# Patient Record
Sex: Male | Born: 1960 | Race: White | Hispanic: No | Marital: Married | Smoking: Never smoker
Health system: Southern US, Community
[De-identification: ages and names within clinical notes are randomized; demographics above are authoritative.]

## PROBLEM LIST (undated history)

## (undated) DIAGNOSIS — N209 Urinary calculus, unspecified: Secondary | ICD-10-CM

## (undated) DIAGNOSIS — J302 Other seasonal allergic rhinitis: Secondary | ICD-10-CM

## (undated) DIAGNOSIS — E785 Hyperlipidemia, unspecified: Secondary | ICD-10-CM

## (undated) DIAGNOSIS — I10 Essential (primary) hypertension: Secondary | ICD-10-CM

## (undated) DIAGNOSIS — G4730 Sleep apnea, unspecified: Secondary | ICD-10-CM

## (undated) DIAGNOSIS — M545 Low back pain, unspecified: Secondary | ICD-10-CM

## (undated) DIAGNOSIS — T7840XA Allergy, unspecified, initial encounter: Secondary | ICD-10-CM

## (undated) DIAGNOSIS — L723 Sebaceous cyst: Secondary | ICD-10-CM

## (undated) DIAGNOSIS — N200 Calculus of kidney: Secondary | ICD-10-CM

## (undated) DIAGNOSIS — T884XXA Failed or difficult intubation, initial encounter: Secondary | ICD-10-CM

## (undated) DIAGNOSIS — R7303 Prediabetes: Secondary | ICD-10-CM

## (undated) DIAGNOSIS — Z98811 Dental restoration status: Secondary | ICD-10-CM

## (undated) HISTORY — DX: Essential (primary) hypertension: I10

## (undated) HISTORY — DX: Prediabetes: R73.03

## (undated) HISTORY — DX: Allergy, unspecified, initial encounter: T78.40XA

## (undated) HISTORY — PX: COLONOSCOPY: SHX174

## (undated) HISTORY — PX: POLYPECTOMY: SHX149

## (undated) HISTORY — DX: Low back pain: M54.5

## (undated) HISTORY — PX: OTHER SURGICAL HISTORY: SHX169

## (undated) HISTORY — DX: Urinary calculus, unspecified: N20.9

## (undated) HISTORY — PX: CYST EXCISION: SHX5701

## (undated) HISTORY — PX: ADENOIDECTOMY: SHX5191

## (undated) HISTORY — DX: Low back pain, unspecified: M54.50

## (undated) HISTORY — DX: Calculus of kidney: N20.0

---

## 2000-11-10 ENCOUNTER — Ambulatory Visit (HOSPITAL_COMMUNITY): Admission: RE | Admit: 2000-11-10 | Discharge: 2000-11-10 | Payer: Self-pay | Admitting: *Deleted

## 2001-03-30 ENCOUNTER — Ambulatory Visit (HOSPITAL_BASED_OUTPATIENT_CLINIC_OR_DEPARTMENT_OTHER): Admission: RE | Admit: 2001-03-30 | Discharge: 2001-03-30 | Payer: Self-pay | Admitting: Internal Medicine

## 2005-01-09 ENCOUNTER — Encounter: Admission: RE | Admit: 2005-01-09 | Discharge: 2005-04-09 | Payer: Self-pay | Admitting: Family Medicine

## 2006-12-27 ENCOUNTER — Ambulatory Visit: Payer: Self-pay | Admitting: Family Medicine

## 2006-12-27 LAB — CONVERTED CEMR LAB
Cholesterol: 186 mg/dL (ref 0–200)
Glucose, Bld: 95 mg/dL (ref 70–99)
HDL: 39 mg/dL (ref >39.0)
LDL Cholesterol: 119 mg/dL — ABNORMAL HIGH (ref 0–99)

## 2008-02-21 ENCOUNTER — Telehealth (INDEPENDENT_AMBULATORY_CARE_PROVIDER_SITE_OTHER): Payer: Self-pay | Admitting: *Deleted

## 2008-04-26 ENCOUNTER — Encounter: Payer: Self-pay | Admitting: Internal Medicine

## 2008-05-03 ENCOUNTER — Ambulatory Visit: Payer: Self-pay | Admitting: Internal Medicine

## 2008-05-03 DIAGNOSIS — K649 Unspecified hemorrhoids: Secondary | ICD-10-CM | POA: Insufficient documentation

## 2008-05-22 ENCOUNTER — Encounter: Payer: Self-pay | Admitting: Internal Medicine

## 2008-05-23 ENCOUNTER — Telehealth: Payer: Self-pay | Admitting: Internal Medicine

## 2008-06-05 ENCOUNTER — Ambulatory Visit: Payer: Self-pay | Admitting: Internal Medicine

## 2008-06-05 DIAGNOSIS — E785 Hyperlipidemia, unspecified: Secondary | ICD-10-CM | POA: Insufficient documentation

## 2008-06-05 DIAGNOSIS — G4733 Obstructive sleep apnea (adult) (pediatric): Secondary | ICD-10-CM | POA: Insufficient documentation

## 2008-06-05 DIAGNOSIS — F329 Major depressive disorder, single episode, unspecified: Secondary | ICD-10-CM

## 2008-06-07 ENCOUNTER — Ambulatory Visit: Payer: Self-pay | Admitting: Internal Medicine

## 2008-06-11 ENCOUNTER — Encounter (INDEPENDENT_AMBULATORY_CARE_PROVIDER_SITE_OTHER): Payer: Self-pay | Admitting: *Deleted

## 2008-06-11 LAB — CONVERTED CEMR LAB
ALT: 24 units/L (ref 0–53)
AST: 19 units/L (ref 0–37)
Basophils Absolute: 0 10*3/uL (ref 0.0–0.1)
CO2: 30 meq/L (ref 19–32)
Chloride: 105 meq/L (ref 96–112)
Cholesterol: 225 mg/dL (ref 0–200)
HDL: 37.1 mg/dL — ABNORMAL LOW (ref >39.0)
Lymphocytes Relative: 34.1 % (ref 12.0–46.0)
MCHC: 33.9 g/dL (ref 30.0–36.0)
Neutrophils Relative %: 53.9 % (ref 43.0–77.0)
Potassium: 4.5 meq/L (ref 3.5–5.1)
RBC: 5.01 M/uL (ref 4.22–5.81)
RDW: 12.8 % (ref 11.5–14.6)
Sodium: 141 meq/L (ref 135–145)
TSH: 1.43 microintl units/mL (ref 0.35–5.50)
VLDL: 51 mg/dL — ABNORMAL HIGH (ref 0–40)

## 2008-06-15 ENCOUNTER — Ambulatory Visit: Payer: Self-pay | Admitting: *Deleted

## 2008-06-29 ENCOUNTER — Ambulatory Visit: Payer: Self-pay | Admitting: *Deleted

## 2008-07-04 ENCOUNTER — Encounter: Payer: Self-pay | Admitting: Internal Medicine

## 2008-07-06 ENCOUNTER — Ambulatory Visit: Payer: Self-pay | Admitting: *Deleted

## 2008-07-13 ENCOUNTER — Ambulatory Visit: Payer: Self-pay | Admitting: *Deleted

## 2008-07-27 ENCOUNTER — Ambulatory Visit: Payer: Self-pay | Admitting: *Deleted

## 2008-07-30 ENCOUNTER — Ambulatory Visit: Payer: Self-pay | Admitting: Internal Medicine

## 2008-08-03 ENCOUNTER — Ambulatory Visit: Payer: Self-pay | Admitting: *Deleted

## 2008-08-10 ENCOUNTER — Ambulatory Visit: Payer: Self-pay | Admitting: *Deleted

## 2008-08-24 ENCOUNTER — Ambulatory Visit: Payer: Self-pay | Admitting: *Deleted

## 2008-08-31 ENCOUNTER — Ambulatory Visit: Payer: Self-pay | Admitting: *Deleted

## 2008-09-07 ENCOUNTER — Ambulatory Visit: Payer: Self-pay | Admitting: *Deleted

## 2008-09-24 ENCOUNTER — Ambulatory Visit: Payer: Self-pay | Admitting: Internal Medicine

## 2009-02-09 ENCOUNTER — Ambulatory Visit: Payer: Self-pay | Admitting: Internal Medicine

## 2009-02-09 LAB — CONVERTED CEMR LAB: Rapid Strep: NEGATIVE

## 2010-03-25 ENCOUNTER — Ambulatory Visit: Payer: Self-pay | Admitting: Internal Medicine

## 2010-08-22 ENCOUNTER — Ambulatory Visit: Payer: Self-pay | Admitting: Internal Medicine

## 2010-08-26 ENCOUNTER — Ambulatory Visit: Payer: Self-pay | Admitting: Internal Medicine

## 2010-08-29 ENCOUNTER — Ambulatory Visit: Payer: Self-pay | Admitting: Internal Medicine

## 2010-08-29 LAB — CONVERTED CEMR LAB
Eosinophils Relative: 2 % (ref 0.0–5.0)
HCT: 42.9 % (ref 39.0–52.0)
Lymphocytes Relative: 30.7 % (ref 12.0–46.0)
Lymphs Abs: 1.6 10*3/uL (ref 0.7–4.0)
Monocytes Relative: 8.5 % (ref 3.0–12.0)
Platelets: 279 10*3/uL (ref 150.0–400.0)
WBC: 5.3 10*3/uL (ref 4.5–10.5)

## 2010-09-02 LAB — CONVERTED CEMR LAB
AST: 17 units/L (ref 0–37)
CO2: 25 meq/L (ref 19–32)
GFR calc non Af Amer: 85.05 mL/min (ref >60)
Glucose, Bld: 101 mg/dL — ABNORMAL HIGH (ref 70–99)
Potassium: 4.7 meq/L (ref 3.5–5.1)
Sodium: 141 meq/L (ref 135–145)
TSH: 1.72 microintl units/mL (ref 0.35–5.50)
VLDL: 20.8 mg/dL (ref 0.0–40.0)

## 2010-12-17 ENCOUNTER — Ambulatory Visit
Admission: RE | Admit: 2010-12-17 | Discharge: 2010-12-17 | Payer: Self-pay | Source: Home / Self Care | Attending: Internal Medicine | Admitting: Internal Medicine

## 2010-12-17 DIAGNOSIS — H6590 Unspecified nonsuppurative otitis media, unspecified ear: Secondary | ICD-10-CM | POA: Insufficient documentation

## 2011-01-06 NOTE — Assessment & Plan Note (Signed)
Summary: cpx/cbs   Vital Signs:  Patient profile:   50 year old male Height:      72 inches Weight:      256.13 pounds Pulse rate:   73 / minute Pulse rhythm:   regular BP sitting:   130 / 76  (left arm) Cuff size:   large  Vitals Entered By: Army Fossa CMA (August 22, 2010 1:43 PM) CC: CPX. not fasting Comments will get TD and flu shot  [facility] [address]    History of Present Illness: CPX  Start a diet program, supervised by a physician at another facility Taking HCG and phentermine doing well Has lost approximately 40 pounds He is also exercising more, daily, bikes during the weekends  Current Medications (verified): 1)  Cpap Supplies .... Dx 780.53 2)  Mvi, Fish Oil, Asa 81 3)  Fluticasone Propionate 50 Mcg/act Susp (Fluticasone Propionate) .[name].. 1 Spray Two Times A Day As Needed  Allergies (verified): No Known Drug Allergies  Past History:  Past Medical History: UNSPECIFIED SLEEP APNEA per pt: sleep study (Dr Shelle Iron)--- on CPAP  HEMORRHOIDS Hyperlipidemia Depression  Past Surgical History: Reviewed history from 06/05/2008 and no changes required. Vasectomy  Family History: Mother 51 - htn, cervical cancer Father 9 - renal dz Brother - leukemia kidney cancer-- F (surgery 2010)  DM - no CAD - no stroke - no colon Ca - m. aunt prostate Ca - no  Social History: married 3 children tobacco--no ETOH-- socially diet exercise-- see HPI Occupation: Chartered loss adjuster, [employer]  Occupation:  employed  Review of Systems CV:  Denies chest pain or discomfort and swelling of feet. Resp:  Denies cough and shortness of breath. GI:  Denies bloody stools, diarrhea, nausea, and vomiting. GU:  Denies dysuria, hematuria, urinary frequency, and urinary hesitancy. Psych:  Denies anxiety and depression.  Physical Exam  General:  alert, well-developed, and well-nourished.   Lungs:  normal respiratory effort, no intercostal retractions, no  accessory muscle use, and normal breath sounds.   Heart:  normal rate, regular rhythm, no murmur, and no gallop.   Abdomen:  soft, non-tender, no distention, no masses, no guarding, and no rigidity.   Extremities:  no edema Psych:  Cognition and judgment appear intact. Alert and cooperative with normal attention span and concentration.  not anxious appearing and not depressed appearing.     Impression & Recommendations:  Problem # 1:  HEALTH SCREENING (ICD-V70.0) Td today  Flu shot today  Mat. Aunt had colon Ca.Marland KitchenMarland KitchenMarland KitchenColonoscopy: aprox  12/07/2002,  normal . Next at age 25 per pt  praised for his change in diet and increase exercise Labs  Complete Medication List: 1)  Cpap Supplies  .... Dx 780.53 2)  Mvi, Fish Oil, Asa 81  3)  Fluticasone Propionate 50 Mcg/act Susp (Fluticasone propionate) .[name].. 1 spray two times a day as needed  Other Orders: Tdap => 65yrs IM (19622) Admin 1st Vaccine (29798) Admin 1st Vaccine (92119) Flu Vaccine 35yrs + [phone])  Patient Instructions: 1)  come back fasting: 2)  FLP CBC BMP AST ALT TSH--- v70 3)  Please schedule a follow-up appointment in 1 year.    Immunizations Administered:  Tetanus Vaccine:    Vaccine Type: Tdap    Site: left deltoid    Mfr: GlaxoSmithKline    Dose: 0.5 ml    Route: IM    Given by: Army Fossa CMA    Exp. Date: 08/27/2012    Lot #: [id number] Flu Vaccine Consent Questions  Do you have a history of severe allergic reactions to this vaccine? no    Any prior history of allergic reactions to egg and/or gelatin? no    Do you have a sensitivity to the preservative Thimersol? no    Do you have a past history of Guillan-Barre Syndrome? no    Do you currently have an acute febrile illness? no    Have you ever had a severe reaction to latex? no    Vaccine information given and explained to patient? yes    Are you currently pregnant? no    Lot Number:[id number]   Exp Date:06/06/2011   Site Given  Right Deltoid IM:  [id number]   .lbflu

## 2011-01-06 NOTE — Assessment & Plan Note (Signed)
Summary: bronchitis//lch   Vital Signs:  Patient profile:   50 year old male Weight:      300 pounds BMI:     40.83 Temp:     98.3 degrees F oral Pulse rate:   64 / minute Resp:     15 per minute BP sitting:   126 / 78  (left arm) Cuff size:   large  Vitals Entered By: Shonna Chock (March 25, 2010 3:04 PM) CC: ? Bronchitis Comments REVIEWED MED LIST, PATIENT AGREED DOSE AND INSTRUCTION CORRECT    CC:  ? Bronchitis.  History of Present Illness: Onset 10-12 days ago as chest "cold" . After air travel to [place] it moved to head. Now major symptom is intermittently productive cough with yellow- brown - green  phlegm. Rx: Mucinex, Neti pot   Allergies (verified): No Known Drug Allergies  Review of Systems General:  Denies chills, fever, and sweats. ENT:  Complains of sinus pressure; No frontal headache & facial pain w/o purulence. Resp:  Complains of shortness of breath and wheezing; denies chest pain with inspiration and coughing up blood; No PMH of asthma; non smoker. Allergy:  Denies itching eyes and sneezing.  Physical Exam  General:  well-nourished,in no acute distress; alert,appropriate and cooperative throughout examination Ears:  External ear exam shows no significant lesions or deformities.  Otoscopic examination reveals clear canals, tympanic membranes are intact bilaterally without bulging, retraction, inflammation or discharge. Hearing is grossly normal bilaterally. Nose:  External nasal examination shows no deformity or inflammation. Nasal mucosa are  dry without lesions or exudates. Hyponasal speech Mouth:  Oral mucosa and oropharynx without lesions or exudates.  Teeth in good repair. No  pharyngeal erythema.  Slightly hoarse Lungs:  Normal respiratory effort, chest expands symmetrically. Lungs are clear to auscultation, no crackles or wheezes. Heart:  Normal rate and regular rhythm. S1 and S2 normal without gallop, murmur, click, rub.S4 Cervical Nodes:  No  lymphadenopathy noted Axillary Nodes:  No palpable lymphadenopathy   Impression & Recommendations:  Problem # 1:  BRONCHITIS-ACUTE (ICD-466.0)  The following medications were removed from the medication list:    Penicillin V Potassium 500 Mg Tabs (Penicillin v potassium) .[name]... 1 two times a day for strep infection His updated medication list for this problem includes:    Cefuroxime Axetil 500 Mg Tabs (Cefuroxime axetil) .[name]... 1 two times a day    Promethazine Vc/codeine 6.25-5-10 Mg/28ml Syrp (Phenyleph-promethazine-cod) .[name]... 1 tsp every 6 hrs as needed  Problem # 2:  URI (ICD-465.9)  His updated medication list for this problem includes:    Promethazine Vc/codeine 6.25-5-10 Mg/28ml Syrp (Phenyleph-promethazine-cod) .[name]... 1 tsp every 6 hrs as needed  Complete Medication List: 1)  Cpap Supplies  .... Dx 780.53 2)  Mvi, Fish Oil, Asa 81  3)  Cefuroxime Axetil 500 Mg Tabs (Cefuroxime axetil) .[name].. 1 two times a day 4)  Promethazine Vc/codeine 6.25-5-10 Mg/64ml Syrp (Phenyleph-promethazine-cod) .[name].. 1 tsp every 6 hrs as needed  Patient Instructions: 1)  Continue Neti pot once daily until sinuses are clear. 2)  Drink as much fluid as you can tolerate for the next few days. Prescriptions: PROMETHAZINE VC/CODEINE 6.25-5-10 MG/5ML SYRP (PHENYLEPH-PROMETHAZINE-COD) 1 tsp every 6 hrs as needed  #120cc x 0   Entered and Authorized by:   Marga Melnick MD   Signed by:   Marga Melnick MD on 03/25/2010   Method used:   Printed then faxed to ...       [facility] [id number] (retail)  [address]       [place], [place]  [address]       Ph: [phone]       Fax: [phone]   RxID:   [id number] CEFUROXIME AXETIL 500 MG TABS (CEFUROXIME AXETIL) 1 two times a day  #20 x 0   Entered and Authorized by:   Marga Melnick MD   Signed by:   Marga Melnick MD on 03/25/2010   Method used:   Faxed to ...       [id number]*  (retail)       [address]       [place], [place]  [address]       Ph: [phone]       Fax: [phone]   RxID:   [id number]

## 2011-01-08 NOTE — Assessment & Plan Note (Signed)
Summary: clogged ear//lch   Vital Signs:  Patient profile:   50 year old male Weight:      264.13 pounds Pulse rate:   82 / minute Pulse rhythm:   regular BP sitting:   124 / 92  (left arm) Cuff size:   large  Vitals Entered By: Army Fossa CMA (December 17, 2010 1:52 PM) CC: Pt here c/o head cold last week, has been flying. Ear feels "plugged" up.  Comments no pain [facility] [address]    History of Present Illness: had a URI last week, symptoms slightly better today but he has a lot of right ear congestion.  ROS No fever No cough or chest congestion He still has a low of nasal discharge, brown in color Denies ear pain or ear discharge     Current Medications (verified): 1)  Cpap Supplies .... Dx 780.53 2)  Mvi, Fish Oil, Asa 81 3)  Fluticasone Propionate 50 Mcg/act Susp (Fluticasone Propionate) .[name].. 1 Spray Two Times A Day As Needed  Allergies (verified): No Known Drug Allergies  Past History:  Past Medical History: Reviewed history from 08/22/2010 and no changes required. UNSPECIFIED SLEEP APNEA per pt: sleep study (Dr Shelle Iron)--- on CPAP  HEMORRHOIDS Hyperlipidemia Depression  Past Surgical History: Reviewed history from 06/05/2008 and no changes required. Vasectomy  Physical Exam  General:  alert and well-developed.   Head:  face symmetric, not tender to palpation Ears:  R TMs slightly bulged, pink, + air fluid level and L TMs slightly bulged Nose:  moderately congested Mouth:  no redness or discharge Lungs:  normal respiratory effort, no intercostal retractions, no accessory muscle use, and normal breath sounds.     Impression & Recommendations:  Problem # 1:  OTITIS MEDIA, SEROUS (ICD-381.4) see instructions   Complete Medication List: 1)  Cpap Supplies  .... Dx 780.53 2)  Mvi, Fish Oil, Asa 81  3)  Fluticasone Propionate 50 Mcg/act Susp (Fluticasone propionate) .[name].. 1 spray two times a day as needed 4)  Amoxicillin 500 Mg Tabs (Amoxicillin)  .... 2 tablets twice a day 5)  Prednisone 10 Mg Tabs (Prednisone) .... 4 by mouth once daily x 2, 3x2,2x2,1x2  Patient Instructions: 1)  take the medications as prescribed 2)  Mucinex twice a day for one week 3)  Sudafed 10 mg -- behind the counter-- one tablets 3 times a day for 3 or 4 days 4)  Call i  if no better next week Prescriptions: PREDNISONE 10 MG TABS (PREDNISONE) 4 by mouth once daily x 2, 3x2,2x2,1x2  #20 x 0   Entered and Authorized by:   Elita Quick E. Jaryan Chicoine MD   Signed by:   Nolon Rod. Denzel Etienne MD on 12/17/2010   Method used:   Print then Give to Patient   RxID:   [id number] AMOXICILLIN 500 MG TABS (AMOXICILLIN) 2 tablets twice a day  #28 x 0   Entered and Authorized by:   Nolon Rod. Meeya Goldin MD   Signed by:   Nolon Rod. Bandon Sherwin MD on 12/17/2010   Method used:   Print then Give to Patient   RxID:   [id number]    Orders Added: 1)  Est. Patient Level III [15176]

## 2011-04-24 NOTE — Procedures (Signed)
[place]  Patient:    DENIM, KALMBACH                    MRN: [id number] Proc. Date: 11/10/00 Adm. Date:  [id number] Attending:  Sabino Gasser                           Procedure Report  PROCEDURE PERFORMED:  Colonoscopy.  ENDOSCOPIST:  Sabino Gasser, M.D.  INDICATIONS FOR PROCEDURE:  Rectal bleeding, strong family history of colon cancer.  ANESTHESIA:  Demerol 75 mg, Versed 10 mg.  DESCRIPTION OF PROCEDURE:  With the patient mildly sedated in the left lateral decubitus position, the Olympus videoscopic colonoscope was inserted in the rectum after normal rectal examination and passed under direct vision into the cecum.  The cecum was identified by the ileocecal valve and appendiceal orifice, both of which were photographed.  The prep was slightly suboptimal in that there were areas of liquid stool with semisolid material that appeared to be corn kernels that could not be fully suctioned and therefore, we did the best we could at clearing the colon of the fecal debris.  From this point, the colonoscope was slowly withdrawn, taking circumferential views of the entire colonic mucosa stopping in the rectum, which appeared normal on direct and retroflex view.  The endoscope was straightened and withdrawn.  Patients vital signs and pulse oximeter remained stable.  The patient tolerated the procedure well and without apparent complications.  FINDINGS:  Essentially normal colonoscopic examination to cecum.  PLAN:  Repeat examination in approximately five years. DD:  11/10/00 TD:  11/10/00 Job: [id number]

## 2011-04-24 NOTE — Assessment & Plan Note (Signed)
[facility]                        [facility] [facility] OFFICE NOTE   NAME:Bryan [name], Bryan [name]                      MRN:          [id number]  DATE:12/27/2006                            DOB:          [birth year]    REASON FOR VISIT:  Establish care.   HISTORY OF PRESENT ILLNESS:  Mr. Morison is a 50 year old male who is  establishing care from [facility].  He has no  significant complaints today.  He appears to have a history of  hyperlipidemia and allergies.  He has been on Lescol XL 80 mg daily  until the beginning of the year.  He reports that he has changed his  lifestyle significantly.  He is working out 5-6 times a week, about an  hour a day.  He has lost about 18 pounds since the beginning of the  year.  He wants to continue being off Lescol if at all possible.  He  would like his lipid profile checked today to see if that is a  possibility.  He feels that if it is borderline or low, he would prefer  to stay off the medications.   The patient also has a small lump at the base of his neck that he wanted  me to take a look at.  He has a history of a sebaceous cyst, he just  wanted to make sure that it was not concerning.  It is not symptomatic  in nature.  He states it has not grown in size.   PAST MEDICAL HISTORY:  1. Hyperlipidemia.  2. Allergic rhinitis.   PAST SURGICAL HISTORY:  1. Vasectomy on February 17, 2005.   MEDICATIONS:  None, but use of Lescol XL 80 mg daily.   ALLERGIES:  No drug allergies.   FAMILY HISTORY:  Father alive with no significant medical history.  Mother alive with history of hypertension and uterine cancer.  Brother  alive with history of ischemia.  He has an aunt on mother's side that  has colon cancer.   SOCIAL HISTORY:  The patient is a nonsmoker and drinks alcohol rarely.  He is a Environmental consultant, married.   REVIEW OF SYSTEMS:  As per HPI.  Otherwise, unremarkable.   HEALTH MAINTENANCE:  He  had a colonoscopy in 2000 given his family  history that was negative per patient.   OBJECTIVE:  VITAL SIGNS:  Weight 283, pulse 76, blood pressure 126/80.  GENERAL:  Pleasant, no acute distress.  Pleasant and appropriate.  Alert  and oriented x3.  NECK:  Supple.  No lymphadenopathy, carotid bruits or JVD.  He does have  a 1-1/2 2 cm subcutaneous cystic nodule at the base of the neck, easily  movable.  LUNGS:  Clear.  HEART:  Regular rate and rhythm, normal S1, S2, no murmurs, gallops,  rubs.   IMPRESSION:  1. Hyperlipidemia, currently diet controlled with exercise.  2. Subcutaneous nodules consistent with an epidermal or sebaceous      cyst.   PLAN:  1. Congratulate the patient with lifestyle changes.  I advise patient      to  continue.  2. Will check a lipid profile, AST and ALT as well as a fasting      glucose.  3. Further recommendations after we review the above.  4. In regards to the subcutaneous nodule, it appears benign, but to      have 100% certainty, excision would have to be done.  The patient      prefers to monitor for change in size or pain.  5. Patient to follow up in June for complete physical examination.     Leanne Chang, M.D.  Electronically Signed    LA/MedQ  DD: 12/27/2006  DT: 12/27/2006  Job #: [id number]

## 2012-01-11 ENCOUNTER — Inpatient Hospital Stay (HOSPITAL_BASED_OUTPATIENT_CLINIC_OR_DEPARTMENT_OTHER)
Admission: EM | Admit: 2012-01-11 | Discharge: 2012-01-14 | DRG: 494 | Disposition: A | Discharge status: Home / Self Care | Payer: BC Managed Care – PPO | Attending: Surgery | Admitting: Surgery

## 2012-01-11 ENCOUNTER — Emergency Department (INDEPENDENT_AMBULATORY_CARE_PROVIDER_SITE_OTHER): Payer: BC Managed Care – PPO

## 2012-01-11 ENCOUNTER — Encounter (INDEPENDENT_AMBULATORY_CARE_PROVIDER_SITE_OTHER): Payer: Self-pay | Admitting: General Surgery

## 2012-01-11 ENCOUNTER — Encounter (HOSPITAL_BASED_OUTPATIENT_CLINIC_OR_DEPARTMENT_OTHER): Payer: Self-pay

## 2012-01-11 DIAGNOSIS — K8000 Calculus of gallbladder with acute cholecystitis without obstruction: Principal | ICD-10-CM | POA: Diagnosis present

## 2012-01-11 DIAGNOSIS — E785 Hyperlipidemia, unspecified: Secondary | ICD-10-CM | POA: Diagnosis present

## 2012-01-11 DIAGNOSIS — K8020 Calculus of gallbladder without cholecystitis without obstruction: Secondary | ICD-10-CM

## 2012-01-11 DIAGNOSIS — R1013 Epigastric pain: Secondary | ICD-10-CM

## 2012-01-11 DIAGNOSIS — F329 Major depressive disorder, single episode, unspecified: Secondary | ICD-10-CM | POA: Diagnosis present

## 2012-01-11 DIAGNOSIS — G4730 Sleep apnea, unspecified: Secondary | ICD-10-CM | POA: Diagnosis present

## 2012-01-11 DIAGNOSIS — F3289 Other specified depressive episodes: Secondary | ICD-10-CM | POA: Diagnosis present

## 2012-01-11 DIAGNOSIS — R945 Abnormal results of liver function studies: Secondary | ICD-10-CM

## 2012-01-11 DIAGNOSIS — R1011 Right upper quadrant pain: Secondary | ICD-10-CM

## 2012-01-11 DIAGNOSIS — K7689 Other specified diseases of liver: Secondary | ICD-10-CM

## 2012-01-11 DIAGNOSIS — R112 Nausea with vomiting, unspecified: Secondary | ICD-10-CM

## 2012-01-11 HISTORY — DX: Hyperlipidemia, unspecified: E78.5

## 2012-01-11 HISTORY — DX: Sleep apnea, unspecified: G47.30

## 2012-01-11 LAB — COMPREHENSIVE METABOLIC PANEL
AST: 232 U/L — ABNORMAL HIGH (ref 0–37)
Albumin: 4.1 g/dL (ref 3.5–5.2)
Alkaline Phosphatase: 61 U/L (ref 39–117)
BUN: 13 mg/dL (ref 6–23)
CO2: 26 mEq/L (ref 19–32)
Chloride: 102 mEq/L (ref 96–112)
Creatinine, Ser: 0.9 mg/dL (ref 0.50–1.35)
GFR calc non Af Amer: >90 mL/min (ref >90)
Potassium: 3.8 mEq/L (ref 3.5–5.1)
Total Bilirubin: 1.1 mg/dL (ref 0.3–1.2)

## 2012-01-11 LAB — URINE MICROSCOPIC-ADD ON: RBC / HPF: NONE SEEN RBC/hpf (ref <3)

## 2012-01-11 LAB — URINALYSIS, ROUTINE W REFLEX MICROSCOPIC
Glucose, UA: NEGATIVE mg/dL
Hgb urine dipstick: NEGATIVE
Ketones, ur: NEGATIVE mg/dL
Protein, ur: 30 mg/dL — AB
Urobilinogen, UA: 1 mg/dL (ref 0.0–1.0)

## 2012-01-11 LAB — CBC
HCT: 41 % (ref 39.0–52.0)
MCV: 82 fL (ref 78.0–100.0)
RBC: 5 MIL/uL (ref 4.22–5.81)
RDW: 13.5 % (ref 11.5–15.5)
WBC: 8 10*3/uL (ref 4.0–10.5)

## 2012-01-11 MED ORDER — IOHEXOL 300 MG/ML  SOLN
100.0000 mL | Freq: Once | INTRAMUSCULAR | Status: AC | PRN
  Administered 2012-01-11: 100 mL via INTRAVENOUS

## 2012-01-11 MED ORDER — ONDANSETRON HCL 4 MG/2ML IJ SOLN
4.0000 mg | Freq: Once | INTRAMUSCULAR | Status: DC
  Filled 2012-01-11: qty 2

## 2012-01-11 MED ORDER — HYDROMORPHONE HCL PF 2 MG/ML IJ SOLN
2.0000 mg | Freq: Once | INTRAMUSCULAR | Status: AC
  Administered 2012-01-11: 2 mg via INTRAVENOUS
  Filled 2012-01-11: qty 1

## 2012-01-11 MED ORDER — ONDANSETRON HCL 4 MG/2ML IJ SOLN
4.0000 mg | Freq: Once | INTRAMUSCULAR | Status: AC
  Administered 2012-01-11: 4 mg via INTRAVENOUS

## 2012-01-11 MED ORDER — SODIUM CHLORIDE 0.9 % IV BOLUS (SEPSIS)
1000.0000 mL | Freq: Once | INTRAVENOUS | Status: AC
  Administered 2012-01-11: 1000 mL via INTRAVENOUS

## 2012-01-11 MED ORDER — MORPHINE SULFATE 4 MG/ML IJ SOLN
INTRAMUSCULAR | Status: AC
  Administered 2012-01-11: 4 mg via INTRAVENOUS
  Filled 2012-01-11: qty 1

## 2012-01-11 MED ORDER — HYDROMORPHONE HCL PF 1 MG/ML IJ SOLN
INTRAMUSCULAR | Status: AC
  Administered 2012-01-11: 1 mg via INTRAVENOUS
  Filled 2012-01-11: qty 1

## 2012-01-11 MED ORDER — ONDANSETRON HCL 4 MG/2ML IJ SOLN
INTRAMUSCULAR | Status: AC
  Filled 2012-01-11: qty 2

## 2012-01-11 MED ORDER — MORPHINE SULFATE 2 MG/ML IJ SOLN
2.0000 mg | INTRAMUSCULAR | Status: DC | PRN
  Administered 2012-01-11: 2 mg via INTRAVENOUS
  Administered 2012-01-12: 4 mg via INTRAVENOUS
  Administered 2012-01-12: 2 mg via INTRAVENOUS
  Administered 2012-01-12: 4 mg via INTRAVENOUS
  Filled 2012-01-11: qty 2
  Filled 2012-01-11 (×2): qty 1
  Filled 2012-01-11: qty 2

## 2012-01-11 MED ORDER — HYDROMORPHONE HCL PF 1 MG/ML IJ SOLN
1.0000 mg | Freq: Once | INTRAMUSCULAR | Status: AC
  Administered 2012-01-11: 1 mg via INTRAVENOUS
  Filled 2012-01-11: qty 1

## 2012-01-11 MED ORDER — ONDANSETRON HCL 4 MG/2ML IJ SOLN
4.0000 mg | Freq: Four times a day (QID) | INTRAMUSCULAR | Status: DC | PRN
  Administered 2012-01-11 – 2012-01-13 (×5): 4 mg via INTRAVENOUS
  Filled 2012-01-11 (×5): qty 2

## 2012-01-11 MED ORDER — SODIUM CHLORIDE 0.9 % IV BOLUS (SEPSIS)
500.0000 mL | Freq: Once | INTRAVENOUS | Status: AC
  Administered 2012-01-11: 1000 mL via INTRAVENOUS

## 2012-01-11 MED ORDER — MORPHINE SULFATE 4 MG/ML IJ SOLN
4.0000 mg | Freq: Once | INTRAMUSCULAR | Status: AC
  Administered 2012-01-11: 4 mg via INTRAVENOUS

## 2012-01-11 MED ORDER — SODIUM CHLORIDE 0.9 % IV SOLN
1.0000 g | INTRAVENOUS | Status: DC
  Administered 2012-01-12 – 2012-01-14 (×3): 1 g via INTRAVENOUS
  Filled 2012-01-11 (×4): qty 1

## 2012-01-11 MED ORDER — HYDROMORPHONE HCL PF 1 MG/ML IJ SOLN
1.0000 mg | Freq: Once | INTRAMUSCULAR | Status: AC
  Administered 2012-01-11: 1 mg via INTRAVENOUS

## 2012-01-11 MED ORDER — POTASSIUM CHLORIDE IN NACL 20-0.9 MEQ/L-% IV SOLN
INTRAVENOUS | Status: DC
  Administered 2012-01-11: via INTRAVENOUS
  Administered 2012-01-12: 125 mL/h via INTRAVENOUS
  Filled 2012-01-11 (×5): qty 1000

## 2012-01-11 MED ORDER — HYDROMORPHONE HCL PF 1 MG/ML IJ SOLN
1.0000 mg | Freq: Once | INTRAMUSCULAR | Status: AC
  Administered 2012-01-11 (×2): 1 mg via INTRAVENOUS
  Filled 2012-01-11: qty 1

## 2012-01-11 MED ORDER — PANTOPRAZOLE SODIUM 40 MG IV SOLR
40.0000 mg | Freq: Every day | INTRAVENOUS | Status: DC
  Administered 2012-01-12 – 2012-01-13 (×2): 40 mg via INTRAVENOUS
  Filled 2012-01-11 (×4): qty 40

## 2012-01-11 MED ORDER — IOHEXOL 300 MG/ML  SOLN
20.0000 mL | INTRAMUSCULAR | Status: DC | PRN
  Administered 2012-01-11: 20 mL via ORAL

## 2012-01-11 NOTE — ED Notes (Signed)
Pt reports nausea after CT scan and vomited emesis.  States he feels better now.

## 2012-01-11 NOTE — ED Notes (Signed)
Pt presenting to ed with c/o abdominal pain, nausea and vomiting pt presenting to ed via [facility] from [facility]. Per [facility] pt coming to [place] for hiata scan. Pt is alert and oriented at this time.

## 2012-01-11 NOTE — ED Notes (Signed)
PO fluids provided. 

## 2012-01-11 NOTE — H&P (Signed)
Bryan [name] is an 51 y.o. male.   Chief Complaint: abdominal pain HPI: patient is a 51 year old male in his usual state of health until late last night when he was awakened with sudden onset of pain across his upper abdomen. He describes pressure-like or cramping pain across his epigastrium and in both upper quadrants.  he took some over-the-counter pain medication and it improved but then he awoke again at 4 this morning with recurrent severe pain and vomited several times. He presented to the [place] emergency room. He has received pain medication which has improved her pain but not completely relieved. He is no longer nauseated but does not feel hungry. He denies any history of any similar episodes or chronic abdominal pain or GI complaints. no fever chills.  No jaundice noted and no urinary symptoms.  Past Medical History  Diagnosis Date  . Hyperlipidemia   . Depression   . Sleep apnea     CPAP    History reviewed. No pertinent past surgical history.  No family history on file. Social History:  reports that he has never smoked. He does not have any smokeless tobacco history on file. He reports that he drinks alcohol. He reports that he does not use illicit drugs.  Allergies: No Known Allergies  Medications Prior to Admission  Medication Dose Route Frequency Provider Last Rate Last Dose  . HYDROmorphone (DILAUDID) injection 1 mg  1 mg Intravenous Once [employer], PA-C   1 mg at 01/11/12 1025  . HYDROmorphone (DILAUDID) injection 1 mg  1 mg Intravenous Once [employer], PA-C      . HYDROmorphone (DILAUDID) injection 1 mg  1 mg Intravenous Once Dayton Bailiff, MD   1 mg at 01/11/12 1821  . HYDROmorphone (DILAUDID) injection 2 mg  2 mg Intravenous Once Shaaron Adler, PA-C   2 mg at 01/11/12 1309  . iohexol (OMNIPAQUE) 300 MG/ML solution 100 mL  100 mL Intravenous Once PRN Medication Radiologist, MD   100 mL at 01/11/12 1402  . iohexol (OMNIPAQUE) 300  MG/ML solution 20 mL  20 mL Oral PRN Medication Radiologist, MD   20 mL at 01/11/12 1320  . morphine 4 MG/ML injection 4 mg  4 mg Intravenous Once Shaaron Adler, PA-C   4 mg at 01/11/12 0941  . ondansetron (ZOFRAN) injection 4 mg  4 mg Intravenous Once Shaaron Adler, PA-C   4 mg at 01/11/12 0941  . ondansetron (ZOFRAN) injection 4 mg  4 mg Intravenous Once Shaaron Adler, PA-C   4 mg at 01/11/12 1325  . sodium chloride 0.9 % bolus 1,000 mL  1,000 mL Intravenous Once Forbes Cellar, MD   1,000 mL at 01/11/12 0900  . sodium chloride 0.9 % bolus 500 mL  500 mL Intravenous Once Forbes Cellar, MD   1,000 mL at 01/11/12 1026   No current outpatient prescriptions on file as of 01/11/2012.    Results for orders placed during the hospital encounter of 01/11/12 (from the past 48 hour(s))  CBC     Status: Normal   Collection Time   01/11/12  8:48 AM      Component Value Range Comment   WBC 8.0  4.0 - 10.5 (K/uL)    RBC 5.00  4.22 - 5.81 (MIL/uL)    Hemoglobin 14.4  13.0 - 17.0 (g/dL)    HCT 16.1  09.6 - 04.5 (%)    MCV 82.0  78.0 - 100.0 (fL)  MCH 28.8  26.0 - 34.0 (pg)    MCHC 35.1  30.0 - 36.0 (g/dL)    RDW 19.1  47.8 - 29.5 (%)    Platelets 254  150 - 400 (K/uL)   COMPREHENSIVE METABOLIC PANEL     Status: Abnormal   Collection Time   01/11/12  8:48 AM      Component Value Range Comment   Sodium 140  135 - 145 (mEq/L)    Potassium 3.8  3.5 - 5.1 (mEq/L)    Chloride 102  96 - 112 (mEq/L)    CO2 26  19 - 32 (mEq/L)    Glucose, Bld 154 (*) 70 - 99 (mg/dL)    BUN 13  6 - 23 (mg/dL)    Creatinine, Ser 6.21  0.50 - 1.35 (mg/dL)    Calcium 9.6  8.4 - 10.5 (mg/dL)    Total Protein 7.5  6.0 - 8.3 (g/dL)    Albumin 4.1  3.5 - 5.2 (g/dL)    AST 308 (*) 0 - 37 (U/L)    ALT 193 (*) 0 - 53 (U/L)    Alkaline Phosphatase 61  39 - 117 (U/L)    Total Bilirubin 1.1  0.3 - 1.2 (mg/dL)    GFR calc non Af Amer >90  >90 (mL/min)    GFR calc Af Amer >90  >90 (mL/min)   LIPASE,  BLOOD     Status: Normal   Collection Time   01/11/12  8:51 AM      Component Value Range Comment   Lipase 20  11 - 59 (U/L)   URINALYSIS, ROUTINE W REFLEX MICROSCOPIC     Status: Abnormal   Collection Time   01/11/12 11:30 AM      Component Value Range Comment   Color, Urine AMBER (*) YELLOW  BIOCHEMICALS MAY BE AFFECTED BY COLOR   APPearance TURBID (*) CLEAR     Specific Gravity, Urine 1.020  1.005 - 1.030     pH 8.0  5.0 - 8.0     Glucose, UA NEGATIVE  NEGATIVE (mg/dL)    Hgb urine dipstick NEGATIVE  NEGATIVE     Bilirubin Urine NEGATIVE  NEGATIVE     Ketones, ur NEGATIVE  NEGATIVE (mg/dL)    Protein, ur 30 (*) NEGATIVE (mg/dL)    Urobilinogen, UA 1.0  0.0 - 1.0 (mg/dL)    Nitrite NEGATIVE  NEGATIVE     Leukocytes, UA NEGATIVE  NEGATIVE    URINE MICROSCOPIC-ADD ON     Status: Normal   Collection Time   01/11/12 11:30 AM      Component Value Range Comment   Squamous Epithelial / LPF RARE  RARE     WBC, UA 0-2  <3 (WBC/hpf)    RBC / HPF    <3 (RBC/hpf)    Value: NO FORMED ELEMENTS SEEN ON URINE MICROSCOPIC EXAMINATION   Bacteria, UA RARE  RARE     Urine-Other AMORPHOUS URATES/PHOSPHATES      US Abdomen Complete  01/11/2012  *RADIOLOGY REPORT*  Clinical Data:  Right upper quadrant pain.  COMPLETE ABDOMINAL ULTRASOUND  Comparison:  None.  Findings:  Gallbladder:  There are multiple small echogenic foci with shadowing present consistent with multiple small gallstones.  The gallbladder wall thickness is normal measuring 2.7 mm in diameter. There is no pericholecystic fluid.  Common bile duct:  Normal caliber measuring 2 mm in diameter.  Liver:  No focal lesion identified.  Within normal limits in parenchymal echogenicity.  IVC:  Appears normal.  Pancreas:  The pancreas is not visualized in this patient due to overlying bowel gas.  Spleen:  The spleen measures 7.2 cm in length.  There are no focal abnormalities.  Right Kidney:  The right kidney measures 12.0 cm in length.  There is no  hydronephrosis and there are no focal abnormalities.  Left Kidney:  The left kidney measures 12.1 cm in length.  There is no hydronephrosis and there are no focal abnormalities.  Abdominal aorta:  There is no aneurysm identified.  There is limited visualization of the abdominal aorta due to overlying gas.  IMPRESSION:  1.  Cholelithiasis with normal gallbladder wall thickness. 2.  Limited visualization of the pancreas and abdominal aorta due to overlying bowel gas.  Original Report Authenticated By: Rolla Plate, M.D.   Ct Abdomen Pelvis W Contrast  01/11/2012  *RADIOLOGY REPORT*  Clinical Data: Severe right upper quadrant abdominal pain  CT ABDOMEN AND PELVIS WITH CONTRAST  Technique:  Multidetector CT imaging of the abdomen and pelvis was performed following the standard protocol during bolus administration of intravenous contrast.  Contrast: 100 ml Omnipaque-300 IV  Comparison: Ultrasound dated 01/11/2012  Findings: Mild dependent atelectasis at the lung bases.  Mild hepatic steatosis.  Spleen, pancreas, and adrenal glands are within normal limits.  Gallbladder is notable for mild pericholecystic inflammatory changes (series 2/image 37).  Known cholelithiasis is not evident on CT.  Kidneys are within normal limits.  No hydronephrosis.  No evidence of bowel obstruction.  Normal appendix.  No evidence of abdominal aortic aneurysm.  No abdominopelvic ascites.  No suspicious abdominopelvic lymphadenopathy.  Prostate is unremarkable.  Bladder is within normal limits.  Fat-containing right inguinal hernia.  Degenerative changes of the visualized thoracolumbar spine.  IMPRESSION: Mild pericholecystic inflammatory changes, worrisome for early acute cholecystitis.  Known cholelithiasis is not evident on CT. Consider hepatobiliary nuclear medicine scan for further evaluation as clinically warranted.  Mild hepatic steatosis.  Original Report Authenticated By: Charline Bills, M.D.    Review of Systems    Constitutional: Negative for fever, chills and weight loss.  Respiratory: Negative for cough, sputum production and shortness of breath.   Cardiovascular: Negative for chest pain and palpitations.  Gastrointestinal: Positive for nausea, vomiting and abdominal pain. Negative for diarrhea, constipation and blood in stool.  Genitourinary: Negative for dysuria, urgency and frequency.  Musculoskeletal: Negative for myalgias and joint pain.  Endo/Heme/Allergies: Does not bruise/bleed easily.    Blood pressure 142/82, pulse 62, resp. rate 18, height 5\' 11"  (1.803 m), weight 295 lb (133.811 kg), SpO2 98.00%. Physical Exam  General: Alert, obese white male, in no distress Skin: Warm and dry without rash or infection. HEENT: No palpable masses or thyromegaly. Sclera nonicteric. Pupils equal round and reactive. Oropharynx clear. Lymph nodes: No cervical, supraclavicular, or inguinal nodes palpable. Lungs: Breath sounds clear and equal without increased work of breathing Cardiovascular: Regular rate and rhythm without murmur. No JVD or edema. Peripheral pulses intact. Abdomen: Nondistended. Moderate tenderness in the right upper quadrant without guarding or peritoneal signs.. No masses palpable. No organomegaly. No palpable hernias. Extremities: No edema or joint swelling or deformity. No chronic venous stasis changes. Neurologic: Alert and fully oriented.   Assessment/Plan Acute right upper quadrant abdominal pain, nausea and vomiting, classic for biliary disease. He has an abdominal ultrasound confirming gallstones and CT scan showing probable early acute cholecystitis. LFTs show moderately elevated transaminases but normal alkaline phosphatase and bilirubin. This likely represents inflammation but common bile duct stone is  a possibility.  The patient will be admitted, made n.p.o., and started on IV antibiotics. We will repeat LFTs tomorrow morning and tentatively plan on proceeding with laparoscopic  cholecystectomy with cholangiogram. This was discussed in detail with the patient and his wife including the indications for the procedure and its nature and recovery and risks of bleeding, infection, anesthetic risks, and slight risks of bile leak or bile duct injury or open procedure.  Sleep apnea. CPAP will be ordered.  Omair Dettmer T 01/11/2012, 7:22 PM

## 2012-01-11 NOTE — ED Notes (Signed)
Report given to [facility]. Pt to be NPO after mn, for surgery to be scheduled after rounds in am. Pt with well controlled pain and nausea at this time. Pt has been up and amb to BR without difficulty this evening.

## 2012-01-11 NOTE — ED Notes (Signed)
MD at bedside. 

## 2012-01-11 NOTE — ED Notes (Signed)
Pt returned from ultrasound

## 2012-01-11 NOTE — ED Notes (Signed)
Pt reports onset of severe abdominal pain and vomiting that started last night.

## 2012-01-11 NOTE — ED Provider Notes (Signed)
History     CSN: [id number]  Arrival date & time 01/11/12  0813   First MD Initiated Contact with Patient 01/11/12 0930      Chief Complaint  Patient presents with  . Abdominal Pain  . Emesis    (Consider location/radiation/quality/duration/timing/severity/associated sxs/prior treatment) HPI Comments: Sent from [facility] for further evaluation of possible chole  Patient is a 51 y.o. male presenting with abdominal pain. The history is provided by the patient. No language interpreter was used.  Abdominal Pain The primary symptoms of the illness include abdominal pain, nausea and vomiting. The primary symptoms of the illness do not include fever, fatigue, shortness of breath, diarrhea or dysuria. The current episode started yesterday. The onset of the illness was sudden. The problem has been gradually worsening.  The abdominal pain began yesterday. The pain came on suddenly. The abdominal pain has been gradually worsening since its onset. The abdominal pain is located in the RUQ. The abdominal pain radiates to the epigastric region. The abdominal pain is relieved by nothing. The abdominal pain is exacerbated by eating.  The vomiting began today. Vomiting occurs 2 to 5 times per day. The emesis contains stomach contents.   Symptoms associated with the illness do not include constipation, urgency, hematuria, frequency or back pain.    History reviewed. No pertinent past medical history.  History reviewed. No pertinent past surgical history.  No family history on file.  History  Substance Use Topics  . Smoking status: Never Smoker   . Smokeless tobacco: Not on file  . Alcohol Use: Yes     occasional      Review of Systems  Constitutional: Negative for fever, activity change, appetite change and fatigue.  HENT: Negative for congestion, sore throat, rhinorrhea, neck pain and neck stiffness.   Respiratory: Negative for shortness of breath.   Cardiovascular: Negative for chest pain and  palpitations.  Gastrointestinal: Positive for nausea, vomiting and abdominal pain. Negative for diarrhea and constipation.  Genitourinary: Negative for dysuria, urgency, frequency, hematuria and flank pain.  Musculoskeletal: Negative for back pain.  Neurological: Negative for dizziness, weakness, light-headedness and numbness.  All other systems reviewed and are negative.    Allergies  Review of patient's allergies indicates no known allergies.  Home Medications   Current Outpatient Rx  Name Route Sig Dispense Refill  . ASPIRIN 81 MG PO TABS Oral Take 81 mg by mouth daily.    . OMEGA-3 FATTY ACIDS 1000 MG PO CAPS Oral Take 1 g by mouth daily.    [name] GREEN COFFEE BEAN 400 MG PO CAPS Oral Take 1 capsule by mouth daily.    . CENTRUM PO Oral Take 1 tablet by mouth daily.    [name] SIMETHICONE 80 MG PO CHEW Oral Chew 160 mg by mouth every 6 (six) hours as needed. For stomach pain      BP 142/82  Pulse 62  Resp 18  Ht 5\' 11"  (1.803 m)  Wt 295 lb (133.811 kg)  BMI 41.14 kg/m2  SpO2 98%  Physical Exam  Nursing note and vitals reviewed. Constitutional: He is oriented to person, place, and time. He appears well-developed and well-nourished. No distress.  HENT:  Head: Normocephalic and atraumatic.  Mouth/Throat: Oropharynx is clear and moist.  Eyes: Conjunctivae and EOM are normal. Pupils are equal, round, and reactive to light.  Neck: Normal range of motion. Neck supple.  Cardiovascular: Normal rate, regular rhythm, normal heart sounds and intact distal pulses.  Exam reveals no gallop and no  friction rub.   No murmur heard. Pulmonary/Chest: Effort normal and breath sounds normal. No respiratory distress.  Abdominal: Soft. Bowel sounds are normal. He exhibits no distension. There is tenderness (minimal tenderness on palpation of the RUQ). There is no rebound and no guarding.  Musculoskeletal: Normal range of motion. He exhibits no tenderness.  Neurological: He is alert and oriented to  person, place, and time. No cranial nerve deficit.  Skin: Skin is warm and dry. No rash noted.    ED Course  Procedures (including critical care time)  Labs Reviewed  COMPREHENSIVE METABOLIC PANEL - Abnormal; Notable for the following:    Glucose, Bld 154 (*)    AST 232 (*)    ALT 193 (*)    All other components within normal limits  URINALYSIS, ROUTINE W REFLEX MICROSCOPIC - Abnormal; Notable for the following:    Color, Urine AMBER (*) BIOCHEMICALS MAY BE AFFECTED BY COLOR   APPearance TURBID (*)    Protein, ur 30 (*)    All other components within normal limits  CBC  LIPASE, BLOOD  URINE MICROSCOPIC-ADD ON   US Abdomen Complete  01/11/2012  *RADIOLOGY REPORT*  Clinical Data:  Right upper quadrant pain.  COMPLETE ABDOMINAL ULTRASOUND  Comparison:  None.  Findings:  Gallbladder:  There are multiple small echogenic foci with shadowing present consistent with multiple small gallstones.  The gallbladder wall thickness is normal measuring 2.7 mm in diameter. There is no pericholecystic fluid.  Common bile duct:  Normal caliber measuring 2 mm in diameter.  Liver:  No focal lesion identified.  Within normal limits in parenchymal echogenicity.  IVC:  Appears normal.  Pancreas:  The pancreas is not visualized in this patient due to overlying bowel gas.  Spleen:  The spleen measures 7.2 cm in length.  There are no focal abnormalities.  Right Kidney:  The right kidney measures 12.0 cm in length.  There is no hydronephrosis and there are no focal abnormalities.  Left Kidney:  The left kidney measures 12.1 cm in length.  There is no hydronephrosis and there are no focal abnormalities.  Abdominal aorta:  There is no aneurysm identified.  There is limited visualization of the abdominal aorta due to overlying gas.  IMPRESSION:  1.  Cholelithiasis with normal gallbladder wall thickness. 2.  Limited visualization of the pancreas and abdominal aorta due to overlying bowel gas.  Original Report Authenticated By:  Rolla Plate, M.D.   Ct Abdomen Pelvis W Contrast  01/11/2012  *RADIOLOGY REPORT*  Clinical Data: Severe right upper quadrant abdominal pain  CT ABDOMEN AND PELVIS WITH CONTRAST  Technique:  Multidetector CT imaging of the abdomen and pelvis was performed following the standard protocol during bolus administration of intravenous contrast.  Contrast: 100 ml Omnipaque-300 IV  Comparison: Ultrasound dated 01/11/2012  Findings: Mild dependent atelectasis at the lung bases.  Mild hepatic steatosis.  Spleen, pancreas, and adrenal glands are within normal limits.  Gallbladder is notable for mild pericholecystic inflammatory changes (series 2/image 37).  Known cholelithiasis is not evident on CT.  Kidneys are within normal limits.  No hydronephrosis.  No evidence of bowel obstruction.  Normal appendix.  No evidence of abdominal aortic aneurysm.  No abdominopelvic ascites.  No suspicious abdominopelvic lymphadenopathy.  Prostate is unremarkable.  Bladder is within normal limits.  Fat-containing right inguinal hernia.  Degenerative changes of the visualized thoracolumbar spine.  IMPRESSION: Mild pericholecystic inflammatory changes, worrisome for early acute cholecystitis.  Known cholelithiasis is not evident on CT. Consider hepatobiliary  nuclear medicine scan for further evaluation as clinically warranted.  Mild hepatic steatosis.  Original Report Authenticated By: Charline Bills, M.D.     1. RUQ pain   2. Transaminitis       MDM  Evaluated by general surgery. They will admit the patient for further evaluation and treatment. There is concern for cholecystitis on imaging.        Dayton Bailiff, MD 01/11/12 [phone]

## 2012-01-11 NOTE — ED Notes (Signed)
[facility] at bedside and preparing pt for transport 

## 2012-01-11 NOTE — ED Notes (Signed)
HQI:[id number]<XB> Expected date:<BR> Expected time:<BR> Means of arrival:<BR> Comments:<BR> Med center pt

## 2012-01-11 NOTE — ED Notes (Signed)
Patient transported to Ultrasound 

## 2012-01-11 NOTE — ED Notes (Signed)
Judeth Cornfield, PA-C at bedside speaking with pt and family related to admission.

## 2012-01-11 NOTE — ED Notes (Signed)
Pt transported by NT to [id number].

## 2012-01-11 NOTE — ED Notes (Signed)
Judeth Cornfield, PA-C at bedside speaking with pt and family.  Plan of care discussed.

## 2012-01-11 NOTE — ED Notes (Signed)
Res has returned from X Ray at 2005

## 2012-01-11 NOTE — ED Provider Notes (Signed)
History     CSN: [id number]  Arrival date & time 01/11/12  0813   First MD Initiated Contact with Patient 01/11/12 0930      Chief Complaint  Patient presents with  . Abdominal Pain  . Emesis     The history is provided by the patient.   the patient is an otherwise healthy 51 year old male who presents with a chief complaint of acute onset constant, unchanged, 10 out of 10, cramping epigastric and right upper quadrant abdominal pain that began at 1:30 AM this morning and has been associated with vomiting. He ate fried foods last night for dinner. He denies any prior similar symptoms. He denies any fever, chills, chest pain, shortness of breath, change in bowel habits, dysuria. He took an antacid to try to relieve the pain without any change.  History reviewed. No pertinent past medical history.  History reviewed. No pertinent past surgical history.  No family history on file.  History  Substance Use Topics  . Smoking status: Never Smoker   . Smokeless tobacco: Not on file  . Alcohol Use: Yes     occasional      Review of Systems` 10 systems reviewed and are negative for acute change except as noted in the HPI.  Allergies  Review of patient's allergies indicates no known allergies.  Home Medications  No current outpatient prescriptions on file.  BP 144/80  Pulse 60  Resp 18  Ht 5\' 11"  (1.803 m)  Wt 295 lb (133.811 kg)  BMI 41.14 kg/m2  SpO2 99%  Physical Exam  Nursing note and vitals reviewed. Constitutional: He is oriented to person, place, and time. He appears well-developed and well-nourished. He appears distressed.       obese  HENT:  Head: Normocephalic and atraumatic.  Right Ear: External ear normal.  Left Ear: External ear normal.  Mouth/Throat: Oropharynx is clear and moist.  Eyes: Pupils are equal, round, and reactive to light.  Neck: Normal range of motion. Neck supple.  Cardiovascular: Normal rate, regular rhythm, normal heart sounds and intact  distal pulses.   No murmur heard. Pulmonary/Chest: Effort normal and breath sounds normal. No respiratory distress. He has no wheezes. He exhibits no tenderness.  Abdominal: Soft. Bowel sounds are normal. He exhibits no distension. There is tenderness in the right upper quadrant and epigastric area. There is positive Murphy's sign. There is no rigidity, no rebound and no guarding.  Musculoskeletal: He exhibits no edema and no tenderness.  Neurological: He is alert and oriented to person, place, and time. No cranial nerve deficit.  Skin: Skin is warm and dry. No rash noted.  Psychiatric: He has a normal mood and affect.    ED Course  Procedures (including critical care time)  Labs Reviewed  COMPREHENSIVE METABOLIC PANEL - Abnormal; Notable for the following:    Glucose, Bld 154 (*)    AST 232 (*)    ALT 193 (*)    All other components within normal limits  CBC  LIPASE, BLOOD  URINALYSIS, ROUTINE W REFLEX MICROSCOPIC   US Abdomen Complete  01/11/2012  *RADIOLOGY REPORT*  Clinical Data:  Right upper quadrant pain.  COMPLETE ABDOMINAL ULTRASOUND  Comparison:  None.  Findings:  Gallbladder:  There are multiple small echogenic foci with shadowing present consistent with multiple small gallstones.  The gallbladder wall thickness is normal measuring 2.7 mm in diameter. There is no pericholecystic fluid.  Common bile duct:  Normal caliber measuring 2 mm in diameter.  Liver:  No focal lesion identified.  Within normal limits in parenchymal echogenicity.  IVC:  Appears normal.  Pancreas:  The pancreas is not visualized in this patient due to overlying bowel gas.  Spleen:  The spleen measures 7.2 cm in length.  There are no focal abnormalities.  Right Kidney:  The right kidney measures 12.0 cm in length.  There is no hydronephrosis and there are no focal abnormalities.  Left Kidney:  The left kidney measures 12.1 cm in length.  There is no hydronephrosis and there are no focal abnormalities.  Abdominal  aorta:  There is no aneurysm identified.  There is limited visualization of the abdominal aorta due to overlying gas.  IMPRESSION:  1.  Cholelithiasis with normal gallbladder wall thickness. 2.  Limited visualization of the pancreas and abdominal aorta due to overlying bowel gas.  Original Report Authenticated By: Rolla Plate, M.D.   Ct Abdomen Pelvis W Contrast  01/11/2012  *RADIOLOGY REPORT*  Clinical Data: Severe right upper quadrant abdominal pain  CT ABDOMEN AND PELVIS WITH CONTRAST  Technique:  Multidetector CT imaging of the abdomen and pelvis was performed following the standard protocol during bolus administration of intravenous contrast.  Contrast: 100 ml Omnipaque-300 IV  Comparison: Ultrasound dated 01/11/2012  Findings: Mild dependent atelectasis at the lung bases.  Mild hepatic steatosis.  Spleen, pancreas, and adrenal glands are within normal limits.  Gallbladder is notable for mild pericholecystic inflammatory changes (series 2/image 37).  Known cholelithiasis is not evident on CT.  Kidneys are within normal limits.  No hydronephrosis.  No evidence of bowel obstruction.  Normal appendix.  No evidence of abdominal aortic aneurysm.  No abdominopelvic ascites.  No suspicious abdominopelvic lymphadenopathy.  Prostate is unremarkable.  Bladder is within normal limits.  Fat-containing right inguinal hernia.  Degenerative changes of the visualized thoracolumbar spine.  IMPRESSION: Mild pericholecystic inflammatory changes, worrisome for early acute cholecystitis.  Known cholelithiasis is not evident on CT. Consider hepatobiliary nuclear medicine scan for further evaluation as clinically warranted.  Mild hepatic steatosis.  Original Report Authenticated By: Charline Bills, M.D.     Dx 1: Cholelithiasis Dx 2: Transaminitis   MDM  9:40 AM Pt has been seen and evaluated. Suspect biliary pathology given history, PE findings, and transaminitis. [place] ordered. Will continue to  monitor.   10:30 Pain not controlled with morphine. Dilaudid ordered. No continued vomiting.      12:30 Pt [place] as shown above- cholelithiasis. Has required multiple additional pain medication doses. Will call hospitalist for admission and further eval of cholelithiasis without cholecystitis with transaminitis.    1:10 PM Hospitalist requests CT abd for further evaluation prior to their acceptance of this patient. Ordered. Additional pain medication has been ordered.    2:30 PM CT scan with evidence of possible early cholecystitis and recommendation for NM scan for further eval. Surgery consult has been placed.   3:00 PM Attending MD has spoken with [facility] PA, will jennings, who agrees with recommendation for HIDA scan and a transfer to [place] for further evaluation and definitive treatment.          Shaaron Adler, PA-C 01/11/12 1626

## 2012-01-11 NOTE — ED Provider Notes (Signed)
Medical screening examination/treatment/procedure(s) were conducted as a shared visit with non-physician practitioner(s) and myself.  I personally evaluated the patient during the encounter  Min RUQ ttp. Transaminitis noted. U/S with cholelithiasis, CT with concerns for acute cholecystitis. Discussed with General surgery PA, Marlyne Beards. They will eval in the ED at [facility]. Recommend HIDA SCAN-- ordered. PA D/W Dr. Manus Gunning, EDP  Forbes Cellar, MD 01/11/12 [phone]

## 2012-01-11 NOTE — ED Notes (Signed)
Went into see patient and he was gone to [employer] per his family

## 2012-01-12 ENCOUNTER — Inpatient Hospital Stay (HOSPITAL_COMMUNITY): Payer: BC Managed Care – PPO

## 2012-01-12 ENCOUNTER — Encounter (HOSPITAL_COMMUNITY): Payer: Self-pay | Admitting: Anesthesiology

## 2012-01-12 ENCOUNTER — Inpatient Hospital Stay (HOSPITAL_COMMUNITY): Payer: BC Managed Care – PPO | Admitting: Anesthesiology

## 2012-01-12 ENCOUNTER — Encounter (HOSPITAL_COMMUNITY): Admission: EM | Disposition: A | Discharge status: Home / Self Care | Payer: Self-pay | Source: Home / Self Care

## 2012-01-12 ENCOUNTER — Other Ambulatory Visit: Payer: Self-pay

## 2012-01-12 DIAGNOSIS — K812 Acute cholecystitis with chronic cholecystitis: Secondary | ICD-10-CM

## 2012-01-12 HISTORY — PX: CHOLECYSTECTOMY: SHX55

## 2012-01-12 LAB — COMPREHENSIVE METABOLIC PANEL
AST: 131 U/L — ABNORMAL HIGH (ref 0–37)
Albumin: 3.3 g/dL — ABNORMAL LOW (ref 3.5–5.2)
Calcium: 8.7 mg/dL (ref 8.4–10.5)
Creatinine, Ser: 0.96 mg/dL (ref 0.50–1.35)
Total Protein: 6.6 g/dL (ref 6.0–8.3)

## 2012-01-12 SURGERY — LAPAROSCOPIC CHOLECYSTECTOMY WITH INTRAOPERATIVE CHOLANGIOGRAM
Anesthesia: General | Site: Abdomen | Wound class: Contaminated

## 2012-01-12 MED ORDER — LIDOCAINE HCL (CARDIAC) 20 MG/ML IV SOLN
INTRAVENOUS | Status: DC | PRN
  Administered 2012-01-12: 100 mg via INTRAVENOUS

## 2012-01-12 MED ORDER — LACTATED RINGERS IR SOLN
Status: DC | PRN
  Administered 2012-01-12: 1000 mL

## 2012-01-12 MED ORDER — EPHEDRINE SULFATE 50 MG/ML IJ SOLN
INTRAMUSCULAR | Status: DC | PRN
  Administered 2012-01-12: 5 mg via INTRAVENOUS

## 2012-01-12 MED ORDER — ONDANSETRON HCL 4 MG/2ML IJ SOLN
INTRAMUSCULAR | Status: DC | PRN
  Administered 2012-01-12: 4 mg via INTRAVENOUS

## 2012-01-12 MED ORDER — MORPHINE SULFATE 2 MG/ML IJ SOLN
2.0000 mg | INTRAMUSCULAR | Status: DC | PRN
  Administered 2012-01-12 – 2012-01-13 (×7): 4 mg via INTRAVENOUS
  Filled 2012-01-12 (×7): qty 2

## 2012-01-12 MED ORDER — OXYCODONE-ACETAMINOPHEN 5-325 MG PO TABS
1.0000 | ORAL_TABLET | ORAL | Status: DC | PRN
  Administered 2012-01-13 – 2012-01-14 (×5): 2 via ORAL
  Filled 2012-01-12 (×5): qty 2

## 2012-01-12 MED ORDER — BUPIVACAINE-EPINEPHRINE 0.25% -1:200000 IJ SOLN
INTRAMUSCULAR | Status: DC | PRN
  Administered 2012-01-12: 15 mL

## 2012-01-12 MED ORDER — MIDAZOLAM HCL 5 MG/5ML IJ SOLN
INTRAMUSCULAR | Status: DC | PRN
  Administered 2012-01-12: 2 mg via INTRAVENOUS

## 2012-01-12 MED ORDER — LACTATED RINGERS IV SOLN
INTRAVENOUS | Status: DC
  Administered 2012-01-12 (×2): 1000 mL via INTRAVENOUS

## 2012-01-12 MED ORDER — ROCURONIUM BROMIDE 100 MG/10ML IV SOLN
INTRAVENOUS | Status: DC | PRN
  Administered 2012-01-12: 10 mg via INTRAVENOUS
  Administered 2012-01-12: 20 mg via INTRAVENOUS
  Administered 2012-01-12: 5 mg via INTRAVENOUS
  Administered 2012-01-12 (×2): 10 mg via INTRAVENOUS
  Administered 2012-01-12: 40 mg via INTRAVENOUS
  Administered 2012-01-12: 5 mg via INTRAVENOUS

## 2012-01-12 MED ORDER — FENTANYL CITRATE 0.05 MG/ML IJ SOLN
INTRAMUSCULAR | Status: DC | PRN
  Administered 2012-01-12 (×7): 50 ug via INTRAVENOUS

## 2012-01-12 MED ORDER — HYDROMORPHONE HCL PF 1 MG/ML IJ SOLN
0.2500 mg | INTRAMUSCULAR | Status: DC | PRN
  Administered 2012-01-12 (×5): 0.25 mg via INTRAVENOUS
  Administered 2012-01-12: 0.5 mg via INTRAVENOUS

## 2012-01-12 MED ORDER — ENOXAPARIN SODIUM 40 MG/0.4ML ~~LOC~~ SOLN
40.0000 mg | SUBCUTANEOUS | Status: DC
  Administered 2012-01-13: 40 mg via SUBCUTANEOUS
  Filled 2012-01-12 (×3): qty 0.4

## 2012-01-12 MED ORDER — PROPOFOL 10 MG/ML IV EMUL
INTRAVENOUS | Status: DC | PRN
  Administered 2012-01-12: 200 mg via INTRAVENOUS

## 2012-01-12 MED ORDER — SODIUM CHLORIDE 0.9 % IJ SOLN
INTRAMUSCULAR | Status: DC | PRN
  Administered 2012-01-12: 15:00:00

## 2012-01-12 MED ORDER — GLYCOPYRROLATE 0.2 MG/ML IJ SOLN
INTRAMUSCULAR | Status: DC | PRN
  Administered 2012-01-12: .7 mg via INTRAVENOUS

## 2012-01-12 MED ORDER — KCL IN DEXTROSE-NACL 20-5-0.45 MEQ/L-%-% IV SOLN
INTRAVENOUS | Status: DC
  Administered 2012-01-13: 02:00:00 via INTRAVENOUS
  Filled 2012-01-12 (×5): qty 1000

## 2012-01-12 MED ORDER — PROMETHAZINE HCL 25 MG/ML IJ SOLN: 6.2500 mg | INTRAMUSCULAR | Status: DC | PRN

## 2012-01-12 MED ORDER — DEXAMETHASONE SODIUM PHOSPHATE 10 MG/ML IJ SOLN
INTRAMUSCULAR | Status: DC | PRN
  Administered 2012-01-12: 10 mg via INTRAVENOUS

## 2012-01-12 MED ORDER — NEOSTIGMINE METHYLSULFATE 1 MG/ML IJ SOLN
INTRAMUSCULAR | Status: DC | PRN
  Administered 2012-01-12: 5 mg via INTRAVENOUS

## 2012-01-12 MED ORDER — ACETAMINOPHEN 10 MG/ML IV SOLN
INTRAVENOUS | Status: DC | PRN
  Administered 2012-01-12: 1000 mg via INTRAVENOUS

## 2012-01-12 SURGICAL SUPPLY — 44 items
APL SKNCLS STERI-STRIP NONHPOA (GAUZE/BANDAGES/DRESSINGS) ×1
APPLIER CLIP ROT 10 11.4 M/L (STAPLE) ×2
APR CLP MED LRG 11.4X10 (STAPLE) ×1
BAG SPEC RTRVL LRG 6X4 10 (ENDOMECHANICALS) ×1
BENZOIN TINCTURE PRP APPL 2/3 (GAUZE/BANDAGES/DRESSINGS) ×2 IMPLANT
CANISTER SUCTION 2500CC (MISCELLANEOUS) ×2 IMPLANT
CHLORAPREP W/TINT 26ML (MISCELLANEOUS) ×2 IMPLANT
CLIP APPLIE ROT 10 11.4 M/L (STAPLE) ×1 IMPLANT
CLOTH BEACON ORANGE TIMEOUT ST (SAFETY) ×2 IMPLANT
COVER MAYO STAND STRL (DRAPES) ×2 IMPLANT
DECANTER SPIKE VIAL GLASS SM (MISCELLANEOUS) ×2 IMPLANT
DRAIN CHANNEL 19F RND (DRAIN) ×1 IMPLANT
DRAPE C-ARM 42X72 X-RAY (DRAPES) ×2 IMPLANT
DRAPE LAPAROSCOPIC ABDOMINAL (DRAPES) ×2 IMPLANT
DRAPE UTILITY XL STRL (DRAPES) ×2 IMPLANT
DRSG TEGADERM 2-3/8X2-3/4 SM (GAUZE/BANDAGES/DRESSINGS) ×7 IMPLANT
DRSG TEGADERM 4X4.75 (GAUZE/BANDAGES/DRESSINGS) ×2 IMPLANT
ELECT REM PT RETURN 9FT ADLT (ELECTROSURGICAL) ×2
ELECTRODE REM PT RTRN 9FT ADLT (ELECTROSURGICAL) ×1 IMPLANT
EVACUATOR 1/8 PVC DRAIN (DRAIN) ×1 IMPLANT
FILTER SMOKE EVAC LAPAROSHD (FILTER) ×2 IMPLANT
GLOVE BIO SURGEON STRL SZ7 (GLOVE) ×2 IMPLANT
GLOVE BIOGEL PI IND STRL 7.0 (GLOVE) ×1 IMPLANT
GLOVE BIOGEL PI IND STRL 7.5 (GLOVE) ×1 IMPLANT
GLOVE BIOGEL PI INDICATOR 7.0 (GLOVE) ×1
GLOVE BIOGEL PI INDICATOR 7.5 (GLOVE) ×1
GOWN STRL NON-REIN LRG LVL3 (GOWN DISPOSABLE) ×4 IMPLANT
GOWN STRL REIN XL XLG (GOWN DISPOSABLE) ×2 IMPLANT
HEMOSTAT SNOW SURGICEL 2X4 (HEMOSTASIS) ×1 IMPLANT
KIT BASIN OR (CUSTOM PROCEDURE TRAY) ×2 IMPLANT
NS IRRIG 1000ML POUR BTL (IV SOLUTION) ×2 IMPLANT
POUCH SPECIMEN RETRIEVAL 10MM (ENDOMECHANICALS) ×2 IMPLANT
SET CHOLANGIOGRAPH MIX (MISCELLANEOUS) ×2 IMPLANT
SET IRRIG TUBING LAPAROSCOPIC (IRRIGATION / IRRIGATOR) ×2 IMPLANT
SOLUTION ANTI FOG 6CC (MISCELLANEOUS) ×2 IMPLANT
STRIP CLOSURE SKIN 1/2X4 (GAUZE/BANDAGES/DRESSINGS) ×2 IMPLANT
SUT ETHILON 3 0 PS 1 (SUTURE) ×1 IMPLANT
SUT MNCRL AB 4-0 PS2 18 (SUTURE) ×2 IMPLANT
TOWEL OR 17X26 10 PK STRL BLUE (TOWEL DISPOSABLE) ×2 IMPLANT
TRAY LAP CHOLE (CUSTOM PROCEDURE TRAY) ×2 IMPLANT
TROCAR BLADELESS OPT 5 75 (ENDOMECHANICALS) ×5 IMPLANT
TROCAR XCEL BLUNT TIP 100MML (ENDOMECHANICALS) ×2 IMPLANT
TROCAR XCEL NON-BLD 11X100MML (ENDOMECHANICALS) ×2 IMPLANT
TUBING INSUFFLATION 10FT LAP (TUBING) ×2 IMPLANT

## 2012-01-12 NOTE — Transfer of Care (Signed)
Immediate Anesthesia Transfer of Care Note  Patient: Bryan [name]  Procedure(s) Performed:  LAPAROSCOPIC CHOLECYSTECTOMY WITH INTRAOPERATIVE CHOLANGIOGRAM  Patient Location: PACU  Anesthesia Type: General  Level of Consciousness: sedated, patient cooperative and responds to stimulaton  Airway & Oxygen Therapy: Patient Spontanous Breathing and Patient connected to face mask oxgen  Post-op Assessment: Report given to PACU RN and Post -op Vital signs reviewed and stable  Post vital signs: Reviewed and stable  Complications: No apparent anesthesia complications

## 2012-01-12 NOTE — Anesthesia Postprocedure Evaluation (Signed)
  Anesthesia Post-op Note  Patient: Bryan [name]  Procedure(s) Performed:  LAPAROSCOPIC CHOLECYSTECTOMY WITH INTRAOPERATIVE CHOLANGIOGRAM  Patient Location: PACU  Anesthesia Type: General  Level of Consciousness: oriented and sedated  Airway and Oxygen Therapy: Patient Spontanous Breathing and Patient connected to nasal cannula oxygen  Post-op Pain: mild  Post-op Assessment: Post-op Vital signs reviewed, Patient's Cardiovascular Status Stable, Respiratory Function Stable, Patent Airway and No signs of Nausea or vomiting  Post-op Vital Signs: stable  Complications: No apparent anesthesia complications

## 2012-01-12 NOTE — Anesthesia Preprocedure Evaluation (Signed)
Anesthesia Evaluation  Patient identified by MRN, date of birth, ID band Patient awake    Reviewed: Allergy & Precautions, H&P , NPO status , Patient's Chart, lab work & pertinent test results, reviewed documented beta blocker date and time   Airway Mallampati: II TM Distance: >3 FB Neck ROM: Full    Dental  (+) Teeth Intact   Pulmonary neg pulmonary ROS, sleep apnea and Continuous Positive Airway Pressure Ventilation ,  clear to auscultation        Cardiovascular neg cardio ROS Regular Normal Denies cardiac symptoms   Neuro/Psych Negative Neurological ROS  Negative Psych ROS   GI/Hepatic Neg liver ROS, cholecystitis   Endo/Other  Morbid obesity  Renal/GU negative Renal ROS  Genitourinary negative   Musculoskeletal negative musculoskeletal ROS (+)   Abdominal   Peds negative pediatric ROS (+)  Hematology negative hematology ROS (+)   Anesthesia Other Findings   Reproductive/Obstetrics negative OB ROS                           Anesthesia Physical Anesthesia Plan  ASA: III  Anesthesia Plan: General   Post-op Pain Management:    Induction: Intravenous  Airway Management Planned: Oral ETT  Additional Equipment:   Intra-op Plan:   Post-operative Plan: Extubation in OR  Informed Consent: I have reviewed the patients History and Physical, chart, labs and discussed the procedure including the risks, benefits and alternatives for the proposed anesthesia with the patient or authorized representative who has indicated his/her understanding and acceptance.   Dental advisory given  Plan Discussed with: CRNA and Surgeon  Anesthesia Plan Comments:         Anesthesia Quick Evaluation

## 2012-01-12 NOTE — Progress Notes (Signed)
Patient ID: Bryan [name], male   DOB: February 23, 1961, 51 y.o.   MRN: [id number]   Filed Vitals:   01/12/12 0630  BP: 114/77  Pulse: 78  Temp: 99 F (37.2 C)  Resp: 18    Still with some pain - requiring IV pain medication  Upper abdominal tenderness  Tbili 0.6   Plan laparoscopic cholecystectomy later today.   The surgical procedure has been discussed with the patient.  Potential risks, benefits, alternative treatments, and expected outcomes have been explained.  All of the patient's questions at this time have been answered.  The likelihood of reaching the patient's treatment goal is good.  The patient understand the proposed surgical procedure and wishes to proceed.   Bryan Arms. Corliss Skains, MD, [facility]  01/12/2012 9:15 AM

## 2012-01-12 NOTE — Op Note (Signed)
Laparoscopic Cholecystectomy with IOC Procedure Note  Indications: This patient presents with symptomatic gallbladder disease and will undergo laparoscopic cholecystectomy.  Pre-operative Diagnosis: Calculus of gallbladder with acute cholecystitis, without mention of obstruction  Post-operative Diagnosis: Same  Surgeon: Prisma Decarlo K.   Assistants: Will Marlyne Beards, PA-C  Anesthesia: General endotracheal anesthesia  ASA Class: 3  Procedure Details  The patient was seen again in the Holding Room. The risks, benefits, complications, treatment options, and expected outcomes were discussed with the patient. The possibilities of reaction to medication, pulmonary aspiration, perforation of viscus, bleeding, recurrent infection, finding a normal gallbladder, the need for additional procedures, failure to diagnose a condition, the possible need to convert to an open procedure, and creating a complication requiring transfusion or operation were discussed with the patient. The likelihood of improving the patient's symptoms with return to their baseline status is good.  The patient and/or family concurred with the proposed plan, giving informed consent. The site of surgery properly noted. The patient was taken to Operating Room, identified as Bryan [name] and the procedure verified as Laparoscopic Cholecystectomy with Intraoperative Cholangiogram. A Time Out was held and the above information confirmed.  Prior to the induction of general anesthesia, antibiotic prophylaxis was administered. General endotracheal anesthesia was then administered and tolerated well. After the induction, the abdomen was prepped with Chloraprep and draped in the sterile fashion. The patient was positioned in the supine position.  Local anesthetic agent was injected into the skin near the umbilicus and an incision made. We dissected down to the abdominal fascia with blunt dissection.  The fascia was incised vertically and we  entered the peritoneal cavity bluntly.  A pursestring suture of 0-Vicryl was placed around the fascial opening.  The Hasson cannula was inserted and secured with the stay suture.  Pneumoperitoneum was then created with CO2 and tolerated well without any adverse changes in the patient's vital signs. An 11-mm port was placed in the subxiphoid position.  Two 5-mm ports were placed in the right upper quadrant. All skin incisions were infiltrated with a local anesthetic agent before making the incision and placing the trocars.   We positioned the patient in reverse Trendelenburg, tilted slightly to the patient's left.  Initially, we could not identify the gallbladder due to the enormous amount of omental adhesions in this area.  We took these adhesions down with blunt dissection and cautery.  The gallbladder was identified and was very erythematous, edematous, and thickened. Adhesions were lysed bluntly and with the electrocautery where indicated, taking care not to injure any adjacent organs or viscus. Due to the patient's body habitus, we placed an additional 5 mm port and used a flexible retractor to retractor the transverse colon inferiorly.  We were finally able to bluntly dissect down to the infundibulum and identify the cystic duct. The cystic duct was clearly identified and bluntly dissected circumferentially. The cystic duct was ligated with a clip distally.   An incision was made in the cystic duct and the [facility] cholangiogram catheter introduced. The catheter was secured using a clip. A cholangiogram was then obtained which showed good visualization of the distal and proximal biliary tree with no sign of filling defects or obstruction.  Contrast flowed easily into the duodenum. The catheter was then removed.   The cystic duct was then ligated with clips and divided. The cystic artery was identified, dissected free, ligated with clips and divided as well.   The gallbladder was dissected from the liver bed  in retrograde fashion  with the electrocautery. This was quite difficult due to the inflamed gallbladder.  The gallbladder was removed and placed in an Endocatch sac. The liver bed was irrigated and inspected. Hemostasis was achieved with the electrocautery. Copious irrigation was utilized and was repeatedly aspirated until clear.  Surgicel SNOW was placed in the gallbladder fossa.  A 19 Fr blake drain was placed into the gallbladder fossa, exiting through the right lateral port. The gallbladder and Endocatch sac were then removed through the umbilical port site.  The pursestring suture was used to close the umbilical fascia.    We again inspected the right upper quadrant for hemostasis.  Pneumoperitoneum was released as we removed the trocars.  4-0 Monocryl was used to close the skin.   Benzoin, steri-strips, and clean dressings were applied. The patient was then extubated and brought to the recovery room in stable condition. Instrument, sponge, and needle counts were correct at closure and at the conclusion of the case.   Findings: Cholecystitis with Cholelithiasis  Estimated Blood Loss: less than 100 mL         Drains: Blake drain in GB fossa         Specimens: Gallbladder           Complications: None; patient tolerated the procedure well.         Disposition: PACU - hemodynamically stable.         Condition: stable

## 2012-01-12 NOTE — Progress Notes (Signed)
Pt has been educated regarding correct use of Incentive Spirometer (IS). Pt verbalizes understanding of correct use. Pt states that there is no further questions regarding correct use of the IS. Gared Gillie Lorraine Ashleigh Luckow, RN    

## 2012-01-13 ENCOUNTER — Other Ambulatory Visit (INDEPENDENT_AMBULATORY_CARE_PROVIDER_SITE_OTHER): Payer: Self-pay | Admitting: General Surgery

## 2012-01-13 LAB — COMPREHENSIVE METABOLIC PANEL
ALT: 204 U/L — ABNORMAL HIGH (ref 0–53)
Alkaline Phosphatase: 51 U/L (ref 39–117)
CO2: 29 mEq/L (ref 19–32)
Chloride: 99 mEq/L (ref 96–112)
GFR calc Af Amer: >90 mL/min (ref >90)
GFR calc non Af Amer: 82 mL/min — ABNORMAL LOW (ref >90)
Glucose, Bld: 117 mg/dL — ABNORMAL HIGH (ref 70–99)
Potassium: 4 mEq/L (ref 3.5–5.1)
Sodium: 134 mEq/L — ABNORMAL LOW (ref 135–145)

## 2012-01-13 LAB — CBC
MCHC: 33.1 g/dL (ref 30.0–36.0)
RDW: 14 % (ref 11.5–15.5)

## 2012-01-13 NOTE — Progress Notes (Signed)
1 Day Post-Op  Subjective: Pt ok. Sore as expected but no nausea. Voiding ok, feels like incomplete emptying but better each time. Pain control adequate  Objective: Vital signs in last 24 hours: Temp:  [97.3 F (36.3 C)-99.4 F (37.4 C)] 98.6 F (37 C) (02/06 0545) Pulse Rate:  [71-88] 73  (02/06 0545) Resp:  [18-23] 18  (02/06 0545) BP: (100-165)/(65-80) 102/65 mmHg (02/06 0545) SpO2:  [92 %-98 %] 97 % (02/06 0545) Last BM Date: 01/11/12  Intake/Output this shift: Total I/O In: 240 [P.O.:240] Out: 20 [Drains:20]  Physical Exam: BP 102/65  Pulse 73  Temp(Src) 98.6 F (37 C) (Oral)  Resp 18  Ht 5\' 11"  (1.803 m)  Wt 133.811 kg (295 lb)  BMI 41.14 kg/m2  SpO2 97% Lungs: CTA without w/r/r Heart: Regular Abdomen: soft, ND, appropriately tender   Incisions all c/d/i without erythema or hematoma.   Drain intact, serous output, no bile Ext: No edema or tenderness   Labs: CBC  Basename 01/13/12 0328 01/11/12 0848  WBC 9.9 8.0  HGB 12.0* 14.4  HCT 36.2* 41.0  PLT 193 254   BMET  Basename 01/13/12 0328 01/12/12 0403  NA 134* 137  K 4.0 3.9  CL 99 100  CO2 29 30  GLUCOSE 117* 109*  BUN 10 11  CREATININE 1.05 0.96  CALCIUM 8.5 8.7   LFT  Basename 01/13/12 0328 01/11/12 0851  PROT 6.2 --  ALBUMIN 2.9* --  AST 81* --  ALT 204* --  ALKPHOS 51 --  BILITOT 0.5 --  BILIDIR -- --  IBILI -- --  LIPASE -- 20   PT/INR No results found for this basename: LABPROT:2,INR:2 in the last 72 hours ABG No results found for this basename: PHART:2,PCO2:2,PO2:2,HCO3:2 in the last 72 hours  Studies/Results: Dg Cholangiogram Operative  01/12/2012  *RADIOLOGY REPORT*  Clinical Data:   Cholecystitis.  INTRAOPERATIVE CHOLANGIOGRAM  Technique:  Cholangiographic images from the C-arm fluoroscopic device were submitted for interpretation post-operatively.  Please see the procedural report for the amount of contrast and the fluoroscopy time utilized.  Comparison:  CT abdomen  pelvis 01/11/2012.  Ultrasound performed at the same day.  Findings:  The gallbladder has been removed and the cystic duct cannulated.  There is good filling of the cystic duct, common hepatic duct, and common bile duct.  There is good excretion into the duodenum.  No biliary calculi are seen.  The common bile duct is of normal caliber.  IMPRESSION: No retained biliary stones are seen. Negative exam.  Original Report Authenticated By: Elsie Stain, M.D.   US Abdomen Complete  01/11/2012  *RADIOLOGY REPORT*  Clinical Data:  Right upper quadrant pain.  COMPLETE ABDOMINAL ULTRASOUND  Comparison:  None.  Findings:  Gallbladder:  There are multiple small echogenic foci with shadowing present consistent with multiple small gallstones.  The gallbladder wall thickness is normal measuring 2.7 mm in diameter. There is no pericholecystic fluid.  Common bile duct:  Normal caliber measuring 2 mm in diameter.  Liver:  No focal lesion identified.  Within normal limits in parenchymal echogenicity.  IVC:  Appears normal.  Pancreas:  The pancreas is not visualized in this patient due to overlying bowel gas.  Spleen:  The spleen measures 7.2 cm in length.  There are no focal abnormalities.  Right Kidney:  The right kidney measures 12.0 cm in length.  There is no hydronephrosis and there are no focal abnormalities.  Left Kidney:  The left kidney measures 12.1 cm in  length.  There is no hydronephrosis and there are no focal abnormalities.  Abdominal aorta:  There is no aneurysm identified.  There is limited visualization of the abdominal aorta due to overlying gas.  IMPRESSION:  1.  Cholelithiasis with normal gallbladder wall thickness. 2.  Limited visualization of the pancreas and abdominal aorta due to overlying bowel gas.  Original Report Authenticated By: Rolla Plate, M.D.   Ct Abdomen Pelvis W Contrast  01/11/2012  *RADIOLOGY REPORT*  Clinical Data: Severe right upper quadrant abdominal pain  CT ABDOMEN AND PELVIS WITH  CONTRAST  Technique:  Multidetector CT imaging of the abdomen and pelvis was performed following the standard protocol during bolus administration of intravenous contrast.  Contrast: 100 ml Omnipaque-300 IV  Comparison: Ultrasound dated 01/11/2012  Findings: Mild dependent atelectasis at the lung bases.  Mild hepatic steatosis.  Spleen, pancreas, and adrenal glands are within normal limits.  Gallbladder is notable for mild pericholecystic inflammatory changes (series 2/image 37).  Known cholelithiasis is not evident on CT.  Kidneys are within normal limits.  No hydronephrosis.  No evidence of bowel obstruction.  Normal appendix.  No evidence of abdominal aortic aneurysm.  No abdominopelvic ascites.  No suspicious abdominopelvic lymphadenopathy.  Prostate is unremarkable.  Bladder is within normal limits.  Fat-containing right inguinal hernia.  Degenerative changes of the visualized thoracolumbar spine.  IMPRESSION: Mild pericholecystic inflammatory changes, worrisome for early acute cholecystitis.  Known cholelithiasis is not evident on CT. Consider hepatobiliary nuclear medicine scan for further evaluation as clinically warranted.  Mild hepatic steatosis.  Original Report Authenticated By: Charline Bills, M.D.    Assessment: Principal Problem:  *Cholelithiasis with acute cholecystitis   Procedure(s): LAPAROSCOPIC CHOLECYSTECTOMY WITH INTRAOPERATIVE CHOLANGIOGRAM  Plan: Continue Invanz another 24hr due to infected gallbladder and risk for post-op infection Saline lock IV  LOS: 2 days    Alyse Low 01/13/2012 9:45 AM

## 2012-01-13 NOTE — Progress Notes (Signed)
Pt seen, offered assistance with cpap.  Pt stated he has his machine from home and will place it on himself when ready.

## 2012-01-13 NOTE — Progress Notes (Signed)
Sore, but overall feeling OK Serosanguinous drainage from JP - no sign of bile. LFT's ok  Will advance diet.  Possible discharge tomorrow.  Wilmon Arms. Corliss Skains, MD, [facility]  01/13/2012 3:14 PM

## 2012-01-14 MED ORDER — OXYCODONE-ACETAMINOPHEN 5-325 MG PO TABS: 1.0000 | ORAL_TABLET | ORAL | Status: AC | PRN

## 2012-01-14 MED ORDER — CIPROFLOXACIN HCL 500 MG PO TABS: 500.0000 mg | ORAL_TABLET | Freq: Two times a day (BID) | ORAL | Status: AC

## 2012-01-14 NOTE — Discharge Summary (Signed)
Feels well.  Tolerated lunch. Ready for discharge.  Wilmon Arms. Corliss Skains, MD, [facility]  01/14/2012 1:49 PM

## 2012-01-14 NOTE — Progress Notes (Signed)
Pt given d/c instructions.  D/c instructions reviewed with pt and pt verbalized understanding.  JP drain care instructions verbalized to pt and pt did return demonstration successfully.  No comments or concerns at time of d/c.

## 2012-01-14 NOTE — Discharge Summary (Signed)
Physician Discharge Summary  Patient ID: Bryan [name] MRN: [id number] DOB/AGE: Mar 07, 1961 51 y.o.  Admit date: 01/11/2012 Discharge date: 01/14/2012  Admission Diagnoses: Acute cholecystitis Discharge Diagnoses:  Principal Problem:  *Cholelithiasis with acute cholecystitis    Procedures: Procedure(s): LAPAROSCOPIC CHOLECYSTECTOMY WITH INTRAOPERATIVE CHOLANGIOGRAM  Discharged Condition: good  Hospital Course: HPI: patient is a 51 year old male in his usual state of health until late last night when he was awakened with sudden onset of pain across his upper abdomen. He describes pressure-like or cramping pain across his epigastrium and in both upper quadrants. he took some over-the-counter pain medication and it improved but then he awoke again at 4 this morning with recurrent severe pain and vomited several times. He presented to the [place] emergency room. He has received pain medication which has improved her pain but not completely relieved. He is no longer nauseated but does not feel hungry. He denies any history of any similar episodes or chronic abdominal pain or GI complaints. no fever chills. No jaundice noted and no urinary symptoms.  He was admitted by Dr. Johna Sheriff after evaluation. He was then taken to the OR on 2/5 by Dr. Corliss Skains and underwent lap chole. See Op note for details. He tolerated procedure well. He stayed on abx for another 48hrs due to the severity of his infection. A drain was placed but no evidence of bile leak is noted.  His diet was advanced and he was feeling well. He has been OOB, ambulating well and voiding without difficulty. We feel he is stable for DC at this time. He will go home with his drain and has been given instructions.  Consults: None   Discharge Exam: Blood pressure 137/73, pulse 73, temperature 98.1 F (36.7 C), temperature source Oral, resp. rate 18, height 5\' 11"  (1.803 m), weight 133.811 kg (295 lb), SpO2 96.00%. Lungs: CTA without  w/r/r Heart: Regular Abdomen: soft, ND, appropriately tender   Incisions all c/d/i without erythema or hematoma.   Drain intact, SS output, no bile. Ext: No edema or tenderness   Disposition: DC to Home  Discharge Orders    Future Orders Please Complete By Expires   Diet - low sodium heart healthy      Increase activity slowly      May shower / Bathe      Comments:   Try to keep drain area as dry as possible.   Call MD for:  redness, tenderness, or signs of infection (pain, swelling, redness, odor or green/yellow discharge around incision site)      Call MD for:  severe uncontrolled pain      Call MD for:  persistant nausea and vomiting      Call MD for:  temperature >100.4        Medication List  As of 01/14/2012 10:19 AM   TAKE these medications         aspirin 81 MG tablet   Take 81 mg by mouth daily.      CENTRUM PO   Take 1 tablet by mouth daily.      ciprofloxacin 500 MG tablet   Commonly known as: CIPRO   Take 1 tablet (500 mg total) by mouth 2 (two) times daily.      fish oil-omega-3 fatty acids 1000 MG capsule   Take 1 g by mouth daily.      GAS-X 80 MG chewable tablet   Generic drug: simethicone   Chew 160 mg by mouth every 6 (six) hours as  needed. For stomach pain      Green Coffee Bean 400 MG Caps   Take 1 capsule by mouth daily.      oxyCODONE-acetaminophen 5-325 MG per tablet   Commonly known as: PERCOCET   Take 1-2 tablets by mouth every 4 (four) hours as needed.           Follow-up Information    Follow up with CCS,MD, MD on 01/19/2012. (DOW clinic 2:50pm)    Contact information:   [facility] [address] [place] [address] [phone]          Signed: Alyse Low 01/14/2012, 10:19 AM

## 2012-01-14 NOTE — Progress Notes (Signed)
2 Days Post-Op  Subjective: Feeling better. No flatus yet, but tole diet well with no N/V Pain control better with Percocet. Voiding well  Objective: Vital signs in last 24 hours: Temp:  [98.1 F (36.7 C)-99.2 F (37.3 C)] 98.1 F (36.7 C) (02/07 0600) Pulse Rate:  [67-90] 73  (02/07 0600) Resp:  [18-20] 18  (02/07 0600) BP: (122-155)/(70-83) 137/73 mmHg (02/07 0600) SpO2:  [92 %-96 %] 96 % (02/07 0600) Last BM Date: 01/11/12  Intake/Output this shift:    Physical Exam: BP 137/73  Pulse 73  Temp(Src) 98.1 F (36.7 C) (Oral)  Resp 18  Ht 5\' 11"  (1.803 m)  Wt 133.811 kg (295 lb)  BMI 41.14 kg/m2  SpO2 96% Lungs: CTA without w/r/r Heart: Regular Abdomen: soft, ND, appropriately tender   Incisions all c/d/i without erythema or hematoma. Drain intact, SS only, no bile Ext: No edema or tenderness   Labs: CBC  Basename 01/13/12 0328  WBC 9.9  HGB 12.0*  HCT 36.2*  PLT 193   BMET  Basename 01/13/12 0328 01/12/12 0403  NA 134* 137  K 4.0 3.9  CL 99 100  CO2 29 30  GLUCOSE 117* 109*  BUN 10 11  CREATININE 1.05 0.96  CALCIUM 8.5 8.7   LFT  Basename 01/13/12 0328  PROT 6.2  ALBUMIN 2.9*  AST 81*  ALT 204*  ALKPHOS 51  BILITOT 0.5  BILIDIR --  IBILI --  LIPASE --   PT/INR No results found for this basename: LABPROT:2,INR:2 in the last 72 hours ABG No results found for this basename: PHART:2,PCO2:2,PO2:2,HCO3:2 in the last 72 hours  Studies/Results: Dg Cholangiogram Operative  01/12/2012  *RADIOLOGY REPORT*  Clinical Data:   Cholecystitis.  INTRAOPERATIVE CHOLANGIOGRAM  Technique:  Cholangiographic images from the C-arm fluoroscopic device were submitted for interpretation post-operatively.  Please see the procedural report for the amount of contrast and the fluoroscopy time utilized.  Comparison:  CT abdomen pelvis 01/11/2012.  Ultrasound performed at the same day.  Findings:  The gallbladder has been removed and the cystic duct cannulated.  There is  good filling of the cystic duct, common hepatic duct, and common bile duct.  There is good excretion into the duodenum.  No biliary calculi are seen.  The common bile duct is of normal caliber.  IMPRESSION: No retained biliary stones are seen. Negative exam.  Original Report Authenticated By: Elsie Stain, M.D.    Assessment: Principal Problem:  *Cholelithiasis with acute cholecystitis   Procedure(s): LAPAROSCOPIC CHOLECYSTECTOMY WITH INTRAOPERATIVE CHOLANGIOGRAM  Plan: Advance diet. Walking Hopefully DC home later today  LOS: 3 days    Bryan [name] 01/14/2012 9:39 AM

## 2012-01-14 NOTE — Progress Notes (Signed)
Possible discharge later today.  Bryan [name]. Corliss Skains, MD, [facility]  01/14/2012 1:42 PM

## 2012-01-14 NOTE — Plan of Care (Signed)
Problem: Discharge Progression Outcomes Goal: Tubes and drains discontinued if indicated Outcome: Not Met (add Reason) Pt is to go home with JP drain

## 2012-01-19 ENCOUNTER — Ambulatory Visit (INDEPENDENT_AMBULATORY_CARE_PROVIDER_SITE_OTHER): Payer: BC Managed Care – PPO | Admitting: General Surgery

## 2012-01-19 ENCOUNTER — Encounter (INDEPENDENT_AMBULATORY_CARE_PROVIDER_SITE_OTHER): Payer: Self-pay | Admitting: General Surgery

## 2012-01-19 VITALS — BP 146/72 | HR 70 | Temp 98.0°F | Resp 18 | Ht 71.0 in | Wt 295.6 lb

## 2012-01-19 DIAGNOSIS — Z9889 Other specified postprocedural states: Secondary | ICD-10-CM

## 2012-01-19 DIAGNOSIS — K8000 Calculus of gallbladder with acute cholecystitis without obstruction: Secondary | ICD-10-CM

## 2012-01-19 NOTE — Progress Notes (Signed)
Bryan [name] 12/30/1960  HPI: This is a 51 yo male who had a lap chole last week.  He had a JP drain placed.  He returns today for drain evaluation and removal.  He is doing well with less than 10cc of output a day.  PE: Abd: soft, Nt, ND, +BS, JP with minimal serosang output.  JP drain removed without difficulty.  A/P: 1.s/p lap chole 2. Drain removal  Patient got dizzy with drain removal.  We laid him down and put a wet washcloth over his forehead.  His BP was rechecked and it was normal.  After several minutes of laying down and sitting up the patient was feeling better and was able to go home.  He will return in 2 weeks for a recheck.

## 2012-01-19 NOTE — Patient Instructions (Signed)
Follow up in 2 weeks for a postoperative appointment. Please call if you experience worsening pain or fevers >101.5

## 2012-02-02 ENCOUNTER — Ambulatory Visit (INDEPENDENT_AMBULATORY_CARE_PROVIDER_SITE_OTHER): Payer: BC Managed Care – PPO | Admitting: General Surgery

## 2012-02-02 ENCOUNTER — Encounter (INDEPENDENT_AMBULATORY_CARE_PROVIDER_SITE_OTHER): Payer: Self-pay

## 2012-02-02 VITALS — BP 132/86 | HR 76 | Temp 98.5°F | Resp 16 | Ht 71.0 in | Wt 299.4 lb

## 2012-02-02 DIAGNOSIS — K812 Acute cholecystitis with chronic cholecystitis: Secondary | ICD-10-CM

## 2012-02-02 NOTE — Progress Notes (Signed)
Bryan [name] 05-Jun-1961 [id number] 02/02/2012   Bryan [name] is a 51 y.o. male who had a laparoscopic cholecystectomy with intraoperative cholangiogram.  The pathology report confirmed Acute and chronic cholecystitis and cholecystitis.  The patient reports that they are feeling well with normal bowel movements and good appetite.  The pre-operative symptoms of abdominal pain, nausea, and vomiting have resolved.    Physical examination - Incisions appear well-healed with no sign of infection or bleeding.   Abdomen - soft, non-tender  Impression:  s/p laparoscopic cholecystectomy  Plan:  He may resume a regular diet and full activity.  He may follow-up on a PRN basis.

## 2012-02-02 NOTE — Patient Instructions (Signed)
Call back if you have any problems. 

## 2012-02-04 ENCOUNTER — Encounter (HOSPITAL_COMMUNITY): Payer: Self-pay | Admitting: Surgery

## 2012-02-26 ENCOUNTER — Ambulatory Visit (INDEPENDENT_AMBULATORY_CARE_PROVIDER_SITE_OTHER): Payer: BC Managed Care – PPO | Admitting: Internal Medicine

## 2012-02-26 DIAGNOSIS — Z0000 Encounter for general adult medical examination without abnormal findings: Secondary | ICD-10-CM

## 2012-02-26 LAB — CBC WITH DIFFERENTIAL/PLATELET
Basophils Absolute: 0.1 10*3/uL (ref 0.0–0.1)
Eosinophils Relative: 3.6 % (ref 0.0–5.0)
HCT: 42.5 % (ref 39.0–52.0)
Lymphocytes Relative: 28.8 % (ref 12.0–46.0)
Monocytes Relative: 6.9 % (ref 3.0–12.0)
Platelets: 316 10*3/uL (ref 150.0–400.0)
RDW: 13.4 % (ref 11.5–14.6)
WBC: 7.7 10*3/uL (ref 4.5–10.5)

## 2012-02-26 LAB — HEPATIC FUNCTION PANEL
ALT: 32 U/L (ref 0–53)
AST: 17 U/L (ref 0–37)
Alkaline Phosphatase: 37 U/L — ABNORMAL LOW (ref 39–117)
Bilirubin, Direct: 0 mg/dL (ref 0.0–0.3)
Total Bilirubin: 0.4 mg/dL (ref 0.3–1.2)

## 2012-02-26 LAB — LIPID PANEL
HDL: 47 mg/dL (ref >39.00)
Total CHOL/HDL Ratio: 5
Triglycerides: 191 mg/dL — ABNORMAL HIGH (ref 0.0–149.0)

## 2012-02-26 LAB — TSH: TSH: 0.39 u[IU]/mL (ref 0.35–5.50)

## 2012-02-26 NOTE — Progress Notes (Signed)
  Subjective:    Patient ID: Bryan [name], male    DOB: [birth year], 51 y.o.   MRN: [id number]  HPI CPX S/p cholecystectomy last month  Past Medical History: Hyperlipidemia Sleep apnea--- per pt: sleep study (Dr Shelle Iron)--- on CPAP  H/o depression  History of hemorrhoids  Past Surgical History: Vasectomy Cholecystectomy 01-2012   Family History: Mother 37 - htn, cervical cancer Father 41 - renal dz Brother - leukemia kidney cancer-- F (surgery 2010)  DM - no CAD - no stroke - no colon Ca - m  aunt prostate Ca - no  Social History: Married, 3 children tobacco--no ETOH-- socially diet -- struggling, recent use of "HCG" helped temporarily; has talk to a nutritionist before and done  psychotherapy  exercise-- active, gym x4/week Occupation: manufactoring rep, A/C units     Review of Systems  Respiratory: Negative for cough and shortness of breath.   Cardiovascular: Negative for chest pain and leg swelling.  Gastrointestinal: Negative for abdominal pain and blood in stool.  Genitourinary: Negative for dysuria, hematuria and difficulty urinating.  Psychiatric/Behavioral:       No depression or anxiety        Objective:   Physical Exam  General:  alert, well-developed, and overweight appearing  Neck-- no thyromegaly.   Lungs:  normal respiratory effort, no intercostal retractions, no accessory muscle use, and normal breath sounds.   Heart:  normal rate, regular rhythm, no murmur, and no gallop.   Abdomen:  soft, non-tender, no distention, no masses, no guarding, and no rigidity.   Extremities:  no edema, calves symmetric  Psych:  Cognition and judgment appear intact. Alert and cooperative with normal attention span and concentration.  not anxious appearing and not depressed appearing.      Assessment & Plan:

## 2012-02-26 NOTE — Patient Instructions (Signed)
Weight Watchers, [employer], Optifast ? Bariatric surgery ? [facility] [address]  [place], [place] [address] [phone]

## 2012-02-26 NOTE — Assessment & Plan Note (Addendum)
Td 2011 Mat. Aunt had colon Ca.Marland KitchenMarland KitchenMarland KitchenColonoscopy: aprox  12/07/2002,  normal . Next at age 51 per pt His main concern today is his weight, see social history, has tried several options and nothing seems to be working. We discussed Weight Watchers, [employer], Optifast and bariatric surgery. Doing great w/  Exercise. Also complained of a small area of decreased feeling and the left anterior tight. Denies any claudication, exam showed normal femoral pulses. 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

## 2012-02-28 ENCOUNTER — Encounter: Payer: Self-pay | Admitting: Internal Medicine

## 2013-02-27 ENCOUNTER — Ambulatory Visit (INDEPENDENT_AMBULATORY_CARE_PROVIDER_SITE_OTHER): Payer: 59 | Admitting: Internal Medicine

## 2013-02-27 ENCOUNTER — Encounter: Payer: Self-pay | Admitting: Internal Medicine

## 2013-02-27 VITALS — BP 132/74 | HR 70 | Temp 97.9°F | Ht 71.5 in | Wt 304.0 lb

## 2013-02-27 DIAGNOSIS — J309 Allergic rhinitis, unspecified: Secondary | ICD-10-CM | POA: Insufficient documentation

## 2013-02-27 DIAGNOSIS — G4700 Insomnia, unspecified: Secondary | ICD-10-CM | POA: Insufficient documentation

## 2013-02-27 DIAGNOSIS — L723 Sebaceous cyst: Secondary | ICD-10-CM

## 2013-02-27 DIAGNOSIS — Z0000 Encounter for general adult medical examination without abnormal findings: Secondary | ICD-10-CM

## 2013-02-27 LAB — COMPREHENSIVE METABOLIC PANEL
ALT: 30 U/L (ref 0–53)
BUN: 14 mg/dL (ref 6–23)
CO2: 28 mEq/L (ref 19–32)
Calcium: 9.4 mg/dL (ref 8.4–10.5)
Chloride: 101 mEq/L (ref 96–112)
Creatinine, Ser: 0.9 mg/dL (ref 0.4–1.5)
GFR: 93.84 mL/min (ref >60.00)
Glucose, Bld: 102 mg/dL — ABNORMAL HIGH (ref 70–99)
Total Bilirubin: 0.5 mg/dL (ref 0.3–1.2)

## 2013-02-27 LAB — LIPID PANEL
Cholesterol: 256 mg/dL — ABNORMAL HIGH (ref 0–200)
HDL: 41.6 mg/dL (ref >39.00)
Total CHOL/HDL Ratio: 6
Triglycerides: 301 mg/dL — ABNORMAL HIGH (ref 0.0–149.0)

## 2013-02-27 LAB — CBC WITH DIFFERENTIAL/PLATELET
Basophils Absolute: 0.1 10*3/uL (ref 0.0–0.1)
Eosinophils Relative: 2.2 % (ref 0.0–5.0)
Hemoglobin: 14.8 g/dL (ref 13.0–17.0)
Lymphocytes Relative: 25.8 % (ref 12.0–46.0)
Monocytes Relative: 7.3 % (ref 3.0–12.0)
Neutro Abs: 4.5 10*3/uL (ref 1.4–7.7)
RDW: 13.4 % (ref 11.5–14.6)
WBC: 7.1 10*3/uL (ref 4.5–10.5)

## 2013-02-27 LAB — TSH: TSH: 1.37 u[IU]/mL (ref 0.35–5.50)

## 2013-02-27 MED ORDER — ZOLPIDEM TARTRATE ER 12.5 MG PO TBCR: 12.5000 mg | EXTENDED_RELEASE_TABLET | Freq: Every evening | ORAL | Status: DC | PRN

## 2013-02-27 MED ORDER — FLUTICASONE PROPIONATE 50 MCG/ACT NA SUSP: 2.0000 | Freq: Every day | NASAL | Status: DC

## 2013-02-27 NOTE — Assessment & Plan Note (Signed)
Td 2011 Mat. Aunt had colon Ca.Marland KitchenMarland KitchenMarland KitchenColonoscopy: aprox  12/07/2002,  normal . Next at age 52 per pt Obesity, having failed a number of diets, BMI 41, I think is a candidate for bariatric surgery, recommend to consult with a surgeon. Doing great w/  Exercise. Labs

## 2013-02-27 NOTE — Assessment & Plan Note (Signed)
  Mass in the upper back, likely a sebaceous cyst, is slightly hard to palpation but not attached to deeper  structures. Refer to general surgery for consideration of excision.

## 2013-02-27 NOTE — Assessment & Plan Note (Signed)
Difficulty with sleeping, see HPI, i see no  contraindication to use Ambien CR.

## 2013-02-27 NOTE — Patient Instructions (Addendum)
See the orthopedic doctor for foot pain. Start Ambien CR, read the sleep habits recommendations. Flonase, saline irrigation. ---- Next visit in 6 months. -----  [facility] [address] [place], [place] [address]

## 2013-02-27 NOTE — Progress Notes (Signed)
Subjective:    Patient ID: Bryan [name], male    DOB: [birth year], 52 y.o.   MRN: [id number]  HPI CPX  Past Medical History  Diagnosis Date  . Hyperlipidemia   . H/O: depression   . Sleep apnea     CPAP  . H/O hemorrhoids    Past Surgical History  Procedure Laterality Date  . Tonsillectomy and adenoidectomy    . Cholecystectomy  01/12/2012    Procedure: LAPAROSCOPIC CHOLECYSTECTOMY WITH INTRAOPERATIVE CHOLANGIOGRAM;  Surgeon: Wilmon Arms. Corliss Skains, MD;  Location: [facility];  Service: General;  Laterality: N/A;   History   Social History  . Marital Status: Married    Spouse Name: N/A    Number of Children: N/A  . Years of Education: N/A   Occupational History  .  manufactoring rep, A/C units     Social History Main Topics  . Smoking status: Never Smoker   . Smokeless tobacco: Never Used  . Alcohol Use: Yes     Comment: occasional, 3 - 4 per week  . Drug Use: No  . Sexually Active: Not on file   Other Topics Concern  . Not on file   Social History Narrative   diet --continue having a hard time, took HCG, helped temporarily   exercise-- active,walks 2 miles a day            Family History  Problem Relation Age of Onset  . Cancer Mother     ovarian  . Cancer Brother     leukemia  . Colon cancer Maternal Aunt 50  . Cancer Paternal Grandmother     breast  . Diabetes Neg Hx   . CAD Neg Hx   . Stroke Neg Hx   . Cancer - Other Father 28    renal  . Hypertension Mother      Review of Systems In general doing well Complains of nasal congestion for several weeks along with left ear fullness. Denies fever chills. No green nasal discharge. Taking OTC antihistamine it. No chest pain or shortness or breath No nausea, vomiting, diarrhea or blood in the stools. No depression. Complaint is insomnia, Ambien CR?[name] Has a cyst at the upper back, causes some discomfort, slightly larger?.     Objective:   Physical Exam BP 132/74  Pulse 70  Temp(Src) 97.9 F (36.6 C)  (Oral)  Ht 5' 11.5" (1.816 m)  Wt 304 lb (137.893 kg)  BMI 41.81 kg/m2  SpO2 97%  General -- alert, well-developed, BMI 41 .   Neck --no thyromegaly  ; has a hard, 2.5 cm cyst in the upper back. HEENT: Nose is slightly congested, right tympanic membrane normal, left tympanic membrane bulge, not read. No discharge.  Lungs -- normal respiratory effort, no intercostal retractions, no accessory muscle use, and normal breath sounds.   Heart-- normal rate, regular rhythm, no murmur, and no gallop.   Abdomen--soft, non-tender, no distention, no masses, no HSM, no guarding, and no rigidity.   Extremities-- no pretibial edema bilaterally Rectal-- No external abnormalities noted. Normal sphincter tone. No rectal masses or tenderness. Brown no stools found.  Prostate exam is quite limited due to patient's size but normal.  Neurologic-- alert & oriented X3 and strength normal in all extremities. Psych-- Cognition and judgment appear intact. Alert and cooperative with normal attention span and concentration.  not anxious appearing and not depressed appearing.       Assessment & Plan:    Also complains of right foot pain, recommend  to see ortho

## 2013-02-27 NOTE — Assessment & Plan Note (Signed)
  Allergic rhinitis: Start Flonase, nasal irrigations, if not better in for 5 weeks, recommend to see ENT. If better, continue the Flonase.

## 2013-03-02 ENCOUNTER — Telehealth: Payer: Self-pay | Admitting: *Deleted

## 2013-03-02 NOTE — Telephone Encounter (Signed)
Prior auth initiated.

## 2013-03-03 NOTE — Telephone Encounter (Signed)
Form completed & faxed back to [employer].

## 2013-03-07 NOTE — Telephone Encounter (Signed)
lmovm for pt to return call.  

## 2013-03-07 NOTE — Telephone Encounter (Signed)
Advise patient about his insurance decision. Okay with me to try generic Ambien 10 mg one by mouth each bedtime if so desired. #30 and 2 refills

## 2013-03-07 NOTE — Telephone Encounter (Signed)
Prior auth for zolpidem tartrate er was denied. Alliteratives listed are zolpidem Ambien or zaleplon Sonata. Please advise.

## 2013-03-08 ENCOUNTER — Ambulatory Visit (INDEPENDENT_AMBULATORY_CARE_PROVIDER_SITE_OTHER): Payer: BC Managed Care – PPO | Admitting: General Surgery

## 2013-03-08 NOTE — Telephone Encounter (Signed)
lmovm for pt to return call.  

## 2013-03-10 NOTE — Telephone Encounter (Signed)
lmovm for pt to return call.  

## 2013-03-13 MED ORDER — ZOLPIDEM TARTRATE 10 MG PO TABS: 10.0000 mg | ORAL_TABLET | Freq: Every evening | ORAL | Status: DC | PRN

## 2013-03-13 NOTE — Telephone Encounter (Signed)
rx sent to pharmacy

## 2013-03-16 ENCOUNTER — Encounter (INDEPENDENT_AMBULATORY_CARE_PROVIDER_SITE_OTHER): Payer: Self-pay | Admitting: Surgery

## 2013-03-16 ENCOUNTER — Ambulatory Visit (INDEPENDENT_AMBULATORY_CARE_PROVIDER_SITE_OTHER): Payer: 59 | Admitting: Surgery

## 2013-03-16 VITALS — BP 125/83 | HR 72 | Temp 97.8°F | Resp 16 | Ht 71.0 in | Wt 307.8 lb

## 2013-03-16 DIAGNOSIS — L723 Sebaceous cyst: Secondary | ICD-10-CM

## 2013-03-16 NOTE — Progress Notes (Signed)
Patient ID: Bryan [name], male   DOB: [birth year], 52 y.o.   MRN: [id number]  Chief Complaint  Patient presents with  . Cyst    HPI Bryan [name] is a 52 y.o. male.  Referred by Dr. Willow [name] for large mass on back  HPI This is a healthy 52 year old male who presents with an enlarging mass on his upper back. This has been present for least 2 years periods become more firm and uncomfortable. It has never become infected. He recently had a physical and Dr. [name] now refers him for excision of this mass.  Past Medical History  Diagnosis Date  . Hyperlipidemia   . H/O: depression   . Sleep apnea     CPAP  . H/O hemorrhoids     Past Surgical History  Procedure Laterality Date  . Tonsillectomy and adenoidectomy    . Cholecystectomy  01/12/2012    Procedure: LAPAROSCOPIC CHOLECYSTECTOMY WITH INTRAOPERATIVE CHOLANGIOGRAM;  Surgeon: Bryan Arms. Corliss Skains, MD;  Location: [facility];  Service: General;  Laterality: N/A;    Family History  Problem Relation Age of Onset  . Cancer Mother     ovarian  . Cancer Brother     leukemia  . Colon cancer Maternal Aunt 50  . Cancer Paternal Grandmother     breast  . Diabetes Neg Hx   . CAD Neg Hx   . Stroke Neg Hx   . Cancer - Other Father 1    renal  . Hypertension Mother     Social History History  Substance Use Topics  . Smoking status: Never Smoker   . Smokeless tobacco: Never Used  . Alcohol Use: Yes     Comment: occasional, 3 - 4 per week    No Known Allergies  Current Outpatient Prescriptions  Medication Sig Dispense Refill  . aspirin 81 MG tablet Take 81 mg by mouth daily.      . fish oil-omega-3 fatty acids 1000 MG capsule Take 1 g by mouth daily.      . fluticasone (FLONASE) 50 MCG/ACT nasal spray Place 2 sprays into the nose daily.  16 g  6  . Green Coffee Bean 400 MG CAPS Take 1 capsule by mouth daily.      . Multiple Vitamins-Minerals (CENTRUM PO) Take 1 tablet by mouth daily.      . simethicone (GAS-X) 80 MG chewable tablet  Chew 160 mg by mouth every 6 (six) hours as needed. For stomach pain      . zolpidem (AMBIEN) 10 MG tablet Take 1 tablet (10 mg total) by mouth at bedtime as needed for sleep.  30 tablet  2   No current facility-administered medications for this visit.    Review of Systems Review of Systems  Constitutional: Negative for fever, chills and unexpected weight change.  HENT: Negative for hearing loss, congestion, sore throat, trouble swallowing and voice change.   Eyes: Negative for visual disturbance.  Respiratory: Negative for cough and wheezing.   Cardiovascular: Negative for chest pain, palpitations and leg swelling.  Gastrointestinal: Negative for nausea, vomiting, abdominal pain, diarrhea, constipation, blood in stool, abdominal distention, anal bleeding and rectal pain.  Genitourinary: Negative for hematuria and difficulty urinating.  Musculoskeletal: Negative for arthralgias.  Skin: Negative for rash and wound.  Neurological: Negative for seizures, syncope, weakness and headaches.  Hematological: Negative for adenopathy. Does not bruise/bleed easily.  Psychiatric/Behavioral: Negative for confusion.    Blood pressure 125/83, pulse 72, temperature 97.8 F (36.6 C), temperature source  Temporal, resp. rate 16, height 5\' 11"  (1.803 m), weight 307 lb 12.8 oz (139.617 kg).  Physical Exam Physical Exam WDWN in NAD HEENT:  EOMI, sclera anicteric Neck:  No masses, no thyromegaly Lungs:  CTA bilaterally; normal respiratory effort CV:  Regular rate and rhythm; no murmurs Abd:  +bowel sounds, soft, non-tender, no masses Ext:  Well-perfused; no edema Skin:  Upper back near base of neck - 4 cm protruding subcutaneous mass - fairly firm; no sign of infection or drainage  Data Reviewed none  Assessment    Probable large sebaceous cyst of the upper back     Plan    Excision under anesthesia.  The surgical procedure has been discussed with the patient.  Potential risks, benefits,  alternative treatments, and expected outcomes have been explained.  All of the patient's questions at this time have been answered.  The likelihood of reaching the patient's treatment goal is good.  The patient understand the proposed surgical procedure and wishes to proceed.          Bryan [name] K. 03/16/2013, 11:00 AM

## 2013-04-06 DIAGNOSIS — L723 Sebaceous cyst: Secondary | ICD-10-CM

## 2013-04-06 HISTORY — DX: Sebaceous cyst: L72.3

## 2013-04-27 ENCOUNTER — Telehealth (INDEPENDENT_AMBULATORY_CARE_PROVIDER_SITE_OTHER): Payer: Self-pay | Admitting: General Surgery

## 2013-04-27 ENCOUNTER — Encounter (HOSPITAL_BASED_OUTPATIENT_CLINIC_OR_DEPARTMENT_OTHER): Payer: Self-pay | Admitting: *Deleted

## 2013-04-27 NOTE — Telephone Encounter (Signed)
LMOM for patient to call back and ask for [facility]. Need to move patient off of the 05-19-13 to the 05-17-13

## 2013-05-02 ENCOUNTER — Encounter (INDEPENDENT_AMBULATORY_CARE_PROVIDER_SITE_OTHER): Payer: 59 | Admitting: Surgery

## 2013-05-04 ENCOUNTER — Encounter (HOSPITAL_BASED_OUTPATIENT_CLINIC_OR_DEPARTMENT_OTHER): Payer: Self-pay | Admitting: Anesthesiology

## 2013-05-04 ENCOUNTER — Ambulatory Visit (HOSPITAL_BASED_OUTPATIENT_CLINIC_OR_DEPARTMENT_OTHER): Payer: 59 | Admitting: Anesthesiology

## 2013-05-04 ENCOUNTER — Encounter (HOSPITAL_BASED_OUTPATIENT_CLINIC_OR_DEPARTMENT_OTHER)
Admission: RE | Disposition: A | Discharge status: Home / Self Care | Payer: Self-pay | Source: Ambulatory Visit | Attending: Surgery

## 2013-05-04 ENCOUNTER — Ambulatory Visit (HOSPITAL_BASED_OUTPATIENT_CLINIC_OR_DEPARTMENT_OTHER)
Admission: RE | Admit: 2013-05-04 | Discharge: 2013-05-04 | Disposition: A | Discharge status: Home / Self Care | Payer: 59 | Source: Ambulatory Visit | Attending: Surgery | Admitting: Surgery

## 2013-05-04 DIAGNOSIS — G4730 Sleep apnea, unspecified: Secondary | ICD-10-CM | POA: Insufficient documentation

## 2013-05-04 DIAGNOSIS — Z7982 Long term (current) use of aspirin: Secondary | ICD-10-CM | POA: Insufficient documentation

## 2013-05-04 DIAGNOSIS — R229 Localized swelling, mass and lump, unspecified: Secondary | ICD-10-CM

## 2013-05-04 DIAGNOSIS — D2360 Other benign neoplasm of skin of unspecified upper limb, including shoulder: Secondary | ICD-10-CM

## 2013-05-04 DIAGNOSIS — L089 Local infection of the skin and subcutaneous tissue, unspecified: Secondary | ICD-10-CM | POA: Insufficient documentation

## 2013-05-04 DIAGNOSIS — E785 Hyperlipidemia, unspecified: Secondary | ICD-10-CM | POA: Insufficient documentation

## 2013-05-04 DIAGNOSIS — L905 Scar conditions and fibrosis of skin: Secondary | ICD-10-CM | POA: Insufficient documentation

## 2013-05-04 DIAGNOSIS — Z79899 Other long term (current) drug therapy: Secondary | ICD-10-CM | POA: Insufficient documentation

## 2013-05-04 HISTORY — PX: EAR CYST EXCISION: SHX22

## 2013-05-04 HISTORY — DX: Other seasonal allergic rhinitis: J30.2

## 2013-05-04 HISTORY — DX: Dental restoration status: Z98.811

## 2013-05-04 HISTORY — DX: Sebaceous cyst: L72.3

## 2013-05-04 LAB — POCT HEMOGLOBIN-HEMACUE: Hemoglobin: 15.5 g/dL (ref 13.0–17.0)

## 2013-05-04 SURGERY — CYST REMOVAL
Anesthesia: General | Site: Back | Wound class: Clean

## 2013-05-04 MED ORDER — PROPOFOL 10 MG/ML IV BOLUS
INTRAVENOUS | Status: DC | PRN
  Administered 2013-05-04: 250 mg via INTRAVENOUS

## 2013-05-04 MED ORDER — MIDAZOLAM HCL 2 MG/ML PO SYRP: 12.0000 mg | ORAL_SOLUTION | Freq: Once | ORAL | Status: DC | PRN

## 2013-05-04 MED ORDER — OXYCODONE HCL 5 MG/5ML PO SOLN: 5.0000 mg | Freq: Once | ORAL | Status: DC | PRN

## 2013-05-04 MED ORDER — BUPIVACAINE HCL (PF) 0.25 % IJ SOLN: INTRAMUSCULAR | Status: DC | PRN

## 2013-05-04 MED ORDER — HYDROCODONE-ACETAMINOPHEN 5-325 MG PO TABS: 1.0000 | ORAL_TABLET | ORAL | Status: DC | PRN

## 2013-05-04 MED ORDER — LACTATED RINGERS IV SOLN
INTRAVENOUS | Status: DC
  Administered 2013-05-04 (×2): via INTRAVENOUS

## 2013-05-04 MED ORDER — ONDANSETRON HCL 4 MG/2ML IJ SOLN: 4.0000 mg | INTRAMUSCULAR | Status: DC | PRN

## 2013-05-04 MED ORDER — MIDAZOLAM HCL 5 MG/5ML IJ SOLN
INTRAMUSCULAR | Status: DC | PRN
  Administered 2013-05-04: 2 mg via INTRAVENOUS

## 2013-05-04 MED ORDER — MORPHINE SULFATE 2 MG/ML IJ SOLN: 2.0000 mg | INTRAMUSCULAR | Status: DC | PRN

## 2013-05-04 MED ORDER — DEXAMETHASONE SODIUM PHOSPHATE 4 MG/ML IJ SOLN
INTRAMUSCULAR | Status: DC | PRN
  Administered 2013-05-04: 10 mg via INTRAVENOUS

## 2013-05-04 MED ORDER — HYDROMORPHONE HCL PF 1 MG/ML IJ SOLN: 0.2500 mg | INTRAMUSCULAR | Status: DC | PRN

## 2013-05-04 MED ORDER — FENTANYL CITRATE 0.05 MG/ML IJ SOLN: 50.0000 ug | INTRAMUSCULAR | Status: DC | PRN

## 2013-05-04 MED ORDER — SUCCINYLCHOLINE CHLORIDE 20 MG/ML IJ SOLN
INTRAMUSCULAR | Status: DC | PRN
  Administered 2013-05-04: 100 mg via INTRAVENOUS

## 2013-05-04 MED ORDER — LIDOCAINE HCL (CARDIAC) 10 MG/ML IV SOLN
INTRAVENOUS | Status: DC | PRN
Start: 2013-05-04 — End: 2013-05-04
  Administered 2013-05-04: 60 mg via INTRAVENOUS

## 2013-05-04 MED ORDER — BUPIVACAINE-EPINEPHRINE 0.25% -1:200000 IJ SOLN
INTRAMUSCULAR | Status: DC | PRN
  Administered 2013-05-04: 10 mL

## 2013-05-04 MED ORDER — FENTANYL CITRATE 0.05 MG/ML IJ SOLN
INTRAMUSCULAR | Status: DC | PRN
  Administered 2013-05-04: 100 ug via INTRAVENOUS
  Administered 2013-05-04: 50 ug via INTRAVENOUS

## 2013-05-04 MED ORDER — MIDAZOLAM HCL 2 MG/2ML IJ SOLN: 1.0000 mg | INTRAMUSCULAR | Status: DC | PRN

## 2013-05-04 MED ORDER — OXYCODONE HCL 5 MG PO TABS: 5.0000 mg | ORAL_TABLET | Freq: Once | ORAL | Status: DC | PRN

## 2013-05-04 MED ORDER — CHLORHEXIDINE GLUCONATE 4 % EX LIQD: 1.0000 "application " | Freq: Once | CUTANEOUS | Status: DC

## 2013-05-04 SURGICAL SUPPLY — 38 items
APL SKNCLS STERI-STRIP NONHPOA (GAUZE/BANDAGES/DRESSINGS) ×1
BENZOIN TINCTURE PRP APPL 2/3 (GAUZE/BANDAGES/DRESSINGS) ×2 IMPLANT
BLADE SURG 15 STRL LF DISP TIS (BLADE) ×1 IMPLANT
BLADE SURG 15 STRL SS (BLADE) ×2
CANISTER SUCTION 1200CC (MISCELLANEOUS) IMPLANT
CHLORAPREP W/TINT 26ML (MISCELLANEOUS) ×2 IMPLANT
CLOTH BEACON ORANGE TIMEOUT ST (SAFETY) ×2 IMPLANT
COVER MAYO STAND STRL (DRAPES) ×2 IMPLANT
COVER TABLE BACK 60X90 (DRAPES) ×2 IMPLANT
DECANTER SPIKE VIAL GLASS SM (MISCELLANEOUS) ×2 IMPLANT
DRAPE PED LAPAROTOMY (DRAPES) ×2 IMPLANT
DRAPE UTILITY XL STRL (DRAPES) ×2 IMPLANT
ELECT COATED BLADE 2.86 ST (ELECTRODE) ×2 IMPLANT
ELECT REM PT RETURN 9FT ADLT (ELECTROSURGICAL) ×2
ELECTRODE REM PT RTRN 9FT ADLT (ELECTROSURGICAL) ×1 IMPLANT
GAUZE SPONGE 4X4 12PLY STRL LF (GAUZE/BANDAGES/DRESSINGS) IMPLANT
GLOVE BIO SURGEON STRL SZ7 (GLOVE) ×2 IMPLANT
GLOVE BIOGEL PI IND STRL 7.5 (GLOVE) ×1 IMPLANT
GLOVE BIOGEL PI INDICATOR 7.5 (GLOVE) ×2
GLOVE EXAM NITRILE MD LF STRL (GLOVE) ×1 IMPLANT
GLOVE SURG SS PI 7.5 STRL IVOR (GLOVE) ×1 IMPLANT
GOWN PREVENTION PLUS XLARGE (GOWN DISPOSABLE) ×3 IMPLANT
NDL HYPO 25X1 1.5 SAFETY (NEEDLE) ×1 IMPLANT
NEEDLE HYPO 25X1 1.5 SAFETY (NEEDLE) ×2 IMPLANT
NS IRRIG 1000ML POUR BTL (IV SOLUTION) IMPLANT
PACK BASIN DAY SURGERY FS (CUSTOM PROCEDURE TRAY) ×2 IMPLANT
PENCIL BUTTON HOLSTER BLD 10FT (ELECTRODE) ×2 IMPLANT
STRIP CLOSURE SKIN 1/2X4 (GAUZE/BANDAGES/DRESSINGS) ×2 IMPLANT
SUT MON AB 4-0 PC3 18 (SUTURE) ×1 IMPLANT
SUT PROLENE 6 0 P 1 18 (SUTURE) IMPLANT
SUT VIC AB 3-0 SH 27 (SUTURE) ×2
SUT VIC AB 3-0 SH 27X BRD (SUTURE) IMPLANT
SUT VICRYL 3-0 CR8 SH (SUTURE) IMPLANT
SYR CONTROL 10ML LL (SYRINGE) ×2 IMPLANT
TOWEL OR 17X24 6PK STRL BLUE (TOWEL DISPOSABLE) ×2 IMPLANT
TOWEL OR NON WOVEN STRL DISP B (DISPOSABLE) ×2 IMPLANT
TUBE CONNECTING 20X1/4 (TUBING) IMPLANT
YANKAUER SUCT BULB TIP NO VENT (SUCTIONS) IMPLANT

## 2013-05-04 NOTE — Op Note (Signed)
Pre-op Diagnosis:  Subcutaneous mass - posterior neck 4cm Post-op Diagnosis:  Same Procedure: excision of subcutaneous mass - posterior neck Surgeon: Yuuki Skeens K. Anesthesia:  GETT  Indications: This is a 52 year old male who presents with a slowly enlarging mass in his upper back at the base of his neck. This has been present for some time and is beginning to cause some discomfort. On examination this was felt to possibly represent a large sebaceous cyst versus lipoma. His brought to the operating room for excision.  Description of procedure: The patient brought to the operating room placed in supine position on the stretcher. After an adequate level of general anesthesia was obtained, the patient was positioned prone position on the operating room table. The area over the mass at the base of his neck was prepped with ChloraPrep and draped sterile fashion. A timeout was taken to ensure the proper patient proper procedure. We infiltrated the area around the mass with 0.25% Marcaine with epinephrine. I made a transverse incision over the mass. Dissection was carried down to the surface of the mass. On further examination this was felt to represent a fairly firm lipoma. We bluntly dissected completely around the lipoma and removed entirely with an intact capsule. This was sent for pathologic examination. We inspected for hemostasis. The wound was closed with a deep layer of 3-0 Vicryl. A subcuticular layer of 4-0 Monocryl was used to close the skin. Steri-Strips and clean dressings were applied. The patient was extubated and brought to recovery in stable condition. All sponge, initially, and needle counts are correct.  Wilmon Arms. Corliss Skains, MD, [facility]  General/ Trauma Surgery  05/04/2013 11:00 AM

## 2013-05-04 NOTE — H&P (Signed)
HPI  Bryan [name] is a 52 y.o. male. Referred by Dr. Willow Ora for large mass on back  HPI  This is a healthy 52 year old male who presents with an enlarging mass on his upper back. This has been present for least 2 years periods become more firm and uncomfortable. It has never become infected. He recently had a physical and Dr. Drue Novel now refers him for excision of this mass.  Past Medical History   Diagnosis  Date   .  Hyperlipidemia    .  H/O: depression    .  Sleep apnea      CPAP   .  H/O hemorrhoids     Past Surgical History   Procedure  Laterality  Date   .  Tonsillectomy and adenoidectomy     .  Cholecystectomy   01/12/2012     Procedure: LAPAROSCOPIC CHOLECYSTECTOMY WITH INTRAOPERATIVE CHOLANGIOGRAM; Surgeon: Wilmon Arms. Corliss Skains, MD; Location: [facility]; Service: General; Laterality: N/A;    Family History   Problem  Relation  Age of Onset   .  Cancer  Mother      ovarian   .  Cancer  Brother      leukemia   .  Colon cancer  Maternal Aunt  50   .  Cancer  Paternal Grandmother      breast   .  Diabetes  Neg Hx    .  CAD  Neg Hx    .  Stroke  Neg Hx    .  Cancer - Other  Father  65     renal   .  Hypertension  Mother    Social History  History   Substance Use Topics   .  Smoking status:  Never Smoker   .  Smokeless tobacco:  Never Used   .  Alcohol Use:  Yes      Comment: occasional, 3 - 4 per week   No Known Allergies  Current Outpatient Prescriptions   Medication  Sig  Dispense  Refill   .  aspirin 81 MG tablet  Take 81 mg by mouth daily.     .  fish oil-omega-3 fatty acids 1000 MG capsule  Take 1 g by mouth daily.     .  fluticasone (FLONASE) 50 MCG/ACT nasal spray  Place 2 sprays into the nose daily.  16 g  6   .  Green Coffee Bean 400 MG CAPS  Take 1 capsule by mouth daily.     .  Multiple Vitamins-Minerals (CENTRUM PO)  Take 1 tablet by mouth daily.     .  simethicone (GAS-X) 80 MG chewable tablet  Chew 160 mg by mouth every 6 (six) hours as needed. For stomach  pain     .  zolpidem (AMBIEN) 10 MG tablet  Take 1 tablet (10 mg total) by mouth at bedtime as needed for sleep.  30 tablet  2    No current facility-administered medications for this visit.   Review of Systems  Review of Systems  Constitutional: Negative for fever, chills and unexpected weight change.  HENT: Negative for hearing loss, congestion, sore throat, trouble swallowing and voice change.  Eyes: Negative for visual disturbance.  Respiratory: Negative for cough and wheezing.  Cardiovascular: Negative for chest pain, palpitations and leg swelling.  Gastrointestinal: Negative for nausea, vomiting, abdominal pain, diarrhea, constipation, blood in stool, abdominal distention, anal bleeding and rectal pain.  Genitourinary: Negative for hematuria and difficulty urinating.  Musculoskeletal: Negative  for arthralgias.  Skin: Negative for rash and wound.  Neurological: Negative for seizures, syncope, weakness and headaches.  Hematological: Negative for adenopathy. Does not bruise/bleed easily.  Psychiatric/Behavioral: Negative for confusion.  Blood pressure 125/83, pulse 72, temperature 97.8 F (36.6 C), temperature source Temporal, resp. rate 16, height 5\' 11"  (1.803 m), weight 307 lb 12.8 oz (139.617 kg).  Physical Exam  Physical Exam  WDWN in NAD  HEENT: EOMI, sclera anicteric  Neck: No masses, no thyromegaly  Lungs: CTA bilaterally; normal respiratory effort  CV: Regular rate and rhythm; no murmurs  Abd: +bowel sounds, soft, non-tender, no masses  Ext: Well-perfused; no edema  Skin: Upper back near base of neck - 4 cm protruding subcutaneous mass - fairly firm; no sign of infection or drainage  Data Reviewed  none  Assessment  Probable large sebaceous cyst of the upper back  Plan  Excision under anesthesia. The surgical procedure has been discussed with the patient. Potential risks, benefits, alternative treatments, and expected outcomes have been explained. All of the patient's  questions at this time have been answered. The likelihood of reaching the patient's treatment goal is good. The patient understand the proposed surgical procedure and wishes to proceed.   Wilmon Arms. Corliss Skains, MD, [facility]  General/ Trauma Surgery  05/04/2013 8:26 AM

## 2013-05-04 NOTE — Anesthesia Procedure Notes (Signed)
Procedure Name: Intubation Date/Time: 05/04/2013 10:21 AM Performed by: Gar Gibbon Pre-anesthesia Checklist: Patient identified, Emergency Drugs available, Suction available and Patient being monitored Patient Re-evaluated:Patient Re-evaluated prior to inductionOxygen Delivery Method: Circle System Utilized Preoxygenation: Pre-oxygenation with 100% oxygen Intubation Type: IV induction Ventilation: Mask ventilation without difficulty Laryngoscope Size: Miller and 3 Grade View: Grade III Tube type: Oral Tube size: 8.0 mm Number of attempts: 1 Airway Equipment and Method: stylet and oral airway Placement Confirmation: ETT inserted through vocal cords under direct vision,  positive ETCO2 and breath sounds checked- equal and bilateral Tube secured with: Tape Dental Injury: Teeth and Oropharynx as per pre-operative assessment

## 2013-05-04 NOTE — Transfer of Care (Signed)
Immediate Anesthesia Transfer of Care Note  Patient: Bryan [name]  Procedure(s) Performed: Procedure(s): Excision subcutaneous mass posterior neck (N/A)  Patient Location: PACU  Anesthesia Type:General  Level of Consciousness: sedated and patient cooperative  Airway & Oxygen Therapy: Patient Spontanous Breathing and Patient connected to face mask oxygen  Post-op Assessment: Report given to PACU RN and Post -op Vital signs reviewed and stable  Post vital signs: Reviewed and stable  Complications: No apparent anesthesia complications

## 2013-05-04 NOTE — Anesthesia Postprocedure Evaluation (Signed)
  Anesthesia Post-op Note  Patient: Bryan [name]  Procedure(s) Performed: Procedure(s): Excision subcutaneous mass posterior neck (N/A)  Patient Location: PACU  Anesthesia Type:General  Level of Consciousness: awake, alert  and oriented  Airway and Oxygen Therapy: Patient Spontanous Breathing  Post-op Pain: none  Post-op Assessment: Post-op Vital signs reviewed, Patient's Cardiovascular Status Stable, Respiratory Function Stable, Patent Airway and No signs of Nausea or vomiting  Post-op Vital Signs: Reviewed and stable  Complications: No apparent anesthesia complications

## 2013-05-04 NOTE — Discharge Instructions (Signed)
Ice pack as needed to the surgical site Use pain medicine as needed.  Use stool softeners to avoid constipation.  Remove the dressing on Saturday, then you may shower over the steri-strips.     Post Anesthesia Home Care Instructions  Activity: Get plenty of rest for the remainder of the day. A responsible adult should stay with you for 24 hours following the procedure.  For the next 24 hours, DO NOT: -Drive a car -Advertising copywriter -Drink alcoholic beverages -Take any medication unless instructed by your physician -Make any legal decisions or sign important papers.  Meals: Start with liquid foods such as gelatin or soup. Progress to regular foods as tolerated. Avoid greasy, spicy, heavy foods. If nausea and/or vomiting occur, drink only clear liquids until the nausea and/or vomiting subsides. Call your physician if vomiting continues.  Special Instructions/Symptoms: Your throat may feel dry or sore from the anesthesia or the breathing tube placed in your throat during surgery. If this causes discomfort, gargle with warm salt water. The discomfort should disappear within 24 hours.

## 2013-05-04 NOTE — Anesthesia Preprocedure Evaluation (Signed)
Anesthesia Evaluation  Patient identified by MRN, date of birth, ID band Patient awake    Reviewed: Allergy & Precautions, H&P , NPO status , Patient's Chart, lab work & pertinent test results  Airway Mallampati: II TM Distance: >3 FB Neck ROM: Full    Dental no notable dental hx. (+) Teeth Intact and Dental Advisory Given   Pulmonary sleep apnea and Continuous Positive Airway Pressure Ventilation ,  breath sounds clear to auscultation  Pulmonary exam normal       Cardiovascular negative cardio ROS  Rhythm:Regular Rate:Normal     Neuro/Psych negative neurological ROS  negative psych ROS   GI/Hepatic negative GI ROS, Neg liver ROS,   Endo/Other  negative endocrine ROSMorbid obesity  Renal/GU negative Renal ROS  negative genitourinary   Musculoskeletal   Abdominal   Peds  Hematology negative hematology ROS (+)   Anesthesia Other Findings   Reproductive/Obstetrics negative OB ROS                           Anesthesia Physical Anesthesia Plan  ASA: III  Anesthesia Plan: General   Post-op Pain Management:    Induction: Intravenous  Airway Management Planned: Oral ETT  Additional Equipment:   Intra-op Plan:   Post-operative Plan: Extubation in OR  Informed Consent: I have reviewed the patients History and Physical, chart, labs and discussed the procedure including the risks, benefits and alternatives for the proposed anesthesia with the patient or authorized representative who has indicated his/her understanding and acceptance.   Dental advisory given  Plan Discussed with: CRNA  Anesthesia Plan Comments:         Anesthesia Quick Evaluation

## 2013-05-05 ENCOUNTER — Encounter (HOSPITAL_BASED_OUTPATIENT_CLINIC_OR_DEPARTMENT_OTHER): Payer: Self-pay | Admitting: Surgery

## 2013-05-08 ENCOUNTER — Telehealth (INDEPENDENT_AMBULATORY_CARE_PROVIDER_SITE_OTHER): Payer: Self-pay | Admitting: General Surgery

## 2013-05-08 NOTE — Telephone Encounter (Signed)
Patient called back and was given below message.  Patient states understanding at this time and no questions.

## 2013-05-08 NOTE — Telephone Encounter (Signed)
LMOM for patient to call back and ask for [facility]. What to let patient know that his path showed the mass was benign. It might have been an old infection that  Formed a mass. Per Dr Corliss Skains.

## 2013-05-19 ENCOUNTER — Encounter (INDEPENDENT_AMBULATORY_CARE_PROVIDER_SITE_OTHER): Payer: 59 | Admitting: Surgery

## 2013-05-23 ENCOUNTER — Encounter (INDEPENDENT_AMBULATORY_CARE_PROVIDER_SITE_OTHER): Payer: 59 | Admitting: Surgery

## 2013-06-19 ENCOUNTER — Encounter (INDEPENDENT_AMBULATORY_CARE_PROVIDER_SITE_OTHER): Payer: Self-pay | Admitting: Surgery

## 2013-06-19 ENCOUNTER — Ambulatory Visit (INDEPENDENT_AMBULATORY_CARE_PROVIDER_SITE_OTHER): Payer: 59 | Admitting: Surgery

## 2013-06-19 VITALS — BP 138/96 | HR 80 | Temp 99.0°F | Resp 16 | Ht 71.0 in | Wt 314.8 lb

## 2013-06-19 DIAGNOSIS — L738 Other specified follicular disorders: Secondary | ICD-10-CM

## 2013-06-19 DIAGNOSIS — L739 Follicular disorder, unspecified: Secondary | ICD-10-CM

## 2013-06-19 NOTE — Progress Notes (Signed)
Status post excision of a mass from the base of his neck on 04/17/13. 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 No sign of malignancy. The wound is now healed quite well. The patient's states that the stitch was protruding but has fallen off. No sign of seroma or inflammation. He may resume full activity. Follow up as needed.  Bryan [name]. Bryan Skains, MD, [facility] Surgery  General/ Trauma Surgery  06/19/2013 2:10 PM

## 2013-07-06 ENCOUNTER — Encounter: Payer: Self-pay | Admitting: Internal Medicine

## 2013-07-06 NOTE — Telephone Encounter (Signed)
Please advise if this is ok to authorize.

## 2013-08-15 ENCOUNTER — Ambulatory Visit (INDEPENDENT_AMBULATORY_CARE_PROVIDER_SITE_OTHER): Payer: 59 | Admitting: Internal Medicine

## 2013-08-15 ENCOUNTER — Encounter: Payer: Self-pay | Admitting: Internal Medicine

## 2013-08-15 VITALS — BP 143/85 | HR 71 | Temp 98.4°F | Wt 315.2 lb

## 2013-08-15 DIAGNOSIS — E785 Hyperlipidemia, unspecified: Secondary | ICD-10-CM

## 2013-08-15 DIAGNOSIS — G4730 Sleep apnea, unspecified: Secondary | ICD-10-CM

## 2013-08-15 DIAGNOSIS — L723 Sebaceous cyst: Secondary | ICD-10-CM

## 2013-08-15 DIAGNOSIS — G4700 Insomnia, unspecified: Secondary | ICD-10-CM

## 2013-08-15 LAB — LIPID PANEL: Cholesterol: 244 mg/dL — ABNORMAL HIGH (ref 0–200)

## 2013-08-15 MED ORDER — ZOLPIDEM TARTRATE 10 MG PO TABS: 10.0000 mg | ORAL_TABLET | Freq: Every evening | ORAL | Status: DC | PRN

## 2013-08-15 NOTE — Assessment & Plan Note (Signed)
Status post excision, resolved

## 2013-08-15 NOTE — Progress Notes (Signed)
  Subjective:    Patient ID: Bryan [name], male    DOB: [birth year], 52 y.o.   MRN: [id number]  HPI Six-month followup Hyperlipidemia, has not make any significant changes in his lifestyle but plans to start an exercise program. Sleep apnea, has a old machine, has not been reassess lately , refer to Dr. Shelle Iron? Insomnia, on Ambien as needed, due for a refill.  Past Medical History  Diagnosis Date  . Hyperlipidemia     is being monitored every 6 month, no current med.  . Seasonal allergies   . Sleep apnea     uses CPAP nightly  . Dental crowns present   . Sebaceous cyst 04/2013    upper back   Past Surgical History  Procedure Laterality Date  . Cholecystectomy  01/12/2012    Procedure: LAPAROSCOPIC CHOLECYSTECTOMY WITH INTRAOPERATIVE CHOLANGIOGRAM;  Surgeon: Wilmon Arms. Corliss Skains, MD;  Location: [facility];  Service: General;  Laterality: N/A;  . Adenoidectomy      as a child  . Ear cyst excision N/A 05/04/2013    Procedure: Excision subcutaneous mass posterior neck;  Surgeon: Wilmon Arms. Corliss Skains, MD;  Location: [facility];  Service: General;  Laterality: N/A;  . Cyst excision      cyst removal from back     Review of Systems  Denies chest pain or shortness or breath. No nausea, vomiting, diarrhea fever Energy is labile, he did some significant hiking  during the summer w/o problems     Objective:   Physical Exam BP 143/85  Pulse 71  Temp(Src) 98.4 F (36.9 C)  Wt 315 lb 3.2 oz (142.974 kg)  BMI 43.98 kg/m2  SpO2 99% General -- alert, well-developed, NAD.  Lungs -- normal respiratory effort, no intercostal retractions, no accessory muscle use, and normal breath sounds.  Heart-- normal rate, regular rhythm, no murmur.  Extremities-- no pretibial edema bilaterally  Neurologic-- alert & oriented X3. Speech, gait normal.  Psych-- Cognition and judgment appear intact. Alert and cooperative with normal attention span and concentration. not anxious appearing and not  depressed appearing.         Assessment & Plan:

## 2013-08-15 NOTE — Assessment & Plan Note (Signed)
Refer to Dr Shelle Iron his settings have not been reviewed in a while, would like to have more modern CPAP

## 2013-08-15 NOTE — Assessment & Plan Note (Signed)
All previous results are reviewed, diet and exercise discussed. We agreed on checking FLP and A1c. He is willing to take medication if needed.

## 2013-08-15 NOTE — Assessment & Plan Note (Signed)
RF ambien, uses rarely as needed

## 2013-08-15 NOTE — Patient Instructions (Signed)
Get your blood work before you leave  Next visit in 6 months for a physical exam   Please make an appointment before you leave the office today (or call few weeks in advance)     

## 2013-08-18 ENCOUNTER — Other Ambulatory Visit: Payer: Self-pay | Admitting: *Deleted

## 2013-08-18 ENCOUNTER — Telehealth: Payer: Self-pay

## 2013-08-18 ENCOUNTER — Telehealth: Payer: Self-pay | Admitting: *Deleted

## 2013-08-18 DIAGNOSIS — E785 Hyperlipidemia, unspecified: Secondary | ICD-10-CM

## 2013-08-18 MED ORDER — ATORVASTATIN CALCIUM 40 MG PO TABS: 40.0000 mg | ORAL_TABLET | Freq: Every day | ORAL | Status: DC

## 2013-08-18 NOTE — Telephone Encounter (Signed)
Lab orders only 

## 2013-08-18 NOTE — Telephone Encounter (Signed)
Message copied by Eustace Quail on Fri Aug 18, 2013 12:22 PM ------      Message from: Willow Ora E      Created: Thu Aug 17, 2013  4:07 PM       Please call the patient:      His cholesterol continue to be elevated, also blood sugar tests show borderline elevation of blood sugar.      Recommend to start Lipitor 40 mg one tablet each bedtime #30 and 2 refills      Arrange for labs to be done in 6 weeks: FLP, AST, ALT hyperlipidemia ------

## 2013-08-18 NOTE — Telephone Encounter (Signed)
Pt notified of lab results via telephone and to start lipitor 40 mg.  Pt also scheduled for 10/02/13 for labs to be done. DJR

## 2013-08-30 ENCOUNTER — Encounter: Payer: Self-pay | Admitting: Internal Medicine

## 2013-08-30 ENCOUNTER — Ambulatory Visit (INDEPENDENT_AMBULATORY_CARE_PROVIDER_SITE_OTHER): Payer: 59 | Admitting: Internal Medicine

## 2013-08-30 VITALS — BP 132/88 | HR 72 | Temp 98.0°F | Wt 319.6 lb

## 2013-08-30 DIAGNOSIS — J069 Acute upper respiratory infection, unspecified: Secondary | ICD-10-CM

## 2013-08-30 MED ORDER — AMOXICILLIN 500 MG PO CAPS: 1000.0000 mg | ORAL_CAPSULE | Freq: Two times a day (BID) | ORAL | Status: DC

## 2013-08-30 MED ORDER — HYDROCOD POLST-CHLORPHEN POLST 10-8 MG/5ML PO LQCR: 5.0000 mL | Freq: Every evening | ORAL | Status: DC | PRN

## 2013-08-30 NOTE — Progress Notes (Signed)
  Subjective:    Patient ID: Bryan [name], male    DOB: 1961-04-16, 52 y.o.   MRN: [id number]  HPI Acute visit Symptoms started a week ago with sore throat, cough, greenish/yellow sputum, sinus and chest congestion and L>R ear discomfort.  Past Medical History  Diagnosis Date  . Hyperlipidemia     is being monitored every 6 month, no current med.  . Seasonal allergies   . Sleep apnea     uses CPAP nightly  . Dental crowns present   . Sebaceous cyst 04/2013    upper back   Past Surgical History  Procedure Laterality Date  . Cholecystectomy  01/12/2012    Procedure: LAPAROSCOPIC CHOLECYSTECTOMY WITH INTRAOPERATIVE CHOLANGIOGRAM;  Surgeon: Wilmon Arms. Corliss Skains, MD;  Location: [facility];  Service: General;  Laterality: N/A;  . Adenoidectomy      as a child  . Ear cyst excision N/A 05/04/2013    Procedure: Excision subcutaneous mass posterior neck;  Surgeon: Wilmon Arms. Corliss Skains, MD;  Location: [facility];  Service: General;  Laterality: N/A;  . Cyst excision      cyst removal from back     Review of Systems Denies fever or chills No  nausea, vomiting, diarrhea. He took a trip to the [facility], symptoms started before the trip.     Objective:   Physical Exam  BP 132/88  Pulse 72  Temp(Src) 98 F (36.7 C)  Wt 319 lb 9.6 oz (144.97 kg)  BMI 44.6 kg/m2  SpO2 98% General -- alert, well-developed, NAD.   HEENT-- Not pale. TMs bulge and slt red L>R; throat symmetric, no redness or discharge. Face symmetric, sinuses not tender to palpation. Nose moderate congested.  Lungs -- normal respiratory effort, no intercostal retractions, no accessory muscle use, and normal breath sounds.  Heart-- normal rate, regular rhythm, no murmur.   Neurologic-- alert & oriented X3. Speech normal, gait normal, strength normal in all extremities.   Psych-- Cognition and judgment appear intact. Cooperative with normal attention span and concentration. No anxious appearing , no depressed appearing.        Assessment & Plan:   URI, left otitis media: See instructions. Request something stronger than mucinex for nighttime cough, will prescribe Tussionex

## 2013-08-30 NOTE — Patient Instructions (Signed)
Rest, fluids , tylenol For cough, take Mucinex DM twice a day as needed  If the cough continue at night, use tussionex (will cause drowsiness) Take the antibiotic as prescribed  (Amoxicillin) Call if no better in few days Call anytime if the symptoms are severe

## 2013-08-31 ENCOUNTER — Encounter: Payer: Self-pay | Admitting: Internal Medicine

## 2013-09-18 ENCOUNTER — Ambulatory Visit (INDEPENDENT_AMBULATORY_CARE_PROVIDER_SITE_OTHER): Payer: 59 | Admitting: Pulmonary Disease

## 2013-09-18 ENCOUNTER — Encounter: Payer: Self-pay | Admitting: Pulmonary Disease

## 2013-09-18 VITALS — BP 118/82 | HR 91 | Temp 97.9°F | Ht 71.0 in | Wt 319.0 lb

## 2013-09-18 DIAGNOSIS — Z23 Encounter for immunization: Secondary | ICD-10-CM

## 2013-09-18 DIAGNOSIS — G4733 Obstructive sleep apnea (adult) (pediatric): Secondary | ICD-10-CM

## 2013-09-18 NOTE — Progress Notes (Signed)
  Subjective:    Patient ID: Bryan [name], male    DOB: 1961-09-14, 52 y.o.   MRN: [id number]  HPI The patient comes in today for followup of his obstructive sleep apnea.  He has not been seen in years, but has remained compliant on CPAP and has kept up with his new mask changes and supplies.  He is sleeping very well with his machine, and is satisfied with his daytime alertness.  His machine is approximately 15+ years old, and is starting to make some noises.  He states that his weight has increased significantly since the last visit, but in actuality he has been up and down over the years.   Review of Systems  Constitutional: Negative for fever and unexpected weight change.  HENT: Positive for ear pain. Negative for congestion, dental problem, nosebleeds, postnasal drip, rhinorrhea, sinus pressure, sneezing, sore throat and trouble swallowing.   Eyes: Negative for redness and itching.  Respiratory: Negative for cough, chest tightness, shortness of breath and wheezing.   Cardiovascular: Negative for palpitations and leg swelling.  Gastrointestinal: Negative for nausea and vomiting.  Genitourinary: Negative for dysuria.  Musculoskeletal: Negative for joint swelling.  Skin: Negative for rash.  Neurological: Negative for headaches.  Hematological: Does not bruise/bleed easily.  Psychiatric/Behavioral: Negative for dysphoric mood. The patient is not nervous/anxious.        Objective:   Physical Exam Obese male in no acute distress Nose without purulence or discharge noted Oropharynx clear, but elongated soft palate and uvula Large neck without lymphadenopathy or thyromegaly Lower extremities without edema, cyanosis Alert and oriented, moves all 4 extremities.  Does not appear to be sleepy.       Assessment & Plan:

## 2013-09-18 NOTE — Assessment & Plan Note (Signed)
The patient has not been seen in years, but has done very well on CPAP.  He has been compliant with the device, and is due for a new machine.  I have also encouraged him to keep up with his mask changes and supplies.  He also needs to work aggressively on weight loss.

## 2013-09-18 NOTE — Patient Instructions (Signed)
Will send an order to [facility] to show your various types of new machines.  Let me know if questions or concerns. Keep up with mask changes and supplies. Work on weight loss followup with me every 1-3 yrs, but call if having issues or questions.

## 2013-09-29 ENCOUNTER — Encounter: Payer: Self-pay | Admitting: Lab

## 2013-10-02 ENCOUNTER — Other Ambulatory Visit (INDEPENDENT_AMBULATORY_CARE_PROVIDER_SITE_OTHER): Payer: 59

## 2013-10-02 DIAGNOSIS — E785 Hyperlipidemia, unspecified: Secondary | ICD-10-CM

## 2013-10-02 LAB — LIPID PANEL
Cholesterol: 123 mg/dL (ref 0–200)
LDL Cholesterol: 52 mg/dL (ref 0–99)
Triglycerides: 150 mg/dL — ABNORMAL HIGH (ref 0.0–149.0)
VLDL: 30 mg/dL (ref 0.0–40.0)

## 2013-10-03 ENCOUNTER — Telehealth: Payer: Self-pay | Admitting: *Deleted

## 2013-10-03 DIAGNOSIS — E785 Hyperlipidemia, unspecified: Secondary | ICD-10-CM

## 2013-10-03 MED ORDER — ATORVASTATIN CALCIUM 40 MG PO TABS: 40.0000 mg | ORAL_TABLET | Freq: Every day | ORAL | Status: DC

## 2013-10-03 NOTE — Telephone Encounter (Signed)
Message copied by Eustace Quail on Tue Oct 03, 2013 10:01 AM ------      Message from: Wanda Plump      Created: Mon Oct 02, 2013  9:12 PM       Onalee Hua, call pt, let him know I released results to mychart with comments.      Also, call a prescription for Lipitor ( 6 months).            [place],      Your cholesterol has improved significantly with Lipitor, your liver tests remain normal, these are great results, continue with Lipitor on come back in 6 months for a physical.       Will call a refill on Lipitor ------

## 2014-03-01 ENCOUNTER — Telehealth: Payer: Self-pay

## 2014-03-01 NOTE — Telephone Encounter (Signed)
Medication List and allergies:  Reviewed and updated  90 day supply/mail order: na Local prescriptions: [facility] at [facility] and [place]  Immunizations due: UTD  A/P:   No changes to FH, PSH or Personal Hx Flu vaccine--09/2013 Tdap--08/2010 CCS--due  PSA--02/2013--0.69  To Discuss with Provider: Not at this time

## 2014-03-02 ENCOUNTER — Ambulatory Visit (INDEPENDENT_AMBULATORY_CARE_PROVIDER_SITE_OTHER): Payer: 59 | Admitting: Internal Medicine

## 2014-03-02 ENCOUNTER — Encounter: Payer: Self-pay | Admitting: Internal Medicine

## 2014-03-02 VITALS — BP 131/77 | HR 73 | Temp 98.0°F | Ht 71.5 in | Wt 322.0 lb

## 2014-03-02 DIAGNOSIS — Z0000 Encounter for general adult medical examination without abnormal findings: Secondary | ICD-10-CM

## 2014-03-02 DIAGNOSIS — E785 Hyperlipidemia, unspecified: Secondary | ICD-10-CM

## 2014-03-02 LAB — COMPREHENSIVE METABOLIC PANEL
ALBUMIN: 4.1 g/dL (ref 3.5–5.2)
ALK PHOS: 50 U/L (ref 39–117)
ALT: 39 U/L (ref 0–53)
AST: 22 U/L (ref 0–37)
BUN: 14 mg/dL (ref 6–23)
CALCIUM: 9.2 mg/dL (ref 8.4–10.5)
CHLORIDE: 101 meq/L (ref 96–112)
CO2: 28 mEq/L (ref 19–32)
Creatinine, Ser: 1 mg/dL (ref 0.4–1.5)
GFR: 86.82 mL/min (ref >60.00)
Glucose, Bld: 106 mg/dL — ABNORMAL HIGH (ref 70–99)
POTASSIUM: 4.2 meq/L (ref 3.5–5.1)
SODIUM: 136 meq/L (ref 135–145)
TOTAL PROTEIN: 7 g/dL (ref 6.0–8.3)
Total Bilirubin: 0.7 mg/dL (ref 0.3–1.2)

## 2014-03-02 LAB — CBC WITH DIFFERENTIAL/PLATELET
BASOS ABS: 0.1 10*3/uL (ref 0.0–0.1)
BASOS PCT: 1 % (ref 0.0–3.0)
EOS ABS: 0.1 10*3/uL (ref 0.0–0.7)
Eosinophils Relative: 2.2 % (ref 0.0–5.0)
HCT: 42.6 % (ref 39.0–52.0)
Hemoglobin: 13.9 g/dL (ref 13.0–17.0)
LYMPHS PCT: 29.1 % (ref 12.0–46.0)
Lymphs Abs: 1.9 10*3/uL (ref 0.7–4.0)
MCHC: 32.6 g/dL (ref 30.0–36.0)
MCV: 85.5 fl (ref 78.0–100.0)
MONO ABS: 0.5 10*3/uL (ref 0.1–1.0)
Monocytes Relative: 7.4 % (ref 3.0–12.0)
NEUTROS PCT: 60.3 % (ref 43.0–77.0)
Neutro Abs: 3.9 10*3/uL (ref 1.4–7.7)
Platelets: 284 10*3/uL (ref 150.0–400.0)
RBC: 4.99 Mil/uL (ref 4.22–5.81)
RDW: 13.7 % (ref 11.5–14.6)
WBC: 6.4 10*3/uL (ref 4.5–10.5)

## 2014-03-02 LAB — LIPID PANEL
CHOL/HDL RATIO: 3
CHOLESTEROL: 124 mg/dL (ref 0–200)
HDL: 42.1 mg/dL (ref >39.00)
LDL CALC: 60 mg/dL (ref 0–99)
Triglycerides: 110 mg/dL (ref 0.0–149.0)
VLDL: 22 mg/dL (ref 0.0–40.0)

## 2014-03-02 LAB — PSA: PSA: 0.54 ng/mL (ref 0.10–4.00)

## 2014-03-02 LAB — HEMOGLOBIN A1C: HEMOGLOBIN A1C: 6.1 % (ref 4.6–6.5)

## 2014-03-02 LAB — TSH: TSH: 1.61 u[IU]/mL (ref 0.35–5.50)

## 2014-03-02 MED ORDER — ATORVASTATIN CALCIUM 40 MG PO TABS: 40.0000 mg | ORAL_TABLET | Freq: Every day | ORAL | Status: DC

## 2014-03-02 NOTE — Assessment & Plan Note (Addendum)
Td 2011 Mat. Aunt had colon Ca.Marland KitchenMarland KitchenMarland KitchenColonoscopy: aprox  12/07/2002,  normal . Next at age 53 ---- refer to GI  Obesity, cont struggling w/ weight, reports comfort-stress eating Discussed diet-exercise, myfitnesspal ?  Will call if feels needs more help : [place]? Labs RTC 6 months

## 2014-03-02 NOTE — Progress Notes (Signed)
Pre visit review using our clinic review tool, if applicable. No additional management support is needed unless otherwise documented below in the visit note. 

## 2014-03-02 NOTE — Patient Instructions (Addendum)
Get your blood work before you leave    Next visit is for routine check up regards your cholesterol in 6 months  No need to come back fasting Please make an appointment    [email or web address]

## 2014-03-02 NOTE — Progress Notes (Signed)
Subjective:    Patient ID: Bryan [name], male    DOB: [birth year], 53 y.o.   MRN: [id number]  DOS:  03/02/2014 Type of  visit:  CPX   ROS Diet-- still has issues, stress-comfort  eating Exercise-- elliptical x 3/week No  CP, SOB No palpitations, no lower extremity edema Denies  nausea, vomiting diarrhea Denies  blood in the stools (-) cough, sputum production (-) wheezing, chest congestion No dysuria, gross hematuria, difficulty urinating   No anxiety, depression    Past Medical History  Diagnosis Date  . Hyperlipidemia     is being monitored every 6 month, no current med.  . Seasonal allergies   . Sleep apnea     uses CPAP nightly  . Dental crowns present   . Sebaceous cyst 04/2013    upper back    Past Surgical History  Procedure Laterality Date  . Cholecystectomy  01/12/2012    Procedure: LAPAROSCOPIC CHOLECYSTECTOMY WITH INTRAOPERATIVE CHOLANGIOGRAM;  Surgeon: Imogene Burn. [name], MD;  Location: [facility];  Service: General;  Laterality: N/A;  . Adenoidectomy      as a child  . Ear cyst excision N/A 05/04/2013    Procedure: Excision subcutaneous mass posterior neck;  Surgeon: Imogene Burn. [name], MD;  Location: [facility];  Service: General;  Laterality: N/A;  . Cyst excision      cyst removal from back    History   Social History  . Marital Status: Married    Spouse Name: N/A    Number of Children: 3  . Years of Education: N/A   Occupational History  .  manufactoring rep, [employer]     Social History Main Topics  . Smoking status: Never Smoker   . Smokeless tobacco: Never Used  . Alcohol Use: Yes     Comment: 1 glass wine every other day  . Drug Use: No  . Sexual Activity: Not on file   Other Topics Concern  . Not on file   Social History Narrative                  Family History  Problem Relation Age of Onset  . Cancer Mother     ovarian  . Cancer Brother     leukemia  . Colon cancer Maternal Aunt 2  . Cancer Paternal  Grandmother     breast  . Cancer Father 64    renal  . Hypertension Mother   . Prostate cancer    . Diabetes         Medication List       This list is accurate as of: 03/02/14  4:59 PM.  Always use your most recent med list.               aspirin 81 MG tablet  Take 81 mg by mouth daily.     atorvastatin 40 MG tablet  Commonly known as:  LIPITOR  Take 1 tablet (40 mg total) by mouth at bedtime.     CENTRUM PO  Take 1 tablet by mouth daily.     CO ENZYME Q-10 PO  Take by mouth daily.     fish oil-omega-3 fatty acids 1000 MG capsule  Take 1 g by mouth daily.     fluticasone 50 MCG/ACT nasal spray  Commonly known as:  FLONASE  Place 2 sprays into the nose daily.     zolpidem 10 MG tablet  Commonly known as:  AMBIEN  Take 1  tablet (10 mg total) by mouth at bedtime as needed for sleep.           Objective:   Physical Exam BP 131/77  Pulse 73  Temp(Src) 98 F (36.7 C)  Ht 5' 11.5" (1.816 m)  Wt 322 lb (146.058 kg)  BMI 44.29 kg/m2  SpO2 98% General -- alert, well-developed, NAD.  Neck --no thyromegaly   HEENT-- Not pale. TMs slt bulge on the L, no red Lungs -- normal respiratory effort, no intercostal retractions, no accessory muscle use, and normal breath sounds.  Heart-- normal rate, regular rhythm, no murmur.  Abdomen-- Not distended, good bowel sounds,soft, non-tender. Rectal-- No external abnormalities noted. Normal sphincter tone. No rectal masses or tenderness. No stool   Prostate--Prostate gland exam limited by pt's habitus but normal Extremities-- no pretibial edema bilaterally  Neurologic--  alert & oriented X3. Speech normal, gait normal, strength normal in all extremities.  Psych-- Cognition and judgment appear intact. Cooperative with normal attention span and concentration. No anxious or depressed appearing.      Assessment & Plan:   Mild L serous otitis-- cont flonase and occ OTC decongestants, if no better in 4-5 weeks call  ---?prednisone

## 2014-03-19 ENCOUNTER — Encounter: Payer: Self-pay | Admitting: Internal Medicine

## 2014-04-07 ENCOUNTER — Other Ambulatory Visit: Payer: Self-pay | Admitting: Internal Medicine

## 2014-04-09 ENCOUNTER — Telehealth: Payer: Self-pay | Admitting: *Deleted

## 2014-04-09 MED ORDER — ZOLPIDEM TARTRATE 10 MG PO TABS: 10.0000 mg | ORAL_TABLET | Freq: Every evening | ORAL | Status: DC | PRN

## 2014-04-09 NOTE — Telephone Encounter (Signed)
done

## 2014-04-09 NOTE — Telephone Encounter (Signed)
rx faxed- [facility] [address]  

## 2014-04-09 NOTE — Telephone Encounter (Signed)
ambien 10mg   Last OV- 03/02/14 Last refilled- 9/ 9/14 #30 / 2 rf  UDS- none

## 2014-04-18 ENCOUNTER — Ambulatory Visit (INDEPENDENT_AMBULATORY_CARE_PROVIDER_SITE_OTHER): Payer: 59 | Admitting: Internal Medicine

## 2014-04-18 ENCOUNTER — Encounter: Payer: Self-pay | Admitting: Internal Medicine

## 2014-04-18 VITALS — BP 136/83 | HR 72 | Temp 98.2°F | Wt 323.0 lb

## 2014-04-18 DIAGNOSIS — J209 Acute bronchitis, unspecified: Secondary | ICD-10-CM

## 2014-04-18 MED ORDER — HYDROCOD POLST-CHLORPHEN POLST 10-8 MG/5ML PO LQCR: 5.0000 mL | Freq: Every evening | ORAL | Status: DC | PRN

## 2014-04-18 MED ORDER — AZITHROMYCIN 250 MG PO TABS: ORAL_TABLET | ORAL | Status: DC

## 2014-04-18 NOTE — Progress Notes (Signed)
Subjective:    Patient ID: Bryan [name], male    DOB: 1961-09-07, 53 y.o.   MRN: [id number]  DOS:  04/18/2014 Type of  visit: Acute visit Symptoms started  4 days ago with chest congestion, small amount of thick sputum, clear in color. Taking Mucinex without much help Cough is severe at night, having difficulty using his CPAP, Tussionex?.   ROS With the onset of symptoms had fever, chills and myalgias -- all resolved after the first 24 hours. No  nausea, vomiting, diarrhea. Minimal to no nasal congestion or sinus pain  Past Medical History  Diagnosis Date  . Hyperlipidemia     is being monitored every 6 month, no current med.  . Seasonal allergies   . Sleep apnea     uses CPAP nightly  . Dental crowns present   . Sebaceous cyst 04/2013    upper back    Past Surgical History  Procedure Laterality Date  . Cholecystectomy  01/12/2012    Procedure: LAPAROSCOPIC CHOLECYSTECTOMY WITH INTRAOPERATIVE CHOLANGIOGRAM;  Surgeon: Imogene Burn. [name], MD;  Location: [facility];  Service: General;  Laterality: N/A;  . Adenoidectomy      as a child  . Ear cyst excision N/A 05/04/2013    Procedure: Excision subcutaneous mass posterior neck;  Surgeon: Imogene Burn. [name], MD;  Location: [place];  Service: General;  Laterality: N/A;  . Cyst excision      cyst removal from back    History   Social History  . Marital Status: Married    Spouse Name: N/A    Number of Children: 3  . Years of Education: N/A   Occupational History  .  manufactoring rep, [employer]     Social History Main Topics  . Smoking status: Never Smoker   . Smokeless tobacco: Never Used  . Alcohol Use: Yes     Comment: 1 glass wine every other day  . Drug Use: No  . Sexual Activity: Not on file   Other Topics Concern  . Not on file   Social History Narrative                     Medication List       This list is accurate as of: 04/18/14  8:24 PM.  Always use your most recent med list.               aspirin 81 MG tablet  Take 81 mg by mouth daily.     atorvastatin 40 MG tablet  Commonly known as:  LIPITOR  Take 1 tablet (40 mg total) by mouth at bedtime.     azithromycin 250 MG tablet  Commonly known as:  ZITHROMAX Z-PAK  As directed     CENTRUM PO  Take 1 tablet by mouth daily.     chlorpheniramine-HYDROcodone 10-8 MG/5ML Lqcr  Commonly known as:  TUSSIONEX  Take 5 mLs by mouth at bedtime as needed for cough.     CO ENZYME Q-10 PO  Take by mouth daily.     fish oil-omega-3 fatty acids 1000 MG capsule  Take 1 g by mouth daily.     fluticasone 50 MCG/ACT nasal spray  Commonly known as:  FLONASE  Place 2 sprays into the nose daily.     zolpidem 10 MG tablet  Commonly known as:  AMBIEN  Take 1 tablet (10 mg total) by mouth at bedtime as needed for sleep.  Objective:   Physical Exam BP 136/83  Pulse 72  Temp(Src) 98.2 F (36.8 C)  Wt 323 lb (146.512 kg)  SpO2 95% General -- alert, well-developed, NAD.  HEENT-- Not pale. TMs normal, throat symmetric, no redness or discharge. Face symmetric, sinuses not tender to palpation. Nose slt congested.  Lungs -- normal respiratory effort, no intercostal retractions, no accessory muscle use, and normal breath sounds.  Heart-- normal rate, regular rhythm, no murmur.  Neurologic--  alert & oriented X3.  Psych-- Cognition and judgment appear intact. Cooperative with normal attention span and concentration. No anxious or depressed appearing.        Assessment & Plan:    Bronchitis, Mild bronchitis, conservative treatment, if no better --- zpack Ok tussionex for nighttime only See instructions

## 2014-04-18 NOTE — Patient Instructions (Signed)
Rest, fluids , tylenol  For cough, take Mucinex DM twice a day as needed  If cough severe take tussionex at night  For congestion use OTC Nasocort: 2 nasal sprays on each side of the nose daily until you feel better   Take the antibiotic as prescribed  (Zithromax) if no better in 3-4 days   Call if no better in few days Call anytime if the symptoms are severe

## 2014-04-18 NOTE — Progress Notes (Signed)
Pre visit review using our clinic review tool, if applicable. No additional management support is needed unless otherwise documented below in the visit note. 

## 2014-04-24 ENCOUNTER — Ambulatory Visit (AMBULATORY_SURGERY_CENTER): Payer: Self-pay

## 2014-04-24 VITALS — Ht 71.0 in | Wt 322.8 lb

## 2014-04-24 DIAGNOSIS — Z800 Family history of malignant neoplasm of digestive organs: Secondary | ICD-10-CM

## 2014-04-24 MED ORDER — MOVIPREP 100 G PO SOLR: ORAL | Status: DC

## 2014-04-24 NOTE — Progress Notes (Signed)
Pt came into the office today for his pre-visit prior to his colonoscopy on 05/08/14 with Dr Henrene Pastor.He states he had a colonoscopy done over 10 years ago at [facility].He does not remember the doctors name.A medical release form was filled out and given to [facility].       Per pt, no allergies to soy or egg products.Pt not taking any weight loss meds or using  O2 at home.

## 2014-04-27 ENCOUNTER — Encounter: Payer: Self-pay | Admitting: Internal Medicine

## 2014-05-08 ENCOUNTER — Ambulatory Visit (AMBULATORY_SURGERY_CENTER): Payer: 59 | Admitting: Internal Medicine

## 2014-05-08 ENCOUNTER — Encounter: Payer: Self-pay | Admitting: Internal Medicine

## 2014-05-08 VITALS — BP 107/57 | HR 65 | Temp 97.7°F | Resp 20 | Ht 71.0 in | Wt 322.0 lb

## 2014-05-08 DIAGNOSIS — D126 Benign neoplasm of colon, unspecified: Secondary | ICD-10-CM

## 2014-05-08 DIAGNOSIS — Z1211 Encounter for screening for malignant neoplasm of colon: Secondary | ICD-10-CM

## 2014-05-08 DIAGNOSIS — Z800 Family history of malignant neoplasm of digestive organs: Secondary | ICD-10-CM

## 2014-05-08 MED ORDER — SODIUM CHLORIDE 0.9 % IV SOLN: 500.0000 mL | INTRAVENOUS | Status: DC

## 2014-05-08 NOTE — Op Note (Signed)
[facility]  [address]. [place], [address]   COLONOSCOPY PROCEDURE REPORT  PATIENT: Bryan [name], Bryan [name]  MR#: [id number] BIRTHDATE: 04/14/1961 , 52  yrs. old GENDER: Male ENDOSCOPIST: Eustace Quail, MD REFERRED HQ:[facility] Lar[facility] Kells, M.D. PROCEDURE DATE:  05/08/2014 PROCEDURE:   Colonoscopy with snare polypectomy x 3 First Screening Colonoscopy - Avg.  risk and is 50 yrs.  old or older - No.  Prior Negative Screening - Now for repeat screening. 10 or more years since last screening  History of Adenoma - Now for follow-up colonoscopy & has been > or = to 3 yrs.  N/A  Polyps Removed Today? Yes. ASA CLASS:   Class II INDICATIONS:average risk screening. Aunt had CRC. reports negative exam 10 yrs ago elsewhere. MEDICATIONS: MAC sedation, administered by CRNA and propofol (Diprivan) 390mg  IV  DESCRIPTION OF PROCEDURE:   After the risks benefits and alternatives of the procedure were thoroughly explained, informed consent was obtained.  A digital rectal exam revealed no abnormalities of the rectum.   The LB [id number]  endoscope was introduced through the anus and advanced to the cecum, which was identified by both the appendix and ileocecal valve. No adverse events experienced.   The quality of the prep was excellent, using MoviPrep  The instrument was then slowly withdrawn as the colon was fully examined.  COLON FINDINGS: Three diminutive polyps were found in the ascending colon and sigmoid colon.  A polypectomy was performed with a cold snare.  The resection was complete and the polyp tissue was completely retrieved.   Moderate diverticulosis was noted in the sigmoid colon.   The colon mucosa was otherwise normal. Retroflexed views revealed internal hemorrhoids. The time to cecum=1 minutes 53 seconds.  Withdrawal time=13 minutes 13 seconds. The scope was withdrawn and the procedure completed. COMPLICATIONS: There were no complications.  ENDOSCOPIC  IMPRESSION: 1.   Three diminutive polyps were found in  colon; polypectomy was performed with a cold snare 2.   Moderate diverticulosis was noted in the sigmoid colon 3.   The colon mucosa was otherwise normal  RECOMMENDATIONS: 1. Follow up colonoscopy in 5 years   eSigned:  Eustace Quail, MD 05/08/2014 8:33 AM   cc: Kathlene November, MD and The Patient

## 2014-05-08 NOTE — Progress Notes (Signed)
Pt transferred to PACU report given to RN drowsy but alert and oriented

## 2014-05-08 NOTE — Patient Instructions (Signed)
YOU HAD AN ENDOSCOPIC PROCEDURE TODAY AT THE [facility]: Refer to the procedure report that was given to you for any specific questions about what was found during the examination.  If the procedure report does not answer your questions, please call your gastroenterologist to clarify.  If you requested that your care partner not be given the details of your procedure findings, then the procedure report has been included in a sealed envelope for you to review at your convenience later.  YOU SHOULD EXPECT: Some feelings of bloating in the abdomen. Passage of more gas than usual.  Walking can help get rid of the air that was put into your GI tract during the procedure and reduce the bloating. If you had a lower endoscopy (such as a colonoscopy or flexible sigmoidoscopy) you may notice spotting of blood in your stool or on the toilet paper. If you underwent a bowel prep for your procedure, then you may not have a normal bowel movement for a few days.  DIET: Your first meal following the procedure should be a light meal and then it is ok to progress to your normal diet.  A half-sandwich or bowl of soup is an example of a good first meal.  Heavy or fried foods are harder to digest and may make you feel nauseous or bloated.  Likewise meals heavy in dairy and vegetables can cause extra gas to form and this can also increase the bloating.  Drink plenty of fluids but you should avoid alcoholic beverages for 24 hours.  ACTIVITY: Your care partner should take you home directly after the procedure.  You should plan to take it easy, moving slowly for the rest of the day.  You can resume normal activity the day after the procedure however you should NOT DRIVE or use heavy machinery for 24 hours (because of the sedation medicines used during the test).    SYMPTOMS TO REPORT IMMEDIATELY: A gastroenterologist can be reached at any hour.  During normal business hours, 8:30 AM to 5:00 PM Monday through Friday,  call [phone].  After hours and on weekends, please call the GI answering service at [phone] who will take a message and have the physician on call contact you.   Following lower endoscopy (colonoscopy or flexible sigmoidoscopy):  Excessive amounts of blood in the stool  Significant tenderness or worsening of abdominal pains  Swelling of the abdomen that is new, acute  Fever of 100F or higher    FOLLOW UP: If any biopsies were taken you will be contacted by phone or by letter within the next 1-3 weeks.  Call your gastroenterologist if you have not heard about the biopsies in 3 weeks.  Our staff will call the home number listed on your records the next business day following your procedure to check on you and address any questions or concerns that you may have at that time regarding the information given to you following your procedure. This is a courtesy call and so if there is no answer at the home number and we have not heard from you through the emergency physician on call, we will assume that you have returned to your regular daily activities without incident.  SIGNATURES/CONFIDENTIALITY: You and/or your care partner have signed paperwork which will be entered into your electronic medical record.  These signatures attest to the fact that that the information above on your After Visit Summary has been reviewed and is understood.  Full responsibility of the confidentiality   of this discharge information lies with you and/or your care-partner.  Polyp,diverticulosis, and high fiber diet information given.  Next colonoscopy 5 years-2020. 

## 2014-05-08 NOTE — Progress Notes (Signed)
Called to room to assist during endoscopic procedure.  Patient ID and intended procedure confirmed with present staff. Received instructions for my participation in the procedure from the performing physician.  

## 2014-05-09 ENCOUNTER — Telehealth: Payer: Self-pay | Admitting: *Deleted

## 2014-05-09 NOTE — Telephone Encounter (Signed)
Name identifier, left message, follow-up 

## 2014-05-15 ENCOUNTER — Encounter: Payer: Self-pay | Admitting: Internal Medicine

## 2014-09-04 ENCOUNTER — Encounter: Payer: Self-pay | Admitting: Internal Medicine

## 2014-09-04 ENCOUNTER — Ambulatory Visit (INDEPENDENT_AMBULATORY_CARE_PROVIDER_SITE_OTHER): Payer: 59 | Admitting: Internal Medicine

## 2014-09-04 VITALS — BP 132/68 | HR 66 | Temp 98.2°F | Wt 326.1 lb

## 2014-09-04 DIAGNOSIS — M7710 Lateral epicondylitis, unspecified elbow: Secondary | ICD-10-CM

## 2014-09-04 DIAGNOSIS — R739 Hyperglycemia, unspecified: Secondary | ICD-10-CM | POA: Insufficient documentation

## 2014-09-04 DIAGNOSIS — R7309 Other abnormal glucose: Secondary | ICD-10-CM

## 2014-09-04 DIAGNOSIS — R7303 Prediabetes: Secondary | ICD-10-CM

## 2014-09-04 HISTORY — DX: Prediabetes: R73.03

## 2014-09-04 LAB — HEMOGLOBIN A1C: HEMOGLOBIN A1C: 6 % (ref 4.6–6.5)

## 2014-09-04 NOTE — Progress Notes (Signed)
Subjective:    Patient ID: Bryan [name], male    DOB: [birth year], 53 y.o.   MRN: [id number]  DOS:  09/04/2014 Type of visit - description : F/U Interval history: In general feeling well but has been unable to lose weight despite exercising almost daily  And eating a low-calorie diet approximately 1800-2000 qd . High cholesterol, good medication compliance Pre-diabetes, concept of prediabetes  was discussed. Continue with right elbow pain @ medial epicondyle , patient thinks is tendinitis, symptoms increased when he grips with his hands     Past Medical History  Diagnosis Date  . Hyperlipidemia   . Seasonal allergies   . Sleep apnea     uses CPAP nightly  . Dental crowns present   . Sebaceous cyst 04/2013    upper back  . Prediabetes 09/04/2014    Past Surgical History  Procedure Laterality Date  . Cholecystectomy  01/12/2012    Procedure: LAPAROSCOPIC CHOLECYSTECTOMY WITH INTRAOPERATIVE CHOLANGIOGRAM;  Surgeon: Imogene Burn. [name], MD;  Location: [facility];  Service: General;  Laterality: N/A;  . Adenoidectomy      as a child  . Ear cyst excision N/A 05/04/2013    Procedure: Excision subcutaneous mass posterior neck;  Surgeon: Imogene Burn. [name], MD;  Location: [place];  Service: General;  Laterality: N/A;  . Cyst excision      cyst removal from back    History   Social History  . Marital Status: Married    Spouse Name: N/A    Number of Children: 3  . Years of Education: N/A   Occupational History  .  manufactoring rep, [employer]     Social History Main Topics  . Smoking status: Never Smoker   . Smokeless tobacco: Never Used  . Alcohol Use: Yes     Comment: 1 glass wine every other day  . Drug Use: No  . Sexual Activity: Not on file   Other Topics Concern  . Not on file   Social History Narrative                     Medication List       This list is accurate as of: 09/04/14  5:59 PM.  Always use your most recent med list.             aspirin 81 MG tablet  Take 81 mg by mouth daily.     atorvastatin 40 MG tablet  Commonly known as:  LIPITOR  Take 1 tablet (40 mg total) by mouth at bedtime.     CENTRUM PO  Take 1 tablet by mouth daily.     CO ENZYME Q-10 PO  Take by mouth daily.     fish oil-omega-3 fatty acids 1000 MG capsule  Take 1 g by mouth daily.     fluticasone 50 MCG/ACT nasal spray  Commonly known as:  FLONASE  Place 2 sprays into the nose daily.     psyllium 58.6 % packet  Commonly known as:  METAMUCIL  Take 1 packet by mouth daily.     zolpidem 10 MG tablet  Commonly known as:  AMBIEN  Take 1 tablet (10 mg total) by mouth at bedtime as needed for sleep.           Objective:   Physical Exam BP 132/68  Pulse 66  Temp(Src) 98.2 F (36.8 C) (Oral)  Wt 326 lb 2 oz (147.929 kg)  SpO2 98% General -- alert,  well-developed, NAD.  Lungs -- normal respiratory effort, no intercostal retractions, no accessory muscle use, and normal breath sounds.  Heart-- normal rate, regular rhythm, no murmur.   Extremities-- no pretibial edema bilaterally ; Elbows symmetric and normal to inspection on palpation Neurologic--  alert & oriented X3. Speech normal, gait appropriate for age, strength symmetric and appropriate for age.     Psych-- Cognition and judgment appear intact. Cooperative with normal attention span and concentration. No anxious or depressed appearing.        Assessment & Plan:  Tennis elbow, We talk about possibly a sports medicine referral for a local injection, pain is not severe, he elected conservative treatment for now, will call when ready  Had a flu shot yesterday

## 2014-09-04 NOTE — Patient Instructions (Signed)
Get your blood work before you leave    Please come back to the office by 02-2015 for a physical exam. Come back fasting      MYFITNESSPAL

## 2014-09-04 NOTE — Progress Notes (Signed)
Pre visit review using our clinic review tool, if applicable. No additional management support is needed unless otherwise documented below in the visit note. 

## 2014-09-04 NOTE — Assessment & Plan Note (Signed)
Extensive discussion today about diet and exercise. Goal is to lose 1 pound month, tips provided, ? myfitnesspal

## 2014-09-19 ENCOUNTER — Ambulatory Visit: Payer: 59 | Admitting: Pulmonary Disease

## 2014-09-19 ENCOUNTER — Encounter: Payer: Self-pay | Admitting: Pulmonary Disease

## 2014-09-19 ENCOUNTER — Ambulatory Visit (INDEPENDENT_AMBULATORY_CARE_PROVIDER_SITE_OTHER): Payer: 59 | Admitting: Pulmonary Disease

## 2014-09-19 VITALS — BP 144/78 | HR 82 | Temp 97.3°F | Ht 71.0 in | Wt 328.2 lb

## 2014-09-19 DIAGNOSIS — G4733 Obstructive sleep apnea (adult) (pediatric): Secondary | ICD-10-CM

## 2014-09-19 NOTE — Patient Instructions (Signed)
Continue on cpap, and keep up with mask changes and supplies. Work on weight loss followup with me again in one year.  

## 2014-09-19 NOTE — Progress Notes (Signed)
   Subjective:    Patient ID: Bryan [name], male    DOB: [birth year], 53 y.o.   MRN: [id number]  HPI The patient comes in today for followup of his obstructive sleep apnea. He is wearing CPAP compliantly, and his download shows excellent control of his AHI.  He is due for new pillows on his mask, and has contacted his home care company about this. He feels that he is sleeping well with his device, and has no daytime sleepiness.   Review of Systems  Constitutional: Negative for fever and unexpected weight change.  HENT: Negative for congestion, dental problem, ear pain, nosebleeds, postnasal drip, rhinorrhea, sinus pressure, sneezing, sore throat and trouble swallowing.   Eyes: Negative for redness and itching.  Respiratory: Negative for cough, chest tightness, shortness of breath and wheezing.   Cardiovascular: Negative for palpitations and leg swelling.  Gastrointestinal: Negative for nausea and vomiting.  Genitourinary: Negative for dysuria.  Musculoskeletal: Negative for joint swelling.  Skin: Negative for rash.  Neurological: Negative for headaches.  Hematological: Does not bruise/bleed easily.  Psychiatric/Behavioral: Negative for dysphoric mood. The patient is not nervous/anxious.        Objective:   Physical Exam Overweight male in no acute distress Nose without purulence or discharge noted Neck without lymphadenopathy or thyromegaly No skin breakdown or pressure necrosis from the CPAP Lower extremities without edema, no cyanosis Alert and oriented, moves all 4 extremities.       Assessment & Plan:

## 2014-09-19 NOTE — Assessment & Plan Note (Signed)
The patient appears to be doing well from a sleep apnea standpoint. His download shows excellent control of his OSA, and he is having no significant mask leak. He feels that he is sleeping well, and has no significant daytime alertness issues. I have asked him to keep up with his mask changes and supplies, and to work aggressively on weight loss.

## 2015-04-07 ENCOUNTER — Other Ambulatory Visit: Payer: Self-pay | Admitting: Internal Medicine

## 2015-07-22 ENCOUNTER — Other Ambulatory Visit: Payer: Self-pay | Admitting: Internal Medicine

## 2015-08-29 ENCOUNTER — Other Ambulatory Visit: Payer: Self-pay | Admitting: Internal Medicine

## 2015-09-07 ENCOUNTER — Other Ambulatory Visit: Payer: Self-pay | Admitting: Internal Medicine

## 2015-09-20 ENCOUNTER — Ambulatory Visit: Payer: 59 | Admitting: Pulmonary Disease

## 2015-09-30 ENCOUNTER — Encounter: Payer: Self-pay | Admitting: Pulmonary Disease

## 2015-09-30 ENCOUNTER — Ambulatory Visit (INDEPENDENT_AMBULATORY_CARE_PROVIDER_SITE_OTHER): Payer: 59 | Admitting: Pulmonary Disease

## 2015-09-30 VITALS — BP 132/78 | HR 72 | Temp 98.2°F | Ht 71.0 in | Wt 333.4 lb

## 2015-09-30 DIAGNOSIS — Z6841 Body Mass Index (BMI) 40.0 and over, adult: Secondary | ICD-10-CM

## 2015-09-30 DIAGNOSIS — G4733 Obstructive sleep apnea (adult) (pediatric): Secondary | ICD-10-CM

## 2015-09-30 DIAGNOSIS — Z23 Encounter for immunization: Secondary | ICD-10-CM | POA: Diagnosis not present

## 2015-09-30 DIAGNOSIS — Z9989 Dependence on other enabling machines and devices: Principal | ICD-10-CM

## 2015-09-30 NOTE — Patient Instructions (Signed)
Follow up in 1 year.

## 2015-09-30 NOTE — Progress Notes (Signed)
Chief Complaint  Patient presents with  . Follow-up    Former [place] pt following for OSA: pt states he is doing well. Pt using CPAP evernight for about 6 hours. nose pillows and pressure good for pt. no concerns. DME: [facility]     History of Present Illness: Bryan [name] is a 54 y.o. male with OSA.  He was previously followed by Dr. Gwenette Greet.  He uses CPAP every night.  He has nasal pillows mask >> no issue with mask fit.  He gets about 6.5 to 7 hrs sleep per night.  He was having trouble with his weight this last year.  He is a "stress" eater, and was dealing with stress from his wife's dx of breast cancer.  He is going to resume a diet to help get his weight down.  TESTS: CPAP 08/28/15 to 09/26/15 >> used on 30 of 30 nights with average 6 hrs and 36 min.  Average AHI is 1.1 with CPAP 10 cm H2O.  PMhx >> HLD  Past surgical hx, Medications, Allergies, Family hx, Social hx all reviewed.   Physical Exam: BP 132/78 mmHg  Pulse 72  Temp(Src) 98.2 F (36.8 C) (Oral)  Ht 5\' 11"  (1.803 m)  Wt 333 lb 6.4 oz (151.229 kg)  BMI 46.52 kg/m2  SpO2 97%  General - No distress ENT - No sinus tenderness, no oral exudate, no LAN Cardiac - s1s2 regular, no murmur Chest - No wheeze/rales/dullness Back - No focal tenderness Abd - Soft, non-tender Ext - No edema Neuro - Normal strength Skin - No rashes Psych - normal mood, and behavior   Assessment/Plan:  Obstructive sleep apnea. He is compliant with CPAP and reports benefit from therapy. Plan: - continue CPAP 10 cm H2O  Obesity. Plan: - discussed techniques to assist with weight loss   Chesley Mires, MD [facility] Pager:  [phone]

## 2015-12-08 DIAGNOSIS — N200 Calculus of kidney: Secondary | ICD-10-CM

## 2015-12-08 HISTORY — DX: Calculus of kidney: N20.0

## 2016-02-25 ENCOUNTER — Other Ambulatory Visit: Payer: Self-pay | Admitting: Internal Medicine

## 2016-05-22 ENCOUNTER — Ambulatory Visit (INDEPENDENT_AMBULATORY_CARE_PROVIDER_SITE_OTHER): Payer: 59 | Admitting: Internal Medicine

## 2016-05-22 ENCOUNTER — Encounter: Payer: Self-pay | Admitting: Internal Medicine

## 2016-05-22 VITALS — BP 120/80 | HR 70 | Temp 97.7°F | Ht 71.0 in | Wt 335.2 lb

## 2016-05-22 DIAGNOSIS — Z0000 Encounter for general adult medical examination without abnormal findings: Secondary | ICD-10-CM | POA: Diagnosis not present

## 2016-05-22 DIAGNOSIS — Z114 Encounter for screening for human immunodeficiency virus [HIV]: Secondary | ICD-10-CM

## 2016-05-22 LAB — LIPID PANEL
CHOL/HDL RATIO: 4
CHOLESTEROL: 168 mg/dL (ref 0–200)
HDL: 40.6 mg/dL (ref >39.00)
LDL CALC: 98 mg/dL (ref 0–99)
NONHDL: 127.63
Triglycerides: 148 mg/dL (ref 0.0–149.0)
VLDL: 29.6 mg/dL (ref 0.0–40.0)

## 2016-05-22 LAB — CBC WITH DIFFERENTIAL/PLATELET
BASOS PCT: 0.5 % (ref 0.0–3.0)
Basophils Absolute: 0 10*3/uL (ref 0.0–0.1)
Eosinophils Absolute: 0.2 10*3/uL (ref 0.0–0.7)
Eosinophils Relative: 2.2 % (ref 0.0–5.0)
HCT: 43.5 % (ref 39.0–52.0)
HEMOGLOBIN: 14.6 g/dL (ref 13.0–17.0)
LYMPHS PCT: 25.5 % (ref 12.0–46.0)
Lymphs Abs: 2.4 10*3/uL (ref 0.7–4.0)
MCHC: 33.5 g/dL (ref 30.0–36.0)
MCV: 84.2 fl (ref 78.0–100.0)
MONO ABS: 0.6 10*3/uL (ref 0.1–1.0)
Monocytes Relative: 6.1 % (ref 3.0–12.0)
NEUTROS PCT: 65.7 % (ref 43.0–77.0)
Neutro Abs: 6.3 10*3/uL (ref 1.4–7.7)
PLATELETS: 292 10*3/uL (ref 150.0–400.0)
RBC: 5.17 Mil/uL (ref 4.22–5.81)
RDW: 13.6 % (ref 11.5–15.5)
WBC: 9.6 10*3/uL (ref 4.0–10.5)

## 2016-05-22 LAB — COMPREHENSIVE METABOLIC PANEL
ALT: 29 U/L (ref 0–53)
AST: 20 U/L (ref 0–37)
Albumin: 4.1 g/dL (ref 3.5–5.2)
Alkaline Phosphatase: 43 U/L (ref 39–117)
BILIRUBIN TOTAL: 0.6 mg/dL (ref 0.2–1.2)
BUN: 14 mg/dL (ref 6–23)
CALCIUM: 9.2 mg/dL (ref 8.4–10.5)
CHLORIDE: 105 meq/L (ref 96–112)
CO2: 27 mEq/L (ref 19–32)
CREATININE: 0.8 mg/dL (ref 0.40–1.50)
GFR: 107.5 mL/min (ref >60.00)
GLUCOSE: 84 mg/dL (ref 70–99)
Potassium: 3.9 mEq/L (ref 3.5–5.1)
SODIUM: 139 meq/L (ref 135–145)
Total Protein: 7.4 g/dL (ref 6.0–8.3)

## 2016-05-22 NOTE — Progress Notes (Signed)
Pre visit review using our clinic review tool, if applicable. No additional management support is needed unless otherwise documented below in the visit note. 

## 2016-05-22 NOTE — Assessment & Plan Note (Addendum)
Td 2011  [facility] : +FH Mat. Aunt had colon Ca Colonoscopy: aprox  12/07/2002,  normal . cscope 2015- polyps, next 5 years   Prostate ca screening : 2015 DRE-PSA wnl, reassess next year  Extensive discussion about diet, exercise. Patient will think about medications, see instructions. Bariatric surgery is an option . Labs: HIV, CMP, FLP, CBC, A1c

## 2016-05-22 NOTE — Progress Notes (Signed)
Subjective:    Patient ID: Bryan [name], male    DOB: 1961-10-27, 55 y.o.   MRN: [id number]  DOS:  05/22/2016 Type of visit - description : CPX Interval history: No major concerns except that has been unable to lose weight. Also, wife had some medical challenges but she is a stable at this point.    Review of Systems  Constitutional: No fever. No chills. No unexplained wt changes. No unusual sweats  HEENT: No dental problems, no ear discharge, no facial swelling, no voice changes. No eye discharge, no eye  redness , no  intolerance to light   Respiratory: No wheezing , no  difficulty breathing. No cough , no mucus production  Cardiovascular: No CP, no leg swelling , no  Palpitations  GI: no nausea, no vomiting, no diarrhea , no  abdominal pain.  No blood in the stools. No dysphagia, no odynophagia    Endocrine: No polyphagia, no polyuria , no polydipsia  GU: No dysuria, gross hematuria, difficulty urinating. No urinary urgency, no frequency.  Musculoskeletal: No joint swellings or unusual aches or pains  Skin: No change in the color of the skin, palor , no  Rash  Allergic, immunologic: No environmental allergies , no  food allergies  Neurological: No dizziness no  syncope. No headaches. No diplopia, no slurred, no slurred speech, no motor deficits, no facial  Numbness  Hematological: No enlarged lymph nodes, no easy bruising , no unusual bleedings  Psychiatry: No suicidal ideas, no hallucinations, no beavior problems, no confusion.  No unusual/severe anxiety, no depression  Past Medical History  Diagnosis Date  . Hyperlipidemia   . Seasonal allergies   . Sleep apnea     uses CPAP nightly  . Dental crowns present   . Sebaceous cyst 04/2013    upper back  . Prediabetes 09/04/2014    Past Surgical History  Procedure Laterality Date  . Cholecystectomy  01/12/2012    Procedure: LAPAROSCOPIC CHOLECYSTECTOMY WITH INTRAOPERATIVE CHOLANGIOGRAM;  Surgeon: Imogene Burn.  [name], MD;  Location: [facility];  Service: General;  Laterality: N/A;  . Adenoidectomy      as a child  . Ear cyst excision N/A 05/04/2013    Procedure: Excision subcutaneous mass posterior neck;  Surgeon: Imogene Burn. [name], MD;  Location: [facility];  Service: General;  Laterality: N/A;  . Cyst excision      cyst removal from back    Social History   Social History  . Marital Status: Married    Spouse Name: N/A  . Number of Children: 3  . Years of Education: N/A   Occupational History  .  manufactoring rep, [employer] units     Social History Main Topics  . Smoking status: Never Smoker   . Smokeless tobacco: Never Used  . Alcohol Use: Yes     Comment: 1 glass wine every other day  . Drug Use: No  . Sexual Activity: Not on file   Other Topics Concern  . 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 Cancer Mother     ovarian  . Hypertension Mother   . Cancer Brother     leukemia  .  Colon cancer Maternal Aunt 60  . Cancer Paternal Grandmother     breast  . Cancer Father 7    renal  . Prostate cancer Neg Hx   . Diabetes Neg Hx   . Esophageal cancer Neg Hx   . Rectal cancer Neg Hx   . Stomach cancer Neg Hx   . CAD Neg Hx        Medication List       This list is accurate as of: 05/22/16 11:59 PM.  Always use your most recent med list.               aspirin 81 MG tablet  Take 81 mg by mouth daily.     CENTRUM PO  Take 1 tablet by mouth daily.     CO ENZYME Q-10 PO  Take by mouth daily.     fish oil-omega-3 fatty acids 1000 MG capsule  Take 1 g by mouth daily.     Krill Oil 500 MG Caps  Take 500 mg by mouth daily.     Melatonin 1 MG Caps  Take 1 mg by mouth daily.     psyllium 58.6 % packet  Commonly known as:  METAMUCIL  Take 1 packet by  mouth daily. Reported on 05/22/2016     Turmeric 500 MG Caps  Take 500 mg by mouth daily.     Valerian Root 450 MG Caps  Take 450 mg by mouth daily.           Objective:   Physical Exam BP 120/80 mmHg  Pulse 70  Temp(Src) 97.7 F (36.5 C) (Oral)  Ht 5\' 11"  (1.803 m)  Wt 335 lb 4 oz (152.068 kg)  BMI 46.78 kg/m2  SpO2 97%  General:   Well developed, well nourished . NAD.  Neck: No  thyromegaly  HEENT:  Normocephalic . Face symmetric, atraumatic Lungs:  CTA B Normal respiratory effort, no intercostal retractions, no accessory muscle use. Heart: RRR,  no murmur.  Trace pretibial edema bilaterally  Abdomen:  Not distended, soft, non-tender. No rebound or rigidity.   Skin: Exposed areas without rash. Not pale. Not jaundice Neurologic:  alert & oriented X3.  Speech normal, gait appropriate for age and unassisted Strength symmetric and appropriate for age.  Psych: Cognition and judgment appear intact.  Cooperative with normal attention span and concentration.  Behavior appropriate. No anxious or depressed appearing.    Assessment & Plan:   Assessment Prediabetes Hyperlipidemia Insomnia -- on OTCs Morbid obesity, BMI 46 OSA, CPAP Seasonal allergies  Plan: Prediabetes: Check a FLP Hyperlipidemia: Used to be on Lipitor 40 mg, ran out a few months ago, recheck an FLP, consider restart medications  RTC 6 months

## 2016-05-22 NOTE — Patient Instructions (Signed)
GO TO THE LAB : Get the blood work     GO TO THE FRONT DESK Schedule your next appointment for a  routine checkup in 6 months  FDA medications approved for weight loss: CONTRAVE QSYMIA [place]

## 2016-05-23 DIAGNOSIS — Z09 Encounter for follow-up examination after completed treatment for conditions other than malignant neoplasm: Secondary | ICD-10-CM | POA: Insufficient documentation

## 2016-05-23 NOTE — Assessment & Plan Note (Signed)
Prediabetes: Check a FLP Hyperlipidemia: Used to be on Lipitor 40 mg, ran out a few months ago, recheck an FLP, consider restart medications  RTC 6 months

## 2016-05-25 LAB — HIV ANTIBODY (ROUTINE TESTING W REFLEX): HIV: NONREACTIVE

## 2016-05-27 MED ORDER — ATORVASTATIN CALCIUM 40 MG PO TABS: 40.0000 mg | ORAL_TABLET | Freq: Every day | ORAL | Status: DC

## 2016-05-27 NOTE — Addendum Note (Signed)
Addended byDamita Dunnings D on: 05/27/2016 05:11 PM   Modules accepted: Orders, SmartSet

## 2016-08-31 ENCOUNTER — Encounter: Payer: Self-pay | Admitting: Medical

## 2016-08-31 ENCOUNTER — Ambulatory Visit (INDEPENDENT_AMBULATORY_CARE_PROVIDER_SITE_OTHER): Payer: 59 | Admitting: Medical

## 2016-08-31 VITALS — BP 141/77 | HR 77 | Temp 98.6°F | Ht 71.0 in | Wt 334.6 lb

## 2016-08-31 DIAGNOSIS — J209 Acute bronchitis, unspecified: Secondary | ICD-10-CM

## 2016-08-31 MED ORDER — ALBUTEROL SULFATE HFA 108 (90 BASE) MCG/ACT IN AERS: 2.0000 | INHALATION_SPRAY | Freq: Four times a day (QID) | RESPIRATORY_TRACT | 0 refills | Status: DC | PRN

## 2016-08-31 MED ORDER — HYDROCOD POLST-CPM POLST ER 10-8 MG/5ML PO SUER: 5.0000 mL | Freq: Two times a day (BID) | ORAL | 0 refills | Status: DC | PRN

## 2016-08-31 MED ORDER — DOXYCYCLINE HYCLATE 100 MG PO TABS: 100.0000 mg | ORAL_TABLET | Freq: Two times a day (BID) | ORAL | 0 refills | Status: DC

## 2016-08-31 NOTE — Progress Notes (Signed)
Pre visit review using our clinic tool,if applicable. No additional management support is needed unless otherwise documented below in the visit note.  

## 2016-08-31 NOTE — Progress Notes (Signed)
Subjective:    Patient ID: Bryan [name], male    DOB: [birth year], 55 y.o.   MRN: [id number]  HPI   Pt in feeling sick for about 7-10 days.   Feels chest congestion. Pt has tried mucinex and hydrattion. Not improving.  Some coughing at night. Some dry and some productive.  No fever, no chills or sweats.   Faint sinus pressure and some left ear pressure.   Pt uses some afrin and flonase. Used afrin only rarely.   Some wheezing recently. No hx of asthma.   Review of Systems  HENT: Positive for congestion. Negative for dental problem, postnasal drip, rhinorrhea, sinus pressure, sore throat and trouble swallowing.   Respiratory: Positive for cough and wheezing. Negative for shortness of breath.   Cardiovascular: Negative for chest pain and palpitations.  Gastrointestinal: Negative for abdominal pain.  Musculoskeletal: Negative for back pain.  Skin: Negative for rash.  Neurological: Negative for dizziness and headaches.  Hematological: Negative for adenopathy. Does not bruise/bleed easily.  Psychiatric/Behavioral: Negative for behavioral problems and confusion.    Past Medical History:  Diagnosis Date  . Dental crowns present   . Hyperlipidemia   . Prediabetes 09/04/2014  . Seasonal allergies   . Sebaceous cyst 04/2013   upper back  . Sleep apnea    uses CPAP nightly     Social History   Social History  . Marital status: Married    Spouse name: N/A  . Number of children: 3  . Years of education: N/A   Occupational History  .  manufactoring rep, A/C units  [employer]   Social History Main Topics  . Smoking status: Never Smoker  . Smokeless tobacco: Never Used  . Alcohol use Yes     Comment: 1 glass wine every other day  . Drug use: No  . Sexual activity: Not on file   Other Topics Concern  . 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 ADENOIDECTOMY     as a child  . CHOLECYSTECTOMY  01/12/2012   Procedure: LAPAROSCOPIC CHOLECYSTECTOMY WITH INTRAOPERATIVE CHOLANGIOGRAM;  Surgeon: [name], MD;  Location: [facility];  Service: General;  Laterality: N/A;  . CYST EXCISION     cyst removal from back  . EAR CYST EXCISION N/A 05/04/2013   Procedure: Excision subcutaneous mass posterior neck;  Surgeon: Imogene Burn. [name], MD;  Location: [facility];  Service: General;  Laterality: N/A;    Family History  Problem Relation Age of Onset  . Cancer Mother     ovarian  . Hypertension Mother   . Cancer Brother     leukemia  . Colon cancer Maternal Aunt 73  . Cancer Paternal Grandmother     breast  . Cancer Father 57    renal  . Prostate cancer Neg Hx   . Diabetes Neg Hx   . Esophageal cancer Neg Hx   . Rectal cancer Neg Hx   . Stomach cancer Neg Hx   . CAD Neg Hx     No Known Allergies  Current Outpatient Prescriptions on File Prior to Visit  Medication Sig Dispense Refill  . aspirin 81 MG tablet Take  81 mg by mouth daily.    [name] atorvastatin (LIPITOR) 40 MG tablet Take 1 tablet (40 mg total) by mouth daily. 30 tablet 6  . CO ENZYME Q-10 PO Take by mouth daily.    . fish oil-omega-3 fatty acids 1000 MG capsule Take 1 g by mouth daily.    Javier Docker Oil 500 MG CAPS Take 500 mg by mouth daily.    . Melatonin 1 MG CAPS Take 1 mg by mouth daily.    . Multiple Vitamins-Minerals (CENTRUM PO) Take 1 tablet by mouth daily.    . psyllium (METAMUCIL) 58.6 % packet Take 1 packet by mouth daily. Reported on 05/22/2016    . Turmeric 500 MG CAPS Take 500 mg by mouth daily.    Cristino Martes Root 450 MG CAPS Take 450 mg by mouth daily.     No current facility-administered medications on file prior to visit.     BP (!) 141/77   Pulse 77   Temp 98.6 F (37 C) (Oral)   Ht 5\' 11"  (1.803 m)   Wt (!) 334 lb 9.6 oz (151.8 kg)   SpO2  97%   BMI 46.67 kg/m      Objective:   Physical Exam  General  Mental Status - Alert. General Appearance - Well groomed. Not in acute distress.  Skin Rashes- No Rashes.  HEENT Head- Normal. Ear Auditory Canal - Left- Normal. Right - Normal.Tympanic Membrane- Left- Normal. Right- Normal. Eye Sclera/Conjunctiva- Left- Normal. Right- Normal. Nose & Sinuses Nasal Mucosa- Left-  Boggy and Congested. Right-  Boggy and  Congested.Bilateral no maxillary and  No frontal sinus pressure. Mouth & Throat Lips: Upper Lip- Normal: no dryness, cracking, pallor, cyanosis, or vesicular eruption. Lower Lip-Normal: no dryness, cracking, pallor, cyanosis or vesicular eruption. Buccal Mucosa- Bilateral- No Aphthous ulcers. Oropharynx- No Discharge or Erythema. Tonsils: Characteristics- Bilateral- No Erythema or Congestion. Size/Enlargement- Bilateral- No enlargement. Discharge- bilateral-None.  Neck Neck- Supple. No Masses.   Chest and Lung Exam Auscultation: Breath Sounds:- even and unlabored.(but faint scattered rhonchi and wheezing upper lobes on deep breathing)  Cardiovascular Auscultation:Rythm- Regular, rate and rhythm. Murmurs & Other Heart Sounds:Ausculatation of the heart reveal- No Murmurs.  Lymphatic Head & Neck General Head & Neck Lymphatics: Bilateral: Description- No Localized lymphadenopathy.       Assessment & Plan:  You appear to have bronchitis. Rest hydrate and tylenol for fever. I am prescribing cough medicine tussionex, and doxycycline antibiotic. For your nasal congestion you could use the flonase counter nasal steroid.   For wheezing making albuterol available.   You should gradually get better. If not then notify us and would recommend a chest xray.   Follow up in 7-10 days or as needed  Conroy Goracke, Percell Miller, [employer]

## 2016-08-31 NOTE — Patient Instructions (Addendum)
You appear to have bronchitis. Rest hydrate and tylenol for fever. I am prescribing cough medicine tussionex, and doxycycline antibiotic. For your nasal congestion you could use the flonase counter nasal steroid.   For wheezing making albuterol available.  You should gradually get better. If not then notify us and would recommend a chest xray.  Follow up in 7-10 days or as needed

## 2016-09-22 ENCOUNTER — Other Ambulatory Visit: Payer: Self-pay | Admitting: Medical

## 2016-10-07 DIAGNOSIS — N209 Urinary calculus, unspecified: Secondary | ICD-10-CM

## 2016-10-07 HISTORY — DX: Urinary calculus, unspecified: N20.9

## 2016-11-04 ENCOUNTER — Ambulatory Visit (INDEPENDENT_AMBULATORY_CARE_PROVIDER_SITE_OTHER): Payer: 59 | Admitting: Pulmonary Disease

## 2016-11-04 ENCOUNTER — Encounter: Payer: Self-pay | Admitting: Pulmonary Disease

## 2016-11-04 VITALS — BP 144/96 | HR 77 | Ht 71.0 in | Wt 336.8 lb

## 2016-11-04 DIAGNOSIS — Z9989 Dependence on other enabling machines and devices: Secondary | ICD-10-CM

## 2016-11-04 DIAGNOSIS — Z6841 Body Mass Index (BMI) 40.0 and over, adult: Secondary | ICD-10-CM

## 2016-11-04 DIAGNOSIS — G4733 Obstructive sleep apnea (adult) (pediatric): Secondary | ICD-10-CM | POA: Diagnosis not present

## 2016-11-04 NOTE — Patient Instructions (Signed)
Flu shot today Follow up in 1 year 

## 2016-11-04 NOTE — Progress Notes (Signed)
Current Outpatient Prescriptions on File Prior to Visit  Medication Sig  . aspirin 81 MG tablet Take 81 mg by mouth daily.  [name] atorvastatin (LIPITOR) 40 MG tablet Take 1 tablet (40 mg total) by mouth daily.  . chlorpheniramine-HYDROcodone (TUSSIONEX PENNKINETIC ER) 10-8 MG/5ML SUER Take 5 mLs by mouth every 12 (twelve) hours as needed for cough.  . CO ENZYME Q-10 PO Take by mouth daily.  [name] doxycycline (VIBRA-TABS) 100 MG tablet Take 1 tablet (100 mg total) by mouth 2 (two) times daily.  . fish oil-omega-3 fatty acids 1000 MG capsule Take 1 g by mouth daily.  Javier Docker Oil 500 MG CAPS Take 500 mg by mouth daily.  . Melatonin 1 MG CAPS Take 1 mg by mouth daily.  . Multiple Vitamins-Minerals (CENTRUM PO) Take 1 tablet by mouth daily.  . psyllium (METAMUCIL) 58.6 % packet Take 1 packet by mouth daily. Reported on 05/22/2016  . Turmeric 500 MG CAPS Take 500 mg by mouth daily.  Cristino Martes Root 450 MG CAPS Take 450 mg by mouth daily.  . VENTOLIN HFA 108 (90 Base) MCG/ACT inhaler INHALE 2 PUFFS INTO THE LUNGS EVERY 6 HOURS AS NEEDED FOR WHEEZING OR SHORTNESS OF BREATH   No current facility-administered medications on file prior to visit.     Chief Complaint  Patient presents with  . Follow-up    Wears CPAP nightly. Denies problems with mask/pressure. DME: [facility] ; Flu vaccine today if able    Sleep tests CPAP 08/28/15 to 09/26/15 >> used on 30 of 30 nights with average 6 hrs and 36 min.  Average AHI is 1.1 with CPAP 10 cm H2O CPAP 10/05/16 to 11/03/16 >> used on 30 of 30 nights with average 7 hrs 7 min.  Average AHI 1.6 with CPAP 10 cm H2O  Past medical history HLD, Allergies, Pre-DM, Nephrolithiasis  Past surgical hx, Medications, Allergies, Family hx, Social hx all reviewed.  Vital signs BP (!) 144/96 (BP Location: Left Arm, Cuff Size: Normal)   Pulse 77   Ht 5\' 11"  (1.803 m)   Wt (!) 336 lb 12.8 oz (152.8 kg)   SpO2 97%   BMI 46.97 kg/m    History of Present Illness: Bryan [name]  is a 55 y.o. male with OSA.  He has been doing well with CPAP.  Can't sleep w/o machine.  No issues with mask fit.  He ordered CPAP cleaner, but hasn't received it yet.  He was in [place] few weeks ago with kidney stone.  Physical Exam:  General - No distress ENT - No sinus tenderness, no oral exudate, no LAN, MP 3 Cardiac - s1s2 regular, no murmur Chest - No wheeze/rales/dullness Back - No focal tenderness Abd - Soft, non-tender Ext - No edema Neuro - Normal strength Skin - No rashes Psych - normal mood, and behavior   Assessment/Plan:  Obstructive sleep apnea. - He is compliant with CPAP and reports benefit from therapy. - continue CPAP 10 cm H2O - he is to check with his DME when he is eligible for new machine >> he would like mini CPAP since he travels a lot - advised him to check with [employer] to get on list for restarting electricity in the event of a power outage  Obesity. - discussed techniques to assist with weight loss  Nephrolithiasis. - advised him to d/w his PCP further about preventative measures he can do   Patient Instructions  Flu shot today  Follow up in 1 year  Chesley Mires, MD [facility] Pager:  [phone] 11/04/2016, 10:01 AM

## 2016-11-16 ENCOUNTER — Encounter: Payer: Self-pay | Admitting: Internal Medicine

## 2016-11-16 ENCOUNTER — Ambulatory Visit (INDEPENDENT_AMBULATORY_CARE_PROVIDER_SITE_OTHER): Payer: 59 | Admitting: Internal Medicine

## 2016-11-16 VITALS — BP 118/78 | HR 81 | Temp 98.1°F | Resp 14 | Ht 71.0 in | Wt 334.5 lb

## 2016-11-16 DIAGNOSIS — E785 Hyperlipidemia, unspecified: Secondary | ICD-10-CM | POA: Diagnosis not present

## 2016-11-16 DIAGNOSIS — R7303 Prediabetes: Secondary | ICD-10-CM | POA: Diagnosis not present

## 2016-11-16 LAB — HEMOGLOBIN A1C: Hgb A1c MFr Bld: 6 % (ref 4.6–6.5)

## 2016-11-16 LAB — LIPID PANEL
CHOL/HDL RATIO: 5
Cholesterol: 226 mg/dL — ABNORMAL HIGH (ref 0–200)
HDL: 41.4 mg/dL (ref >39.00)
NONHDL: 184.44
TRIGLYCERIDES: 265 mg/dL — AB (ref 0.0–149.0)
VLDL: 53 mg/dL — ABNORMAL HIGH (ref 0.0–40.0)

## 2016-11-16 LAB — LDL CHOLESTEROL, DIRECT: LDL DIRECT: 136 mg/dL

## 2016-11-16 NOTE — Patient Instructions (Signed)
GO TO THE LAB : Get the blood work     GO TO THE FRONT DESK Schedule your next appointment for a  Physical in about 6 months

## 2016-11-16 NOTE — Assessment & Plan Note (Signed)
Prediabetes: Will check A1c. Hyperlipidemia:  doing better with exercise, going to the gym daily,  + portion control. Check a FLP. If needed, he is not opposed to go back on medication. Had a flu shot. RTC 6 months, CPX

## 2016-11-16 NOTE — Progress Notes (Signed)
Pre visit review using our clinic review tool, if applicable. No additional management support is needed unless otherwise documented below in the visit note. 

## 2016-11-16 NOTE — Progress Notes (Signed)
Subjective:    Patient ID: Bryan [name], male    DOB: [birth year], 55 y.o.   MRN: [id number]  DOS:  11/16/2016 Type of visit - description : Routine checkup Interval history: High cholesterol, not taking statins, has improved his diet and physical activity. Likes to see how his numbers look. Also, had a kidney stone, it passed, he kept it just in case. CT done at the ER reviewed, had a single stone. He was seen with a respiratory infection, now asx    Review of Systems No fever chills. No gross hematuria  Past Medical History:  Diagnosis Date  . Dental crowns present   . Hyperlipidemia   . Nephrolithiasis 2017  . Prediabetes 09/04/2014  . Seasonal allergies   . Sebaceous cyst 04/2013   upper back  . Sleep apnea    uses CPAP nightly    Past Surgical History:  Procedure Laterality Date  . ADENOIDECTOMY     as a child  . CHOLECYSTECTOMY  01/12/2012   Procedure: LAPAROSCOPIC CHOLECYSTECTOMY WITH INTRAOPERATIVE CHOLANGIOGRAM;  Surgeon: [name], MD;  Location: [facility];  Service: General;  Laterality: N/A;  . CYST EXCISION     cyst removal from back  . EAR CYST EXCISION N/A 05/04/2013   Procedure: Excision subcutaneous mass posterior neck;  Surgeon: Imogene Burn. [name], MD;  Location: [facility];  Service: General;  Laterality: N/A;    Social History   Social History  . Marital status: Married    Spouse name: N/A  . Number of children: 3  . Years of education: N/A   Occupational History  .  manufactoring rep, A/C units  [employer]   Social History Main Topics  . Smoking status: Never Smoker  . Smokeless tobacco: Never Used  . Alcohol use Yes     Comment: 1 glass wine every other day  . Drug use: No  . Sexual activity: Not on file   Other Topics Concern  . 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 Always use your most recent med list.          aspirin 81 MG tablet Take 81 mg by mouth daily.   CENTRUM PO Take 1 tablet by mouth daily.   CO ENZYME Q-10 PO Take by mouth daily.   fish oil-omega-3 fatty acids 1000 MG capsule Take 1 g by mouth daily.   Krill Oil 500 MG Caps Take 500 mg by mouth daily.   Melatonin 1 MG Caps Take 1 mg by mouth daily.   psyllium 58.6 % packet Commonly known as:  METAMUCIL Take 1 packet by mouth daily. Reported on 05/22/2016   Turmeric 500 MG Caps Take 500 mg by mouth daily.   Valerian Root 450 MG Caps Take 450 mg by mouth daily.          Objective:   Physical Exam BP 118/78 (BP Location: Left Arm, Patient Position: Sitting, Cuff Size: Normal)   Pulse 81   Temp 98.1 F (36.7 C) (Oral)   Resp 14   Ht 5\' 11"  (1.803 m)   Wt (!) 334 lb 8  oz (151.7 kg)   SpO2 98%   BMI 46.65 kg/m  General:   Well developed, well nourished . NAD.  HEENT:  Normocephalic . Face symmetric, atraumatic Lungs:  CTA B Normal respiratory effort, no intercostal retractions, no accessory muscle use. Heart: RRR,  no murmur.  No pretibial edema bilaterally  Skin: Not pale. Not jaundice Neurologic:  alert & oriented X3.  Speech normal, gait appropriate for age and unassisted Psych--  Cognition and judgment appear intact.  Cooperative with normal attention span and concentration.  Behavior appropriate. No anxious or depressed appearing.      Assessment & Plan:   Assessment Prediabetes Hyperlipidemia Insomnia -- on OTCs Morbid obesity, BMI 46 OSA, CPAP Seasonal allergies Kidney stone (first) 10-2016  PLAN: Prediabetes: Will check A1c. Hyperlipidemia:  doing better with exercise, going to the gym daily,  + portion control. Check a FLP. If needed, he is not opposed to go back on medication. Had a flu shot. RTC 6 months, CPX

## 2016-11-18 MED ORDER — ATORVASTATIN CALCIUM 20 MG PO TABS: 20.0000 mg | ORAL_TABLET | Freq: Every day | ORAL | 2 refills | Status: DC

## 2016-11-18 NOTE — Addendum Note (Signed)
Addended byDamita Dunnings D on: 11/18/2016 08:58 AM   Modules accepted: Orders

## 2017-04-01 ENCOUNTER — Ambulatory Visit (INDEPENDENT_AMBULATORY_CARE_PROVIDER_SITE_OTHER): Payer: 59 | Admitting: Medical

## 2017-04-01 ENCOUNTER — Encounter: Payer: Self-pay | Admitting: Medical

## 2017-04-01 VITALS — BP 152/81 | HR 97 | Temp 98.1°F | Resp 16 | Ht 71.0 in | Wt 326.6 lb

## 2017-04-01 DIAGNOSIS — J301 Allergic rhinitis due to pollen: Secondary | ICD-10-CM | POA: Diagnosis not present

## 2017-04-01 DIAGNOSIS — H6690 Otitis media, unspecified, unspecified ear: Secondary | ICD-10-CM | POA: Diagnosis not present

## 2017-04-01 DIAGNOSIS — J0100 Acute maxillary sinusitis, unspecified: Secondary | ICD-10-CM

## 2017-04-01 MED ORDER — CEFTRIAXONE SODIUM 1 G IJ SOLR
1.0000 g | Freq: Once | INTRAMUSCULAR | Status: AC
  Administered 2017-04-01: 1 g via INTRAMUSCULAR

## 2017-04-01 MED ORDER — AMOXICILLIN-POT CLAVULANATE 875-125 MG PO TABS: 1.0000 | ORAL_TABLET | Freq: Two times a day (BID) | ORAL | 0 refills | Status: DC

## 2017-04-01 MED ORDER — AZELASTINE HCL 0.1 % NA SOLN: 2.0000 | Freq: Two times a day (BID) | NASAL | 3 refills | Status: DC

## 2017-04-01 NOTE — Progress Notes (Signed)
Subjective:    Patient ID: Bryan [name], male    DOB: Jul 19, 1961, 56 y.o.   MRN: [id number]  HPI  Pt in for some severe left ear pain. Hx of some fluid in ear on and off. Pt states taks zyrtec for allergies and some flonase as well. Congestion over past week before ear pain.  In past he states feels faint mild irriation in past.   Pt states some sinus pressure left side. But severe ear pain.  Pt states allergies this time of year in past. Some recent nasal congestion.   Review of Systems  Constitutional: Negative for chills, diaphoresis, fatigue and fever.  HENT: Positive for congestion, ear pain, sinus pain and sinus pressure.   Respiratory: Negative for cough, chest tightness, shortness of breath and wheezing.   Cardiovascular: Negative for chest pain and palpitations.  Gastrointestinal: Negative for abdominal pain.  Musculoskeletal: Negative for back pain.  Skin: Negative for rash.  Neurological: Negative for dizziness and headaches.  Hematological: Negative for adenopathy. Does not bruise/bleed easily.  Psychiatric/Behavioral: Negative for behavioral problems and confusion.    Past Medical History:  Diagnosis Date  . Dental crowns present   . Hyperlipidemia   . Nephrolithiasis 2017  . Prediabetes 09/04/2014  . Seasonal allergies   . Sebaceous cyst 04/2013   upper back  . Sleep apnea    uses CPAP nightly     Social History   Social History  . Marital status: Married    Spouse name: N/A  . Number of children: 3  . Years of education: N/A   Occupational History  .  manufactoring rep, A/C units  [employer]   Social History Main Topics  . Smoking status: Never Smoker  . Smokeless tobacco: Never Used  . Alcohol use Yes     Comment: 1 glass wine every other day  . Drug use: No  . Sexual activity: Not on file   Other Topics Concern  . 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 ADENOIDECTOMY     as a child  . CHOLECYSTECTOMY  01/12/2012   Procedure: LAPAROSCOPIC CHOLECYSTECTOMY WITH INTRAOPERATIVE CHOLANGIOGRAM;  Surgeon: [name], MD;  Location: [facility];  Service: General;  Laterality: N/A;  . CYST EXCISION     cyst removal from back  . EAR CYST EXCISION N/A 05/04/2013   Procedure: Excision subcutaneous mass posterior neck;  Surgeon: Imogene Burn. [name], MD;  Location: [place];  Service: General;  Laterality: N/A;    Family History  Problem Relation Age of Onset  . Cancer Mother     ovarian  . Hypertension Mother   . Cancer Brother     leukemia  . Colon cancer Maternal Aunt 48  . Cancer Paternal Grandmother     breast  . Cancer Father 25    renal  . Prostate cancer Neg Hx   . Diabetes Neg Hx   . Esophageal cancer Neg Hx   . Rectal cancer Neg Hx   . Stomach cancer Neg Hx   . CAD Neg Hx     No Known Allergies  Current Outpatient Prescriptions on File Prior to Visit  Medication Sig  Dispense Refill  . aspirin 81 MG tablet Take 81 mg by mouth daily.    [name] atorvastatin (LIPITOR) 20 MG tablet Take 1 tablet (20 mg total) by mouth at bedtime. 30 tablet 2  . CO ENZYME Q-10 PO Take by mouth daily.    . fish oil-omega-3 fatty acids 1000 MG capsule Take 1 g by mouth daily.    Javier Docker Oil 500 MG CAPS Take 500 mg by mouth daily.    . Melatonin 1 MG CAPS Take 1 mg by mouth daily.    . Multiple Vitamins-Minerals (CENTRUM PO) Take 1 tablet by mouth daily.    . psyllium (METAMUCIL) 58.6 % packet Take 1 packet by mouth daily. Reported on 05/22/2016    . Turmeric 500 MG CAPS Take 500 mg by mouth daily.    Cristino Martes Root 450 MG CAPS Take 450 mg by mouth daily.     No current facility-administered medications on file prior to visit.     BP (!) 152/81 (BP Location: Left Arm, Patient Position: Sitting, Cuff Size:  Large)   Pulse 97   Temp 98.1 F (36.7 C) (Oral)   Resp 16   Ht 5\' 11"  (1.803 m)   Wt (!) 326 lb 9.6 oz (148.1 kg)   SpO2 97%   BMI 45.55 kg/m       Objective:   Physical Exam   General  Mental Status - Alert. General Appearance - Well groomed. Not in acute distress.  Skin Rashes- No Rashes.  HEENT Head- Normal. Ear Auditory Canal - Left- Normal. Right - Normal.Tympanic Membrane- Left- severe bright red tm with bulge. Right- Normal. Eye Sclera/Conjunctiva- Left- Normal. Right- Normal. Nose & Sinuses Nasal Mucosa- Left-  Boggy and Congested. Right-  Boggy and  Congested.Left maxillary and  Left frontal sinus pressure. Mouth & Throat Lips: Upper Lip- Normal: no dryness, cracking, pallor, cyanosis, or vesicular eruption. Lower Lip-Normal: no dryness, cracking, pallor, cyanosis or vesicular eruption. Buccal Mucosa- Bilateral- No Aphthous ulcers. Oropharynx- No Discharge or Erythema. Tonsils: Characteristics- Bilateral- No Erythema or Congestion. Size/Enlargement- Bilateral- No enlargement. Discharge- bilateral-None.  Neck Neck- Supple. No Masses.   Chest and Lung Exam Auscultation: Breath Sounds:-Clear even and unlabored.  Cardiovascular Auscultation:Rythm- Regular, rate and rhythm. Murmurs & Other Heart Sounds:Ausculatation of the heart reveal- No Murmurs.  Lymphatic Head & Neck General Head & Neck Lymphatics: Bilateral: Description- No Localized lymphadenopathy.      Assessment & Plan:  For recent allergy symptoms continue flonase and rx astelin as well. Can use otc antihistamine as well.  For sinus infection and ear infection rx augmentin antibiotic. Also rocephin 1 gram im in office.(by appearance of your TM I do think rocephin good idea)  Follow up in 7 days or as needed  Kermit Arnette, Percell Miller, [employer]

## 2017-04-01 NOTE — Patient Instructions (Addendum)
For recent allergy symptoms continue flonase and rx astelin as well. Can use otc antihistamine as well.  For sinus infection and ear infection rx augmentin antibiotic. Also rocephin 1 gram im in office.(by appearance of your TM I do think rocephin good idea)  Follow up in 7 days or as needed

## 2017-05-25 ENCOUNTER — Encounter: Payer: 59 | Admitting: Internal Medicine

## 2017-06-07 ENCOUNTER — Other Ambulatory Visit: Payer: Self-pay | Admitting: Internal Medicine

## 2017-06-23 ENCOUNTER — Encounter: Payer: Self-pay | Admitting: Internal Medicine

## 2017-06-23 ENCOUNTER — Ambulatory Visit (INDEPENDENT_AMBULATORY_CARE_PROVIDER_SITE_OTHER): Payer: 59 | Admitting: Internal Medicine

## 2017-06-23 VITALS — BP 135/73 | HR 65 | Temp 98.4°F | Resp 14 | Ht 71.0 in | Wt 331.2 lb

## 2017-06-23 DIAGNOSIS — N200 Calculus of kidney: Secondary | ICD-10-CM

## 2017-06-23 DIAGNOSIS — N209 Urinary calculus, unspecified: Secondary | ICD-10-CM | POA: Insufficient documentation

## 2017-06-23 DIAGNOSIS — Z0000 Encounter for general adult medical examination without abnormal findings: Secondary | ICD-10-CM

## 2017-06-23 DIAGNOSIS — Z1159 Encounter for screening for other viral diseases: Secondary | ICD-10-CM

## 2017-06-23 LAB — LIPID PANEL
CHOLESTEROL: 145 mg/dL (ref 0–200)
HDL: 45.2 mg/dL (ref >39.00)
LDL Cholesterol: 74 mg/dL (ref 0–99)
NonHDL: 99.85
TRIGLYCERIDES: 131 mg/dL (ref 0.0–149.0)
Total CHOL/HDL Ratio: 3
VLDL: 26.2 mg/dL (ref 0.0–40.0)

## 2017-06-23 LAB — HEPATITIS C ANTIBODY: HCV Ab: NEGATIVE

## 2017-06-23 LAB — COMPREHENSIVE METABOLIC PANEL
ALBUMIN: 4.3 g/dL (ref 3.5–5.2)
ALT: 25 U/L (ref 0–53)
AST: 16 U/L (ref 0–37)
Alkaline Phosphatase: 46 U/L (ref 39–117)
BILIRUBIN TOTAL: 0.6 mg/dL (ref 0.2–1.2)
BUN: 12 mg/dL (ref 6–23)
CALCIUM: 9.2 mg/dL (ref 8.4–10.5)
CO2: 26 mEq/L (ref 19–32)
CREATININE: 0.83 mg/dL (ref 0.40–1.50)
Chloride: 103 mEq/L (ref 96–112)
GFR: 102.6 mL/min (ref >60.00)
Glucose, Bld: 104 mg/dL — ABNORMAL HIGH (ref 70–99)
Potassium: 4.3 mEq/L (ref 3.5–5.1)
Sodium: 139 mEq/L (ref 135–145)
Total Protein: 6.8 g/dL (ref 6.0–8.3)

## 2017-06-23 LAB — CBC WITH DIFFERENTIAL/PLATELET
BASOS PCT: 1 % (ref 0.0–3.0)
Basophils Absolute: 0.1 10*3/uL (ref 0.0–0.1)
EOS ABS: 0.1 10*3/uL (ref 0.0–0.7)
Eosinophils Relative: 2.4 % (ref 0.0–5.0)
HEMATOCRIT: 43.5 % (ref 39.0–52.0)
HEMOGLOBIN: 14.3 g/dL (ref 13.0–17.0)
LYMPHS PCT: 28.3 % (ref 12.0–46.0)
Lymphs Abs: 1.7 10*3/uL (ref 0.7–4.0)
MCHC: 32.9 g/dL (ref 30.0–36.0)
MCV: 86.5 fl (ref 78.0–100.0)
MONOS PCT: 7.4 % (ref 3.0–12.0)
Monocytes Absolute: 0.4 10*3/uL (ref 0.1–1.0)
NEUTROS ABS: 3.6 10*3/uL (ref 1.4–7.7)
Neutrophils Relative %: 60.9 % (ref 43.0–77.0)
Platelets: 253 10*3/uL (ref 150.0–400.0)
RBC: 5.03 Mil/uL (ref 4.22–5.81)
RDW: 13.8 % (ref 11.5–15.5)
WBC: 5.9 10*3/uL (ref 4.0–10.5)

## 2017-06-23 LAB — TSH: TSH: 1.31 u[IU]/mL (ref 0.35–4.50)

## 2017-06-23 LAB — PSA: PSA: 0.67 ng/mL (ref 0.10–4.00)

## 2017-06-23 LAB — HEMOGLOBIN A1C: Hgb A1c MFr Bld: 5.9 % (ref 4.6–6.5)

## 2017-06-23 NOTE — Patient Instructions (Signed)
GO TO THE LAB : Get the blood work     GO TO THE FRONT DESK Schedule your next appointment for a  physical exam in one year   Please see the bariatric doctor at the [facility]

## 2017-06-23 NOTE — Progress Notes (Signed)
Subjective:    Patient ID: Bryan [name], male    DOB: [birth year], 56 y.o.   MRN: [id number]  DOS:  06/23/2017 Type of visit - description : cpx Interval history: Feeling well since the last visit   Review of Systems Was seen with a L ear infection few months ago, still feels fullness, hearing is slightly muffled. No pain, discharge or actual nasal congestion.   Other than above, a 14 point review of systems is negative     Past Medical History:  Diagnosis Date  . Dental crowns present   . Hyperlipidemia   . Nephrolithiasis 2017  . Prediabetes 09/04/2014  . Seasonal allergies   . Sebaceous cyst 04/2013   upper back  . Sleep apnea    uses CPAP nightly  . Urolithiasis 10/2016    Past Surgical History:  Procedure Laterality Date  . ADENOIDECTOMY     as a child  . CHOLECYSTECTOMY  01/12/2012   Procedure: LAPAROSCOPIC CHOLECYSTECTOMY WITH INTRAOPERATIVE CHOLANGIOGRAM;  Surgeon: [name], MD;  Location: [facility];  Service: General;  Laterality: N/A;  . CYST EXCISION     cyst removal from back  . EAR CYST EXCISION N/A 05/04/2013   Procedure: Excision subcutaneous mass posterior neck;  Surgeon: Imogene Burn. [name], MD;  Location: [facility];  Service: General;  Laterality: N/A;    Social History   Social History  . Marital status: Married    Spouse name: N/A  . Number of children: 3  . Years of education: N/A   Occupational History  .  manufactoring rep, A/C units  [employer]   Social History Main Topics  . Smoking status: Never Smoker  . Smokeless tobacco: Never Used  . Alcohol use Yes     Comment: 1 glass wine every other day  . Drug use: No  . Sexual activity: Not on file   Other Topics Concern  . 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 Cancer Mother        ovarian  . Hypertension Mother   . Cancer Brother        leukemia  . Diabetes Brother   . Colon cancer Maternal Aunt 1  . Cancer Paternal Grandmother        breast  . Cancer Father 25       renal  . Prostate cancer Neg Hx   . Esophageal cancer Neg Hx   . Rectal cancer Neg Hx   . Stomach cancer Neg Hx   . CAD Neg Hx      Allergies as of 06/23/2017   No Known Allergies     Medication List       Accurate as of 06/23/17 11:59 PM. Always use your most recent med list.          aspirin 81 MG tablet Take 81 mg by mouth daily.   atorvastatin 20 MG tablet Commonly known as:  LIPITOR Take 1 tablet (20 mg total) by mouth at bedtime.   azelastine 0.1 % nasal spray Commonly known as:  ASTELIN Place 2 sprays into both nostrils at bedtime as needed for rhinitis. Use in each nostril  as directed   CENTRUM PO Take 1 tablet by mouth daily.   CO ENZYME Q-10 PO Take by mouth daily.   fish oil-omega-3 fatty acids 1000 MG capsule Take 1 g by mouth daily.   Krill Oil 500 MG Caps Take 500 mg by mouth daily.   Melatonin 1 MG Caps Take 1 mg by mouth daily.   psyllium 58.6 % packet Commonly known as:  METAMUCIL Take 1 packet by mouth daily. Reported on 05/22/2016   Turmeric 500 MG Caps Take 500 mg by mouth daily.   Valerian Root 450 MG Caps Take 450 mg by mouth daily.          Objective:   Physical Exam BP 135/73 (BP Location: Left Arm, Patient Position: Sitting, Cuff Size: Normal)   Pulse 65   Temp 98.4 F (36.9 C) (Oral)   Resp 14   Ht 5\' 11"  (1.803 m)   Wt (!) 331 lb 4 oz (150.3 kg)   SpO2 97%   BMI 46.20 kg/m   General:   Well developed, morbidly obese appearing, no distress Neck: No  thyromegaly  HEENT:  Normocephalic . Face symmetric, atraumatic. Right TM normal, left TM bulge, not red. Lungs:  CTA B Normal respiratory effort, no intercostal retractions, no accessory muscle use. Heart: RRR,  no murmur.  No pretibial  edema bilaterally  Abdomen:  Not distended, soft, non-tender. No rebound or rigidity.   Rectal:  External abnormalities: none. Normal sphincter tone. No rectal masses or tenderness.  Stool brown  Prostate: Exam limited by patient's size, what I was able to palpate was normal.  Skin: Exposed areas without rash. Not pale. Not jaundice Neurologic:  alert & oriented X3.  Speech normal, gait appropriate for age and unassisted Strength symmetric and appropriate for age.  Psych: Cognition and judgment appear intact.  Cooperative with normal attention span and concentration.  Behavior appropriate. No anxious or depressed appearing.    Assessment & Plan:    Assessment Prediabetes Hyperlipidemia Insomnia -- on OTCs Morbid obesity, BMI 46 OSA, CPAP Seasonal allergies Kidney stone (first) 10-2016  PLAN: Prediabetes, checking labs Hyperlipidemia: On Lipitor, labs Morbid obesity: Since last year is trying to eat healthier and exercise more, has not been successful consistently. Options discussed, recommend to visit the bariatric clinic. Serous otitis: Persisting left ear symptoms, likely serous otitis, recommend consistent use of Flonase and Astelin, if not better will call for a ENT referral RTC one year. Sooner if needed.

## 2017-06-23 NOTE — Progress Notes (Signed)
Pre visit review using our clinic review tool, if applicable. No additional management support is needed unless otherwise documented below in the visit note. 

## 2017-06-23 NOTE — Assessment & Plan Note (Addendum)
-  Td 2011; shingrex discussed, elected to wait, will call if likes to proceed -[facility]S : +FH Mat. Aunt had colon Ca Colonoscopy: aprox  12/07/2002,  normal . cscope 2015- polyps, next 5 years  -Prostate ca screening : DRE done today, limited but wnl, check a PSA   - diet, exercise: Discussed.   -Labs: CMP, FLP, CBC, A1c, TSH, PSA, hep c

## 2017-06-24 NOTE — Assessment & Plan Note (Signed)
Prediabetes, checking labs Hyperlipidemia: On Lipitor, labs Morbid obesity: Since last year is trying to eat healthier and exercise more, has not been successful consistently. Options discussed, recommend to visit the bariatric clinic. Serous otitis: Persisting left ear symptoms, likely serous otitis, recommend consistent use of Flonase and Astelin, if not better will call for a ENT referral RTC one year. Sooner if needed.

## 2017-08-09 ENCOUNTER — Other Ambulatory Visit: Payer: Self-pay | Admitting: Internal Medicine

## 2017-08-31 ENCOUNTER — Encounter (INDEPENDENT_AMBULATORY_CARE_PROVIDER_SITE_OTHER): Payer: Self-pay

## 2017-09-08 ENCOUNTER — Ambulatory Visit (INDEPENDENT_AMBULATORY_CARE_PROVIDER_SITE_OTHER): Payer: 59 | Admitting: Family Medicine

## 2017-09-08 ENCOUNTER — Encounter (INDEPENDENT_AMBULATORY_CARE_PROVIDER_SITE_OTHER): Payer: Self-pay | Admitting: Family Medicine

## 2017-09-08 VITALS — BP 144/84 | HR 70 | Temp 98.1°F | Ht 70.0 in | Wt 326.0 lb

## 2017-09-08 DIAGNOSIS — Z6841 Body Mass Index (BMI) 40.0 and over, adult: Secondary | ICD-10-CM | POA: Diagnosis not present

## 2017-09-08 DIAGNOSIS — R5383 Other fatigue: Secondary | ICD-10-CM | POA: Diagnosis not present

## 2017-09-08 DIAGNOSIS — Z0289 Encounter for other administrative examinations: Secondary | ICD-10-CM

## 2017-09-08 DIAGNOSIS — Z1331 Encounter for screening for depression: Secondary | ICD-10-CM

## 2017-09-08 DIAGNOSIS — R0602 Shortness of breath: Secondary | ICD-10-CM | POA: Diagnosis not present

## 2017-09-08 DIAGNOSIS — E7849 Other hyperlipidemia: Secondary | ICD-10-CM

## 2017-09-08 DIAGNOSIS — R030 Elevated blood-pressure reading, without diagnosis of hypertension: Secondary | ICD-10-CM | POA: Diagnosis not present

## 2017-09-08 DIAGNOSIS — I4510 Unspecified right bundle-branch block: Secondary | ICD-10-CM

## 2017-09-08 NOTE — Progress Notes (Signed)
Office: [phone]  /  Fax: [phone]   Dear Dr. Larose Kells,   Thank you for referring [name] to our clinic.The following note includes my evaluation and treatment recommendations.  HPI:   Chief Complaint: OBESITY    MATEJ SAPPENFIELD has been referred by Colon Branch, MD for consultation regarding his obesity and obesity related comorbidities.    SEABORN NAKAMA (MR# [id number]) is a 56 y.o. male who presents on 09/08/2017 for obesity evaluation and treatment. Current BMI is Body mass index is 46.78 kg/m.[name] Khalif has been struggling with his weight for many years and has been unsuccessful in either losing weight, maintaining weight loss, or reaching his healthy weight goal.     Delfino Lovett attended our information session and states he is currently in the action stage of change and ready to dedicate time achieving and maintaining a healthier weight. Jaedon is interested in becoming our patient and working on intensive lifestyle modifications including (but not limited to) diet, exercise and weight loss.    Wilberto states his family eats meals together he thinks his family will eat healthier with  him he struggles with family and or coworkers weight loss sabotage his desired weight loss is 115 lbs he started gaining weight in 2014 his heaviest weight ever was 336 lbs. he has significant food cravings issues  he snacks frequently in the evenings he is frequently drinking liquids with calories he frequently makes poor food choices he frequently eats larger portions than normal  he has binge eating behaviors he struggles with emotional eating    Fatigue Jenny feels his energy is lower than it should be. This has worsened with weight gain and has not worsened recently. Jaydrian admits to daytime somnolence and  admits to waking up still tired. Patient has a diagnosis of sleep apnea which may be contributing to his fatigue. Patent has a history of symptoms of daytime fatigue and  morning fatigue. Patient generally gets 5 hours of sleep per night, and states they generally have restless sleep. Snoring is present. Apneic episodes are present. Epworth Sleepiness Score is 3  Dyspnea on exertion Daksh notes increasing shortness of breath with exercising and seems to be worsening over time with weight gain. He notes getting out of breath sooner with activity than he used to. This has not gotten worse recently. Hiroshi denies orthopnea.  Elevated Blood Pressure without History of Hypertension BLEASE CAPALDI is a 56 y.o. male with a history of sleep apnea, chart shows multiple borderline elevated blood pressure readings in the past. He is working weight loss to help control his blood pressure with the goal of decreasing his risk of heart attack and stroke. [name] blood pressure is not currently controlled.  Hyperlipidemia Malichi has hyperlipidemia and is on Lipitor, fish oil and CoQ10. He is attempting to improve his cholesterol levels with intensive lifestyle modification including a low saturated fat diet, exercise and weight loss. He denies any chest pain, claudication or myalgias.  Right Bundle Branch Block Markee has a diagnosis of right bundle branch block with a history of incomplete RBBB in 2013. He has a history of sleep apnea, morbid obesity and hyperlipidemia. Jen denies chest pain.  Depression Screen Jaan's Food and Mood (modified PHQ-9) score was  Depression screen PHQ 2/9 09/08/2017  Decreased Interest 1  Down, Depressed, Hopeless 1  PHQ - 2 Score 2  Altered sleeping 3  Tired, decreased energy 2  Change in appetite 2  Feeling bad or failure about  yourself  3  Trouble concentrating 1  Moving slowly or fidgety/restless 0  Suicidal thoughts 0  PHQ-9 Score 13  Difficult doing work/chores Not difficult at all    ALLERGIES: No Known Allergies  MEDICATIONS: Current Outpatient Prescriptions on File Prior to Visit  Medication Sig Dispense Refill   . aspirin 81 MG tablet Take 81 mg by mouth daily.    [name] atorvastatin (LIPITOR) 20 MG tablet Take 1 tablet (20 mg total) by mouth at bedtime. 90 tablet 3  . azelastine (ASTELIN) 0.1 % nasal spray Place 2 sprays into both nostrils at bedtime as needed for rhinitis. Use in each nostril as directed    . CO ENZYME Q-10 PO Take by mouth daily.    . fish oil-omega-3 fatty acids 1000 MG capsule Take 1 g by mouth daily.    Javier Docker Oil 500 MG CAPS Take 500 mg by mouth daily.    . Melatonin 1 MG CAPS Take 1 mg by mouth at bedtime as needed.     . Multiple Vitamins-Minerals (CENTRUM PO) Take 1 tablet by mouth daily.    . Turmeric 500 MG CAPS Take 500 mg by mouth daily.    Cristino Martes Root 450 MG CAPS Take 450 mg by mouth at bedtime as needed.      No current facility-administered medications on file prior to visit.     PAST MEDICAL HISTORY: Past Medical History:  Diagnosis Date  . Dental crowns present   . Gallbladder problem   . Hyperlipidemia   . Lower back pain   . Nephrolithiasis 2017  . Prediabetes 09/04/2014  . Seasonal allergies   . Sebaceous cyst 04/2013   upper back  . Sleep apnea    uses CPAP nightly  . Urolithiasis 10/2016    PAST SURGICAL HISTORY: Past Surgical History:  Procedure Laterality Date  . ADENOIDECTOMY     as a child  . CHOLECYSTECTOMY  01/12/2012   Procedure: LAPAROSCOPIC CHOLECYSTECTOMY WITH INTRAOPERATIVE CHOLANGIOGRAM;  Surgeon: [name], MD;  Location: [facility];  Service: General;  Laterality: N/A;  . CYST EXCISION     cyst removal from back  . EAR CYST EXCISION N/A 05/04/2013   Procedure: Excision subcutaneous mass posterior neck;  Surgeon: Imogene Burn. [name], MD;  Location: [facility];  Service: General;  Laterality: N/A;    SOCIAL HISTORY: Social History  Substance Use Topics  . Smoking status: Never Smoker  . Smokeless tobacco: Never Used  . Alcohol use Yes     Comment: 1 glass wine every other day    FAMILY HISTORY: Family  History  Problem Relation Age of Onset  . Cancer Mother        ovarian  . Hypertension Mother   . Hyperlipidemia Mother   . Obesity Mother   . Cancer Brother        leukemia  . Diabetes Brother   . Colon cancer Maternal Aunt 15  . Cancer Paternal Grandmother        breast  . Cancer Father 44       renal  . Kidney disease Father   . Sleep apnea Father   . Prostate cancer Neg Hx   . Esophageal cancer Neg Hx   . Rectal cancer Neg Hx   . Stomach cancer Neg Hx   . CAD Neg Hx     ROS: Review of Systems  Constitutional: Positive for malaise/fatigue.  HENT: Positive for ear discharge and sinus pain.   Eyes:  Wear Glasses or Contacts Floaters  Respiratory: Positive for shortness of breath (on exertion).   Cardiovascular: Negative for chest pain, orthopnea and claudication.  Musculoskeletal: Negative for myalgias.  Psychiatric/Behavioral: The patient has insomnia.     PHYSICAL EXAM: Blood pressure (!) 144/84, pulse 70, temperature 98.1 F (36.7 C), temperature source Oral, height 5\' 10"  (1.778 m), weight (!) 326 lb (147.9 kg), SpO2 97 %. Body mass index is 46.78 kg/m. Physical Exam  Constitutional: He is oriented to person, place, and time. He appears well-developed and well-nourished.  Pulmonary/Chest: Effort normal.  Musculoskeletal: Normal range of motion.  Neurological: He is oriented to person, place, and time.  Skin: Skin is warm and dry.  Psychiatric: He has a normal mood and affect. His behavior is normal.  Vitals reviewed.   RECENT LABS AND TESTS: BMET    Component Value Date/Time   NA 139 06/23/2017 1028   K 4.3 06/23/2017 1028   CL 103 06/23/2017 1028   CO2 26 06/23/2017 1028   GLUCOSE 104 (H) 06/23/2017 1028   BUN 12 06/23/2017 1028   CREATININE 0.83 06/23/2017 1028   CALCIUM 9.2 06/23/2017 1028   GFRNONAA 82 (L) 01/13/2012 0328   GFRAA >90 01/13/2012 0328   Lab Results  Component Value Date   HGBA1C 5.9 06/23/2017   No results found for:  INSULIN CBC    Component Value Date/Time   WBC 5.9 06/23/2017 1028   RBC 5.03 06/23/2017 1028   HGB 14.3 06/23/2017 1028   HCT 43.5 06/23/2017 1028   PLT 253.0 06/23/2017 1028   MCV 86.5 06/23/2017 1028   MCH 28.8 01/13/2012 0328   MCHC 32.9 06/23/2017 1028   RDW 13.8 06/23/2017 1028   LYMPHSABS 1.7 06/23/2017 1028   MONOABS 0.4 06/23/2017 1028   EOSABS 0.1 06/23/2017 1028   BASOSABS 0.1 06/23/2017 1028   Iron/TIBC/Ferritin/ %Sat No results found for: IRON, TIBC, FERRITIN, IRONPCTSAT Lipid Panel     Component Value Date/Time   CHOL 145 06/23/2017 1028   TRIG 131.0 06/23/2017 1028   HDL 45.20 06/23/2017 1028   CHOLHDL 3 06/23/2017 1028   VLDL 26.2 06/23/2017 1028   LDLCALC 74 06/23/2017 1028   LDLDIRECT 136.0 11/16/2016 0902   Hepatic Function Panel     Component Value Date/Time   PROT 6.8 06/23/2017 1028   ALBUMIN 4.3 06/23/2017 1028   AST 16 06/23/2017 1028   ALT 25 06/23/2017 1028   ALKPHOS 46 06/23/2017 1028   BILITOT 0.6 06/23/2017 1028   BILIDIR 0.0 02/26/2012 1353      Component Value Date/Time   TSH 1.31 06/23/2017 1028   TSH 1.61 03/02/2014 0847   TSH 1.37 02/27/2013 1135    ECG  shows NSR with a rate of 77 BPM INDIRECT CALORIMETER done today shows a VO2 of 235 and a REE of 1637.  His calculated basal metabolic rate is 7628 thus his basal metabolic rate is worse than expected.    ASSESSMENT AND PLAN: Other fatigue - Plan: EKG 12-Lead, Vitamin B12, CBC With Differential, Comprehensive metabolic panel, Folate, Hemoglobin A1c, Insulin, random, T3, T4, free, TSH, VITAMIN D 25 Hydroxy (Vit-D Deficiency, Fractures)  Shortness of breath on exertion  Right bundle branch block - Plan: Lipid Panel With LDL/HDL Ratio, ECHOCARDIOGRAM COMPLETE  Blood pressure elevated without history of HTN  Other hyperlipidemia - Plan: Lipid Panel With LDL/HDL Ratio  Depression screening  Class 3 severe obesity with serious comorbidity and body mass index (BMI) of 45.0  to 49.9 in adult,  unspecified obesity type ([place])  PLAN: Fatigue Bueford was informed that his fatigue may be related to obesity, depression or many other causes. Labs will be ordered, and in the meanwhile Kwadwo has agreed to work on diet, exercise and weight loss to help with fatigue. Proper sleep hygiene was discussed including the need for 7-8 hours of quality sleep each night. A sleep study was not ordered based on symptoms and Epworth score.  Dyspnea on exertion Darrick's shortness of breath appears to be obesity related and exercise induced. He has agreed to work on weight loss and gradually increase exercise to treat his exercise induced shortness of breath. If Zalen follows our instructions and loses weight without improvement of his shortness of breath, we will plan to refer to pulmonology. We will monitor this condition regularly. Conal agrees to this plan.  Elevated Blood Pressure without History of Hypertension We discussed sodium restriction, working on healthy weight loss, and a regular exercise program as the means to achieve improved blood pressure control. Kiefer agreed with this plan and agreed to follow up as directed. We will continue to monitor his blood pressure as well as his progress with the above lifestyle modifications. We will check labs and will recheck blood pressure in 2 weeks.  Hyperlipidemia Burr was informed of the American Heart Association Guidelines emphasizing intensive lifestyle modifications as the first line treatment for hyperlipidemia. We discussed many lifestyle modifications today in depth, and Rune will continue to work on decreasing saturated fats such as fatty red meat, butter and many fried foods. He will also increase vegetables and lean protein in his diet and continue to work on exercise and weight loss efforts. We will check labs and Tavish will continue his medications as prescribed.  Right Bundle Branch Block We have referred  Canden to [facility] for echocardiogram Auth# [id number] 09/08/17 to 10/07/17. Jasper agrees to follow up with our clinic in 2 weeks.  Depression Screen Stepen had a moderately positive depression screening. Depression is commonly associated with obesity and often results in emotional eating behaviors. We will monitor this closely and work on CBT to help improve the non-hunger eating patterns. Referral to Psychology may be required if no improvement is seen as he continues in our clinic.  Obesity Aldin is currently in the action stage of change and his goal is to continue with weight loss efforts. I recommend Zian begin the structured treatment plan as follows:  He has agreed to follow the Category 3 plan Kahleel has been instructed to eventually work up to a goal of 150 minutes of combined cardio and strengthening exercise per week for weight loss and overall health benefits. We discussed the following Behavioral Modification Strategies today: decreasing sodium intake, increasing lean protein intake, decreasing simple carbohydrates and work on meal planning and easy cooking plans   He was informed of the importance of frequent follow up visits to maximize his success with intensive lifestyle modifications for his multiple health conditions. He was informed we would discuss his lab results at his next visit unless there is a critical issue that needs to be addressed sooner. Deangelo agreed to keep his next visit at the agreed upon time to discuss these results.  I, Doreene Nest, am acting as transcriptionist for  Dennard Nip, MD  I have reviewed the above documentation for accuracy and completeness, and I agree with the above. -Dennard Nip, MD    OBESITY BEHAVIORAL INTERVENTION VISIT  Today's visit was # 1  out of 22.  Starting weight: 326 lbs Starting date: 09/08/17 Today's weight : @FLOWAMB (14)@  Today's date: 09/08/2017 Total lbs lost to date:  326 lbs (Patients must lose 7 lbs in the first 6 months to continue with counseling)   ASK: We discussed the diagnosis of obesity with [name] today and Delvecchio agreed to give [place] permission to discuss obesity behavioral modification therapy today.  ASSESS: Ariyan has the diagnosis of obesity and his BMI today is 46.78 Alfred is in the action stage of change   ADVISE: Bao was educated on the multiple health risks of obesity as well as the benefit of weight loss to improve his health. He was advised of the need for long term treatment and the importance of lifestyle modifications.  AGREE: Multiple dietary modification options and treatment options were discussed and  King agreed to follow the Category 3 plan We discussed the following Behavioral Modification Strategies today: decreasing sodium intake, increasing lean protein intake, decreasing simple carbohydrates and work on meal planning and easy cooking plans

## 2017-09-09 LAB — COMPREHENSIVE METABOLIC PANEL
A/G RATIO: 1.4 (ref 1.2–2.2)
ALT: 23 IU/L (ref 0–44)
AST: 16 IU/L (ref 0–40)
Albumin: 4.3 g/dL (ref 3.5–5.5)
Alkaline Phosphatase: 49 IU/L (ref 39–117)
BUN/Creatinine Ratio: 19 (ref 9–20)
BUN: 14 mg/dL (ref 6–24)
Bilirubin Total: 0.4 mg/dL (ref 0.0–1.2)
CALCIUM: 9.3 mg/dL (ref 8.7–10.2)
CO2: 23 mmol/L (ref 20–29)
CREATININE: 0.75 mg/dL — AB (ref 0.76–1.27)
Chloride: 101 mmol/L (ref 96–106)
GFR calc Af Amer: 120 mL/min/{1.73_m2} (ref >59)
GFR, EST NON AFRICAN AMERICAN: 104 mL/min/{1.73_m2} (ref >59)
GLUCOSE: 86 mg/dL (ref 65–99)
Globulin, Total: 3 g/dL (ref 1.5–4.5)
POTASSIUM: 4.1 mmol/L (ref 3.5–5.2)
Sodium: 137 mmol/L (ref 134–144)
Total Protein: 7.3 g/dL (ref 6.0–8.5)

## 2017-09-09 LAB — HEMOGLOBIN A1C
ESTIMATED AVERAGE GLUCOSE: 123 mg/dL
HEMOGLOBIN A1C: 5.9 % — AB (ref 4.8–5.6)

## 2017-09-09 LAB — CBC WITH DIFFERENTIAL
BASOS ABS: 0.1 10*3/uL (ref 0.0–0.2)
Basos: 1 %
EOS (ABSOLUTE): 0.2 10*3/uL (ref 0.0–0.4)
Eos: 2 %
Hematocrit: 43.8 % (ref 37.5–51.0)
Hemoglobin: 14.7 g/dL (ref 13.0–17.7)
IMMATURE GRANS (ABS): 0 10*3/uL (ref 0.0–0.1)
IMMATURE GRANULOCYTES: 1 %
LYMPHS: 29 %
Lymphocytes Absolute: 2.2 10*3/uL (ref 0.7–3.1)
MCH: 28.5 pg (ref 26.6–33.0)
MCHC: 33.6 g/dL (ref 31.5–35.7)
MCV: 85 fL (ref 79–97)
MONOS ABS: 0.5 10*3/uL (ref 0.1–0.9)
Monocytes: 6 %
NEUTROS PCT: 61 %
Neutrophils Absolute: 4.8 10*3/uL (ref 1.4–7.0)
RBC: 5.15 x10E6/uL (ref 4.14–5.80)
RDW: 13.8 % (ref 12.3–15.4)
WBC: 7.8 10*3/uL (ref 3.4–10.8)

## 2017-09-09 LAB — LIPID PANEL WITH LDL/HDL RATIO
Cholesterol, Total: 151 mg/dL (ref 100–199)
HDL: 44 mg/dL (ref >39)
LDL CALC: 72 mg/dL (ref 0–99)
LDL/HDL RATIO: 1.6 ratio (ref 0.0–3.6)
Triglycerides: 175 mg/dL — ABNORMAL HIGH (ref 0–149)
VLDL Cholesterol Cal: 35 mg/dL (ref 5–40)

## 2017-09-09 LAB — T3: T3 TOTAL: 117 ng/dL (ref 71–180)

## 2017-09-09 LAB — TSH: TSH: 1.4 u[IU]/mL (ref 0.450–4.500)

## 2017-09-09 LAB — VITAMIN D 25 HYDROXY (VIT D DEFICIENCY, FRACTURES): Vit D, 25-Hydroxy: 29.7 ng/mL — ABNORMAL LOW (ref 30.0–100.0)

## 2017-09-09 LAB — FOLATE: Folate: 18 ng/mL (ref >3.0)

## 2017-09-09 LAB — T4, FREE: FREE T4: 1.43 ng/dL (ref 0.82–1.77)

## 2017-09-09 LAB — VITAMIN B12: VITAMIN B 12: 505 pg/mL (ref 232–1245)

## 2017-09-09 LAB — INSULIN, RANDOM: INSULIN: 21.9 u[IU]/mL (ref 2.6–24.9)

## 2017-09-21 ENCOUNTER — Ambulatory Visit (INDEPENDENT_AMBULATORY_CARE_PROVIDER_SITE_OTHER): Payer: 59 | Admitting: Family Medicine

## 2017-09-21 ENCOUNTER — Other Ambulatory Visit: Payer: Self-pay

## 2017-09-21 ENCOUNTER — Encounter (INDEPENDENT_AMBULATORY_CARE_PROVIDER_SITE_OTHER): Payer: Self-pay | Admitting: Family Medicine

## 2017-09-21 ENCOUNTER — Ambulatory Visit (HOSPITAL_COMMUNITY): Payer: 59 | Attending: Cardiovascular Disease

## 2017-09-21 VITALS — BP 135/82 | HR 66 | Temp 97.8°F | Ht 70.0 in | Wt 318.0 lb

## 2017-09-21 DIAGNOSIS — Z6841 Body Mass Index (BMI) 40.0 and over, adult: Secondary | ICD-10-CM

## 2017-09-21 DIAGNOSIS — E559 Vitamin D deficiency, unspecified: Secondary | ICD-10-CM

## 2017-09-21 DIAGNOSIS — I420 Dilated cardiomyopathy: Secondary | ICD-10-CM | POA: Diagnosis not present

## 2017-09-21 DIAGNOSIS — I5189 Other ill-defined heart diseases: Secondary | ICD-10-CM

## 2017-09-21 DIAGNOSIS — R7303 Prediabetes: Secondary | ICD-10-CM | POA: Diagnosis not present

## 2017-09-21 DIAGNOSIS — I4510 Unspecified right bundle-branch block: Secondary | ICD-10-CM

## 2017-09-21 DIAGNOSIS — I5030 Unspecified diastolic (congestive) heart failure: Secondary | ICD-10-CM | POA: Diagnosis not present

## 2017-09-21 DIAGNOSIS — I519 Heart disease, unspecified: Secondary | ICD-10-CM

## 2017-09-21 MED ORDER — VITAMIN D (ERGOCALCIFEROL) 1.25 MG (50000 UNIT) PO CAPS: 50000.0000 [IU] | ORAL_CAPSULE | ORAL | 0 refills | Status: DC

## 2017-09-21 NOTE — Progress Notes (Signed)
Office: [phone]  /  Fax: [phone]   HPI:   Chief Complaint: OBESITY Bryan [name] is here to discuss his progress with his obesity treatment plan. He is on the Category 3 plan and is following his eating plan approximately 98 % of the time. He states he is exercising 0 minutes 0 times per week. Bryan [name] did well with weight loss on the category 3 plan and he liked breakfast and lunch but struggled with dinner options. His weight is (!) 318 lb (144.2 kg) today and has had a weight loss of 8 pounds over a period of 2 weeks since his last visit. He has lost 8 lbs since starting treatment with [place].  Vitamin D deficiency Bryan [name] has a diagnosis of vitamin D deficiency. He is currently taking OTC vit D in multi vitamin and is not yet at goal. He admits fatigue but and denies nausea, vomiting or muscle weakness.  Pre-Diabetes Bryan [name] has a diagnosis of pre-diabetes based on his elevated Hgb A1c, positive polyphagia and elevated fasting insulin and was informed this puts him at greater risk of developing diabetes. He is not taking metformin currently and continues to work on diet and exercise to decrease risk of diabetes. Bryan [name] is doing well on his diet prescription. He denies nausea or hypoglycemia.  Grade II diastolic dysfunction Bryan [name] has a diagnosis of grade II diastolic dysfunction by echocardiogram with high ejection fraction of 65 to 70%. Bryan [name] has mild lower extremity edema, positive sleep apnea and intermittently elevated blood pressure (good today).  ALLERGIES: No Known Allergies  MEDICATIONS: Current Outpatient Prescriptions on File Prior to Visit  Medication Sig Dispense Refill  . aspirin 81 MG tablet Take 81 mg by mouth daily.    [name] atorvastatin (LIPITOR) 20 MG tablet Take 1 tablet (20 mg total) by mouth at bedtime. 90 tablet 3  . azelastine (ASTELIN) 0.1 % nasal spray Place 2 sprays into both nostrils at bedtime as needed for rhinitis. Use in each nostril as directed    .  CO ENZYME Q-10 PO Take by mouth daily.    . fish oil-omega-3 fatty acids 1000 MG capsule Take 1 g by mouth daily.    Javier Docker Oil 500 MG CAPS Take 500 mg by mouth daily.    . Melatonin 1 MG CAPS Take 1 mg by mouth at bedtime as needed.     . Multiple Vitamins-Minerals (CENTRUM PO) Take 1 tablet by mouth daily.    . Turmeric 500 MG CAPS Take 500 mg by mouth daily.    Cristino Martes Root 450 MG CAPS Take 450 mg by mouth at bedtime as needed.      No current facility-administered medications on file prior to visit.     PAST MEDICAL HISTORY: Past Medical History:  Diagnosis Date  . Dental crowns present   . Gallbladder problem   . Hyperlipidemia   . Lower back pain   . Nephrolithiasis 2017  . Prediabetes 09/04/2014  . Seasonal allergies   . Sebaceous cyst 04/2013   upper back  . Sleep apnea    uses CPAP nightly  . Urolithiasis 10/2016    PAST SURGICAL HISTORY: Past Surgical History:  Procedure Laterality Date  . ADENOIDECTOMY     as a child  . CHOLECYSTECTOMY  01/12/2012   Procedure: LAPAROSCOPIC CHOLECYSTECTOMY WITH INTRAOPERATIVE CHOLANGIOGRAM;  Surgeon: [name], MD;  Location: [facility];  Service: General;  Laterality: N/A;  . CYST EXCISION     cyst removal from back  .  EAR CYST EXCISION N/A 05/04/2013   Procedure: Excision subcutaneous mass posterior neck;  Surgeon: Imogene Burn. [name], MD;  Location: [facility];  Service: General;  Laterality: N/A;    SOCIAL HISTORY: Social History  Substance Use Topics  . Smoking status: Never Smoker  . Smokeless tobacco: Never Used  . Alcohol use Yes     Comment: 1 glass wine every other day    FAMILY HISTORY: Family History  Problem Relation Age of Onset  . Cancer Mother        ovarian  . Hypertension Mother   . Hyperlipidemia Mother   . Obesity Mother   . Cancer Brother        leukemia  . Diabetes Brother   . Colon cancer Maternal Aunt 71  . Cancer Paternal Grandmother        breast  . Cancer Father 35        renal  . Kidney disease Father   . Sleep apnea Father   . Prostate cancer Neg Hx   . Esophageal cancer Neg Hx   . Rectal cancer Neg Hx   . Stomach cancer Neg Hx   . CAD Neg Hx     ROS: Review of Systems  Constitutional: Positive for malaise/fatigue and weight loss.  Gastrointestinal: Negative for nausea and vomiting.  Musculoskeletal:       Negative muscle weakness  Endo/Heme/Allergies:       Positive polyphagia Negative hypoglycemia    PHYSICAL EXAM: Blood pressure 135/82, pulse 66, temperature 97.8 F (36.6 C), temperature source Oral, height 5\' 10"  (1.778 m), weight (!) 318 lb (144.2 kg), SpO2 96 %. Body mass index is 45.63 kg/m. Physical Exam  Constitutional: He is oriented to person, place, and time. He appears well-developed and well-nourished.  Pulmonary/Chest: Effort normal.  Musculoskeletal: Normal range of motion.  Neurological: He is oriented to person, place, and time.  Skin: Skin is warm and dry.  Psychiatric: He has a normal mood and affect. His behavior is normal.  Vitals reviewed.   RECENT LABS AND TESTS: BMET    Component Value Date/Time   NA 137 09/08/2017 1144   K 4.1 09/08/2017 1144   CL 101 09/08/2017 1144   CO2 23 09/08/2017 1144   GLUCOSE 86 09/08/2017 1144   GLUCOSE 104 (H) 06/23/2017 1028   BUN 14 09/08/2017 1144   CREATININE 0.75 (L) 09/08/2017 1144   CALCIUM 9.3 09/08/2017 1144   GFRNONAA 104 09/08/2017 1144   GFRAA 120 09/08/2017 1144   Lab Results  Component Value Date   HGBA1C 5.9 (H) 09/08/2017   HGBA1C 5.9 06/23/2017   HGBA1C 6.0 11/16/2016   HGBA1C 6.0 09/04/2014   HGBA1C 6.1 03/02/2014   Lab Results  Component Value Date   INSULIN 21.9 09/08/2017   CBC    Component Value Date/Time   WBC 7.8 09/08/2017 1144   WBC 5.9 06/23/2017 1028   RBC 5.15 09/08/2017 1144   RBC 5.03 06/23/2017 1028   HGB 14.7 09/08/2017 1144   HCT 43.8 09/08/2017 1144   PLT 253.0 06/23/2017 1028   MCV 85 09/08/2017 1144   MCH 28.5  09/08/2017 1144   MCH 28.8 01/13/2012 0328   MCHC 33.6 09/08/2017 1144   MCHC 32.9 06/23/2017 1028   RDW 13.8 09/08/2017 1144   LYMPHSABS 2.2 09/08/2017 1144   MONOABS 0.4 06/23/2017 1028   EOSABS 0.2 09/08/2017 1144   BASOSABS 0.1 09/08/2017 1144   Iron/TIBC/Ferritin/ %Sat No results found for: IRON, TIBC, FERRITIN,  IRONPCTSAT Lipid Panel     Component Value Date/Time   CHOL 151 09/08/2017 1144   TRIG 175 (H) 09/08/2017 1144   HDL 44 09/08/2017 1144   CHOLHDL 3 06/23/2017 1028   VLDL 26.2 06/23/2017 1028   LDLCALC 72 09/08/2017 1144   LDLDIRECT 136.0 11/16/2016 0902   Hepatic Function Panel     Component Value Date/Time   PROT 7.3 09/08/2017 1144   ALBUMIN 4.3 09/08/2017 1144   AST 16 09/08/2017 1144   ALT 23 09/08/2017 1144   ALKPHOS 49 09/08/2017 1144   BILITOT 0.4 09/08/2017 1144   BILIDIR 0.0 02/26/2012 1353      Component Value Date/Time   TSH 1.400 09/08/2017 1144   TSH 1.31 06/23/2017 1028   TSH 1.61 03/02/2014 0847    ASSESSMENT AND PLAN: Vitamin D deficiency - Plan: Vitamin D, Ergocalciferol, (DRISDOL) 50000 units CAPS capsule  Prediabetes  Grade II diastolic dysfunction  Class 3 severe obesity with serious comorbidity and body mass index (BMI) of 45.0 to 49.9 in adult, unspecified obesity type ([place])  PLAN:  Vitamin D Deficiency Bryan [name] was informed that low vitamin D levels contributes to fatigue and are associated with obesity, breast, and colon cancer. He agrees to start to take prescription Vit D @50 ,000 IU every week #4 with no refills and will follow up for routine testing of vitamin D, at least 2-3 times per year. He was informed of the risk of over-replacement of vitamin D and agrees to not increase his dose unless he discusses this with [place] first. Bright agrees to follow up with our clinic in 2 weeks.  Pre-Diabetes Bryan [name] will continue to work on weight loss, exercise, and decreasing simple carbohydrates in his diet to help decrease the risk  of diabetes. We dicussed metformin including benefits and risks. He was informed that eating too many simple carbohydrates or too many calories at one sitting increases the likelihood of GI side effects. Bryan [name] agreed to start metformin for now and a prescription was written today for metformin 500 mg qam #30 with no refills. Bryan [name] agreed to follow up with [place] as directed to monitor his progress.  Grade II diastolic dysfunction Bryan [name] was educated on the results and the relationship to hypertension, sleep apnea and obesity in depth. We will continue to monitor closely and my need to start HCTZ if blood pressure continues to be elevated. Bryan [name] will continue to work on diet, exercise and weight loss efforts.  Obesity Bryan [name] is currently in the action stage of change. As such, his goal is to continue with weight loss efforts He has agreed to follow the Category 3 plan Bryan [name] has been instructed to work up to a goal of 150 minutes of combined cardio and strengthening exercise per week for weight loss and overall health benefits. We discussed the following Behavioral Modification Strategies today: increasing lean protein intake, decreasing simple carbohydrates and work on meal planning and easy cooking plans  Bryan [name] has agreed to follow up with our clinic in 2 weeks. He was informed of the importance of frequent follow up visits to maximize his success with intensive lifestyle modifications for his multiple health conditions.  I, Trixie Dredge, am acting as transcriptionist for Dennard Nip, MD  I have reviewed the above documentation for accuracy and completeness, and I agree with the above. -Dennard Nip, MD   OBESITY BEHAVIORAL INTERVENTION VISIT  Today's visit was # 2 out of 20.  Starting weight: 326 lbs Starting date: 09/08/17 Today's weight : 318  lbs Today's date: 09/21/2017 Total lbs lost to date: 8 (Patients must lose 7 lbs in the first 6 months to continue with  counseling)   ASK: We discussed the diagnosis of obesity with [name] today and Jakarie agreed to give [place] permission to discuss obesity behavioral modification therapy today.  ASSESS: Olyver has the diagnosis of obesity and his BMI today is 45.63 Orlie is in the action stage of change   ADVISE: Cali was educated on the multiple health risks of obesity as well as the benefit of weight loss to improve his health. He was advised of the need for long term treatment and the importance of lifestyle modifications.  AGREE: Multiple dietary modification options and treatment options were discussed and  Zyeir agreed to follow the Category 3 plan We discussed the following Behavioral Modification Strategies today: increasing lean protein intake, decreasing simple carbohydrates and work on meal planning and easy cooking plans

## 2017-09-22 ENCOUNTER — Other Ambulatory Visit (INDEPENDENT_AMBULATORY_CARE_PROVIDER_SITE_OTHER): Payer: Self-pay

## 2017-09-22 MED ORDER — METFORMIN HCL 500 MG PO TABS: 500.0000 mg | ORAL_TABLET | Freq: Every day | ORAL | 0 refills | Status: DC

## 2017-09-22 NOTE — Telephone Encounter (Signed)
Can you add Metformin 500 qday x 1 month and get his chart for me to addend? THX

## 2017-10-06 ENCOUNTER — Ambulatory Visit (INDEPENDENT_AMBULATORY_CARE_PROVIDER_SITE_OTHER): Payer: 59 | Admitting: Family Medicine

## 2017-10-06 VITALS — BP 119/80 | HR 74 | Temp 98.4°F | Ht 70.0 in | Wt 311.0 lb

## 2017-10-06 DIAGNOSIS — Z6841 Body Mass Index (BMI) 40.0 and over, adult: Secondary | ICD-10-CM

## 2017-10-06 DIAGNOSIS — R7303 Prediabetes: Secondary | ICD-10-CM | POA: Diagnosis not present

## 2017-10-06 NOTE — Progress Notes (Signed)
Office: [phone]  /  Fax: [phone]   HPI:   Chief Complaint: OBESITY Bryan [name] is here to discuss his progress with his obesity treatment plan. He is on the Category 3 plan and is following his eating plan approximately 98 % of the time. He states he is walking for 30 minutes 2 times per week. Bryan [name] continues to do well with weight loss on the Category 3 plan. He is using his snacks in the evenings.  His weight is (!) 311 lb (141.1 kg) today and has had a weight loss of 7 pounds over a period of 2 weeks since his last visit. He has lost 15 lbs since starting treatment with [place].  Pre-Diabetes Bryan [name] has a diagnosis of pre-diabetes based on his elevated Hgb A1c and was informed this puts him at greater risk of developing diabetes. He has started metformin with no GI upset. His polyphagia has decreased and he is doing well on diet prescription and continues to work on diet and exercise to decrease risk of diabetes. He denies nausea or hypoglycemia.  ALLERGIES: No Known Allergies  MEDICATIONS: Current Outpatient Prescriptions on File Prior to Visit  Medication Sig Dispense Refill  . aspirin 81 MG tablet Take 81 mg by mouth daily.    [name] atorvastatin (LIPITOR) 20 MG tablet Take 1 tablet (20 mg total) by mouth at bedtime. 90 tablet 3  . azelastine (ASTELIN) 0.1 % nasal spray Place 2 sprays into both nostrils at bedtime as needed for rhinitis. Use in each nostril as directed    . CO ENZYME Q-10 PO Take by mouth daily.    . fish [name]-omega-3 fatty acids 1000 MG capsule Take 1 g by mouth daily.    Bryan [name] 500 MG CAPS Take 500 mg by mouth daily.    . Melatonin 1 MG CAPS Take 1 mg by mouth at bedtime as needed.     . metFORMIN (GLUCOPHAGE) 500 MG tablet Take 1 tablet (500 mg total) by mouth daily with breakfast. 30 tablet 0  . Multiple Vitamins-Minerals (CENTRUM PO) Take 1 tablet by mouth daily.    . Turmeric 500 MG CAPS Take 500 mg by mouth daily.    Bryan [name] 450 MG CAPS Take  450 mg by mouth at bedtime as needed.     . Vitamin D, Ergocalciferol, (DRISDOL) 50000 units CAPS capsule Take 1 capsule (50,000 Units total) by mouth every 7 (seven) days. 4 capsule 0   No current facility-administered medications on file prior to visit.     PAST MEDICAL HISTORY: Past Medical History:  Diagnosis Date  . Dental crowns present   . Gallbladder problem   . Hyperlipidemia   . Lower back pain   . Nephrolithiasis 2017  . Prediabetes 09/04/2014  . Seasonal allergies   . Sebaceous cyst 04/2013   upper back  . Sleep apnea    uses CPAP nightly  . Urolithiasis 10/2016    PAST SURGICAL HISTORY: Past Surgical History:  Procedure Laterality Date  . ADENOIDECTOMY     as a child  . CHOLECYSTECTOMY  01/12/2012   Procedure: LAPAROSCOPIC CHOLECYSTECTOMY WITH INTRAOPERATIVE CHOLANGIOGRAM;  Surgeon: [name], MD;  Location: [facility];  Service: General;  Laterality: N/A;  . CYST EXCISION     cyst removal from back  . EAR CYST EXCISION N/A 05/04/2013   Procedure: Excision subcutaneous mass posterior neck;  Surgeon: Imogene Burn. [name], MD;  Location: [facility];  Service: General;  Laterality: N/A;  SOCIAL HISTORY: Social History  Substance Use Topics  . Smoking status: Never Smoker  . Smokeless tobacco: Never Used  . Alcohol use Yes     Comment: 1 glass wine every other day    FAMILY HISTORY: Family History  Problem Relation Age of Onset  . Cancer Mother        ovarian  . Hypertension Mother   . Hyperlipidemia Mother   . Obesity Mother   . Cancer Brother        leukemia  . Diabetes Brother   . Colon cancer Maternal Aunt 72  . Cancer Paternal Grandmother        breast  . Cancer Father 4       renal  . Kidney disease Father   . Sleep apnea Father   . Prostate cancer Neg Hx   . Esophageal cancer Neg Hx   . Rectal cancer Neg Hx   . Stomach cancer Neg Hx   . CAD Neg Hx     ROS: Review of Systems  Constitutional: Positive for weight loss.    Gastrointestinal: Negative for nausea.  Endo/Heme/Allergies:       Positive polyphagia Negative hypoglycemia    PHYSICAL EXAM: Blood pressure 119/80, pulse 74, temperature 98.4 F (36.9 C), temperature source Oral, height 5\' 10"  (1.778 m), weight (!) 311 lb (141.1 kg), SpO2 98 %. Body mass index is 44.62 kg/m. Physical Exam  Constitutional: He is oriented to person, place, and time. He appears well-developed and well-nourished.  Cardiovascular: Normal rate.   Pulmonary/Chest: Effort normal.  Musculoskeletal: Normal range of motion.  Neurological: He is oriented to person, place, and time.  Skin: Skin is warm and dry.  Psychiatric: He has a normal mood and affect. His behavior is normal.  Vitals reviewed.   RECENT LABS AND TESTS: BMET    Component Value Date/Time   NA 137 09/08/2017 1144   K 4.1 09/08/2017 1144   CL 101 09/08/2017 1144   CO2 23 09/08/2017 1144   GLUCOSE 86 09/08/2017 1144   GLUCOSE 104 (H) 06/23/2017 1028   BUN 14 09/08/2017 1144   CREATININE 0.75 (L) 09/08/2017 1144   CALCIUM 9.3 09/08/2017 1144   GFRNONAA 104 09/08/2017 1144   GFRAA 120 09/08/2017 1144   Lab Results  Component Value Date   HGBA1C 5.9 (H) 09/08/2017   HGBA1C 5.9 06/23/2017   HGBA1C 6.0 11/16/2016   HGBA1C 6.0 09/04/2014   HGBA1C 6.1 03/02/2014   Lab Results  Component Value Date   INSULIN 21.9 09/08/2017   CBC    Component Value Date/Time   WBC 7.8 09/08/2017 1144   WBC 5.9 06/23/2017 1028   RBC 5.15 09/08/2017 1144   RBC 5.03 06/23/2017 1028   HGB 14.7 09/08/2017 1144   HCT 43.8 09/08/2017 1144   PLT 253.0 06/23/2017 1028   MCV 85 09/08/2017 1144   MCH 28.5 09/08/2017 1144   MCH 28.8 01/13/2012 0328   MCHC 33.6 09/08/2017 1144   MCHC 32.9 06/23/2017 1028   RDW 13.8 09/08/2017 1144   LYMPHSABS 2.2 09/08/2017 1144   MONOABS 0.4 06/23/2017 1028   EOSABS 0.2 09/08/2017 1144   BASOSABS 0.1 09/08/2017 1144   Iron/TIBC/Ferritin/ %Sat No results found for: IRON,  TIBC, FERRITIN, IRONPCTSAT Lipid Panel     Component Value Date/Time   CHOL 151 09/08/2017 1144   TRIG 175 (H) 09/08/2017 1144   HDL 44 09/08/2017 1144   CHOLHDL 3 06/23/2017 1028   VLDL 26.2 06/23/2017 1028  LDLCALC 72 09/08/2017 1144   LDLDIRECT 136.0 11/16/2016 0902   Hepatic Function Panel     Component Value Date/Time   PROT 7.3 09/08/2017 1144   ALBUMIN 4.3 09/08/2017 1144   AST 16 09/08/2017 1144   ALT 23 09/08/2017 1144   ALKPHOS 49 09/08/2017 1144   BILITOT 0.4 09/08/2017 1144   BILIDIR 0.0 02/26/2012 1353      Component Value Date/Time   TSH 1.400 09/08/2017 1144   TSH 1.31 06/23/2017 1028   TSH 1.61 03/02/2014 0847    ASSESSMENT AND PLAN: Prediabetes  Class 3 severe obesity with serious comorbidity and body mass index (BMI) of 40.0 to 44.9 in adult, unspecified obesity type ([place])  PLAN:  Pre-Diabetes Bryan [name] will continue to work on weight loss, diet, exercise, and decreasing simple carbohydrates in his diet to help decrease the risk of diabetes. We dicussed metformin including benefits and risks. He was informed that eating too many simple carbohydrates or too many calories at one sitting increases the likelihood of GI side effects. Bryan [name] will continue taking metformin as prescribed and we will recheck labs in 2 weeks. Bryan [name] agrees to follow up with our clinic in 2 weeks with Lacy Duverney, PA-C and follow up in 4 weeks with Dennard Nip, MD as directed to monitor his progress.  We spent > than 50% of the 15 minute visit on the counseling as documented in the note.  Obesity Bryan [name] is currently in the action stage of change. As such, his goal is to continue with weight loss efforts He has agreed to keep a food journal with 400-700 calories and 40 grams of protein  Bryan [name] has been instructed to work up to a goal of 150 minutes of combined cardio and strengthening exercise per week for weight loss and overall health benefits. We discussed the following  Behavioral Modification Strategies today: increasing lean protein intake, decreasing simple carbohydrates, work on meal planning and easy cooking plans, dealing with family or coworker sabotage, holiday eating strategies, keeping healthy foods in the home, and planning for success    Bryan [name] has agreed to follow up with our clinic in 2 weeks with Lacy Duverney, PA-C and follow up in 4 weeks with Dennard Nip, MD. He was informed of the importance of frequent follow up visits to maximize his success with intensive lifestyle modifications for his multiple health conditions.  I, Trixie Dredge, am acting as transcriptionist for Dennard Nip, MD  I have reviewed the above documentation for accuracy and completeness, and I agree with the above. -Dennard Nip, MD       Today's visit was # 3 out of 22.  Starting weight: 326 lbs Starting date: 09/08/17 Today's weight : 311 lbs  Today's date: 10/06/2017 Total lbs lost to date: 15 (Patients must lose 7 lbs in the first 6 months to continue with counseling)   ASK: We discussed the diagnosis of obesity with [name] today and Gurinder agreed to give [place] permission to discuss obesity behavioral modification therapy today.  ASSESS: Bryan [name] has the diagnosis of obesity and his BMI today is 44.62 Bryan [name] is in the action stage of change   ADVISE: Bryan [name] was educated on the multiple health risks of obesity as well as the benefit of weight loss to improve his health. He was advised of the need for long term treatment and the importance of lifestyle modifications.  AGREE: Multiple dietary modification options and treatment options were discussed and  Bryan [name] agreed to keep a food journal  with 400-700 calories and 40 grams of protein  We discussed the following Behavioral Modification Strategies today: increasing lean protein intake, decreasing simple carbohydrates, work on meal planning and easy cooking plans, dealing with family or coworker  sabotage, holiday eating strategies, keeping healthy foods in the home, and planning for success

## 2017-10-20 ENCOUNTER — Ambulatory Visit (INDEPENDENT_AMBULATORY_CARE_PROVIDER_SITE_OTHER): Payer: BC Managed Care – PPO | Admitting: Physician Assistant

## 2017-10-20 VITALS — BP 147/77 | HR 72 | Temp 98.3°F | Ht 70.0 in | Wt 314.0 lb

## 2017-10-20 DIAGNOSIS — R7303 Prediabetes: Secondary | ICD-10-CM | POA: Diagnosis not present

## 2017-10-20 DIAGNOSIS — E559 Vitamin D deficiency, unspecified: Secondary | ICD-10-CM

## 2017-10-20 DIAGNOSIS — Z9189 Other specified personal risk factors, not elsewhere classified: Secondary | ICD-10-CM

## 2017-10-20 DIAGNOSIS — Z6841 Body Mass Index (BMI) 40.0 and over, adult: Secondary | ICD-10-CM

## 2017-10-20 MED ORDER — METFORMIN HCL 500 MG PO TABS: 500.0000 mg | ORAL_TABLET | Freq: Two times a day (BID) | ORAL | 0 refills | Status: DC

## 2017-10-20 MED ORDER — VITAMIN D (ERGOCALCIFEROL) 1.25 MG (50000 UNIT) PO CAPS: 50000.0000 [IU] | ORAL_CAPSULE | ORAL | 0 refills | Status: DC

## 2017-10-20 NOTE — Progress Notes (Signed)
Office: [phone]  /  Fax: [phone]   HPI:   Chief Complaint: OBESITY Bryan [name] is here to discuss his progress with his obesity treatment plan. He is on the Category 3 plan and is following his eating plan approximately 90 % of the time. He states he is exercising elliptical and walking for 45 minutes 3 times per week. Bryan [name] had increased celebration eating. He has noticed increased hunger in the evening. Bryan [name] would like more meal planning ideas. His weight is (!) 314 lb (142.4 kg) today and has had a weight gain of 3 pounds over a period of 2 weeks since his last visit. He has lost 12 lbs since starting treatment with [place].  Vitamin D deficiency Bryan [name] has a diagnosis of vitamin D deficiency. He is currently taking vit D and denies nausea, vomiting or muscle weakness.  Pre-Diabetes Bryan [name] has a diagnosis of pre-diabetes based on his elevated Hgb A1c and was informed this puts him at greater risk of developing diabetes. He is taking metformin currently and continues to work on diet and exercise to decrease risk of diabetes. He admits polyphagia and denies nausea or hypoglycemia.  At risk for diabetes Bryan [name] is at higher than average risk for developing diabetes due to his obesity and pre-diabetes. He currently denies polyuria or polydipsia.  Hypertension Bryan [name] is a 56 y.o. male with hypertension, confirmed with elevated Bp on multiple visits. Bryan [name] is not on any HTN meds, and he declines starting any meds today, stating his BP has "been lower" in the past. His BP is currently not controlled. He denies chest pain or shortness of breath on exertion. He is working weight loss to help control his blood pressure with the goal of decreasing his risk of heart attack and stroke.   ALLERGIES: No Known Allergies  MEDICATIONS: Current Outpatient Medications on File Prior to Visit  Medication Sig Dispense Refill  . aspirin 81 MG tablet Take 81 mg by mouth daily.     [name] atorvastatin (LIPITOR) 20 MG tablet Take 1 tablet (20 mg total) by mouth at bedtime. 90 tablet 3  . azelastine (ASTELIN) 0.1 % nasal spray Place 2 sprays into both nostrils at bedtime as needed for rhinitis. Use in each nostril as directed    . CO ENZYME Q-10 PO Take by mouth daily.    . fish [name]-omega-3 fatty acids 1000 MG capsule Take 1 g by mouth daily.    Bryan [name] 500 MG [name] Take 500 mg by mouth daily.    . Melatonin 1 MG [name] Take 1 mg by mouth at bedtime as needed.     . metFORMIN (GLUCOPHAGE) 500 MG tablet Take 1 tablet (500 mg total) by mouth daily with breakfast. 30 tablet 0  . Multiple Vitamins-Minerals (CENTRUM PO) Take 1 tablet by mouth daily.    . Turmeric 500 MG [name] Take 500 mg by mouth daily.    Bryan [name] Take 450 mg by mouth at bedtime as needed.     . Vitamin D, Ergocalciferol, (DRISDOL) 50000 units [name] capsule Take 1 capsule (50,000 Units total) by mouth every 7 (seven) days. 4 capsule 0   No current facility-administered medications on file prior to visit.     PAST MEDICAL HISTORY: Past Medical History:  Diagnosis Date  . Dental crowns present   . Gallbladder problem   . Hyperlipidemia   . Lower back pain   . Nephrolithiasis 2017  . Prediabetes 09/04/2014  .  Seasonal allergies   . Sebaceous cyst 04/2013   upper back  . Sleep apnea    uses CPAP nightly  . Urolithiasis 10/2016    PAST SURGICAL HISTORY: Past Surgical History:  Procedure Laterality Date  . ADENOIDECTOMY     as a child  . CYST EXCISION     cyst removal from back    SOCIAL HISTORY: Social History   Tobacco Use  . Smoking status: Never Smoker  . Smokeless tobacco: Never Used  Substance Use Topics  . Alcohol use: Yes    Comment: 1 glass wine every other day  . Drug use: No    FAMILY HISTORY: Family History  Problem Relation Age of Onset  . Cancer Mother        ovarian  . Hypertension Mother   . Hyperlipidemia Mother   . Obesity Mother   . Cancer  Brother        leukemia  . Diabetes Brother   . Colon cancer Maternal Aunt 15  . Cancer Paternal Grandmother        breast  . Cancer Father 91       renal  . Kidney disease Father   . Sleep apnea Father   . Prostate cancer Neg Hx   . Esophageal cancer Neg Hx   . Rectal cancer Neg Hx   . Stomach cancer Neg Hx   . CAD Neg Hx     ROS: Review of Systems  Constitutional: Negative for weight loss.  Gastrointestinal: Negative for nausea and vomiting.  Genitourinary: Negative for frequency.  Musculoskeletal:       Negative muscle weakness  Endo/Heme/Allergies: Negative for polydipsia.       Positive polyphagia Negative hypoglycemia    PHYSICAL EXAM: Blood pressure (!) 147/77, pulse 72, temperature 98.3 F (36.8 C), temperature source Oral, height 5\' 10"  (1.778 m), weight (!) 314 lb (142.4 kg), SpO2 97 %. Body mass index is 45.05 kg/m. Physical Exam  Constitutional: He is oriented to person, place, and time. He appears well-developed and well-nourished.  Cardiovascular: Normal rate.  Pulmonary/Chest: Effort normal.  Musculoskeletal: Normal range of motion.  Neurological: He is oriented to person, place, and time.  Skin: Skin is warm and dry.  Psychiatric: He has a normal mood and affect. His behavior is normal.  Vitals reviewed.   RECENT LABS AND TESTS: BMET    Component Value Date/Time   NA 137 09/08/2017 1144   K 4.1 09/08/2017 1144   CL 101 09/08/2017 1144   CO2 23 09/08/2017 1144   GLUCOSE 86 09/08/2017 1144   GLUCOSE 104 (H) 06/23/2017 1028   BUN 14 09/08/2017 1144   CREATININE 0.75 (L) 09/08/2017 1144   CALCIUM 9.3 09/08/2017 1144   GFRNONAA 104 09/08/2017 1144   GFRAA 120 09/08/2017 1144   Lab Results  Component Value Date   HGBA1C 5.9 (H) 09/08/2017   HGBA1C 5.9 06/23/2017   HGBA1C 6.0 11/16/2016   HGBA1C 6.0 09/04/2014   HGBA1C 6.1 03/02/2014   Lab Results  Component Value Date   INSULIN 21.9 09/08/2017   CBC    Component Value Date/Time    WBC 7.8 09/08/2017 1144   WBC 5.9 06/23/2017 1028   RBC 5.15 09/08/2017 1144   RBC 5.03 06/23/2017 1028   HGB 14.7 09/08/2017 1144   HCT 43.8 09/08/2017 1144   PLT 253.0 06/23/2017 1028   MCV 85 09/08/2017 1144   MCH 28.5 09/08/2017 1144   MCH 28.8 01/13/2012 0328   MCHC 33.6  09/08/2017 1144   MCHC 32.9 06/23/2017 1028   RDW 13.8 09/08/2017 1144   LYMPHSABS 2.2 09/08/2017 1144   MONOABS 0.4 06/23/2017 1028   EOSABS 0.2 09/08/2017 1144   BASOSABS 0.1 09/08/2017 1144   Iron/TIBC/Ferritin/ %Sat No results found for: IRON, TIBC, FERRITIN, IRONPCTSAT Lipid Panel     Component Value Date/Time   CHOL 151 09/08/2017 1144   TRIG 175 (H) 09/08/2017 1144   HDL 44 09/08/2017 1144   CHOLHDL 3 06/23/2017 1028   VLDL 26.2 06/23/2017 1028   LDLCALC 72 09/08/2017 1144   LDLDIRECT 136.0 11/16/2016 0902   Hepatic Function Panel     Component Value Date/Time   PROT 7.3 09/08/2017 1144   ALBUMIN 4.3 09/08/2017 1144   AST 16 09/08/2017 1144   ALT 23 09/08/2017 1144   ALKPHOS 49 09/08/2017 1144   BILITOT 0.4 09/08/2017 1144   BILIDIR 0.0 02/26/2012 1353      Component Value Date/Time   TSH 1.400 09/08/2017 1144   TSH 1.31 06/23/2017 1028   TSH 1.61 03/02/2014 0847    ASSESSMENT AND PLAN: Prediabetes - Plan: metFORMIN (GLUCOPHAGE) 500 MG tablet  Vitamin D deficiency - Plan: Vitamin D, Ergocalciferol, (DRISDOL) 50000 units [name] capsule  At risk for diabetes mellitus  Class 3 severe obesity with serious comorbidity and body mass index (BMI) of 45.0 to 49.9 in adult, unspecified obesity type (Bryan [name])  PLAN:  Vitamin D Deficiency Bryan [name] was informed that low vitamin D levels contributes to fatigue and are associated with obesity, breast, and colon cancer. He agrees to continue to take prescription Vit D @50 ,000 IU every week #4 with no refills and will follow up for routine testing of vitamin D, at least 2-3 times per year. He was informed of the risk of over-replacement of vitamin D  and agrees to not increase his dose unless he discusses this with [place] first. Anyelo agrees to follow up with our clinic in 2 weeks.  Pre-Diabetes Bryan [name] will continue to work on weight loss, exercise, and decreasing simple carbohydrates in his diet to help decrease the risk of diabetes. We dicussed metformin including benefits and risks. He was informed that eating too many simple carbohydrates or too many calories at one sitting increases the likelihood of GI side effects. Bryan [name] agreed to increase metformin to 500 mg bid #60 with no refills. Bryan [name] agreed to follow up with [place] as directed to monitor his progress.  Hypertension We discussed sodium restriction, working on healthy weight loss, and a regular exercise program as the means to achieve improved blood pressure control. Bryan [name] agreed with this plan and agreed to follow up as directed. We will continue to monitor his blood pressure as well as his progress with the above lifestyle modifications. He understands the risk associated with his HTN given his HLD and obesity and continues to decline any meds today.   Diabetes risk counseling Bryan [name] was given extended (15 minutes) diabetes prevention counseling today. He is 56 y.o. male and has risk factors for diabetes including obesity and pre-diabetes. We discussed intensive lifestyle modifications today with an emphasis on weight loss as well as increasing exercise and decreasing simple carbohydrates in his diet.   Obesity Bryan [name] is currently in the action stage of change. As such, his goal is to continue with weight loss efforts He has agreed to follow the Category 3 plan Bryan [name] has been instructed to work up to a goal of 150 minutes of combined cardio and strengthening exercise per week for weight  loss and overall health benefits. We discussed the following Behavioral Modification Strategies today: increasing lean protein intake and work on meal planning and easy cooking plans  Bryan [name]  has agreed to follow up with our clinic in 2 weeks. He was informed of the importance of frequent follow up visits to maximize his success with intensive lifestyle modifications for his multiple health conditions.  I, Bryan [name], am acting as transcriptionist for Bryan Duverney, PA-C  I have reviewed the above documentation for accuracy and completeness, and I agree with the above. -Bryan Duverney, PA-C  I have reviewed the above note and agree with the plan. -Bryan Nip, MD   OBESITY BEHAVIORAL INTERVENTION VISIT  Today's visit was # 4 out of 22.  Starting weight: 326 lbs Starting date: 09/08/17 Today's weight : 314 lbs  Today's date: 10/20/2017 Total lbs lost to date: 12 (Patients must lose 7 lbs in the first 6 months to continue with counseling)   ASK: We discussed the diagnosis of obesity with [name] today and Bobby agreed to give [place] permission to discuss obesity behavioral modification therapy today.  ASSESS: Vladimir has the diagnosis of obesity and his BMI today is 45.05 Dolph is in the action stage of change   ADVISE: Caid was educated on the multiple health risks of obesity as well as the benefit of weight loss to improve his health. He was advised of the need for long term treatment and the importance of lifestyle modifications.  AGREE: Multiple dietary modification options and treatment options were discussed and  Abdikadir agreed to follow the Category 3 plan We discussed the following Behavioral Modification Strategies today: increasing lean protein intake and work on meal planning and easy cooking plans

## 2017-11-03 ENCOUNTER — Ambulatory Visit (INDEPENDENT_AMBULATORY_CARE_PROVIDER_SITE_OTHER): Payer: BC Managed Care – PPO | Admitting: Family Medicine

## 2017-11-03 VITALS — BP 139/76 | HR 69 | Temp 98.0°F | Ht 70.0 in | Wt 312.0 lb

## 2017-11-03 DIAGNOSIS — R030 Elevated blood-pressure reading, without diagnosis of hypertension: Secondary | ICD-10-CM | POA: Diagnosis not present

## 2017-11-03 DIAGNOSIS — Z6841 Body Mass Index (BMI) 40.0 and over, adult: Secondary | ICD-10-CM | POA: Diagnosis not present

## 2017-11-03 NOTE — Progress Notes (Signed)
Office: [phone]  /  Fax: [phone]   HPI:   Chief Complaint: OBESITY Bryan [name] is here to discuss his progress with his obesity treatment plan. He is on the Category 3 plan and is following his eating plan approximately 85 % of the time. He states he is walking for 45 minutes 2 times per week. Carrick did well with weight loss even over Thanksgiving using our portion control strategies and not eating until overfull.  His weight is (!) 312 lb (141.5 kg) today and has had a weight loss of 2 pounds over a period of 2 weeks since his last visit. He has lost 14 lbs since starting treatment with [place].  Pre-Hypertension [name] is a 56 y.o. male with pre-hypertension. Vonnie's blood pressure has improved from last visit but still in pre-hypertension range. He denies lightheadedness, dizziness, or headache. He is working weight loss to help control his blood pressure with the goal of decreasing his risk of heart attack and stroke. Ranson's blood pressure is not currently controlled.  ALLERGIES: No Known Allergies  MEDICATIONS: Current Outpatient Medications on File Prior to Visit  Medication Sig Dispense Refill  . aspirin 81 MG tablet Take 81 mg by mouth daily.    [name] atorvastatin (LIPITOR) 20 MG tablet Take 1 tablet (20 mg total) by mouth at bedtime. 90 tablet 3  . azelastine (ASTELIN) 0.1 % nasal spray Place 2 sprays into both nostrils at bedtime as needed for rhinitis. Use in each nostril as directed    . CO ENZYME Q-10 PO Take by mouth daily.    . fish oil-omega-3 fatty acids 1000 MG capsule Take 1 g by mouth daily.    Javier Docker Oil 500 MG CAPS Take 500 mg by mouth daily.    . Melatonin 1 MG CAPS Take 1 mg by mouth at bedtime as needed.     . metFORMIN (GLUCOPHAGE) 500 MG tablet Take 1 tablet (500 mg total) 2 (two) times daily with a meal by mouth. 60 tablet 0  . Multiple Vitamins-Minerals (CENTRUM PO) Take 1 tablet by mouth daily.    . Turmeric 500 MG CAPS Take 500 mg by mouth  daily.    Cristino Martes Root 450 MG CAPS Take 450 mg by mouth at bedtime as needed.     . Vitamin D, Ergocalciferol, (DRISDOL) 50000 units CAPS capsule Take 1 capsule (50,000 Units total) every 7 (seven) days by mouth. 4 capsule 0   No current facility-administered medications on file prior to visit.     PAST MEDICAL HISTORY: Past Medical History:  Diagnosis Date  . Dental crowns present   . Gallbladder problem   . Hyperlipidemia   . Lower back pain   . Nephrolithiasis 2017  . Prediabetes 09/04/2014  . Seasonal allergies   . Sebaceous cyst 04/2013   upper back  . Sleep apnea    uses CPAP nightly  . Urolithiasis 10/2016    PAST SURGICAL HISTORY: Past Surgical History:  Procedure Laterality Date  . ADENOIDECTOMY     as a child  . CHOLECYSTECTOMY  01/12/2012   Procedure: LAPAROSCOPIC CHOLECYSTECTOMY WITH INTRAOPERATIVE CHOLANGIOGRAM;  Surgeon: [name], MD;  Location: [facility];  Service: General;  Laterality: N/A;  . CYST EXCISION     cyst removal from back  . EAR CYST EXCISION N/A 05/04/2013   Procedure: Excision subcutaneous mass posterior neck;  Surgeon: Imogene Burn. [name], MD;  Location: [facility];  Service: General;  Laterality: N/A;  SOCIAL HISTORY: Social History   Tobacco Use  . Smoking status: Never Smoker  . Smokeless tobacco: Never Used  Substance Use Topics  . Alcohol use: Yes    Comment: 1 glass wine every other day  . Drug use: No    FAMILY HISTORY: Family History  Problem Relation Age of Onset  . Cancer Mother        ovarian  . Hypertension Mother   . Hyperlipidemia Mother   . Obesity Mother   . Cancer Brother        leukemia  . Diabetes Brother   . Colon cancer Maternal Aunt 55  . Cancer Paternal Grandmother        breast  . Cancer Father 43       renal  . Kidney disease Father   . Sleep apnea Father   . Prostate cancer Neg Hx   . Esophageal cancer Neg Hx   . Rectal cancer Neg Hx   . Stomach cancer Neg Hx   . CAD Neg Hx      ROS: Review of Systems  Constitutional: Positive for weight loss.  Neurological: Negative for dizziness and headaches.       Negative lightheadedness    PHYSICAL EXAM: Blood pressure 139/76, pulse 69, temperature 98 F (36.7 C), temperature source Oral, height 5\' 10"  (1.778 m), weight (!) 312 lb (141.5 kg), SpO2 96 %. Body mass index is 44.77 kg/m. Physical Exam  Constitutional: He is oriented to person, place, and time. He appears well-developed and well-nourished.  Cardiovascular: Normal rate.  Pulmonary/Chest: Effort normal.  Musculoskeletal: Normal range of motion.  Neurological: He is oriented to person, place, and time.  Skin: Skin is warm and dry.  Psychiatric: He has a normal mood and affect. His behavior is normal.  Vitals reviewed.   RECENT LABS AND TESTS: BMET    Component Value Date/Time   NA 137 09/08/2017 1144   K 4.1 09/08/2017 1144   CL 101 09/08/2017 1144   CO2 23 09/08/2017 1144   GLUCOSE 86 09/08/2017 1144   GLUCOSE 104 (H) 06/23/2017 1028   BUN 14 09/08/2017 1144   CREATININE 0.75 (L) 09/08/2017 1144   CALCIUM 9.3 09/08/2017 1144   GFRNONAA 104 09/08/2017 1144   GFRAA 120 09/08/2017 1144   Lab Results  Component Value Date   HGBA1C 5.9 (H) 09/08/2017   HGBA1C 5.9 06/23/2017   HGBA1C 6.0 11/16/2016   HGBA1C 6.0 09/04/2014   HGBA1C 6.1 03/02/2014   Lab Results  Component Value Date   INSULIN 21.9 09/08/2017   CBC    Component Value Date/Time   WBC 7.8 09/08/2017 1144   WBC 5.9 06/23/2017 1028   RBC 5.15 09/08/2017 1144   RBC 5.03 06/23/2017 1028   HGB 14.7 09/08/2017 1144   HCT 43.8 09/08/2017 1144   PLT 253.0 06/23/2017 1028   MCV 85 09/08/2017 1144   MCH 28.5 09/08/2017 1144   MCH 28.8 01/13/2012 0328   MCHC 33.6 09/08/2017 1144   MCHC 32.9 06/23/2017 1028   RDW 13.8 09/08/2017 1144   LYMPHSABS 2.2 09/08/2017 1144   MONOABS 0.4 06/23/2017 1028   EOSABS 0.2 09/08/2017 1144   BASOSABS 0.1 09/08/2017 1144    Iron/TIBC/Ferritin/ %Sat No results found for: IRON, TIBC, FERRITIN, IRONPCTSAT Lipid Panel     Component Value Date/Time   CHOL 151 09/08/2017 1144   TRIG 175 (H) 09/08/2017 1144   HDL 44 09/08/2017 1144   CHOLHDL 3 06/23/2017 1028   VLDL 26.2  06/23/2017 1028   LDLCALC 72 09/08/2017 1144   LDLDIRECT 136.0 11/16/2016 0902   Hepatic Function Panel     Component Value Date/Time   PROT 7.3 09/08/2017 1144   ALBUMIN 4.3 09/08/2017 1144   AST 16 09/08/2017 1144   ALT 23 09/08/2017 1144   ALKPHOS 49 09/08/2017 1144   BILITOT 0.4 09/08/2017 1144   BILIDIR 0.0 02/26/2012 1353      Component Value Date/Time   TSH 1.400 09/08/2017 1144   TSH 1.31 06/23/2017 1028   TSH 1.61 03/02/2014 0847    ASSESSMENT AND PLAN: Prehypertension  Class 3 severe obesity with serious comorbidity and body mass index (BMI) of 40.0 to 44.9 in adult, unspecified obesity type ([place])  PLAN:  Pre-Hypertension Yitzchok agrees to work on diet, decrease sodium, and continue with weight loss. We will continue to monitor his blood pressure as well as his progress with the above lifestyle modifications and Daymond agrees to follow up with our clinic in 2 weeks.  We spent > than 50% of the 15 minute visit on the counseling as documented in the note.  Obesity Kenneth is currently in the action stage of change. As such, his goal is to continue with weight loss efforts He has agreed to follow the Category 3 plan Japhet has been instructed to work up to a goal of 150 minutes of combined cardio and strengthening exercise per week for weight loss and overall health benefits. We discussed the following Behavioral Modification Strategies today: increasing lean protein intake, decreasing simple carbohydrates  and decrease eating out   Stacy has agreed to follow up with our clinic in 2 weeks. He was informed of the importance of frequent follow up visits to maximize his success with intensive lifestyle  modifications for his multiple health conditions.  I, Trixie Dredge, am acting as transcriptionist for Dennard Nip, MD  I have reviewed the above documentation for accuracy and completeness, and I agree with the above. -Dennard Nip, MD     Today's visit was # 5 out of 22.  Starting weight: 326 lbs Starting date: 09/08/17 Today's weight : 312 lbs  Today's date: 11/03/2017 Total lbs lost to date: 14 (Patients must lose 7 lbs in the first 6 months to continue with counseling)   ASK: We discussed the diagnosis of obesity with [name] today and Ascher agreed to give [place] permission to discuss obesity behavioral modification therapy today.  ASSESS: Leanthony has the diagnosis of obesity and his BMI today is 44.77 Ziare is in the action stage of change   ADVISE: Jahmel was educated on the multiple health risks of obesity as well as the benefit of weight loss to improve his health. He was advised of the need for long term treatment and the importance of lifestyle modifications.  AGREE: Multiple dietary modification options and treatment options were discussed and  Glendell agreed to follow the Category 3 plan We discussed the following Behavioral Modification Strategies today: increasing lean protein intake, decreasing simple carbohydrates  and decrease eating out

## 2017-11-10 ENCOUNTER — Encounter: Payer: Self-pay | Admitting: Pulmonary Disease

## 2017-11-10 ENCOUNTER — Ambulatory Visit: Payer: BC Managed Care – PPO | Admitting: Pulmonary Disease

## 2017-11-10 VITALS — BP 124/80 | HR 81 | Ht 71.0 in | Wt 319.2 lb

## 2017-11-10 DIAGNOSIS — R05 Cough: Secondary | ICD-10-CM | POA: Diagnosis not present

## 2017-11-10 DIAGNOSIS — Z9989 Dependence on other enabling machines and devices: Secondary | ICD-10-CM

## 2017-11-10 DIAGNOSIS — Z23 Encounter for immunization: Secondary | ICD-10-CM

## 2017-11-10 DIAGNOSIS — G4733 Obstructive sleep apnea (adult) (pediatric): Secondary | ICD-10-CM

## 2017-11-10 DIAGNOSIS — Z6841 Body Mass Index (BMI) 40.0 and over, adult: Secondary | ICD-10-CM | POA: Diagnosis not present

## 2017-11-10 DIAGNOSIS — R058 Other specified cough: Secondary | ICD-10-CM

## 2017-11-10 NOTE — Patient Instructions (Signed)
Flu shot today Follow up in 1 year 

## 2017-11-10 NOTE — Progress Notes (Signed)
Current Outpatient Medications on File Prior to Visit  Medication Sig  . aspirin 81 MG tablet Take 81 mg by mouth daily.  [name] atorvastatin (LIPITOR) 20 MG tablet Take 1 tablet (20 mg total) by mouth at bedtime.  [name] azelastine (ASTELIN) 0.1 % nasal spray Place 2 sprays into both nostrils at bedtime as needed for rhinitis. Use in each nostril as directed  . CO ENZYME Q-10 PO Take by mouth daily.  . fish oil-omega-3 fatty acids 1000 MG capsule Take 1 g by mouth daily.  Javier Docker Oil 500 MG CAPS Take 500 mg by mouth daily.  . Melatonin 1 MG CAPS Take 1 mg by mouth at bedtime as needed.   . metFORMIN (GLUCOPHAGE) 500 MG tablet Take 1 tablet (500 mg total) 2 (two) times daily with a meal by mouth.  . Multiple Vitamins-Minerals (CENTRUM PO) Take 1 tablet by mouth daily.  . Turmeric 500 MG CAPS Take 500 mg by mouth daily.  Cristino Martes Root 450 MG CAPS Take 450 mg by mouth at bedtime as needed.   . Vitamin D, Ergocalciferol, (DRISDOL) 50000 units CAPS capsule Take 1 capsule (50,000 Units total) every 7 (seven) days by mouth.   No current facility-administered medications on file prior to visit.     Chief Complaint  Patient presents with  . Follow-up    follow up for osa    Sleep tests CPAP 10/11/17 to 11/09/17 >> used on 30 of 30 nights with average 7 hrs 30 min.  Average AHI 1.5 with CPAP 10 cm H2O  Cardiac tests Echo 09/21/17 >> EF 65 to 70%, grade 2 DD, mod RA dilation  Past medical history HLD, Allergies, Pre-DM, Nephrolithiasis  Past surgical hx, Medications, Allergies, Family hx, Social hx all reviewed.  Vital signs BP 124/80 (BP Location: Left Arm, Cuff Size: Normal)   Pulse 81   Ht 5\' 11"  (1.803 m)   Wt (!) 319 lb 3.2 oz (144.8 kg)   SpO2 96%   BMI 44.52 kg/m    History of Present Illness: Bryan [name] is a 56 y.o. male with OSA.  He hasn't had recurrence of kidney stone since last year.  He is drinking more water.  He signed up for health program with [facility] and is  changing his life still.  He gets sinus congestion and feels congested in his ears.  He gets a tickle in his throat and then clears his throat or coughs.  He uses a neti pot and flonase intermittently.  He uses CPAP nightly.  No issues with mask fit.  Bought a So-Clean > helps a lot.   Physical Exam:  General - pleasant Eyes - pupils reactive ENT - no sinus tenderness, no oral exudate, no LAN, TM clear, MP 3 Cardiac - regular, no murmur Chest - no wheeze, rales Abd - soft, non tender Ext - no edema Skin - no rashes Neuro - normal strength Psych - normal mood  Assessment/Plan:  Obstructive sleep apnea. - he is compliant with CPAP and reports benefit from therapy - continue CPAP 10 cm H2O - he should be eligible for a new machine in 2019  Upper airway cough syndrome with post nasal drip. - advised him to use flonase and nasal irrigation on regular basis until symptoms improve - if this persists, then could consider dropping his CPAP pressure down some to decrease sinus irritation  Obesity. - discussed options to assist with weight loss  Influenza vaccination given today.   Patient  Instructions  Flu shot today  Follow up in 1 year   Chesley Mires, MD [facility] Pager:  [phone] 11/10/2017, 9:28 AM

## 2017-11-17 ENCOUNTER — Encounter (INDEPENDENT_AMBULATORY_CARE_PROVIDER_SITE_OTHER): Payer: Self-pay | Admitting: Family Medicine

## 2017-11-17 ENCOUNTER — Ambulatory Visit (INDEPENDENT_AMBULATORY_CARE_PROVIDER_SITE_OTHER): Payer: BC Managed Care – PPO | Admitting: Family Medicine

## 2017-11-17 VITALS — BP 142/87 | HR 78 | Temp 98.1°F | Ht 71.0 in | Wt 316.0 lb

## 2017-11-17 DIAGNOSIS — R7303 Prediabetes: Secondary | ICD-10-CM | POA: Diagnosis not present

## 2017-11-17 DIAGNOSIS — Z6841 Body Mass Index (BMI) 40.0 and over, adult: Secondary | ICD-10-CM

## 2017-11-17 DIAGNOSIS — E559 Vitamin D deficiency, unspecified: Secondary | ICD-10-CM

## 2017-11-17 MED ORDER — VITAMIN D (ERGOCALCIFEROL) 1.25 MG (50000 UNIT) PO CAPS: 50000.0000 [IU] | ORAL_CAPSULE | ORAL | 0 refills | Status: DC

## 2017-11-17 MED ORDER — METFORMIN HCL 500 MG PO TABS: 500.0000 mg | ORAL_TABLET | Freq: Two times a day (BID) | ORAL | 0 refills | Status: DC

## 2017-11-17 NOTE — Progress Notes (Signed)
Office: [phone]  /  Fax: [phone]   HPI:   Chief Complaint: OBESITY Bryan [name] is here to discuss his progress with his obesity treatment plan. He is on the Category 3 plan and is following his eating plan approximately 80 % of the time. He states he is walking for 45 minutes 2 times per week. Bryan [name] has gotten off track with increased temptations and while off his normal routine. He has increased eating out for work, but is working on cutting back on this. His weight is (!) 316 lb (143.3 kg) today and has had a weight gain of 4 pounds over a period of 2 weeks since his last visit. He has lost 10 lbs since starting treatment with [place].  Pre-Diabetes Bryan [name] has a diagnosis of pre-diabetes based on his elevated Hgb A1c and was informed this puts him at greater risk of developing diabetes. Bryan [name] is struggling to follow diet as closely as closely with increased eating out. He is doing well with medications. He is taking metformin currently and continues to work on diet and exercise to decrease risk of diabetes. He denies nausea or hypoglycemia.  Vitamin D deficiency Bryan [name] has a diagnosis of vitamin D deficiency. Bryan [name] is currently stable on vit D, not yet at goal and he denies nausea, vomiting or muscle weakness.  ALLERGIES: No Known Allergies  MEDICATIONS: Current Outpatient Medications on File Prior to Visit  Medication Sig Dispense Refill  . aspirin 81 MG tablet Take 81 mg by mouth daily.    [name] atorvastatin (LIPITOR) 20 MG tablet Take 1 tablet (20 mg total) by mouth at bedtime. 90 tablet 3  . azelastine (ASTELIN) 0.1 % nasal spray Place 2 sprays into both nostrils at bedtime as needed for rhinitis. Use in each nostril as directed    . CO ENZYME Q-10 PO Take by mouth daily.    . fish [name]-omega-3 fatty acids 1000 MG capsule Take 1 g by mouth daily.    Bryan [name] 500 MG CAPS Take 500 mg by mouth daily.    . Melatonin 1 MG CAPS Take 1 mg by mouth at bedtime as needed.     .  Multiple Vitamins-Minerals (CENTRUM PO) Take 1 tablet by mouth daily.    . Turmeric 500 MG CAPS Take 500 mg by mouth daily.    Bryan [name] 450 MG CAPS Take 450 mg by mouth at bedtime as needed.      No current facility-administered medications on file prior to visit.     PAST MEDICAL HISTORY: Past Medical History:  Diagnosis Date  . Dental crowns present   . Gallbladder problem   . Hyperlipidemia   . Lower back pain   . Nephrolithiasis 2017  . Prediabetes 09/04/2014  . Seasonal allergies   . Sebaceous cyst 04/2013   upper back  . Sleep apnea    uses CPAP nightly  . Urolithiasis 10/2016    PAST SURGICAL HISTORY: Past Surgical History:  Procedure Laterality Date  . ADENOIDECTOMY     as a child  . CHOLECYSTECTOMY  01/12/2012   Procedure: LAPAROSCOPIC CHOLECYSTECTOMY WITH INTRAOPERATIVE CHOLANGIOGRAM;  Surgeon: [name], MD;  Location: [facility];  Service: General;  Laterality: N/A;  . CYST EXCISION     cyst removal from back  . EAR CYST EXCISION N/A 05/04/2013   Procedure: Excision subcutaneous mass posterior neck;  Surgeon: Imogene Burn. [name], MD;  Location: [place];  Service: General;  Laterality: N/A;    SOCIAL  HISTORY: Social History   Tobacco Use  . Smoking status: Never Smoker  . Smokeless tobacco: Never Used  Substance Use Topics  . Alcohol use: Yes    Comment: 1 glass wine every other day  . Drug use: No    FAMILY HISTORY: Family History  Problem Relation Age of Onset  . Cancer Mother        ovarian  . Hypertension Mother   . Hyperlipidemia Mother   . Obesity Mother   . Cancer Brother        leukemia  . Diabetes Brother   . Colon cancer Maternal Aunt 28  . Cancer Paternal Grandmother        breast  . Cancer Father 5       renal  . Kidney disease Father   . Sleep apnea Father   . Prostate cancer Neg Hx   . Esophageal cancer Neg Hx   . Rectal cancer Neg Hx   . Stomach cancer Neg Hx   . CAD Neg Hx     ROS: Review of  Systems  Constitutional: Negative for weight loss.  Gastrointestinal: Negative for nausea and vomiting.  Musculoskeletal:       Negative muscle weakness  Endo/Heme/Allergies:       Negative hypoglycemia    PHYSICAL EXAM: Blood pressure (!) 142/87, pulse 78, temperature 98.1 F (36.7 C), temperature source Oral, height 5\' 11"  (1.803 m), weight (!) 316 lb (143.3 kg), SpO2 98 %. Body mass index is 44.07 kg/m. Physical Exam  Constitutional: He is oriented to person, place, and time. He appears well-developed and well-nourished.  Cardiovascular: Normal rate.  Pulmonary/Chest: Effort normal.  Musculoskeletal: Normal range of motion.  Neurological: He is oriented to person, place, and time.  Skin: Skin is warm and dry.  Psychiatric: He has a normal mood and affect. His behavior is normal.  Vitals reviewed.   RECENT LABS AND TESTS: BMET    Component Value Date/Time   NA 137 09/08/2017 1144   K 4.1 09/08/2017 1144   CL 101 09/08/2017 1144   CO2 23 09/08/2017 1144   GLUCOSE 86 09/08/2017 1144   GLUCOSE 104 (H) 06/23/2017 1028   BUN 14 09/08/2017 1144   CREATININE 0.75 (L) 09/08/2017 1144   CALCIUM 9.3 09/08/2017 1144   GFRNONAA 104 09/08/2017 1144   GFRAA 120 09/08/2017 1144   Lab Results  Component Value Date   HGBA1C 5.9 (H) 09/08/2017   HGBA1C 5.9 06/23/2017   HGBA1C 6.0 11/16/2016   HGBA1C 6.0 09/04/2014   HGBA1C 6.1 03/02/2014   Lab Results  Component Value Date   INSULIN 21.9 09/08/2017   CBC    Component Value Date/Time   WBC 7.8 09/08/2017 1144   WBC 5.9 06/23/2017 1028   RBC 5.15 09/08/2017 1144   RBC 5.03 06/23/2017 1028   HGB 14.7 09/08/2017 1144   HCT 43.8 09/08/2017 1144   PLT 253.0 06/23/2017 1028   MCV 85 09/08/2017 1144   MCH 28.5 09/08/2017 1144   MCH 28.8 01/13/2012 0328   MCHC 33.6 09/08/2017 1144   MCHC 32.9 06/23/2017 1028   RDW 13.8 09/08/2017 1144   LYMPHSABS 2.2 09/08/2017 1144   MONOABS 0.4 06/23/2017 1028   EOSABS 0.2 09/08/2017  1144   BASOSABS 0.1 09/08/2017 1144   Iron/TIBC/Ferritin/ %Sat No results found for: IRON, TIBC, FERRITIN, IRONPCTSAT Lipid Panel     Component Value Date/Time   CHOL 151 09/08/2017 1144   TRIG 175 (H) 09/08/2017 1144   HDL  44 09/08/2017 1144   CHOLHDL 3 06/23/2017 1028   VLDL 26.2 06/23/2017 1028   LDLCALC 72 09/08/2017 1144   LDLDIRECT 136.0 11/16/2016 0902   Hepatic Function Panel     Component Value Date/Time   PROT 7.3 09/08/2017 1144   ALBUMIN 4.3 09/08/2017 1144   AST 16 09/08/2017 1144   ALT 23 09/08/2017 1144   ALKPHOS 49 09/08/2017 1144   BILITOT 0.4 09/08/2017 1144   BILIDIR 0.0 02/26/2012 1353      Component Value Date/Time   TSH 1.400 09/08/2017 1144   TSH 1.31 06/23/2017 1028   TSH 1.61 03/02/2014 0847    ASSESSMENT AND PLAN: Prediabetes - Plan: metFORMIN (GLUCOPHAGE) 500 MG tablet  Vitamin D deficiency - Plan: Vitamin D, Ergocalciferol, (DRISDOL) 50000 units CAPS capsule  Class 3 severe obesity with serious comorbidity and body mass index (BMI) of 40.0 to 44.9 in adult, unspecified obesity type ([place])  PLAN:  Pre-Diabetes Demarius will continue to work on weight loss, exercise, and decreasing simple carbohydrates in his diet to help decrease the risk of diabetes. We dicussed metformin including benefits and risks. He was informed that eating too many simple carbohydrates or too many calories at one sitting increases the likelihood of GI side effects. Nayan requested metformin for now and a prescription was written today for 1 month refill. Heston agreed to follow up with [place] as directed to monitor his progress.  Vitamin D Deficiency Gohan was informed that low vitamin D levels contributes to fatigue and are associated with obesity, breast, and colon cancer. He agrees to continue to take prescription Vit D @50 ,000 IU every week #4 with no refills and will follow up for routine testing of vitamin D, at least 2-3 times per year. He was informed of the risk  of over-replacement of vitamin D and agrees to not increase his dose unless he discusses this with [place] first. Aarit agrees to follow up with our clinic in 2 weeks.  Obesity Berish is currently in the action stage of change. As such, his goal is to continue with weight loss efforts He has agreed to keep a food journal with 400 to 700 calories and 40+ grams of protein at supper daily and follow the Category 3 plan Abelardo has been instructed to work up to a goal of 150 minutes of combined cardio and strengthening exercise per week for weight loss and overall health benefits. We discussed the following Behavioral Modification Strategies today: no skipping meals, travel eating strategies and work on meal planning and easy cooking plans  [employer] on working on pre-planning better and not skipping meals.  Manus has agreed to follow up with our clinic in 2 weeks. He was informed of the importance of frequent follow up visits to maximize his success with intensive lifestyle modifications for his multiple health conditions.  I, Doreene Nest, am acting as transcriptionist for Dennard Nip, MD  I have reviewed the above documentation for accuracy and completeness, and I agree with the above. -Dennard Nip, MD    OBESITY BEHAVIORAL INTERVENTION VISIT  Today's visit was # 6 out of 22.  Starting weight: 326 lbs Starting date: 09/08/17 Today's weight : 316 lbs Today's date: 11/17/2017 Total lbs lost to date: 10 (Patients must lose 7 lbs in the first 6 months to continue with counseling)   ASK: We discussed the diagnosis of obesity with [name] today and Cliffton agreed to give [place] permission to discuss obesity behavioral modification therapy today.  ASSESS:  Kieon has the diagnosis of obesity and his BMI today is 44.09 Seena is in the action stage of change   ADVISE: Pilar was educated on the multiple health risks of obesity as well as the benefit of weight loss to  improve his health. He was advised of the need for long term treatment and the importance of lifestyle modifications.  AGREE: Multiple dietary modification options and treatment options were discussed and  Haidar agreed to keep a food journal with 400 to 700 calories and 40+ grams of protein at supper daily and follow the Category 3 plan We discussed the following Behavioral Modification Strategies today: no skipping meals, travel eating strategies and work on meal planning and easy cooking plans

## 2017-12-08 ENCOUNTER — Ambulatory Visit (INDEPENDENT_AMBULATORY_CARE_PROVIDER_SITE_OTHER): Payer: BC Managed Care – PPO | Admitting: Family Medicine

## 2017-12-08 ENCOUNTER — Encounter (INDEPENDENT_AMBULATORY_CARE_PROVIDER_SITE_OTHER): Payer: Self-pay | Admitting: Family Medicine

## 2017-12-08 VITALS — BP 145/84 | HR 78 | Temp 98.2°F | Ht 71.0 in | Wt 316.0 lb

## 2017-12-08 DIAGNOSIS — R7303 Prediabetes: Secondary | ICD-10-CM

## 2017-12-08 DIAGNOSIS — E559 Vitamin D deficiency, unspecified: Secondary | ICD-10-CM

## 2017-12-08 DIAGNOSIS — I10 Essential (primary) hypertension: Secondary | ICD-10-CM

## 2017-12-08 DIAGNOSIS — Z6841 Body Mass Index (BMI) 40.0 and over, adult: Secondary | ICD-10-CM | POA: Diagnosis not present

## 2017-12-08 MED ORDER — METFORMIN HCL 500 MG PO TABS: 500.0000 mg | ORAL_TABLET | Freq: Two times a day (BID) | ORAL | 0 refills | Status: DC

## 2017-12-08 MED ORDER — VITAMIN D (ERGOCALCIFEROL) 1.25 MG (50000 UNIT) PO CAPS: 50000.0000 [IU] | ORAL_CAPSULE | ORAL | 0 refills | Status: DC

## 2017-12-08 MED ORDER — HYDROCHLOROTHIAZIDE 25 MG PO TABS: 25.0000 mg | ORAL_TABLET | Freq: Every day | ORAL | 0 refills | Status: DC

## 2017-12-09 NOTE — Progress Notes (Signed)
Office: [phone]  /  Fax: [phone]   HPI:   Chief Complaint: OBESITY Bryan [name] is here to discuss his progress with his obesity treatment plan. He is on the keep a food journal with 400 to 700 calories and 40+ grams of protein at supper daily and the Category 3 plan and is following his eating plan approximately 60 % of the time. He states he is exercising yoga and walking for 45 minutes 4 times per week. Bryan [name] did well maintaining weight over Christmas and New Year's, but he still was mindful. Bryan [name] is going to be traveling again for work and has questions about the best way to eat on the road. His weight is (!) 316 lb (143.3 kg) today and has maintained weight over a period of 3 weeks since his last visit. He has lost 10 lbs since starting treatment with [place].  Hypertension Bryan [name] is a 57 y.o. male with hypertension. He has had multiple borderline blood pressure readings for the last 2 to 3 months and was not on medications previously. Bryan [name] denies chest pain or shortness of breath on exertion. He is working weight loss to help control his blood pressure with the goal of decreasing his risk of heart attack and stroke. Bryan [name] blood pressure is not currently controlled.  Vitamin D deficiency Bryan [name] has a diagnosis of vitamin D deficiency. He is stable on vit D, but not yet at goal. Bryan [name] denies nausea, vomiting or muscle weakness.   Ref. Range 09/08/2017 11:44  Vitamin D, 25-Hydroxy Latest Ref Range: 30.0 - 100.0 ng/mL 29.7 (L)   Pre-Diabetes Bryan [name] has a diagnosis of pre-diabetes based on his elevated Hgb A1c and was informed this puts him at greater risk of developing diabetes. He is stable on metformin, but is not following the plan as well over the holidays. He is ready to get back on track. Bryan [name] continues to work on diet and exercise to decrease risk of diabetes. He denies nausea or hypoglycemia.  ALLERGIES: No Known  Allergies  MEDICATIONS: Current Outpatient Medications on File Prior to Visit  Medication Sig Dispense Refill  . aspirin 81 MG tablet Take 81 mg by mouth daily.    [name] atorvastatin (LIPITOR) 20 MG tablet Take 1 tablet (20 mg total) by mouth at bedtime. 90 tablet 3  . azelastine (ASTELIN) 0.1 % nasal spray Place 2 sprays into both nostrils at bedtime as needed for rhinitis. Use in each nostril as directed    . CO ENZYME Q-10 PO Take by mouth daily.    . fish oil-omega-3 fatty acids 1000 MG capsule Take 1 g by mouth daily.    Bryan [name] Oil 500 MG CAPS Take 500 mg by mouth daily.    . Melatonin 1 MG CAPS Take 1 mg by mouth at bedtime as needed.     . Multiple Vitamins-Minerals (CENTRUM PO) Take 1 tablet by mouth daily.    . Turmeric 500 MG CAPS Take 500 mg by mouth daily.    Bryan [name] 450 MG CAPS Take 450 mg by mouth at bedtime as needed.      No current facility-administered medications on file prior to visit.     PAST MEDICAL HISTORY: Past Medical History:  Diagnosis Date  . Dental crowns present   . Gallbladder problem   . Hyperlipidemia   . Lower back pain   . Nephrolithiasis 2017  . Prediabetes 09/04/2014  . Seasonal allergies   . Sebaceous cyst 04/2013  upper back  . Sleep apnea    uses CPAP nightly  . Urolithiasis 10/2016    PAST SURGICAL HISTORY: Past Surgical History:  Procedure Laterality Date  . ADENOIDECTOMY     as a child  . CHOLECYSTECTOMY  01/12/2012   Procedure: LAPAROSCOPIC CHOLECYSTECTOMY WITH INTRAOPERATIVE CHOLANGIOGRAM;  Surgeon: [name], MD;  Location: [facility];  Service: General;  Laterality: N/A;  . CYST EXCISION     cyst removal from back  . EAR CYST EXCISION N/A 05/04/2013   Procedure: Excision subcutaneous mass posterior neck;  Surgeon: Imogene Burn. [name], MD;  Location: [place];  Service: General;  Laterality: N/A;    SOCIAL HISTORY: Social History   Tobacco Use  . Smoking status: Never Smoker  . Smokeless tobacco:  Never Used  Substance Use Topics  . Alcohol use: Yes    Comment: 1 glass wine every other day  . Drug use: No    FAMILY HISTORY: Family History  Problem Relation Age of Onset  . Cancer Mother        ovarian  . Hypertension Mother   . Hyperlipidemia Mother   . Obesity Mother   . Cancer Brother        leukemia  . Diabetes Brother   . Colon cancer Maternal Aunt 82  . Cancer Paternal Grandmother        breast  . Cancer Father 27       renal  . Kidney disease Father   . Sleep apnea Father   . Prostate cancer Neg Hx   . Esophageal cancer Neg Hx   . Rectal cancer Neg Hx   . Stomach cancer Neg Hx   . CAD Neg Hx     ROS: Review of Systems  Constitutional: Negative for weight loss.  Respiratory: Negative for shortness of breath (on exertion).   Cardiovascular: Negative for chest pain.  Gastrointestinal: Negative for nausea and vomiting.  Musculoskeletal:       Negative muscle weakness  Endo/Heme/Allergies:       Negative hypoglycemia    PHYSICAL EXAM: Blood pressure (!) 145/84, pulse 78, temperature 98.2 F (36.8 C), temperature source Oral, height 5\' 11"  (1.803 m), weight (!) 316 lb (143.3 kg), SpO2 98 %. Body mass index is 44.07 kg/m. Physical Exam  Constitutional: He is oriented to person, place, and time. He appears well-developed and well-nourished.  Cardiovascular: Normal rate.  Pulmonary/Chest: Effort normal.  Musculoskeletal: Normal range of motion.  Neurological: He is oriented to person, place, and time.  Skin: Skin is warm and dry.  Psychiatric: He has a normal mood and affect. His behavior is normal.  Vitals reviewed.   RECENT LABS AND TESTS: BMET    Component Value Date/Time   NA 137 09/08/2017 1144   K 4.1 09/08/2017 1144   CL 101 09/08/2017 1144   CO2 23 09/08/2017 1144   GLUCOSE 86 09/08/2017 1144   GLUCOSE 104 (H) 06/23/2017 1028   BUN 14 09/08/2017 1144   CREATININE 0.75 (L) 09/08/2017 1144   CALCIUM 9.3 09/08/2017 1144   GFRNONAA 104  09/08/2017 1144   GFRAA 120 09/08/2017 1144   Lab Results  Component Value Date   HGBA1C 5.9 (H) 09/08/2017   HGBA1C 5.9 06/23/2017   HGBA1C 6.0 11/16/2016   HGBA1C 6.0 09/04/2014   HGBA1C 6.1 03/02/2014   Lab Results  Component Value Date   INSULIN 21.9 09/08/2017   CBC    Component Value Date/Time   WBC 7.8 09/08/2017 1144  WBC 5.9 06/23/2017 1028   RBC 5.15 09/08/2017 1144   RBC 5.03 06/23/2017 1028   HGB 14.7 09/08/2017 1144   HCT 43.8 09/08/2017 1144   PLT 253.0 06/23/2017 1028   MCV 85 09/08/2017 1144   MCH 28.5 09/08/2017 1144   MCH 28.8 01/13/2012 0328   MCHC 33.6 09/08/2017 1144   MCHC 32.9 06/23/2017 1028   RDW 13.8 09/08/2017 1144   LYMPHSABS 2.2 09/08/2017 1144   MONOABS 0.4 06/23/2017 1028   EOSABS 0.2 09/08/2017 1144   BASOSABS 0.1 09/08/2017 1144   Iron/TIBC/Ferritin/ %Sat No results found for: IRON, TIBC, FERRITIN, IRONPCTSAT Lipid Panel     Component Value Date/Time   CHOL 151 09/08/2017 1144   TRIG 175 (H) 09/08/2017 1144   HDL 44 09/08/2017 1144   CHOLHDL 3 06/23/2017 1028   VLDL 26.2 06/23/2017 1028   LDLCALC 72 09/08/2017 1144   LDLDIRECT 136.0 11/16/2016 0902   Hepatic Function Panel     Component Value Date/Time   PROT 7.3 09/08/2017 1144   ALBUMIN 4.3 09/08/2017 1144   AST 16 09/08/2017 1144   ALT 23 09/08/2017 1144   ALKPHOS 49 09/08/2017 1144   BILITOT 0.4 09/08/2017 1144   BILIDIR 0.0 02/26/2012 1353      Component Value Date/Time   TSH 1.400 09/08/2017 1144   TSH 1.31 06/23/2017 1028   TSH 1.61 03/02/2014 0847     Ref. Range 09/08/2017 11:44  Vitamin D, 25-Hydroxy Latest Ref Range: 30.0 - 100.0 ng/mL 29.7 (L)   ASSESSMENT AND PLAN: Essential hypertension - Plan: hydrochlorothiazide (HYDRODIURIL) 25 MG tablet  Vitamin D deficiency - Plan: Vitamin D, Ergocalciferol, (DRISDOL) 50000 units CAPS capsule  Prediabetes - Plan: metFORMIN (GLUCOPHAGE) 500 MG tablet  Class 3 severe obesity with serious comorbidity and body  mass index (BMI) of 40.0 to 44.9 in adult, unspecified obesity type ([place])  PLAN:  Hypertension We discussed sodium restriction, working on healthy weight loss, and a regular exercise program as the means to achieve improved blood pressure control. Deontez agreed with this plan and agreed to follow up as directed. We will continue to monitor his blood pressure as well as his progress with the above lifestyle modifications. He agrees to start HCTZ 25 mg qam #30 with no refills and will watch for signs of hypotension as he continues his lifestyle modifications.  Vitamin D Deficiency Airen was informed that low vitamin D levels contributes to fatigue and are associated with obesity, breast, and colon cancer. He agrees to continue to take prescription Vit D @50 ,000 IU every week #4 with no refills and will follow up for routine testing of vitamin D, at least 2-3 times per year. He was informed of the risk of over-replacement of vitamin D and agrees to not increase his dose unless he discusses this with [place] first. Brylee agrees to follow up with our clinic in 2 weeks.  Pre-Diabetes Drury will continue to work on weight loss, exercise, and decreasing simple carbohydrates in his diet to help decrease the risk of diabetes. We dicussed metformin including benefits and risks. He was informed that eating too many simple carbohydrates or too many calories at one sitting increases the likelihood of GI side effects. Naythan requested metformin for now and a prescription was written today for 1 month refill. Tresean agreed to follow up with [place] as directed to monitor his progress.  Obesity Greydon is currently in the action stage of change. As such, his goal is to continue with weight loss efforts  He has agreed to keep a food journal with 500 to 700 calories and 40+ grams of protein at supper daily and follow the Category 3 plan Sadao has been instructed to work up to a goal of 150 minutes of combined cardio  and strengthening exercise per week for weight loss and overall health benefits. We discussed the following Behavioral Modification Strategies today: work on meal planning and easy cooking plans, travel eating strategies and keep a strict food journal  Basel has agreed to follow up with our clinic in 2 weeks. He was informed of the importance of frequent follow up visits to maximize his success with intensive lifestyle modifications for his multiple health conditions.    OBESITY BEHAVIORAL INTERVENTION VISIT  Today's visit was # 7 out of 22.  Starting weight: 326 lbs Starting date: 09/08/17 Today's weight : 316 lbs Today's date: 12/08/2017 Total lbs lost to date: 10 (Patients must lose 7 lbs in the first 6 months to continue with counseling)   ASK: We discussed the diagnosis of obesity with Bryan [name] today and Tran agreed to give [place] permission to discuss obesity behavioral modification therapy today.  ASSESS: Laverne has the diagnosis of obesity and his BMI today is 44.09 Vishwa is in the action stage of change   ADVISE: Son was educated on the multiple health risks of obesity as well as the benefit of weight loss to improve his health. He was advised of the need for long term treatment and the importance of lifestyle modifications.  AGREE: Multiple dietary modification options and treatment options were discussed and  Linc agreed to keep a food journal with 500 to 700 calories and 40+ grams of protein at supper daily and follow the Category 3 plan We discussed the following Behavioral Modification Strategies today: work on meal planning and easy cooking plans, travel eating strategies and keep a strict food journal  I, Doreene Nest, am acting as transcriptionist for Dennard Nip, MD  I have reviewed the above documentation for accuracy and completeness, and I agree with the above. -Dennard Nip, MD

## 2017-12-21 ENCOUNTER — Ambulatory Visit (INDEPENDENT_AMBULATORY_CARE_PROVIDER_SITE_OTHER): Payer: BC Managed Care – PPO | Admitting: Family Medicine

## 2017-12-21 ENCOUNTER — Encounter (INDEPENDENT_AMBULATORY_CARE_PROVIDER_SITE_OTHER): Payer: Self-pay | Admitting: Family Medicine

## 2017-12-21 VITALS — BP 144/81 | HR 65 | Temp 98.1°F | Ht 71.0 in | Wt 314.0 lb

## 2017-12-21 DIAGNOSIS — I10 Essential (primary) hypertension: Secondary | ICD-10-CM

## 2017-12-21 DIAGNOSIS — Z6841 Body Mass Index (BMI) 40.0 and over, adult: Secondary | ICD-10-CM

## 2017-12-21 NOTE — Progress Notes (Signed)
Office: [phone]  /  Fax: [phone]   HPI:   Chief Complaint: OBESITY Bryan [name] is here to discuss his progress with his obesity treatment plan. He is on the keep a food journal with 500 to 700 calories at supper daily and 40+ grams of protein daily and the Category 3 plan and is following his eating plan approximately 80 % of the time. He states he is doing yoga and walking for 45 minutes 3 times per week. Brave continues to do well with weight loss. He has struggled to journal his dinner. He increased eating out while traveling, but did well being mindful and trying to increase lean protein and vegetables. His weight is (!) 314 lb (142.4 kg) today and has had a weight loss of 2 pounds over a period of 2 weeks since his last visit. He has lost 12 lbs since starting treatment with [place].  Hypertension Bryan [name] is a 57 y.o. male with hypertension. His blood pressure is elevated again today. [name] s on HCTZ, but he has increased eating out with increased travel and likely has increased his sodium intake. Bryan [name] denies chest pain or shortness of breath on exertion. He is working weight loss to help control his blood pressure with the goal of decreasing his risk of heart attack and stroke. Bryan [name] blood pressure is not currently controlled.  ALLERGIES: No Known Allergies  MEDICATIONS: Current Outpatient Medications on File Prior to Visit  Medication Sig Dispense Refill  . aspirin 81 MG tablet Take 81 mg by mouth daily.    [name] atorvastatin (LIPITOR) 20 MG tablet Take 1 tablet (20 mg total) by mouth at bedtime. 90 tablet 3  . azelastine (ASTELIN) 0.1 % nasal spray Place 2 sprays into both nostrils at bedtime as needed for rhinitis. Use in each nostril as directed    . CO ENZYME Q-10 PO Take by mouth daily.    . fish oil-omega-3 fatty acids 1000 MG capsule Take 1 g by mouth daily.    . hydrochlorothiazide (HYDRODIURIL) 25 MG tablet Take 1 tablet (25 mg total) by mouth daily.  30 tablet 0  . Krill Oil 500 MG CAPS Take 500 mg by mouth daily.    . Melatonin 1 MG CAPS Take 1 mg by mouth at bedtime as needed.     . metFORMIN (GLUCOPHAGE) 500 MG tablet Take 1 tablet (500 mg total) by mouth 2 (two) times daily with a meal. 60 tablet 0  . Multiple Vitamins-Minerals (CENTRUM PO) Take 1 tablet by mouth daily.    . Turmeric 500 MG CAPS Take 500 mg by mouth daily.    Cristino Martes Root 450 MG CAPS Take 450 mg by mouth at bedtime as needed.     . Vitamin D, Ergocalciferol, (DRISDOL) 50000 units CAPS capsule Take 1 capsule (50,000 Units total) by mouth every 7 (seven) days. 4 capsule 0   No current facility-administered medications on file prior to visit.     PAST MEDICAL HISTORY: Past Medical History:  Diagnosis Date  . Dental crowns present   . Gallbladder problem   . Hyperlipidemia   . Lower back pain   . Nephrolithiasis 2017  . Prediabetes 09/04/2014  . Seasonal allergies   . Sebaceous cyst 04/2013   upper back  . Sleep apnea    uses CPAP nightly  . Urolithiasis 10/2016    PAST SURGICAL HISTORY: Past Surgical History:  Procedure Laterality Date  . ADENOIDECTOMY     as a child  .  CHOLECYSTECTOMY  01/12/2012   Procedure: LAPAROSCOPIC CHOLECYSTECTOMY WITH INTRAOPERATIVE CHOLANGIOGRAM;  Surgeon: [name], MD;  Location: [facility];  Service: General;  Laterality: N/A;  . CYST EXCISION     cyst removal from back  . EAR CYST EXCISION N/A 05/04/2013   Procedure: Excision subcutaneous mass posterior neck;  Surgeon: Imogene Burn. [name], MD;  Location: [facility];  Service: General;  Laterality: N/A;    SOCIAL HISTORY: Social History   Tobacco Use  . Smoking status: Never Smoker  . Smokeless tobacco: Never Used  Substance Use Topics  . Alcohol use: Yes    Comment: 1 glass wine every other day  . Drug use: No    FAMILY HISTORY: Family History  Problem Relation Age of Onset  . Cancer Mother        ovarian  . Hypertension Mother   .  Hyperlipidemia Mother   . Obesity Mother   . Cancer Brother        leukemia  . Diabetes Brother   . Colon cancer Maternal Aunt 13  . Cancer Paternal Grandmother        breast  . Cancer Father 76       renal  . Kidney disease Father   . Sleep apnea Father   . Prostate cancer Neg Hx   . Esophageal cancer Neg Hx   . Rectal cancer Neg Hx   . Stomach cancer Neg Hx   . CAD Neg Hx     ROS: Review of Systems  Constitutional: Positive for weight loss.  Respiratory: Negative for shortness of breath (on exertion).   Cardiovascular: Negative for chest pain.    PHYSICAL EXAM: Blood pressure (!) 144/81, pulse 65, temperature 98.1 F (36.7 C), temperature source Oral, height 5\' 11"  (1.803 m), weight (!) 314 lb (142.4 kg), SpO2 97 %. Body mass index is 43.79 kg/m. Physical Exam  Constitutional: He is oriented to person, place, and time. He appears well-developed and well-nourished.  Cardiovascular: Normal rate.  Pulmonary/Chest: Effort normal.  Musculoskeletal: Normal range of motion.  Neurological: He is oriented to person, place, and time.  Skin: Skin is warm and dry.  Psychiatric: He has a normal mood and affect. His behavior is normal.  Vitals reviewed.   RECENT LABS AND TESTS: BMET    Component Value Date/Time   NA 137 09/08/2017 1144   K 4.1 09/08/2017 1144   CL 101 09/08/2017 1144   CO2 23 09/08/2017 1144   GLUCOSE 86 09/08/2017 1144   GLUCOSE 104 (H) 06/23/2017 1028   BUN 14 09/08/2017 1144   CREATININE 0.75 (L) 09/08/2017 1144   CALCIUM 9.3 09/08/2017 1144   GFRNONAA 104 09/08/2017 1144   GFRAA 120 09/08/2017 1144   Lab Results  Component Value Date   HGBA1C 5.9 (H) 09/08/2017   HGBA1C 5.9 06/23/2017   HGBA1C 6.0 11/16/2016   HGBA1C 6.0 09/04/2014   HGBA1C 6.1 03/02/2014   Lab Results  Component Value Date   INSULIN 21.9 09/08/2017   CBC    Component Value Date/Time   WBC 7.8 09/08/2017 1144   WBC 5.9 06/23/2017 1028   RBC 5.15 09/08/2017 1144    RBC 5.03 06/23/2017 1028   HGB 14.7 09/08/2017 1144   HCT 43.8 09/08/2017 1144   PLT 253.0 06/23/2017 1028   MCV 85 09/08/2017 1144   MCH 28.5 09/08/2017 1144   MCH 28.8 01/13/2012 0328   MCHC 33.6 09/08/2017 1144   MCHC 32.9 06/23/2017 1028   RDW  13.8 09/08/2017 1144   LYMPHSABS 2.2 09/08/2017 1144   MONOABS 0.4 06/23/2017 1028   EOSABS 0.2 09/08/2017 1144   BASOSABS 0.1 09/08/2017 1144   Iron/TIBC/Ferritin/ %Sat No results found for: IRON, TIBC, FERRITIN, IRONPCTSAT Lipid Panel     Component Value Date/Time   CHOL 151 09/08/2017 1144   TRIG 175 (H) 09/08/2017 1144   HDL 44 09/08/2017 1144   CHOLHDL 3 06/23/2017 1028   VLDL 26.2 06/23/2017 1028   LDLCALC 72 09/08/2017 1144   LDLDIRECT 136.0 11/16/2016 0902   Hepatic Function Panel     Component Value Date/Time   PROT 7.3 09/08/2017 1144   ALBUMIN 4.3 09/08/2017 1144   AST 16 09/08/2017 1144   ALT 23 09/08/2017 1144   ALKPHOS 49 09/08/2017 1144   BILITOT 0.4 09/08/2017 1144   BILIDIR 0.0 02/26/2012 1353      Component Value Date/Time   TSH 1.400 09/08/2017 1144   TSH 1.31 06/23/2017 1028   TSH 1.61 03/02/2014 0847    ASSESSMENT AND PLAN: Essential hypertension  Class 3 severe obesity with serious comorbidity and body mass index (BMI) of 40.0 to 44.9 in adult, unspecified obesity type (HCC)  PLAN:  Hypertension We discussed sodium restriction, working on healthy weight loss, and a regular exercise program as the means to achieve improved blood pressure control. Hassaan agreed with this plan and agreed to follow up as directed. We will recheck blood pressure in 2 weeks and may need to change to chlorthalidone or add lisinopril. We will continue to monitor his blood pressure as well as his progress with the above lifestyle modifications. He will continue HCTZ as prescribed and will watch for signs of hypotension as he continues his lifestyle modifications.  We spent > than 50% of the 15 minute visit on the  counseling as documented in the note.  Obesity Gustavus is currently in the action stage of change. As such, his goal is to continue with weight loss efforts He has agreed to keep a food journal with 500 to 700 calories and 40+ grams of protein at supper daily and follow the Category 3 plan Adriann has been instructed to work up to a goal of 150 minutes of combined cardio and strengthening exercise per week for weight loss and overall health benefits. We discussed the following Behavioral Modification Strategies today: increasing lean protein intake, decreasing simple carbohydrates  and decreasing sodium intake  Navy has agreed to follow up with our clinic in 2 weeks. He was informed of the importance of frequent follow up visits to maximize his success with intensive lifestyle modifications for his multiple health conditions.   OBESITY BEHAVIORAL INTERVENTION VISIT  Today's visit was # 8 out of 22.  Starting weight: 326 lbs Starting date: 09/08/17 Today's weight : 314 lbs Today's date: 12/21/2017 Total lbs lost to date: 12 (Patients must lose 7 lbs in the first 6 months to continue with counseling)   ASK: We discussed the diagnosis of obesity with Bryan [name] today and Donnovan agreed to give [place] permission to discuss obesity behavioral modification therapy today.  ASSESS: Malik has the diagnosis of obesity and his BMI today is 43.81 Chayse is in the action stage of change   ADVISE: Tatsuya was educated on the multiple health risks of obesity as well as the benefit of weight loss to improve his health. He was advised of the need for long term treatment and the importance of lifestyle modifications.  AGREE: Multiple dietary modification options and treatment  options were discussed and  Christon agreed to the above obesity treatment plan.  I, Doreene Nest, am acting as transcriptionist for Dennard Nip, MD  I have reviewed the above documentation for accuracy and  completeness, and I agree with the above. -Dennard Nip, MD

## 2018-01-05 ENCOUNTER — Encounter (INDEPENDENT_AMBULATORY_CARE_PROVIDER_SITE_OTHER): Payer: Self-pay | Admitting: Family Medicine

## 2018-01-05 ENCOUNTER — Ambulatory Visit (INDEPENDENT_AMBULATORY_CARE_PROVIDER_SITE_OTHER): Payer: BC Managed Care – PPO | Admitting: Family Medicine

## 2018-01-05 VITALS — BP 142/85 | HR 71 | Temp 98.1°F | Ht 71.0 in | Wt 315.0 lb

## 2018-01-05 DIAGNOSIS — Z6841 Body Mass Index (BMI) 40.0 and over, adult: Secondary | ICD-10-CM | POA: Diagnosis not present

## 2018-01-05 DIAGNOSIS — Z9189 Other specified personal risk factors, not elsewhere classified: Secondary | ICD-10-CM | POA: Diagnosis not present

## 2018-01-05 DIAGNOSIS — E559 Vitamin D deficiency, unspecified: Secondary | ICD-10-CM | POA: Diagnosis not present

## 2018-01-05 DIAGNOSIS — I10 Essential (primary) hypertension: Secondary | ICD-10-CM | POA: Diagnosis not present

## 2018-01-05 MED ORDER — VITAMIN D (ERGOCALCIFEROL) 1.25 MG (50000 UNIT) PO CAPS: 50000.0000 [IU] | ORAL_CAPSULE | ORAL | 0 refills | Status: DC

## 2018-01-05 MED ORDER — HYDROCHLOROTHIAZIDE 25 MG PO TABS: 25.0000 mg | ORAL_TABLET | Freq: Every day | ORAL | 0 refills | Status: DC

## 2018-01-05 NOTE — Progress Notes (Signed)
Office: [phone]  /  Fax: [phone]   HPI:   Chief Complaint: OBESITY Bryan [name] is here to discuss his progress with his obesity treatment plan. He is on the keep a food journal with 500 to 700 calories and 40+ grams of protein at supper daily and the Category 3 plan and is following his eating plan approximately 70 % of the time. He states he is walking and doing yoga for 30 minutes 3 times per week. Bryan [name] traveled 3 times in the last 3 weeks. He could adhere to breakfast easily, but found lunch to be the hardest to follow. He tried to make better choices while traveling. His weight is (!) 315 lb (142.9 kg) today and has had a weight gain of 1 pound over a period of 2 weeks since his last visit. He has lost 11 lbs since starting treatment with [place].  Hypertension Bryan [name] is a 57 y.o. male with hypertension. His blood pressure is slightly elevated this morning at 142/85. Bryan [name] denies chest pain or shortness of breath on exertion. He is working weight loss to help control his blood pressure with the goal of decreasing his risk of heart attack and stroke. Bryan [name] blood pressure is not currently controlled.  At risk for cardiovascular disease Bryan [name] is at a higher than average risk for cardiovascular disease due to obesity and hypertension. He currently denies any chest pain.  Vitamin D deficiency Bryan [name] has a diagnosis of vitamin D deficiency. He is currently taking vit D and denies nausea, vomiting or muscle weakness.   Ref. Range 09/08/2017 11:44  Vitamin D, 25-Hydroxy Latest Ref Range: 30.0 - 100.0 ng/mL 29.7 (L)   ALLERGIES: No Known Allergies  MEDICATIONS: Current Outpatient Medications on File Prior to Visit  Medication Sig Dispense Refill  . aspirin 81 MG tablet Take 81 mg by mouth daily.    [name] atorvastatin (LIPITOR) 20 MG tablet Take 1 tablet (20 mg total) by mouth at bedtime. 90 tablet 3  . azelastine (ASTELIN) 0.1 % nasal spray Place 2 sprays into  both nostrils at bedtime as needed for rhinitis. Use in each nostril as directed    . CO ENZYME Q-10 PO Take by mouth daily.    . fish oil-omega-3 fatty acids 1000 MG capsule Take 1 g by mouth daily.    . hydrochlorothiazide (HYDRODIURIL) 25 MG tablet Take 1 tablet (25 mg total) by mouth daily. 30 tablet 0  . Krill Oil 500 MG CAPS Take 500 mg by mouth daily.    . Melatonin 1 MG CAPS Take 1 mg by mouth at bedtime as needed.     . metFORMIN (GLUCOPHAGE) 500 MG tablet Take 1 tablet (500 mg total) by mouth 2 (two) times daily with a meal. 60 tablet 0  . Multiple Vitamins-Minerals (CENTRUM PO) Take 1 tablet by mouth daily.    . Turmeric 500 MG CAPS Take 500 mg by mouth daily.    Bryan [name] Root 450 MG CAPS Take 450 mg by mouth at bedtime as needed.     . Vitamin D, Ergocalciferol, (DRISDOL) 50000 units CAPS capsule Take 1 capsule (50,000 Units total) by mouth every 7 (seven) days. 4 capsule 0   No current facility-administered medications on file prior to visit.     PAST MEDICAL HISTORY: Past Medical History:  Diagnosis Date  . Dental crowns present   . Gallbladder problem   . Hyperlipidemia   . Lower back pain   . Nephrolithiasis 2017  .  Prediabetes 09/04/2014  . Seasonal allergies   . Sebaceous cyst 04/2013   upper back  . Sleep apnea    uses CPAP nightly  . Urolithiasis 10/2016    PAST SURGICAL HISTORY: Past Surgical History:  Procedure Laterality Date  . ADENOIDECTOMY     as a child  . CHOLECYSTECTOMY  01/12/2012   Procedure: LAPAROSCOPIC CHOLECYSTECTOMY WITH INTRAOPERATIVE CHOLANGIOGRAM;  Surgeon: [name], MD;  Location: [facility];  Service: General;  Laterality: N/A;  . CYST EXCISION     cyst removal from back  . EAR CYST EXCISION N/A 05/04/2013   Procedure: Excision subcutaneous mass posterior neck;  Surgeon: Imogene Burn. [name], MD;  Location: [facility];  Service: General;  Laterality: N/A;    SOCIAL HISTORY: Social History   Tobacco Use  . Smoking  status: Never Smoker  . Smokeless tobacco: Never Used  Substance Use Topics  . Alcohol use: Yes    Comment: 1 glass wine every other day  . Drug use: No    FAMILY HISTORY: Family History  Problem Relation Age of Onset  . Cancer Mother        ovarian  . Hypertension Mother   . Hyperlipidemia Mother   . Obesity Mother   . Cancer Brother        leukemia  . Diabetes Brother   . Colon cancer Maternal Aunt 61  . Cancer Paternal Grandmother        breast  . Cancer Father 53       renal  . Kidney disease Father   . Sleep apnea Father   . Prostate cancer Neg Hx   . Esophageal cancer Neg Hx   . Rectal cancer Neg Hx   . Stomach cancer Neg Hx   . CAD Neg Hx     ROS: Review of Systems  Constitutional: Negative for weight loss.  Respiratory: Negative for shortness of breath (on exertion).   Cardiovascular: Negative for chest pain.  Gastrointestinal: Negative for nausea and vomiting.  Musculoskeletal:       Negative muscle weakness    PHYSICAL EXAM: Blood pressure (!) 142/85, pulse 71, temperature 98.1 F (36.7 C), temperature source Oral, height 5\' 11"  (1.803 m), weight (!) 315 lb (142.9 kg), SpO2 98 %. Body mass index is 43.93 kg/m. Physical Exam  Constitutional: He is oriented to person, place, and time. He appears well-developed and well-nourished.  Cardiovascular: Normal rate.  Pulmonary/Chest: Effort normal.  Musculoskeletal: Normal range of motion.  Neurological: He is oriented to person, place, and time.  Skin: Skin is warm and dry.  Psychiatric: He has a normal mood and affect. His behavior is normal.  Vitals reviewed.   RECENT LABS AND TESTS: BMET    Component Value Date/Time   NA 137 09/08/2017 1144   K 4.1 09/08/2017 1144   CL 101 09/08/2017 1144   CO2 23 09/08/2017 1144   GLUCOSE 86 09/08/2017 1144   GLUCOSE 104 (H) 06/23/2017 1028   BUN 14 09/08/2017 1144   CREATININE 0.75 (L) 09/08/2017 1144   CALCIUM 9.3 09/08/2017 1144   GFRNONAA 104  09/08/2017 1144   GFRAA 120 09/08/2017 1144   Lab Results  Component Value Date   HGBA1C 5.9 (H) 09/08/2017   HGBA1C 5.9 06/23/2017   HGBA1C 6.0 11/16/2016   HGBA1C 6.0 09/04/2014   HGBA1C 6.1 03/02/2014   Lab Results  Component Value Date   INSULIN 21.9 09/08/2017   CBC    Component Value Date/Time  WBC 7.8 09/08/2017 1144   WBC 5.9 06/23/2017 1028   RBC 5.15 09/08/2017 1144   RBC 5.03 06/23/2017 1028   HGB 14.7 09/08/2017 1144   HCT 43.8 09/08/2017 1144   PLT 253.0 06/23/2017 1028   MCV 85 09/08/2017 1144   MCH 28.5 09/08/2017 1144   MCH 28.8 01/13/2012 0328   MCHC 33.6 09/08/2017 1144   MCHC 32.9 06/23/2017 1028   RDW 13.8 09/08/2017 1144   LYMPHSABS 2.2 09/08/2017 1144   MONOABS 0.4 06/23/2017 1028   EOSABS 0.2 09/08/2017 1144   BASOSABS 0.1 09/08/2017 1144   Iron/TIBC/Ferritin/ %Sat No results found for: IRON, TIBC, FERRITIN, IRONPCTSAT Lipid Panel     Component Value Date/Time   CHOL 151 09/08/2017 1144   TRIG 175 (H) 09/08/2017 1144   HDL 44 09/08/2017 1144   CHOLHDL 3 06/23/2017 1028   VLDL 26.2 06/23/2017 1028   LDLCALC 72 09/08/2017 1144   LDLDIRECT 136.0 11/16/2016 0902   Hepatic Function Panel     Component Value Date/Time   PROT 7.3 09/08/2017 1144   ALBUMIN 4.3 09/08/2017 1144   AST 16 09/08/2017 1144   ALT 23 09/08/2017 1144   ALKPHOS 49 09/08/2017 1144   BILITOT 0.4 09/08/2017 1144   BILIDIR 0.0 02/26/2012 1353      Component Value Date/Time   TSH 1.400 09/08/2017 1144   TSH 1.31 06/23/2017 1028   TSH 1.61 03/02/2014 0847     Ref. Range 09/08/2017 11:44  Vitamin D, 25-Hydroxy Latest Ref Range: 30.0 - 100.0 ng/mL 29.7 (L)   ASSESSMENT AND PLAN: Essential hypertension - Plan: Comprehensive metabolic panel, Hemoglobin A1c, Insulin, random, Lipid Panel With LDL/HDL Ratio, hydrochlorothiazide (HYDRODIURIL) 25 MG tablet  Vitamin D deficiency - Plan: VITAMIN D 25 Hydroxy (Vit-D Deficiency, Fractures), Vitamin D, Ergocalciferol,  (DRISDOL) 50000 units CAPS capsule  At risk for heart disease  Class 3 severe obesity with serious comorbidity and body mass index (BMI) of 40.0 to 44.9 in adult, unspecified obesity type (HCC)  PLAN:  Hypertension We discussed sodium restriction, working on healthy weight loss, and a regular exercise program as the means to achieve improved blood pressure control. Bryan [name] agreed with this plan and agreed to follow up as directed. We will continue to monitor his blood pressure as well as his progress with the above lifestyle modifications. He agrees to continue HCTZ 25 mg qd #30 with no refills and check blood pressure 3 times per week for the next 2 weeks. We will consider increasing the dose of HCTZ or changing to chlorthalidone at the next visit if his blood pressure is still elevated. Bryan [name] will watch for signs of hypotension as he continues his lifestyle modifications.  Cardiovascular risk counseling Bryan [name] was given extended (15 minutes) coronary artery disease prevention counseling today. He is 57 y.o. male and has risk factors for heart disease including obesity and hypertension. We discussed intensive lifestyle modifications today with an emphasis on specific weight loss instructions and strategies. Pt was also informed of the importance of increasing exercise and decreasing saturated fats to help prevent heart disease.  Vitamin D Deficiency Bryan [name] was informed that low vitamin D levels contributes to fatigue and are associated with obesity, breast, and colon cancer. He agrees to continue to take prescription Vit D @50 ,000 IU every week #4 with no refills. We will recheck labs today and will follow up for routine testing of vitamin D, at least 2-3 times per year. He was informed of the risk of over-replacement of vitamin D  and agrees to not increase his dose unless he discusses this with [place] first. Flora agrees to follow up with our clinic in 2 weeks.  Obesity Bryan [name] is currently in  the action stage of change. As such, his goal is to continue with weight loss efforts He has agreed to follow the Category 3 plan Bryan [name] has been instructed to work up to a goal of 150 minutes of combined cardio and strengthening exercise per week for weight loss and overall health benefits. We discussed the following Behavioral Modification Strategies today: better snacking choices, increasing lean protein intake and work on meal planning and easy cooking plans  Bram has agreed to follow up with our clinic in 2 weeks. He was informed of the importance of frequent follow up visits to maximize his success with intensive lifestyle modifications for his multiple health conditions.   OBESITY BEHAVIORAL INTERVENTION VISIT  Today's visit was # 9 out of 22.  Starting weight: 326 lbs Starting date: 09/08/17 Today's weight : 315 lbs Today's date: 01/05/2018 Total lbs lost to date: 11 (Patients must lose 7 lbs in the first 6 months to continue with counseling)   ASK: We discussed the diagnosis of obesity with Bryan [name] today and Reed agreed to give [place] permission to discuss obesity behavioral modification therapy today.  ASSESS: Hairo has the diagnosis of obesity and his BMI today is 43.95 Marquist is in the action stage of change   ADVISE: Alf was educated on the multiple health risks of obesity as well as the benefit of weight loss to improve his health. He was advised of the need for long term treatment and the importance of lifestyle modifications.  AGREE: Multiple dietary modification options and treatment options were discussed and  Sidharth agreed to the above obesity treatment plan.  I, Doreene Nest, am acting as transcriptionist for Dennard Nip, MD  I have reviewed the above documentation for accuracy and completeness, and I agree with the above. -Dennard Nip, MD

## 2018-01-06 LAB — LIPID PANEL WITH LDL/HDL RATIO
Cholesterol, Total: 144 mg/dL (ref 100–199)
HDL: 41 mg/dL (ref >39)
LDL CALC: 71 mg/dL (ref 0–99)
LDL/HDL RATIO: 1.7 ratio (ref 0.0–3.6)
Triglycerides: 162 mg/dL — ABNORMAL HIGH (ref 0–149)
VLDL CHOLESTEROL CAL: 32 mg/dL (ref 5–40)

## 2018-01-06 LAB — COMPREHENSIVE METABOLIC PANEL
A/G RATIO: 1.5 (ref 1.2–2.2)
ALK PHOS: 49 IU/L (ref 39–117)
ALT: 43 IU/L (ref 0–44)
AST: 27 IU/L (ref 0–40)
Albumin: 4.3 g/dL (ref 3.5–5.5)
BUN/Creatinine Ratio: 20 (ref 9–20)
BUN: 18 mg/dL (ref 6–24)
Bilirubin Total: 0.4 mg/dL (ref 0.0–1.2)
CHLORIDE: 100 mmol/L (ref 96–106)
CO2: 22 mmol/L (ref 20–29)
Calcium: 9.3 mg/dL (ref 8.7–10.2)
Creatinine, Ser: 0.88 mg/dL (ref 0.76–1.27)
GFR calc Af Amer: 113 mL/min/{1.73_m2} (ref >59)
GFR calc non Af Amer: 97 mL/min/{1.73_m2} (ref >59)
GLOBULIN, TOTAL: 2.8 g/dL (ref 1.5–4.5)
Glucose: 110 mg/dL — ABNORMAL HIGH (ref 65–99)
Potassium: 4.3 mmol/L (ref 3.5–5.2)
SODIUM: 140 mmol/L (ref 134–144)
Total Protein: 7.1 g/dL (ref 6.0–8.5)

## 2018-01-06 LAB — VITAMIN D 25 HYDROXY (VIT D DEFICIENCY, FRACTURES): VIT D 25 HYDROXY: 38.6 ng/mL (ref 30.0–100.0)

## 2018-01-06 LAB — HEMOGLOBIN A1C
Est. average glucose Bld gHb Est-mCnc: 123 mg/dL
Hgb A1c MFr Bld: 5.9 % — ABNORMAL HIGH (ref 4.8–5.6)

## 2018-01-06 LAB — INSULIN, RANDOM: INSULIN: 19.4 u[IU]/mL (ref 2.6–24.9)

## 2018-01-19 ENCOUNTER — Ambulatory Visit (INDEPENDENT_AMBULATORY_CARE_PROVIDER_SITE_OTHER): Payer: BC Managed Care – PPO | Admitting: Family Medicine

## 2018-01-20 ENCOUNTER — Encounter (INDEPENDENT_AMBULATORY_CARE_PROVIDER_SITE_OTHER): Payer: Self-pay | Admitting: Family Medicine

## 2018-01-20 ENCOUNTER — Ambulatory Visit (INDEPENDENT_AMBULATORY_CARE_PROVIDER_SITE_OTHER): Payer: BC Managed Care – PPO | Admitting: Family Medicine

## 2018-01-20 VITALS — BP 127/81 | HR 88 | Temp 97.9°F | Ht 71.0 in | Wt 316.0 lb

## 2018-01-20 DIAGNOSIS — E559 Vitamin D deficiency, unspecified: Secondary | ICD-10-CM | POA: Diagnosis not present

## 2018-01-20 DIAGNOSIS — Z6841 Body Mass Index (BMI) 40.0 and over, adult: Secondary | ICD-10-CM | POA: Diagnosis not present

## 2018-01-20 DIAGNOSIS — I10 Essential (primary) hypertension: Secondary | ICD-10-CM | POA: Diagnosis not present

## 2018-01-20 DIAGNOSIS — Z9189 Other specified personal risk factors, not elsewhere classified: Secondary | ICD-10-CM

## 2018-01-20 DIAGNOSIS — R7303 Prediabetes: Secondary | ICD-10-CM

## 2018-01-20 MED ORDER — METFORMIN HCL 500 MG PO TABS: 500.0000 mg | ORAL_TABLET | Freq: Two times a day (BID) | ORAL | 0 refills | Status: DC

## 2018-01-20 MED ORDER — HYDROCHLOROTHIAZIDE 25 MG PO TABS: 25.0000 mg | ORAL_TABLET | Freq: Every day | ORAL | 0 refills | Status: DC

## 2018-01-20 MED ORDER — VITAMIN D (ERGOCALCIFEROL) 1.25 MG (50000 UNIT) PO CAPS: 50000.0000 [IU] | ORAL_CAPSULE | ORAL | 0 refills | Status: DC

## 2018-01-24 NOTE — Progress Notes (Signed)
Office: [phone]  /  Fax: [phone]   HPI:   Chief Complaint: OBESITY Bryan [name] is here to discuss his progress with his obesity treatment plan. He is on the Category 3 plan and is following his eating plan approximately 75 % of the time. He states he is walking and doing yoga 30 minutes 2 times per week. Bryan [name] is doing well and is mostly maintaining his weight. Bryan [name] is retaining some fluid today, but he has hit a plateau. Bryan [name] deviates most from his dinner. His weight is (!) 316 lb (143.3 kg) today and has had a weight gain of 1 pound over a period of 2 weeks since his last visit. He has lost 10 lbs since starting treatment with [place].  Pre-Diabetes Bryan [name] has a diagnosis of pre-diabetes based on his elevated Hgb A1c and was informed this puts him at greater risk of developing diabetes. He is stable on metformin currently and continues to work on diet,exercise and weight loss to decrease risk of diabetes. He denies nausea, vomiting or hypoglycemia.  At risk for diabetes Bryan [name] is at higher than average risk for developing diabetes due to his obesity and pre-diabetes. He currently denies polyuria or polydipsia.  Vitamin D deficiency Bryan [name] has a diagnosis of vitamin D deficiency. He is stable on vit D and is not yet at goal. Bryan [name] denies nausea, vomiting or muscle weakness.   Ref. Range 01/05/2018 08:48  Vitamin D, 25-Hydroxy Latest Ref Range: 30.0 - 100.0 ng/mL 38.6   Hypertension Bryan [name] is a 57 y.o. male with hypertension.  Bryan [name] denies chest pain or headache. He is doing better with diet and is working weight loss to help control his blood pressure with the goal of decreasing his risk of heart attack and stroke. Bryan [name] blood pressure is stable.  ALLERGIES: No Known Allergies  MEDICATIONS: Current Outpatient Medications on File Prior to Visit  Medication Sig Dispense Refill  . aspirin 81 MG tablet Take 81 mg by mouth daily.    [name] atorvastatin  (LIPITOR) 20 MG tablet Take 1 tablet (20 mg total) by mouth at bedtime. 90 tablet 3  . azelastine (ASTELIN) 0.1 % nasal spray Place 2 sprays into both nostrils at bedtime as needed for rhinitis. Use in each nostril as directed    . CO ENZYME Q-10 PO Take by mouth daily.    . fish oil-omega-3 fatty acids 1000 MG capsule Take 1 g by mouth daily.    Javier Docker Oil 500 MG CAPS Take 500 mg by mouth daily.    . Melatonin 1 MG CAPS Take 1 mg by mouth at bedtime as needed.     . Multiple Vitamins-Minerals (CENTRUM PO) Take 1 tablet by mouth daily.    . Turmeric 500 MG CAPS Take 500 mg by mouth daily.    Cristino Martes Root 450 MG CAPS Take 450 mg by mouth at bedtime as needed.      No current facility-administered medications on file prior to visit.     PAST MEDICAL HISTORY: Past Medical History:  Diagnosis Date  . Dental crowns present   . Gallbladder problem   . Hyperlipidemia   . Lower back pain   . Nephrolithiasis 2017  . Prediabetes 09/04/2014  . Seasonal allergies   . Sebaceous cyst 04/2013   upper back  . Sleep apnea    uses CPAP nightly  . Urolithiasis 10/2016    PAST SURGICAL HISTORY: Past Surgical History:  Procedure Laterality Date  . ADENOIDECTOMY  as a child  . CHOLECYSTECTOMY  01/12/2012   Procedure: LAPAROSCOPIC CHOLECYSTECTOMY WITH INTRAOPERATIVE CHOLANGIOGRAM;  Surgeon: [name], MD;  Location: [facility];  Service: General;  Laterality: N/A;  . CYST EXCISION     cyst removal from back  . EAR CYST EXCISION N/A 05/04/2013   Procedure: Excision subcutaneous mass posterior neck;  Surgeon: Imogene Burn. [name], MD;  Location: [facility];  Service: General;  Laterality: N/A;    SOCIAL HISTORY: Social History   Tobacco Use  . Smoking status: Never Smoker  . Smokeless tobacco: Never Used  Substance Use Topics  . Alcohol use: Yes    Comment: 1 glass wine every other day  . Drug use: No    FAMILY HISTORY: Family History  Problem Relation Age of Onset  .  Cancer Mother        ovarian  . Hypertension Mother   . Hyperlipidemia Mother   . Obesity Mother   . Cancer Brother        leukemia  . Diabetes Brother   . Colon cancer Maternal Aunt 95  . Cancer Paternal Grandmother        breast  . Cancer Father 12       renal  . Kidney disease Father   . Sleep apnea Father   . Prostate cancer Neg Hx   . Esophageal cancer Neg Hx   . Rectal cancer Neg Hx   . Stomach cancer Neg Hx   . CAD Neg Hx     ROS: Review of Systems  Constitutional: Negative for weight loss.  Cardiovascular: Negative for chest pain.  Gastrointestinal: Negative for nausea and vomiting.  Genitourinary: Negative for frequency.  Musculoskeletal:       Negative for muscle weakness  Neurological: Negative for headaches.  Endo/Heme/Allergies: Negative for polydipsia.       Negative for hypoglycemia    PHYSICAL EXAM: Blood pressure 127/81, pulse 88, temperature 97.9 F (36.6 C), temperature source Oral, height 5\' 11"  (1.803 m), weight (!) 316 lb (143.3 kg), SpO2 95 %. Body mass index is 44.07 kg/m. Physical Exam  Constitutional: He is oriented to person, place, and time. He appears well-developed and well-nourished.  Cardiovascular: Normal rate.  Pulmonary/Chest: Effort normal.  Musculoskeletal: Normal range of motion.  Neurological: He is oriented to person, place, and time.  Skin: Skin is warm and dry.  Psychiatric: He has a normal mood and affect. His behavior is normal.  Vitals reviewed.   RECENT LABS AND TESTS: BMET    Component Value Date/Time   NA 140 01/05/2018 0848   K 4.3 01/05/2018 0848   CL 100 01/05/2018 0848   CO2 22 01/05/2018 0848   GLUCOSE 110 (H) 01/05/2018 0848   GLUCOSE 104 (H) 06/23/2017 1028   BUN 18 01/05/2018 0848   CREATININE 0.88 01/05/2018 0848   CALCIUM 9.3 01/05/2018 0848   GFRNONAA 97 01/05/2018 0848   GFRAA 113 01/05/2018 0848   Lab Results  Component Value Date   HGBA1C 5.9 (H) 01/05/2018   HGBA1C 5.9 (H) 09/08/2017     HGBA1C 5.9 06/23/2017   HGBA1C 6.0 11/16/2016   HGBA1C 6.0 09/04/2014   Lab Results  Component Value Date   INSULIN 19.4 01/05/2018   INSULIN 21.9 09/08/2017   CBC    Component Value Date/Time   WBC 7.8 09/08/2017 1144   WBC 5.9 06/23/2017 1028   RBC 5.15 09/08/2017 1144   RBC 5.03 06/23/2017 1028   HGB 14.7 09/08/2017 1144  HCT 43.8 09/08/2017 1144   PLT 253.0 06/23/2017 1028   MCV 85 09/08/2017 1144   MCH 28.5 09/08/2017 1144   MCH 28.8 01/13/2012 0328   MCHC 33.6 09/08/2017 1144   MCHC 32.9 06/23/2017 1028   RDW 13.8 09/08/2017 1144   LYMPHSABS 2.2 09/08/2017 1144   MONOABS 0.4 06/23/2017 1028   EOSABS 0.2 09/08/2017 1144   BASOSABS 0.1 09/08/2017 1144   Iron/TIBC/Ferritin/ %Sat No results found for: IRON, TIBC, FERRITIN, IRONPCTSAT Lipid Panel     Component Value Date/Time   CHOL 144 01/05/2018 0848   TRIG 162 (H) 01/05/2018 0848   HDL 41 01/05/2018 0848   CHOLHDL 3 06/23/2017 1028   VLDL 26.2 06/23/2017 1028   LDLCALC 71 01/05/2018 0848   LDLDIRECT 136.0 11/16/2016 0902   Hepatic Function Panel     Component Value Date/Time   PROT 7.1 01/05/2018 0848   ALBUMIN 4.3 01/05/2018 0848   AST 27 01/05/2018 0848   ALT 43 01/05/2018 0848   ALKPHOS 49 01/05/2018 0848   BILITOT 0.4 01/05/2018 0848   BILIDIR 0.0 02/26/2012 1353      Component Value Date/Time   TSH 1.400 09/08/2017 1144   TSH 1.31 06/23/2017 1028   TSH 1.61 03/02/2014 0847     Ref. Range 01/05/2018 08:48  Vitamin D, 25-Hydroxy Latest Ref Range: 30.0 - 100.0 ng/mL 38.6   ASSESSMENT AND PLAN: Prediabetes - Plan: metFORMIN (GLUCOPHAGE) 500 MG tablet  Vitamin D deficiency - Plan: Vitamin D, Ergocalciferol, (DRISDOL) 50000 units CAPS capsule  Essential hypertension - Plan: hydrochlorothiazide (HYDRODIURIL) 25 MG tablet  At risk for diabetes mellitus  Class 3 severe obesity with serious comorbidity and body mass index (BMI) of 40.0 to 44.9 in adult, unspecified obesity type  (Bryan [name])  PLAN:  Pre-Diabetes Bryan [name] will continue to work on weight loss, exercise, and decreasing simple carbohydrates in his diet to help decrease the risk of diabetes. We dicussed metformin including benefits and risks. He was informed that eating too many simple carbohydrates or too many calories at one sitting increases the likelihood of GI side effects. Bryan [name] requested metformin for now and a prescription was written today for 1 month refill. Bryan [name] agreed to follow up with [place] as directed to monitor his progress.  Diabetes risk counseling Bryan [name] was given extended (15 minutes) diabetes prevention counseling today. He is 57 y.o. male and has risk factors for diabetes including obesity and pre-diabetes. We discussed intensive lifestyle modifications today with an emphasis on weight loss as well as increasing exercise and decreasing simple carbohydrates in his diet.  Vitamin D Deficiency Bryan [name] was informed that low vitamin D levels contributes to fatigue and are associated with obesity, breast, and colon cancer. He agrees to continue to take prescription Vit D @50 ,000 IU every week #4 with no refills and will follow up for routine testing of vitamin D, at least 2-3 times per year. He was informed of the risk of over-replacement of vitamin D and agrees to not increase his dose unless he discusses this with [place] first. Bryan [name] agrees to follow up with our clinic in 4 weeks.  Hypertension We discussed sodium restriction, working on healthy weight loss, and a regular exercise program as the means to achieve improved blood pressure control. Gatlyn agreed with this plan and agreed to follow up as directed. We will continue to monitor his blood pressure as well as his progress with the above lifestyle modifications. He will continue HCTZ 25 mg qd #30 with no refills and will  watch for signs of hypotension as he continues his lifestyle modifications.  Obesity Sadao is currently in the action stage  of change. As such, his goal is to continue with weight loss efforts He has agreed to keep a food journal with 400 to 600 calories and 40 grams of protein at supper daily and follow the Category 3 plan Bryan [name] has been instructed to work up to a goal of 150 minutes of combined cardio and strengthening exercise per week for weight loss and overall health benefits. We discussed the following Behavioral Modification Strategies today: increasing lean protein intake and decreasing simple carbohydrates   Shields has agreed to follow up with our clinic in 4 weeks and with our registered dietician in 2 weeks. He was informed of the importance of frequent follow up visits to maximize his success with intensive lifestyle modifications for his multiple health conditions.   OBESITY BEHAVIORAL INTERVENTION VISIT  Today's visit was # 10 out of 22.  Starting weight: 326 lbs Starting date: 09/08/17 Today's weight : 316 lbs Today's date: 01/20/2018 Total lbs lost to date: 10 (Patients must lose 7 lbs in the first 6 months to continue with counseling)   ASK: We discussed the diagnosis of obesity with Bryan [name] today and Shayan agreed to give [place] permission to discuss obesity behavioral modification therapy today.  ASSESS: Placido has the diagnosis of obesity and his BMI today is 44.09 [name] is in the action stage of change   ADVISE: Wray was educated on the multiple health risks of obesity as well as the benefit of weight loss to improve his health. He was advised of the need for long term treatment and the importance of lifestyle modifications.  AGREE: Multiple dietary modification options and treatment options were discussed and  Delmer agreed to the above obesity treatment plan.  I, Doreene Nest, am acting as transcriptionist for Dennard Nip, MD  I have reviewed the above documentation for accuracy and completeness, and I agree with the above. -Dennard Nip, MD

## 2018-01-25 ENCOUNTER — Encounter: Payer: Self-pay | Admitting: Internal Medicine

## 2018-01-25 MED ORDER — OSELTAMIVIR PHOSPHATE 75 MG PO CAPS: 75.0000 mg | ORAL_CAPSULE | Freq: Every day | ORAL | 0 refills | Status: DC

## 2018-02-01 ENCOUNTER — Encounter: Payer: Self-pay | Admitting: Internal Medicine

## 2018-02-01 DIAGNOSIS — L600 Ingrowing nail: Secondary | ICD-10-CM

## 2018-02-02 ENCOUNTER — Ambulatory Visit: Payer: BC Managed Care – PPO | Admitting: Podiatry

## 2018-02-02 ENCOUNTER — Encounter: Payer: Self-pay | Admitting: Podiatry

## 2018-02-02 VITALS — BP 145/88 | HR 76

## 2018-02-02 DIAGNOSIS — L600 Ingrowing nail: Secondary | ICD-10-CM | POA: Diagnosis not present

## 2018-02-02 DIAGNOSIS — L03031 Cellulitis of right toe: Secondary | ICD-10-CM | POA: Diagnosis not present

## 2018-02-02 NOTE — Telephone Encounter (Signed)
Referral placed.

## 2018-02-02 NOTE — Progress Notes (Signed)
   Subjective:    Patient ID: Bryan [name], male    DOB: Aug 03, 1961, 57 y.o.   MRN: [id number]  HPI this patient presents the office with chief complaint of a painful ingrowing toenail on the outside border the big toe, right foot.  [name] He says he has an ingrown toenail that he usually is able to and he is unable to remove the ingrowing toenail.  [name] He gives a history of having an ingrown toenail, performed as a child but has not had a problem since that visit.  He realized that he is unable to self treat and he made an appointment for the correction of this ingrowing toenail.  Patient is taking metformin for weight control.    Review of Systems  All other systems reviewed and are negative.      Objective:   Physical Exam General Appearance  Alert, conversant and in no acute stress.  Vascular  Dorsalis pedis and posterior pulses are palpable  bilaterally.  Capillary return is within normal limits  bilaterally. Temperature is within normal limits  Bilaterally.  Neurologic  Senn-Weinstein monofilament wire test within normal limits  bilaterally. Muscle power within normal limits bilaterally.  Nails marked incurvation noted along the lateral border right great toenail.  Redness and swelling is noted along the nail groove.  Orthopedic  No limitations of motion of motion feet bilaterally.  No crepitus or effusions noted.  No bony pathology or digital deformities noted.  Skin  normotropic skin with no porokeratosis noted bilaterally.  No signs of infections or ulcers noted.          Assessment & Plan:  Ingrown toenail right hallux   Paronychia right hallux   IE  Nail surgery.  Treatment options and alternatives discussed.  Recommended permanent phenol matrixectomy and patient agreed.  Right hallux  was prepped with alcohol and a toe block of 3cc of 2% lidocaine plain was administered in a digital toe block. .  The toe was then prepped with betadine solution .  The offending nail border was  then excised and matrix tissue exposed.  Phenol was then applied to the matrix tissue followed by an alcohol wash.  Antibiotic ointment and a dry sterile dressing was applied.  The patient was dispensed instructions for aftercare. RTC 1 week.  No antibiotics prescribed.     Gardiner Barefoot DPM

## 2018-02-03 ENCOUNTER — Ambulatory Visit (INDEPENDENT_AMBULATORY_CARE_PROVIDER_SITE_OTHER): Payer: BC Managed Care – PPO | Admitting: Dietician

## 2018-02-03 VITALS — Ht 71.0 in | Wt 317.0 lb

## 2018-02-03 DIAGNOSIS — Z9189 Other specified personal risk factors, not elsewhere classified: Secondary | ICD-10-CM | POA: Diagnosis not present

## 2018-02-03 DIAGNOSIS — R7303 Prediabetes: Secondary | ICD-10-CM

## 2018-02-03 DIAGNOSIS — Z6841 Body Mass Index (BMI) 40.0 and over, adult: Secondary | ICD-10-CM | POA: Diagnosis not present

## 2018-02-07 NOTE — Progress Notes (Signed)
  Office: [phone]  /  Fax: [phone]     Jakyren has a diagnosis of prediabetes based on his elevated HgA1c and was informed this puts him at greater risk of developing diabetes.  Whitt is here today for diabetic risk nutrition counseling which  Includes his obesity treatment plan. His weight today is 317 lbs, a 1 lb weight gain since his last visit. He has had a 9 lb weight loss since beginning treatment with [place]. Arles states his following his meal plan approximately 85% of the time. He is journaling his food intake at dinner, 400-600 calories and 40 g protein and following our category 3 plan for breakfast and lunch. He admits that he has been eating out more and although he feels like he is being more mindful of choices reports eating out and eating healthy is difficult for him. Reviewed  about food nutrients ie protein, fats, simple and complex carbohydrates and how these affect insulin response. Patient reminded that eating out more often is likely to slow his weight loss efforts. He denies polyphagia.   Focus on portion control,  avoiding simple carbohydrates  And increasing lean proteins  for ongoing wt loss efforts and glucose management.  Volney is on the following meal plan: category 3 for breakfast and lunch and journal his dinner meal 400-600 calories and 40+ g protein His meal plan was individualized for maximum benefit.  Also discussed at length the following behavioral modifications to help maximize success: increasing lean protein intake, decreasing simple carbohydrates, increasing vegetables,  decreasing eating out,  keeping healthy foods in the home,  travel eating strategies, keeping a strict food journal.   Spiros has been instructed to work up to a goal of 150 minutes of combined cardio and strengthening exercise per week for weight loss and overall health benefits.    Office: [phone]  /  Fax: [phone]  OBESITY BEHAVIORAL INTERVENTION VISIT  Today's  visit was # 11 out of 22.  Starting weight: 326 lbs Starting date: 09/08/18 Today's weight : Weight: (!) 317 lb (143.8 kg)  Today's date: 02/03/18 Total lbs lost to date: 9 (Patients must lose 7 lbs in the first 6 months to continue with counseling)   ASK: We discussed the diagnosis of obesity with [name] today and Louay agreed to give [place] permission to discuss obesity behavioral modification therapy today.  ASSESS: Tallen has the diagnosis of obesity and his BMI today is 44.23 Kou is in the action stage of change   ADVISE: Kavan was educated on the multiple health risks of obesity as well as the benefit of weight loss to improve his health. He was advised of the need for long term treatment and the importance of lifestyle modifications.  AGREE: Multiple dietary modification options and treatment options were discussed and  Demetry agreed to follow the Category 3 plan for breakfast and lunch and journal his dinner meal 400-600 calories and 40+ g protein. We discussed the following Behavioral Modification Stratagies today: increasing lean protein intake, decreasing simple carbohydrates , increasing vegetables and work on meal planning and easy cooking plans

## 2018-02-08 ENCOUNTER — Ambulatory Visit: Payer: BC Managed Care – PPO | Admitting: Podiatry

## 2018-02-08 ENCOUNTER — Encounter: Payer: Self-pay | Admitting: Podiatry

## 2018-02-08 DIAGNOSIS — Z09 Encounter for follow-up examination after completed treatment for conditions other than malignant neoplasm: Secondary | ICD-10-CM

## 2018-02-08 NOTE — Progress Notes (Signed)
This patient returns to the office following nail surgery one week ago.  The patient says toe has been soaked and bandaged as directed.  There has been improvement of the toe since the surgery has been performed. The patient presents for continued evaluation and treatment.  GENERAL APPEARANCE: Alert, conversant. Appropriately groomed. No acute distress.  VASCULAR: Pedal pulses palpable at  DP and PT bilateral.  Capillary refill time is immediate to all digits,  Normal temperature gradient.    NEUROLOGIC: sensation is normal to 5.07 monofilament at 5/5 sites bilateral.  Light touch is intact bilateral, Muscle strength normal.  MUSCULOSKELETAL: acceptable muscle strength, tone and stability bilateral.  Intrinsic muscluature intact bilateral.  Rectus appearance of foot and digits noted bilateral.   DERMATOLOGIC: skin color, texture, and turgor are within normal limits.  No preulcerative lesions or ulcers  are seen, no interdigital maceration noted.   NAILS  There is necrotic tissue along the nail groove  In the absence of redness swelling and pain.  DX  S/p nail surgery  ROV  Home instructions were discussed.  Patient to call the office if there are any questions or concerns.   Ernst Cumpston DPM   

## 2018-02-17 ENCOUNTER — Ambulatory Visit (INDEPENDENT_AMBULATORY_CARE_PROVIDER_SITE_OTHER): Payer: BC Managed Care – PPO | Admitting: Family Medicine

## 2018-02-17 VITALS — BP 139/80 | HR 75 | Temp 98.0°F | Ht 71.0 in | Wt 316.0 lb

## 2018-02-17 DIAGNOSIS — R7303 Prediabetes: Secondary | ICD-10-CM

## 2018-02-17 DIAGNOSIS — Z9189 Other specified personal risk factors, not elsewhere classified: Secondary | ICD-10-CM | POA: Diagnosis not present

## 2018-02-17 DIAGNOSIS — Z6841 Body Mass Index (BMI) 40.0 and over, adult: Secondary | ICD-10-CM | POA: Diagnosis not present

## 2018-02-17 DIAGNOSIS — E7849 Other hyperlipidemia: Secondary | ICD-10-CM | POA: Diagnosis not present

## 2018-02-17 DIAGNOSIS — E559 Vitamin D deficiency, unspecified: Secondary | ICD-10-CM

## 2018-02-17 MED ORDER — METFORMIN HCL 500 MG PO TABS: 500.0000 mg | ORAL_TABLET | Freq: Two times a day (BID) | ORAL | 0 refills | Status: DC

## 2018-02-17 MED ORDER — VITAMIN D (ERGOCALCIFEROL) 1.25 MG (50000 UNIT) PO CAPS: 50000.0000 [IU] | ORAL_CAPSULE | ORAL | 0 refills | Status: DC

## 2018-02-17 NOTE — Progress Notes (Signed)
Office: [phone]  /  Fax: [phone]   HPI:   Chief Complaint: OBESITY Bryan [name] is here to discuss his progress with his obesity treatment plan. He is on the  keep a food journal with 400 to 600 calories and 40 grams of protein at supper daily and the Category 3 plan and is following his eating plan approximately 85 % of the time. He states he is walking 30 minutes 3 times per week. Bryan [name] continues to do well with weight loss. He is journaling for dinner and is working on increasing lean protein and vegetables. His exercise was decreased slightly with his increased work hours. His weight is (!) 316 lb (143.3 kg) today and has had a weight loss of 1 pound over a period of 4 weeks since his last visit. He has lost 10 lbs since starting treatment with [place].  Vitamin D deficiency Bryan [name] has a diagnosis of vitamin D deficiency and is due for labs. He is stable on prescription vit D and is not yet at goal. Fatigue is improving and he  denies nausea, vomiting or muscle weakness.   Ref. Range 01/05/2018 08:48  Vitamin D, 25-Hydroxy Latest Ref Range: 30.0 - 100.0 ng/mL 38.6   Pre-Diabetes Bryan [name] has a diagnosis of pre-diabetes based on his elevated Hgb A1c and was informed this puts him at greater risk of developing diabetes. He continues to work on diet and exercise to decrease risk of diabetes. He is stable on metformin and denies nausea, vomiting or hypoglycemia.  At risk for diabetes Bryan [name] is at higher than average risk for developing diabetes due to his obesity and pre-diabetes. He currently denies polyuria or polydipsia.  Hyperlipidemia Bryan [name] has hyperlipidemia and is on Lipitor. Bryan [name] is due for labs. He has been trying to improve his cholesterol levels with intensive lifestyle modification including a low saturated fat diet, exercise and weight loss. He denies any chest pain.  ALLERGIES: No Known Allergies  MEDICATIONS: Current Outpatient Medications on File Prior to Visit   Medication Sig Dispense Refill  . aspirin 81 MG tablet Take 81 mg by mouth daily.    [name] atorvastatin (LIPITOR) 20 MG tablet Take 1 tablet (20 mg total) by mouth at bedtime. 90 tablet 3  . azelastine (ASTELIN) 0.1 % nasal spray Place 2 sprays into both nostrils at bedtime as needed for rhinitis. Use in each nostril as directed    . CO ENZYME Q-10 PO Take by mouth daily.    . fish oil-omega-3 fatty acids 1000 MG capsule Take 1 g by mouth daily.    . hydrochlorothiazide (HYDRODIURIL) 25 MG tablet Take 1 tablet (25 mg total) by mouth daily. 30 tablet 0  . Krill Oil 500 MG CAPS Take 500 mg by mouth daily.    . Melatonin 1 MG CAPS Take 1 mg by mouth at bedtime as needed.     . Multiple Vitamins-Minerals (CENTRUM PO) Take 1 tablet by mouth daily.    Bryan [name] Root 450 MG CAPS Take 450 mg by mouth at bedtime as needed.      No current facility-administered medications on file prior to visit.     PAST MEDICAL HISTORY: Past Medical History:  Diagnosis Date  . Dental crowns present   . Gallbladder problem   . Hyperlipidemia   . Lower back pain   . Nephrolithiasis 2017  . Prediabetes 09/04/2014  . Seasonal allergies   . Sebaceous cyst 04/2013   upper back  . Sleep apnea    uses  CPAP nightly  . Urolithiasis 10/2016    PAST SURGICAL HISTORY: Past Surgical History:  Procedure Laterality Date  . ADENOIDECTOMY     as a child  . CHOLECYSTECTOMY  01/12/2012   Procedure: LAPAROSCOPIC CHOLECYSTECTOMY WITH INTRAOPERATIVE CHOLANGIOGRAM;  Surgeon: Bryan [name]. Tsuei, MD;  Location: [facility];  Service: General;  Laterality: N/A;  . CYST EXCISION     cyst removal from back  . EAR CYST EXCISION N/A 05/04/2013   Procedure: Excision subcutaneous mass posterior neck;  Surgeon: Bryan [name]. Bryan Dover, MD;  Location: [place];  Service: General;  Laterality: N/A;    SOCIAL HISTORY: Social History   Tobacco Use  . Smoking status: Never Smoker  . Smokeless tobacco: Never Used  Substance Use  Topics  . Alcohol use: Yes    Comment: 1 glass wine every other day  . Drug use: No    FAMILY HISTORY: Family History  Problem Relation Age of Onset  . Cancer Bryan [name]        ovarian  . Hypertension Bryan [name]   . Hyperlipidemia Bryan [name]   . Obesity Bryan [name]   . Cancer Brother        leukemia  . Diabetes Brother   . Colon cancer Maternal Aunt 84  . Cancer Paternal Grandmother        breast  . Cancer Bryan [name] 6       renal  . Kidney disease Bryan [name]   . Sleep apnea Bryan [name]   . Prostate cancer Neg Hx   . Esophageal cancer Neg Hx   . Rectal cancer Neg Hx   . Stomach cancer Neg Hx   . CAD Neg Hx     ROS: Review of Systems  Constitutional: Positive for malaise/fatigue and weight loss.  Cardiovascular: Negative for chest pain.  Gastrointestinal: Negative for nausea and vomiting.  Genitourinary: Negative for frequency.  Musculoskeletal:       Negative for muscle weakness  Endo/Heme/Allergies: Negative for polydipsia.       Negative for hypoglycemia    PHYSICAL EXAM: Blood pressure 139/80, pulse 75, temperature 98 F (36.7 C), temperature source Oral, height 5\' 11"  (1.803 m), weight (!) 316 lb (143.3 kg), SpO2 95 %. Body mass index is 44.07 kg/m. Physical Exam  RECENT LABS AND TESTS: BMET    Component Value Date/Time   NA 140 01/05/2018 0848   K 4.3 01/05/2018 0848   CL 100 01/05/2018 0848   CO2 22 01/05/2018 0848   GLUCOSE 110 (H) 01/05/2018 0848   GLUCOSE 104 (H) 06/23/2017 1028   BUN 18 01/05/2018 0848   CREATININE 0.88 01/05/2018 0848   CALCIUM 9.3 01/05/2018 0848   GFRNONAA 97 01/05/2018 0848   GFRAA 113 01/05/2018 0848   Lab Results  Component Value Date   HGBA1C 5.9 (H) 01/05/2018   HGBA1C 5.9 (H) 09/08/2017   HGBA1C 5.9 06/23/2017   HGBA1C 6.0 11/16/2016   HGBA1C 6.0 09/04/2014   Lab Results  Component Value Date   INSULIN 19.4 01/05/2018   INSULIN 21.9 09/08/2017   CBC    Component Value Date/Time   WBC 7.8 09/08/2017 1144   WBC 5.9 06/23/2017  1028   RBC 5.15 09/08/2017 1144   RBC 5.03 06/23/2017 1028   HGB 14.7 09/08/2017 1144   HCT 43.8 09/08/2017 1144   PLT 253.0 06/23/2017 1028   MCV 85 09/08/2017 1144   MCH 28.5 09/08/2017 1144   MCH 28.8 01/13/2012 0328   MCHC 33.6 09/08/2017 1144   MCHC 32.9 06/23/2017 1028  RDW 13.8 09/08/2017 1144   LYMPHSABS 2.2 09/08/2017 1144   MONOABS 0.4 06/23/2017 1028   EOSABS 0.2 09/08/2017 1144   BASOSABS 0.1 09/08/2017 1144   Iron/TIBC/Ferritin/ %Sat No results found for: IRON, TIBC, FERRITIN, IRONPCTSAT Lipid Panel     Component Value Date/Time   CHOL 144 01/05/2018 0848   TRIG 162 (H) 01/05/2018 0848   HDL 41 01/05/2018 0848   CHOLHDL 3 06/23/2017 1028   VLDL 26.2 06/23/2017 1028   LDLCALC 71 01/05/2018 0848   LDLDIRECT 136.0 11/16/2016 0902   Hepatic Function Panel     Component Value Date/Time   PROT 7.1 01/05/2018 0848   ALBUMIN 4.3 01/05/2018 0848   AST 27 01/05/2018 0848   ALT 43 01/05/2018 0848   ALKPHOS 49 01/05/2018 0848   BILITOT 0.4 01/05/2018 0848   BILIDIR 0.0 02/26/2012 1353      Component Value Date/Time   TSH 1.400 09/08/2017 1144   TSH 1.31 06/23/2017 1028   TSH 1.61 03/02/2014 0847    Ref. Range 01/05/2018 08:48  Vitamin D, 25-Hydroxy Latest Ref Range: 30.0 - 100.0 ng/mL 38.6   ASSESSMENT AND PLAN: Prediabetes - Plan: Comprehensive metabolic panel, Hemoglobin A1c, Insulin, random, metFORMIN (GLUCOPHAGE) 500 MG tablet  Vitamin D deficiency - Plan: VITAMIN D 25 Hydroxy (Vit-D Deficiency, Fractures), Vitamin D, Ergocalciferol, (DRISDOL) 50000 units CAPS capsule  Other hyperlipidemia - Plan: Lipid Panel With LDL/HDL Ratio  At risk for diabetes mellitus  Class 3 severe obesity with serious comorbidity and body mass index (BMI) of 40.0 to 44.9 in adult, unspecified obesity type (HCC)  PLAN:  Vitamin D Deficiency Deontae was informed that low vitamin D levels contributes to fatigue and are associated with obesity, breast, and colon cancer. He  agrees to continue to take prescription Vit D @50 ,000 IU every week #4 with no refills. We will check labs and he will follow up for routine testing of vitamin D, at least 2-3 times per year. He was informed of the risk of over-replacement of vitamin D and agrees to not increase his dose unless he discusses this with [place] first. Crews agrees to follow up with our clinic in 2 to 3 weeks.  Pre-Diabetes Darrian will continue to work on weight loss, exercise, and decreasing simple carbohydrates in his diet to help decrease the risk of diabetes. We dicussed metformin including benefits and risks. He was informed that eating too many simple carbohydrates or too many calories at one sitting increases the likelihood of GI side effects. Odies agrees to continue metformin 500 mg bid #60 with no refills. We will check labs and Elroy agrees to follow up with [place] as directed to monitor his progress.  Diabetes risk counseling Wlliam was given extended (15 minutes) diabetes prevention counseling today. He is 57 y.o. male and has risk factors for diabetes including obesity and pre-diabetes. We discussed intensive lifestyle modifications today with an emphasis on weight loss as well as increasing exercise and decreasing simple carbohydrates in his diet.  Hyperlipidemia Dago was informed of the American Heart Association Guidelines emphasizing intensive lifestyle modifications as the first line treatment for hyperlipidemia. We discussed many lifestyle modifications today in depth, and Brach will continue to work on decreasing saturated fats such as fatty red meat, butter and many fried foods. He will also increase vegetables and lean protein in his diet and continue to work on exercise and weight loss efforts. We will check labs. Albino agrees to continue Lipitor and follow up as directed.  Obesity Damascus  is currently in the action stage of change. As such, his goal is to continue with weight loss efforts He  has agreed to keep a food journal with 400 to 600 calories and 40+ grams of protein at supper daily and follow the Category 3 plan Geoge has been instructed to work up to a goal of 150 minutes of combined cardio and strengthening exercise per week for weight loss and overall health benefits. We discussed the following Behavioral Modification Strategies today: no skipping meals, increasing lean protein intake and work on meal planning and easy cooking plans  Reinhart has agreed to follow up with our clinic in 2 to 3 weeks. He was informed of the importance of frequent follow up visits to maximize his success with intensive lifestyle modifications for his multiple health conditions.   OBESITY BEHAVIORAL INTERVENTION VISIT  Today's visit was # 11 out of 22.  Starting weight: 326 lbs Starting date: 09/08/17 Today's weight : 316 lbs Today's date: 02/17/2018 Total lbs lost to date: 10 (Patients must lose 7 lbs in the first 6 months to continue with counseling)   ASK: We discussed the diagnosis of obesity with [name] today and Kirkland agreed to give [place] permission to discuss obesity behavioral modification therapy today.  ASSESS: Onesimo has the diagnosis of obesity and his BMI today is 44.09 Marven is in the action stage of change   ADVISE: Kareen was educated on the multiple health risks of obesity as well as the benefit of weight loss to improve his health. He was advised of the need for long term treatment and the importance of lifestyle modifications.  AGREE: Multiple dietary modification options and treatment options were discussed and  Kriss agreed to the above obesity treatment plan.  I, Doreene Nest, am acting as transcriptionist for Dennard Nip, MD  I have reviewed the above documentation for accuracy and completeness, and I agree with the above. -Dennard Nip, MD

## 2018-02-18 LAB — VITAMIN D 25 HYDROXY (VIT D DEFICIENCY, FRACTURES): VIT D 25 HYDROXY: 34.1 ng/mL (ref 30.0–100.0)

## 2018-02-18 LAB — COMPREHENSIVE METABOLIC PANEL
A/G RATIO: 1.4 (ref 1.2–2.2)
ALBUMIN: 4.3 g/dL (ref 3.5–5.5)
ALK PHOS: 50 IU/L (ref 39–117)
ALT: 29 IU/L (ref 0–44)
AST: 16 IU/L (ref 0–40)
BILIRUBIN TOTAL: 0.4 mg/dL (ref 0.0–1.2)
BUN / CREAT RATIO: 19 (ref 9–20)
BUN: 17 mg/dL (ref 6–24)
CHLORIDE: 101 mmol/L (ref 96–106)
CO2: 25 mmol/L (ref 20–29)
Calcium: 9.4 mg/dL (ref 8.7–10.2)
Creatinine, Ser: 0.9 mg/dL (ref 0.76–1.27)
GFR calc non Af Amer: 96 mL/min/{1.73_m2} (ref >59)
GFR, EST AFRICAN AMERICAN: 112 mL/min/{1.73_m2} (ref >59)
Globulin, Total: 3.1 g/dL (ref 1.5–4.5)
Glucose: 132 mg/dL — ABNORMAL HIGH (ref 65–99)
POTASSIUM: 4 mmol/L (ref 3.5–5.2)
SODIUM: 141 mmol/L (ref 134–144)
TOTAL PROTEIN: 7.4 g/dL (ref 6.0–8.5)

## 2018-02-18 LAB — LIPID PANEL WITH LDL/HDL RATIO
Cholesterol, Total: 175 mg/dL (ref 100–199)
HDL: 44 mg/dL (ref >39)
LDL CALC: 97 mg/dL (ref 0–99)
LDL/HDL RATIO: 2.2 ratio (ref 0.0–3.6)
Triglycerides: 172 mg/dL — ABNORMAL HIGH (ref 0–149)
VLDL CHOLESTEROL CAL: 34 mg/dL (ref 5–40)

## 2018-02-18 LAB — HEMOGLOBIN A1C
Est. average glucose Bld gHb Est-mCnc: 123 mg/dL
Hgb A1c MFr Bld: 5.9 % — ABNORMAL HIGH (ref 4.8–5.6)

## 2018-02-18 LAB — INSULIN, RANDOM: INSULIN: 33 u[IU]/mL — ABNORMAL HIGH (ref 2.6–24.9)

## 2018-03-05 ENCOUNTER — Other Ambulatory Visit (INDEPENDENT_AMBULATORY_CARE_PROVIDER_SITE_OTHER): Payer: Self-pay | Admitting: Family Medicine

## 2018-03-05 DIAGNOSIS — I10 Essential (primary) hypertension: Secondary | ICD-10-CM

## 2018-03-07 ENCOUNTER — Ambulatory Visit (INDEPENDENT_AMBULATORY_CARE_PROVIDER_SITE_OTHER): Payer: BC Managed Care – PPO | Admitting: Family Medicine

## 2018-03-07 ENCOUNTER — Ambulatory Visit (INDEPENDENT_AMBULATORY_CARE_PROVIDER_SITE_OTHER): Payer: BC Managed Care – PPO | Admitting: Physician Assistant

## 2018-03-08 ENCOUNTER — Ambulatory Visit (INDEPENDENT_AMBULATORY_CARE_PROVIDER_SITE_OTHER): Payer: BC Managed Care – PPO | Admitting: Physician Assistant

## 2018-03-08 ENCOUNTER — Encounter (INDEPENDENT_AMBULATORY_CARE_PROVIDER_SITE_OTHER): Payer: Self-pay | Admitting: Physician Assistant

## 2018-03-08 VITALS — BP 141/81 | HR 79 | Temp 98.3°F | Ht 71.0 in | Wt 319.0 lb

## 2018-03-08 DIAGNOSIS — Z9189 Other specified personal risk factors, not elsewhere classified: Secondary | ICD-10-CM | POA: Diagnosis not present

## 2018-03-08 DIAGNOSIS — I10 Essential (primary) hypertension: Secondary | ICD-10-CM | POA: Diagnosis not present

## 2018-03-08 DIAGNOSIS — E559 Vitamin D deficiency, unspecified: Secondary | ICD-10-CM | POA: Diagnosis not present

## 2018-03-08 DIAGNOSIS — Z6841 Body Mass Index (BMI) 40.0 and over, adult: Secondary | ICD-10-CM | POA: Diagnosis not present

## 2018-03-08 MED ORDER — LISINOPRIL 10 MG PO TABS: 10.0000 mg | ORAL_TABLET | Freq: Every day | ORAL | 0 refills | Status: DC

## 2018-03-08 MED ORDER — HYDROCHLOROTHIAZIDE 25 MG PO TABS
25.0000 mg | ORAL_TABLET | Freq: Every day | ORAL | 0 refills | Status: DC
Start: 2018-03-08 — End: 2018-04-06

## 2018-03-08 NOTE — Progress Notes (Signed)
Office: [phone]  /  Fax: [phone]   HPI:   Chief Complaint: OBESITY Bryan [name] is here to discuss his progress with his obesity treatment plan. He is on the Category 3 plan and is following his eating plan approximately 70 % of the time. He states he is walking and stairs for 15 minutes 3 times per week. Bryan [name] is mindful of his eating and controls his portions but he is not getting the recommended protein. Also, he has been eating out more.  His weight is (!) 319 lb (144.7 kg) today and has gained 3 pounds since his last visit. He has lost 7 lbs since starting treatment with [place].  Hypertension Bryan [name] is a 57 y.o. male with hypertension. Bryan [name]'s blood pressure is elevated. He is on hydrochlorothiazide 25 mg. His blood pressure has been elevated on multiple visits in the office. He denies chest pain or shortness of breath. He is working weight loss to help control his blood pressure with the goal of decreasing his risk of heart attack and stroke. Bryan [name]'s blood pressure is not currently controlled.  At risk for cardiovascular disease Bryan [name] is at a higher than average risk for cardiovascular disease due to obesity and hypertension. He currently denies any chest pain.  Vitamin D deficiency Bryan [name] has a diagnosis of vitamin D deficiency. He is currently taking prescription Vit D and denies nausea, vomiting or muscle weakness.  ALLERGIES: No Known Allergies  MEDICATIONS: Current Outpatient Medications on File Prior to Visit  Medication Sig Dispense Refill  . aspirin 81 MG tablet Take 81 mg by mouth daily.    [name] atorvastatin (LIPITOR) 20 MG tablet Take 1 tablet (20 mg total) by mouth at bedtime. 90 tablet 3  . azelastine (ASTELIN) 0.1 % nasal spray Place 2 sprays into both nostrils at bedtime as needed for rhinitis. Use in each nostril as directed    . CO ENZYME Q-10 PO Take by mouth daily.    . fish oil-omega-3 fatty acids 1000 MG capsule Take 1 g by mouth daily.    Javier Docker Oil 500 MG CAPS Take 500 mg by mouth daily.    . Melatonin 1 MG CAPS Take 1 mg by mouth at bedtime as needed.     . metFORMIN (GLUCOPHAGE) 500 MG tablet Take 1 tablet (500 mg total) by mouth 2 (two) times daily with a meal. 60 tablet 0  . Multiple Vitamins-Minerals (CENTRUM PO) Take 1 tablet by mouth daily.    Cristino Martes Root 450 MG CAPS Take 450 mg by mouth at bedtime as needed.     . Vitamin D, Ergocalciferol, (DRISDOL) 50000 units CAPS capsule Take 1 capsule (50,000 Units total) by mouth every 7 (seven) days. 4 capsule 0   No current facility-administered medications on file prior to visit.     PAST MEDICAL HISTORY: Past Medical History:  Diagnosis Date  . Dental crowns present   . Gallbladder problem   . Hyperlipidemia   . Lower back pain   . Nephrolithiasis 2017  . Prediabetes 09/04/2014  . Seasonal allergies   . Sebaceous cyst 04/2013   upper back  . Sleep apnea    uses CPAP nightly  . Urolithiasis 10/2016    PAST SURGICAL HISTORY: Past Surgical History:  Procedure Laterality Date  . ADENOIDECTOMY     as a child  . CHOLECYSTECTOMY  01/12/2012   Procedure: LAPAROSCOPIC CHOLECYSTECTOMY WITH INTRAOPERATIVE CHOLANGIOGRAM;  Surgeon: Imogene Burn. [name], MD;  Location: [facility];  Service: General;  Laterality: N/A;  . CYST EXCISION     cyst removal from back  . EAR CYST EXCISION N/A 05/04/2013   Procedure: Excision subcutaneous mass posterior neck;  Surgeon: Imogene Burn. [name], MD;  Location: [place];  Service: General;  Laterality: N/A;    SOCIAL HISTORY: Social History   Tobacco Use  . Smoking status: Never Smoker  . Smokeless tobacco: Never Used  Substance Use Topics  . Alcohol use: Yes    Comment: 1 glass wine every other day  . Drug use: No    FAMILY HISTORY: Family History  Problem Relation Age of Onset  . Cancer Mother        ovarian  . Hypertension Mother   . Hyperlipidemia Mother   . Obesity Mother   . Cancer Brother        leukemia    . Diabetes Brother   . Colon cancer Maternal Aunt 60  . Cancer Paternal Grandmother        breast  . Cancer Father 77       renal  . Kidney disease Father   . Sleep apnea Father   . Prostate cancer Neg Hx   . Esophageal cancer Neg Hx   . Rectal cancer Neg Hx   . Stomach cancer Neg Hx   . CAD Neg Hx     ROS: Review of Systems  Constitutional: Negative for weight loss.  Respiratory: Negative for shortness of breath.   Cardiovascular: Negative for chest pain.  Gastrointestinal: Negative for nausea and vomiting.  Musculoskeletal:       Negative muscle weakness    PHYSICAL EXAM: Blood pressure (!) 141/81, pulse 79, temperature 98.3 F (36.8 C), temperature source Oral, height 5\' 11"  (1.803 m), weight (!) 319 lb (144.7 kg), SpO2 97 %. Body mass index is 44.49 kg/m. Physical Exam  Constitutional: He is oriented to person, place, and time. He appears well-developed and well-nourished.  Cardiovascular: Normal rate.  Pulmonary/Chest: Effort normal.  Musculoskeletal: Normal range of motion.  Neurological: He is oriented to person, place, and time.  Skin: Skin is warm and dry.  Psychiatric: He has a normal mood and affect. His behavior is normal.  Vitals reviewed.   RECENT LABS AND TESTS: BMET    Component Value Date/Time   NA 141 02/17/2018 0844   K 4.0 02/17/2018 0844   CL 101 02/17/2018 0844   CO2 25 02/17/2018 0844   GLUCOSE 132 (H) 02/17/2018 0844   GLUCOSE 104 (H) 06/23/2017 1028   BUN 17 02/17/2018 0844   CREATININE 0.90 02/17/2018 0844   CALCIUM 9.4 02/17/2018 0844   GFRNONAA 96 02/17/2018 0844   GFRAA 112 02/17/2018 0844   Lab Results  Component Value Date   HGBA1C 5.9 (H) 02/17/2018   HGBA1C 5.9 (H) 01/05/2018   HGBA1C 5.9 (H) 09/08/2017   HGBA1C 5.9 06/23/2017   HGBA1C 6.0 11/16/2016   Lab Results  Component Value Date   INSULIN 33.0 (H) 02/17/2018   INSULIN 19.4 01/05/2018   INSULIN 21.9 09/08/2017   CBC    Component Value Date/Time   WBC  7.8 09/08/2017 1144   WBC 5.9 06/23/2017 1028   RBC 5.15 09/08/2017 1144   RBC 5.03 06/23/2017 1028   HGB 14.7 09/08/2017 1144   HCT 43.8 09/08/2017 1144   PLT 253.0 06/23/2017 1028   MCV 85 09/08/2017 1144   MCH 28.5 09/08/2017 1144   MCH 28.8 01/13/2012 0328   MCHC 33.6 09/08/2017 1144   MCHC 32.9  06/23/2017 1028   RDW 13.8 09/08/2017 1144   LYMPHSABS 2.2 09/08/2017 1144   MONOABS 0.4 06/23/2017 1028   EOSABS 0.2 09/08/2017 1144   BASOSABS 0.1 09/08/2017 1144   Iron/TIBC/Ferritin/ %Sat No results found for: IRON, TIBC, FERRITIN, IRONPCTSAT Lipid Panel     Component Value Date/Time   CHOL 175 02/17/2018 0844   TRIG 172 (H) 02/17/2018 0844   HDL 44 02/17/2018 0844   CHOLHDL 3 06/23/2017 1028   VLDL 26.2 06/23/2017 1028   LDLCALC 97 02/17/2018 0844   LDLDIRECT 136.0 11/16/2016 0902   Hepatic Function Panel     Component Value Date/Time   PROT 7.4 02/17/2018 0844   ALBUMIN 4.3 02/17/2018 0844   AST 16 02/17/2018 0844   ALT 29 02/17/2018 0844   ALKPHOS 50 02/17/2018 0844   BILITOT 0.4 02/17/2018 0844   BILIDIR 0.0 02/26/2012 1353      Component Value Date/Time   TSH 1.400 09/08/2017 1144   TSH 1.31 06/23/2017 1028   TSH 1.61 03/02/2014 0847  Results for Lincoln, JAYDEEN ODOR (MRN [id number]) as of 03/08/2018 13:23  Ref. Range 02/17/2018 08:44  Vitamin D, 25-Hydroxy Latest Ref Range: 30.0 - 100.0 ng/mL 34.1    ASSESSMENT AND PLAN: Essential hypertension - Plan: lisinopril (PRINIVIL,ZESTRIL) 10 MG tablet, hydrochlorothiazide (HYDRODIURIL) 25 MG tablet  Vitamin D deficiency  At risk for heart disease  Class 3 severe obesity with serious comorbidity and body mass index (BMI) of 40.0 to 44.9 in adult, unspecified obesity type ([place])  PLAN:  Hypertension We discussed sodium restriction, working on healthy weight loss, and a regular exercise program as the means to achieve improved blood pressure control. Aadarsh agreed with this plan and agreed to follow up as directed.  We will continue to monitor his blood pressure as well as his progress with the above lifestyle modifications. Jaymari agrees to continue taking lisinopril 10 mg qd #30 and we will refill for 1 month and he agrees to continue taking hydrochlorothiazide 25 mg qd #30 and we will refill for 1 month. He will watch for signs of hypotension as he continues his lifestyle modifications. Ade agrees to follow up with our clinic in 2 weeks.  Cardiovascular risk counselling Amen was given extended (15 minutes) coronary artery disease prevention counseling today. He is 57 y.o. male and has risk factors for heart disease including obesity and hypertension. We discussed intensive lifestyle modifications today with an emphasis on specific weight loss instructions and strategies. Pt was also informed of the importance of increasing exercise and decreasing saturated fats to help prevent heart disease.  Vitamin D Deficiency Jamarrion was informed that low vitamin D levels contributes to fatigue and are associated with obesity, breast, and colon cancer. Mivaan agrees to continue taking prescription Vit D @50 ,000 IU every week and will follow up for routine testing of vitamin D, at least 2-3 times per year. He was informed of the risk of over-replacement of vitamin D and agrees to not increase his dose unless he discusses this with [place] first. Gregorio agrees to follow up with our clinic in 2 weeks.  Obesity Barnes is currently in the action stage of change. As such, his goal is to continue with weight loss efforts He has agreed to follow the Category 3 plan Jernard has been instructed to work up to a goal of 150 minutes of combined cardio and strengthening exercise per week for weight loss and overall health benefits. We discussed the following Behavioral Modification Strategies today: increasing lean protein  intake and decrease eating out   Alejandra has agreed to follow up with our clinic in 2 weeks. He was informed  of the importance of frequent follow up visits to maximize his success with intensive lifestyle modifications for his multiple health conditions.   OBESITY BEHAVIORAL INTERVENTION VISIT  Today's visit was # 12 out of 22.  Starting weight: 326 lbs Starting date: 09/08/17 Today's weight : 319 lbs Today's date: 03/08/2018 Total lbs lost to date: 7 (Patients must lose 7 lbs in the first 6 months to continue with counseling)   ASK: We discussed the diagnosis of obesity with [name] today and Deverick agreed to give [place] permission to discuss obesity behavioral modification therapy today.  ASSESS: Dirck has the diagnosis of obesity and his BMI today is 44.51 Arihant is in the action stage of change   ADVISE: Greyden was educated on the multiple health risks of obesity as well as the benefit of weight loss to improve his health. He was advised of the need for long term treatment and the importance of lifestyle modifications.  AGREE: Multiple dietary modification options and treatment options were discussed and  Salvadore agreed to the above obesity treatment plan.   Wilhemena Durie, am acting as transcriptionist for Lacy Duverney, PA-C I, Lacy Duverney [facility], have reviewed this note and agree with its content

## 2018-03-23 ENCOUNTER — Ambulatory Visit (INDEPENDENT_AMBULATORY_CARE_PROVIDER_SITE_OTHER): Payer: BC Managed Care – PPO | Admitting: Physician Assistant

## 2018-03-23 VITALS — BP 144/79 | HR 80 | Temp 98.3°F | Ht 71.0 in | Wt 316.0 lb

## 2018-03-23 DIAGNOSIS — Z6841 Body Mass Index (BMI) 40.0 and over, adult: Secondary | ICD-10-CM | POA: Diagnosis not present

## 2018-03-23 DIAGNOSIS — I10 Essential (primary) hypertension: Secondary | ICD-10-CM

## 2018-03-23 NOTE — Progress Notes (Signed)
Office: [phone]  /  Fax: [phone]   HPI:   Chief Complaint: OBESITY Bryan [name] is here to discuss his progress with his obesity treatment plan. He is on the Category 3 plan and is following his eating plan approximately 80 % of the time. He states he is exercising on the elliptical for 30 minutes 4 times per week. Bryan [name] continues to do well with weight loss. He is following the meal plan closely and states his hunger is well controlled.  His weight is (!) 316 lb (143.3 kg) today and has had a weight loss of 3 pounds over a period of 2 weeks since his last visit. He has lost 10 lbs since starting treatment with [place].  Hypertension Bryan [name] is a 57 y.o. male with hypertension. His blood pressure is elevated today. He was started on Lisinopril 10 mg at his last visit. Bryan [name] is also on HCTZ and states he may have missed some doses of Lisinopril. He complains of  intermittent cough, typical of his allergies. We will monitor if potential ACE inhibitor is related to his cough. Bryan [name] denies chest pain or shortness of breath on exertion. He is working weight loss to help control his blood pressure with the goal of decreasing his risk of heart attack and stroke. Bryan [name] blood pressure is not currently controlled.  ALLERGIES: No Known Allergies  MEDICATIONS: Current Outpatient Medications on File Prior to Visit  Medication Sig Dispense Refill  . aspirin 81 MG tablet Take 81 mg by mouth daily.    [name] atorvastatin (LIPITOR) 20 MG tablet Take 1 tablet (20 mg total) by mouth at bedtime. 90 tablet 3  . azelastine (ASTELIN) 0.1 % nasal spray Place 2 sprays into both nostrils at bedtime as needed for rhinitis. Use in each nostril as directed    . CO ENZYME Q-10 PO Take by mouth daily.    . fish oil-omega-3 fatty acids 1000 MG capsule Take 1 g by mouth daily.    . hydrochlorothiazide (HYDRODIURIL) 25 MG tablet Take 1 tablet (25 mg total) by mouth daily. 30 tablet 0  . lisinopril  (PRINIVIL,ZESTRIL) 10 MG tablet Take 1 tablet (10 mg total) by mouth daily. 30 tablet 0  . Melatonin 1 MG CAPS Take 1 mg by mouth at bedtime as needed.     . metFORMIN (GLUCOPHAGE) 500 MG tablet Take 1 tablet (500 mg total) by mouth 2 (two) times daily with a meal. 60 tablet 0  . Multiple Vitamins-Minerals (CENTRUM PO) Take 1 tablet by mouth daily.    Bryan [name] 450 MG CAPS Take 450 mg by mouth at bedtime as needed.     . Vitamin D, Ergocalciferol, (DRISDOL) 50000 units CAPS capsule Take 1 capsule (50,000 Units total) by mouth every 7 (seven) days. 4 capsule 0   No current facility-administered medications on file prior to visit.     PAST MEDICAL HISTORY: Past Medical History:  Diagnosis Date  . Dental crowns present   . Gallbladder problem   . Hyperlipidemia   . Lower back pain   . Nephrolithiasis 2017  . Prediabetes 09/04/2014  . Seasonal allergies   . Sebaceous cyst 04/2013   upper back  . Sleep apnea    uses CPAP nightly  . Urolithiasis 10/2016    PAST SURGICAL HISTORY: Past Surgical History:  Procedure Laterality Date  . ADENOIDECTOMY     as a child  . CHOLECYSTECTOMY  01/12/2012   Procedure: LAPAROSCOPIC CHOLECYSTECTOMY WITH INTRAOPERATIVE CHOLANGIOGRAM;  Surgeon: [name], MD;  Location: [facility];  Service: General;  Laterality: N/A;  . CYST EXCISION     cyst removal from back  . EAR CYST EXCISION N/A 05/04/2013   Procedure: Excision subcutaneous mass posterior neck;  Surgeon: Imogene Burn. [name], MD;  Location: [facility];  Service: General;  Laterality: N/A;    SOCIAL HISTORY: Social History   Tobacco Use  . Smoking status: Never Smoker  . Smokeless tobacco: Never Used  Substance Use Topics  . Alcohol use: Yes    Comment: 1 glass wine every other day  . Drug use: No    FAMILY HISTORY: Family History  Problem Relation Age of Onset  . Cancer Mother        ovarian  . Hypertension Mother   . Hyperlipidemia Mother   . Obesity Mother     . Cancer Brother        leukemia  . Diabetes Brother   . Colon cancer Maternal Aunt 59  . Cancer Paternal Grandmother        breast  . Cancer Father 86       renal  . Kidney disease Father   . Sleep apnea Father   . Prostate cancer Neg Hx   . Esophageal cancer Neg Hx   . Rectal cancer Neg Hx   . Stomach cancer Neg Hx   . CAD Neg Hx     ROS: Review of Systems  Constitutional: Positive for weight loss.  Respiratory: Negative for shortness of breath (on exertion).   Cardiovascular: Negative for chest pain.    PHYSICAL EXAM: Blood pressure (!) 144/79, pulse 80, temperature 98.3 F (36.8 C), height 5\' 11"  (1.803 m), weight (!) 316 lb (143.3 kg), SpO2 96 %. Body mass index is 44.07 kg/m. Physical Exam  Constitutional: He is oriented to person, place, and time. He appears well-developed and well-nourished.  Cardiovascular: Normal rate.  Pulmonary/Chest: Effort normal.  Musculoskeletal: Normal range of motion.  Neurological: He is oriented to person, place, and time.  Skin: Skin is warm and dry.  Psychiatric: He has a normal mood and affect. His behavior is normal.  Vitals reviewed.   RECENT LABS AND TESTS: BMET    Component Value Date/Time   NA 141 02/17/2018 0844   K 4.0 02/17/2018 0844   CL 101 02/17/2018 0844   CO2 25 02/17/2018 0844   GLUCOSE 132 (H) 02/17/2018 0844   GLUCOSE 104 (H) 06/23/2017 1028   BUN 17 02/17/2018 0844   CREATININE 0.90 02/17/2018 0844   CALCIUM 9.4 02/17/2018 0844   GFRNONAA 96 02/17/2018 0844   GFRAA 112 02/17/2018 0844   Lab Results  Component Value Date   HGBA1C 5.9 (H) 02/17/2018   HGBA1C 5.9 (H) 01/05/2018   HGBA1C 5.9 (H) 09/08/2017   HGBA1C 5.9 06/23/2017   HGBA1C 6.0 11/16/2016   Lab Results  Component Value Date   INSULIN 33.0 (H) 02/17/2018   INSULIN 19.4 01/05/2018   INSULIN 21.9 09/08/2017   CBC    Component Value Date/Time   WBC 7.8 09/08/2017 1144   WBC 5.9 06/23/2017 1028   RBC 5.15 09/08/2017 1144   RBC  5.03 06/23/2017 1028   HGB 14.7 09/08/2017 1144   HCT 43.8 09/08/2017 1144   PLT 253.0 06/23/2017 1028   MCV 85 09/08/2017 1144   MCH 28.5 09/08/2017 1144   MCH 28.8 01/13/2012 0328   MCHC 33.6 09/08/2017 1144   MCHC 32.9 06/23/2017 1028   RDW 13.8  09/08/2017 1144   LYMPHSABS 2.2 09/08/2017 1144   MONOABS 0.4 06/23/2017 1028   EOSABS 0.2 09/08/2017 1144   BASOSABS 0.1 09/08/2017 1144   Iron/TIBC/Ferritin/ %Sat No results found for: IRON, TIBC, FERRITIN, IRONPCTSAT Lipid Panel     Component Value Date/Time   CHOL 175 02/17/2018 0844   TRIG 172 (H) 02/17/2018 0844   HDL 44 02/17/2018 0844   CHOLHDL 3 06/23/2017 1028   VLDL 26.2 06/23/2017 1028   LDLCALC 97 02/17/2018 0844   LDLDIRECT 136.0 11/16/2016 0902   Hepatic Function Panel     Component Value Date/Time   PROT 7.4 02/17/2018 0844   ALBUMIN 4.3 02/17/2018 0844   AST 16 02/17/2018 0844   ALT 29 02/17/2018 0844   ALKPHOS 50 02/17/2018 0844   BILITOT 0.4 02/17/2018 0844   BILIDIR 0.0 02/26/2012 1353      Component Value Date/Time   TSH 1.400 09/08/2017 1144   TSH 1.31 06/23/2017 1028   TSH 1.61 03/02/2014 0847   Results for Bryan [name], Bryan [name] (MRN [id number]) as of 03/23/2018 15:14  Ref. Range 02/17/2018 08:44  Vitamin D, 25-Hydroxy Latest Ref Range: 30.0 - 100.0 ng/mL 34.1   ASSESSMENT AND PLAN: Essential hypertension  Class 3 severe obesity with serious comorbidity and body mass index (BMI) of 40.0 to 44.9 in adult, unspecified obesity type ([place])  PLAN:  Hypertension We discussed sodium restriction, working on healthy weight loss, and a regular exercise program as the means to achieve improved blood pressure control. Thatcher agreed with this plan and agreed to follow up as directed. We will continue to monitor his blood pressure as well as his progress with the above lifestyle modifications. He will continue his medications as prescribed and will watch for signs of hypotension as he continues his lifestyle  modifications.  We spent > than 50% of the 15 minute visit on the counseling as documented in the note.  Obesity Bryan [name] is currently in the action stage of change. As such, his goal is to continue with weight loss efforts He has agreed to follow the Category 3 plan Bryan [name] has been instructed to work up to a goal of 150 minutes of combined cardio and strengthening exercise per week for weight loss and overall health benefits. We discussed the following Behavioral Modification Strategies today: increasing lean protein intake and work on meal planning and easy cooking plans  [name] has agreed to follow up with our clinic in 2 weeks. He was informed of the importance of frequent follow up visits to maximize his success with intensive lifestyle modifications for his multiple health conditions.   OBESITY BEHAVIORAL INTERVENTION VISIT  Today's visit was # 13 out of 22.  Starting weight: 326 lbs Starting date: 09/08/17 Today's weight : 316 lbs  Today's date: 03/23/2018 Total lbs lost to date: 10 (Patients must lose 7 lbs in the first 6 months to continue with counseling)   ASK: We discussed the diagnosis of obesity with Bryan [name] today and Bryan [name] agreed to give [place] permission to discuss obesity behavioral modification therapy today.  ASSESS: Bryan [name] has the diagnosis of obesity and his BMI today is 44.09 Bryan [name] is in the action stage of change   ADVISE: Bryan [name] was educated on the multiple health risks of obesity as well as the benefit of weight loss to improve his health. He was advised of the need for long term treatment and the importance of lifestyle modifications.  AGREE: Multiple dietary modification options and treatment options were discussed and  Bryan [name] agreed to the above obesity treatment plan.   Bryan [name], am acting as transcriptionist for [employer], PA-C I, Lacy Duverney [facility], have reviewed this note and agree with its content

## 2018-03-27 ENCOUNTER — Other Ambulatory Visit (INDEPENDENT_AMBULATORY_CARE_PROVIDER_SITE_OTHER): Payer: Self-pay | Admitting: Family Medicine

## 2018-03-27 DIAGNOSIS — E559 Vitamin D deficiency, unspecified: Secondary | ICD-10-CM

## 2018-03-27 DIAGNOSIS — R7303 Prediabetes: Secondary | ICD-10-CM

## 2018-03-28 ENCOUNTER — Encounter: Payer: Self-pay | Admitting: Medical

## 2018-03-29 ENCOUNTER — Encounter: Payer: Self-pay | Admitting: Internal Medicine

## 2018-03-29 ENCOUNTER — Ambulatory Visit: Payer: BC Managed Care – PPO | Admitting: Internal Medicine

## 2018-03-29 VITALS — BP 134/82 | HR 85 | Temp 98.2°F | Resp 16 | Ht 71.0 in | Wt 324.0 lb

## 2018-03-29 DIAGNOSIS — H6690 Otitis media, unspecified, unspecified ear: Secondary | ICD-10-CM

## 2018-03-29 DIAGNOSIS — J40 Bronchitis, not specified as acute or chronic: Secondary | ICD-10-CM

## 2018-03-29 MED ORDER — HYDROCODONE-HOMATROPINE 5-1.5 MG/5ML PO SYRP: 5.0000 mL | ORAL_SOLUTION | Freq: Every evening | ORAL | 0 refills | Status: DC | PRN

## 2018-03-29 MED ORDER — AZITHROMYCIN 250 MG PO TABS: ORAL_TABLET | ORAL | 0 refills | Status: DC

## 2018-03-29 NOTE — Progress Notes (Signed)
Pre visit review using our clinic review tool, if applicable. No additional management support is needed unless otherwise documented below in the visit note. 

## 2018-03-29 NOTE — Progress Notes (Signed)
Subjective:    Patient ID: Bryan [name], male    DOB: [birth year], 57 y.o.   MRN: [id number]  DOS:  03/29/2018 Type of visit - description : Acute visit Interval history: Sx started ~ 5 days ago, malaise, "flulike" symptoms. Dry cough with minimal -if any- amounts of brown sputum. Cough is persisting, unable to sleep well at night. He is taking Mucinex, Advil, Tylenol.  Trying to keep himself hydrated.   Wt Readings from Last 3 Encounters:  03/29/18 (!) 324 lb (147 kg)  03/23/18 (!) 316 lb (143.3 kg)  03/08/18 (!) 319 lb (144.7 kg)     Review of Systems Had a fever for the first time last night, low-grade No nausea, vomiting, diarrhea. Mild headache yesterday mostly due to cough. No rash + Left ear pressure.  Past Medical History:  Diagnosis Date  . Dental crowns present   . Gallbladder problem   . Hyperlipidemia   . Lower back pain   . Nephrolithiasis 2017  . Prediabetes 09/04/2014  . Seasonal allergies   . Sebaceous cyst 04/2013   upper back  . Sleep apnea    uses CPAP nightly  . Urolithiasis 10/2016    Past Surgical History:  Procedure Laterality Date  . ADENOIDECTOMY     as a child  . CHOLECYSTECTOMY  01/12/2012   Procedure: LAPAROSCOPIC CHOLECYSTECTOMY WITH INTRAOPERATIVE CHOLANGIOGRAM;  Surgeon: [name], MD;  Location: [facility];  Service: General;  Laterality: N/A;  . CYST EXCISION     cyst removal from back  . EAR CYST EXCISION N/A 05/04/2013   Procedure: Excision subcutaneous mass posterior neck;  Surgeon: Imogene Burn. [name], MD;  Location: [facility];  Service: General;  Laterality: N/A;    Social History   Socioeconomic History  . Marital status: Married    Spouse name: Patty  . Number of children: 3  . Years of education: Not on file  . Highest education level: Not on file  Occupational History  . Occupation:  Retail banker, A/C units     Employer: [employer]  Social Needs  . Financial resource strain: Not on  file  . Food insecurity:    Worry: Not on file    Inability: Not on file  . Transportation needs:    Medical: Not on file    Non-medical: Not on file  Tobacco Use  . Smoking status: Never Smoker  . Smokeless tobacco: Never Used  Substance and Sexual Activity  . Alcohol use: Yes    Comment: 1 glass wine every other day  . Drug use: No  . Sexual activity: Not on file  Lifestyle  . Physical activity:    Days per week: Not on file    Minutes per session: Not on file  . Stress: Not on file  Relationships  . Social connections:    Talks on phone: Not on file    Gets together: Not on file    Attends religious service: Not on file    Active member of club or organization: Not on file    Attends meetings of clubs or organizations: Not on file    Relationship status: Not on file  . Intimate partner violence:    Fear of current or ex partner: Not on file    Emotionally abused: Not on file    Physically abused: Not on file    Forced sexual activity: Not on file  Other Topics Concern  . 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 Always use your most recent med list.          aspirin 81 MG tablet Take 81 mg by mouth daily.   atorvastatin 20 MG tablet Commonly known as:  LIPITOR Take 1 tablet (20 mg total) by mouth at bedtime.   azelastine 0.1 % nasal spray Commonly known as:  ASTELIN Place 2 sprays into both nostrils at bedtime as needed for rhinitis. Use in each nostril as directed   azithromycin 250 MG tablet Commonly known as:  ZITHROMAX Z-PAK 2 tabs a day the first day, then 1 tab a day x 4 days   CENTRUM PO Take 1 tablet by mouth daily.   CO ENZYME Q-10 PO Take by mouth daily.   fish oil-omega-3 fatty  acids 1000 MG capsule Take 1 g by mouth daily.   hydrochlorothiazide 25 MG tablet Commonly known as:  HYDRODIURIL Take 1 tablet (25 mg total) by mouth daily.   HYDROcodone-homatropine 5-1.5 MG/5ML syrup Commonly known as:  HYCODAN Take 5 mLs by mouth at bedtime as needed for cough.   lisinopril 10 MG tablet Commonly known as:  PRINIVIL,ZESTRIL Take 1 tablet (10 mg total) by mouth daily.   Melatonin 1 MG Caps Take 1 mg by mouth at bedtime as needed.   metFORMIN 500 MG tablet Commonly known as:  GLUCOPHAGE Take 1 tablet (500 mg total) by mouth 2 (two) times daily with a meal.   Valerian Root 450 MG Caps Take 450 mg by mouth at bedtime as needed.   Vitamin D (Ergocalciferol) 50000 units Caps capsule Commonly known as:  DRISDOL Take 1 capsule (50,000 Units total) by mouth every 7 (seven) days.          Objective:   Physical Exam BP 134/82 (BP Location: Left Arm, Patient Position: Sitting, Cuff Size: Normal)   Pulse 85   Temp 98.2 F (36.8 C) (Oral)   Resp 16   Ht 5\' 11"  (1.803 m)   Wt (!) 324 lb (147 kg)   SpO2 96%   BMI 45.19 kg/m   General:   Well developed, overweight appearing. NAD.  HEENT:  Normocephalic . Face symmetric, atraumatic. Left TM: Bulge, slightly red.  Right TM normal.  Throat symmetric.  Nose quite congested, sinuses non-TTP. Lungs:  CTA B Normal respiratory effort, no intercostal retractions, no accessory muscle use. Heart: RRR,  no murmur.  No pretibial edema bilaterally  Skin: Not pale. Not jaundice Neurologic:  alert & oriented X3.  Speech normal, gait appropriate for age and unassisted Psych--  Cognition and judgment appear intact.  Cooperative with normal attention span and concentration.  Behavior appropriate. No anxious or depressed appearing.      Assessment & Plan:    Assessment Prediabetes Hyperlipidemia Insomnia -- on OTCs Morbid obesity, BMI 46 OSA, CPAP Seasonal allergies Kidney stone (first)  10-2016  PLAN: Bronchitis, otitis media: Sx c/w above diagnosis.  Rec rest, fluids, Astelin, Flonase, Mucinex and also hydrocodone for cough control.  Drowsiness discussed.  To start a Z-Pak.  Call if no better Morbid obesity: Working with the weight management  clinic and so far doing better.  Praised!

## 2018-03-29 NOTE — Patient Instructions (Signed)
Rest, fluids , tylenol  For cough:  Take Mucinex DM twice a day as needed until better For cough at night: Hydrocodone, will cause drowsiness  For nasal congestion: Use OTC Nasocort or Flonase : 2 nasal sprays on each side of the nose in the morning until you feel better Use ASTELIN a prescribed spray : 2 nasal sprays on each side of the nose at night until you feel better   Avoid decongestants such as  Pseudoephedrine or phenylephrine     Take the antibiotic as prescribed, Zithromax  Call if not gradually better over the next  10 days  Call anytime if the symptoms are severe

## 2018-03-30 NOTE — Assessment & Plan Note (Signed)
Bronchitis, otitis media: Sx c/w above diagnosis.  Rec rest, fluids, Astelin, Flonase, Mucinex and also hydrocodone for cough control.  Drowsiness discussed.  To start a Z-Pak.  Call if no better Morbid obesity: Working with the weight management clinic and so far doing better.  Praised!

## 2018-04-06 ENCOUNTER — Ambulatory Visit (INDEPENDENT_AMBULATORY_CARE_PROVIDER_SITE_OTHER): Payer: BC Managed Care – PPO | Admitting: Physician Assistant

## 2018-04-06 VITALS — BP 104/65 | HR 72 | Temp 97.9°F | Ht 71.0 in | Wt 321.0 lb

## 2018-04-06 DIAGNOSIS — I10 Essential (primary) hypertension: Secondary | ICD-10-CM | POA: Diagnosis not present

## 2018-04-06 DIAGNOSIS — E559 Vitamin D deficiency, unspecified: Secondary | ICD-10-CM | POA: Diagnosis not present

## 2018-04-06 DIAGNOSIS — Z6841 Body Mass Index (BMI) 40.0 and over, adult: Secondary | ICD-10-CM | POA: Diagnosis not present

## 2018-04-06 DIAGNOSIS — Z9189 Other specified personal risk factors, not elsewhere classified: Secondary | ICD-10-CM | POA: Diagnosis not present

## 2018-04-06 DIAGNOSIS — R7303 Prediabetes: Secondary | ICD-10-CM

## 2018-04-06 MED ORDER — VITAMIN D (ERGOCALCIFEROL) 1.25 MG (50000 UNIT) PO CAPS: 50000.0000 [IU] | ORAL_CAPSULE | ORAL | 0 refills | Status: DC

## 2018-04-06 MED ORDER — METFORMIN HCL 500 MG PO TABS: 500.0000 mg | ORAL_TABLET | Freq: Two times a day (BID) | ORAL | 0 refills | Status: DC

## 2018-04-06 MED ORDER — HYDROCHLOROTHIAZIDE 25 MG PO TABS
25.0000 mg | ORAL_TABLET | Freq: Every day | ORAL | 0 refills | Status: DC
Start: 2018-04-06 — End: 2018-05-09

## 2018-04-06 MED ORDER — LISINOPRIL 10 MG PO TABS: 10.0000 mg | ORAL_TABLET | Freq: Every day | ORAL | 0 refills | Status: DC

## 2018-04-07 NOTE — Progress Notes (Signed)
Office: [phone]  /  Fax: [phone]   HPI:   Chief Complaint: OBESITY Bryan [name] is here to discuss his progress with his obesity treatment plan. He is on the Category 3 plan and is following his eating plan approximately 60 % of the time. He states he is exercising 0 minutes 0 times per week. Bryan [name] has been sick with an upper respiratory infection and hasn't been on track with his eating. He is motivated to get back on track and continue with weight loss.  His weight is (!) 321 lb (145.6 kg) today and has gained 5 pounds since his last visit. He has lost 5 lbs since starting treatment with [place].  Hypertension Bryan [name] is a 57 y.o. male with hypertension. Bryan [name]'s blood pressure is stable. He denies chest pain or shortness of breath. He is working weight loss to help control his blood pressure with the goal of decreasing his risk of heart attack and stroke. Bryan [name]'s blood pressure is currently controlled.  At risk for cardiovascular disease Bryan [name] is at a higher than average risk for cardiovascular disease due to obesity and hypertension. He currently denies any chest pain.  Vitamin D Deficiency Bryan [name] has a diagnosis of vitamin D deficiency. He is currently taking prescription Vit D and denies nausea, vomiting or muscle weakness.  Pre-Diabetes Bryan [name] has a diagnosis of pre-diabetes based on his elevated Hgb A1c and was informed this puts him at greater risk of developing diabetes. He is taking metformin currently and continues to work on diet and exercise to decrease risk of diabetes. He denies polyphagia, nausea or hypoglycemia.  ALLERGIES: No Known Allergies  MEDICATIONS: Current Outpatient Medications on File Prior to Visit  Medication Sig Dispense Refill  . aspirin 81 MG tablet Take 81 mg by mouth daily.    [name] atorvastatin (LIPITOR) 20 MG tablet Take 1 tablet (20 mg total) by mouth at bedtime. 90 tablet 3  . azelastine (ASTELIN) 0.1 % nasal spray Place 2 sprays  into both nostrils at bedtime as needed for rhinitis. Use in each nostril as directed    . azithromycin (ZITHROMAX Z-PAK) 250 MG tablet 2 tabs a day the first day, then 1 tab a day x 4 days 6 tablet 0  . CO ENZYME Q-10 PO Take by mouth daily.    . fish oil-omega-3 fatty acids 1000 MG capsule Take 1 g by mouth daily.    [name] HYDROcodone-homatropine (HYCODAN) 5-1.5 MG/5ML syrup Take 5 mLs by mouth at bedtime as needed for cough. 120 mL 0  . Melatonin 1 MG CAPS Take 1 mg by mouth at bedtime as needed.     . Multiple Vitamins-Minerals (CENTRUM PO) Take 1 tablet by mouth daily.    Bryan [name] Martes Root 450 MG CAPS Take 450 mg by mouth at bedtime as needed.      No current facility-administered medications on file prior to visit.     PAST MEDICAL HISTORY: Past Medical History:  Diagnosis Date  . Dental crowns present   . Gallbladder problem   . Hyperlipidemia   . Lower back pain   . Nephrolithiasis 2017  . Prediabetes 09/04/2014  . Seasonal allergies   . Sebaceous cyst 04/2013   upper back  . Sleep apnea    uses CPAP nightly  . Urolithiasis 10/2016    PAST SURGICAL HISTORY: Past Surgical History:  Procedure Laterality Date  . ADENOIDECTOMY     as a child  . CHOLECYSTECTOMY  01/12/2012   Procedure: LAPAROSCOPIC CHOLECYSTECTOMY WITH  INTRAOPERATIVE CHOLANGIOGRAM;  Surgeon: [name], MD;  Location: [facility];  Service: General;  Laterality: N/A;  . CYST EXCISION     cyst removal from back  . EAR CYST EXCISION N/A 05/04/2013   Procedure: Excision subcutaneous mass posterior neck;  Surgeon: Imogene Burn. [name], MD;  Location: [facility];  Service: General;  Laterality: N/A;    SOCIAL HISTORY: Social History   Tobacco Use  . Smoking status: Never Smoker  . Smokeless tobacco: Never Used  Substance Use Topics  . Alcohol use: Yes    Comment: 1 glass wine every other day  . Drug use: No    FAMILY HISTORY: Family History  Problem Relation Age of Onset  . Cancer Mother         ovarian  . Hypertension Mother   . Hyperlipidemia Mother   . Obesity Mother   . Cancer Brother        leukemia  . Diabetes Brother   . Colon cancer Maternal Aunt 3  . Cancer Paternal Grandmother        breast  . Cancer Father 38       renal  . Kidney disease Father   . Sleep apnea Father   . Prostate cancer Neg Hx   . Esophageal cancer Neg Hx   . Rectal cancer Neg Hx   . Stomach cancer Neg Hx   . CAD Neg Hx     ROS: Review of Systems  Constitutional: Negative for weight loss.  Respiratory: Negative for shortness of breath.   Cardiovascular: Negative for chest pain.  Gastrointestinal: Negative for nausea and vomiting.  Musculoskeletal:       Negative muscle weakness  Endo/Heme/Allergies:       Negative polyphagia Negative hypoglycemia    PHYSICAL EXAM: Blood pressure 104/65, pulse 72, temperature 97.9 F (36.6 C), temperature source Oral, height 5\' 11"  (1.803 m), weight (!) 321 lb (145.6 kg), SpO2 95 %. Body mass index is 44.77 kg/m. Physical Exam  Constitutional: He is oriented to person, place, and time. He appears well-developed and well-nourished.  Cardiovascular: Normal rate.  Pulmonary/Chest: Effort normal.  Musculoskeletal: Normal range of motion.  Neurological: He is oriented to person, place, and time.  Skin: Skin is warm and dry.  Psychiatric: He has a normal mood and affect. His behavior is normal.  Vitals reviewed.   RECENT LABS AND TESTS: BMET    Component Value Date/Time   NA 141 02/17/2018 0844   K 4.0 02/17/2018 0844   CL 101 02/17/2018 0844   CO2 25 02/17/2018 0844   GLUCOSE 132 (H) 02/17/2018 0844   GLUCOSE 104 (H) 06/23/2017 1028   BUN 17 02/17/2018 0844   CREATININE 0.90 02/17/2018 0844   CALCIUM 9.4 02/17/2018 0844   GFRNONAA 96 02/17/2018 0844   GFRAA 112 02/17/2018 0844   Lab Results  Component Value Date   HGBA1C 5.9 (H) 02/17/2018   HGBA1C 5.9 (H) 01/05/2018   HGBA1C 5.9 (H) 09/08/2017   HGBA1C 5.9 06/23/2017   HGBA1C  6.0 11/16/2016   Lab Results  Component Value Date   INSULIN 33.0 (H) 02/17/2018   INSULIN 19.4 01/05/2018   INSULIN 21.9 09/08/2017   CBC    Component Value Date/Time   WBC 7.8 09/08/2017 1144   WBC 5.9 06/23/2017 1028   RBC 5.15 09/08/2017 1144   RBC 5.03 06/23/2017 1028   HGB 14.7 09/08/2017 1144   HCT 43.8 09/08/2017 1144   PLT 253.0 06/23/2017 1028  MCV 85 09/08/2017 1144   MCH 28.5 09/08/2017 1144   MCH 28.8 01/13/2012 0328   MCHC 33.6 09/08/2017 1144   MCHC 32.9 06/23/2017 1028   RDW 13.8 09/08/2017 1144   LYMPHSABS 2.2 09/08/2017 1144   MONOABS 0.4 06/23/2017 1028   EOSABS 0.2 09/08/2017 1144   BASOSABS 0.1 09/08/2017 1144   Iron/TIBC/Ferritin/ %Sat No results found for: IRON, TIBC, FERRITIN, IRONPCTSAT Lipid Panel     Component Value Date/Time   CHOL 175 02/17/2018 0844   TRIG 172 (H) 02/17/2018 0844   HDL 44 02/17/2018 0844   CHOLHDL 3 06/23/2017 1028   VLDL 26.2 06/23/2017 1028   LDLCALC 97 02/17/2018 0844   LDLDIRECT 136.0 11/16/2016 0902   Hepatic Function Panel     Component Value Date/Time   PROT 7.4 02/17/2018 0844   ALBUMIN 4.3 02/17/2018 0844   AST 16 02/17/2018 0844   ALT 29 02/17/2018 0844   ALKPHOS 50 02/17/2018 0844   BILITOT 0.4 02/17/2018 0844   BILIDIR 0.0 02/26/2012 1353      Component Value Date/Time   TSH 1.400 09/08/2017 1144   TSH 1.31 06/23/2017 1028   TSH 1.61 03/02/2014 0847  Results for Bryan [name], Bryan [name] "[place]" (MRN [id number]) as of 04/07/2018 12:46  Ref. Range 02/17/2018 08:44  Vitamin D, 25-Hydroxy Latest Ref Range: 30.0 - 100.0 ng/mL 34.1    ASSESSMENT AND PLAN: Essential hypertension - Plan: lisinopril (PRINIVIL,ZESTRIL) 10 MG tablet, hydrochlorothiazide (HYDRODIURIL) 25 MG tablet  Vitamin D deficiency - Plan: Vitamin D, Ergocalciferol, (DRISDOL) 50000 units CAPS capsule  Prediabetes - Plan: metFORMIN (GLUCOPHAGE) 500 MG tablet  At risk for heart disease  Class 3 severe obesity with serious comorbidity and body  mass index (BMI) of 40.0 to 44.9 in adult, unspecified obesity type ([place])  PLAN:  Hypertension We discussed sodium restriction, working on healthy weight loss, and a regular exercise program as the means to achieve improved blood pressure control. [place] agreed with this plan and agreed to follow up as directed. We will continue to monitor his blood pressure as well as his progress with the above lifestyle modifications. Deklen agrees to continue taking lisinopril 10 mg qd #30 and we will refill for 1 month and he agrees to continue taking hydrochlorothiazide 25 mg qd #30 and we will refill for 1 month. He will watch for signs of hypotension as he continues his lifestyle modifications. Zelma agrees to follow up with our clinic in 2 weeks.  Cardiovascular risk counselling Hiroshi was given extended (15 minutes) coronary artery disease prevention counseling today. He is 57 y.o. male and has risk factors for heart disease including obesity and hypertension. We discussed intensive lifestyle modifications today with an emphasis on specific weight loss instructions and strategies. Pt was also informed of the importance of increasing exercise and decreasing saturated fats to help prevent heart disease.  Vitamin D Deficiency Bryan [name] was informed that low vitamin D levels contributes to fatigue and are associated with obesity, breast, and colon cancer. Bryan [name] agrees to continue taking prescription Vit D @50 ,000 IU every week #4 and we will refill for 1 month. He will follow up for routine testing of vitamin D, at least 2-3 times per year. He was informed of the risk of over-replacement of vitamin D and agrees to not increase his dose unless he discusses this with [place] first. Bryan [name] agrees to follow up with our clinic in 2 weeks.  Pre-Diabetes Bryan [name] will continue to work on weight loss, exercise, and decreasing simple carbohydrates in  his diet to help decrease the risk of diabetes. We dicussed metformin  including benefits and risks. He was informed that eating too many simple carbohydrates or too many calories at one sitting increases the likelihood of GI side effects. Bryan [name] agrees to continue taking metformin 500 mg BID #60 and we will refill for 1 month. Bryan [name] agrees to follow up with our clinic in 2 weeks as directed to monitor his progress.  Obesity Bryan [name] is currently in the action stage of change. As such, his goal is to continue with weight loss efforts He has agreed to follow the Category 3 plan Bryan [name] has been instructed to work up to a goal of 150 minutes of combined cardio and strengthening exercise per week for weight loss and overall health benefits. We discussed the following Behavioral Modification Strategies today: increasing lean protein intake and work on meal planning and easy cooking plans   Bryan [name] has agreed to follow up with our clinic in 2 weeks. He was informed of the importance of frequent follow up visits to maximize his success with intensive lifestyle modifications for his multiple health conditions.   OBESITY BEHAVIORAL INTERVENTION VISIT  Today's visit was # 14 out of 22.  Starting weight: 326 lbs Starting date: 09/08/17 Today's weight : 321 lbs Today's date: 04/06/2018 Total lbs lost to date: 5 (Patients must lose 7 lbs in the first 6 months to continue with counseling)   ASK: We discussed the diagnosis of obesity with [name] today and Syncere agreed to give [place] permission to discuss obesity behavioral modification therapy today.  ASSESS: Kayler has the diagnosis of obesity and his BMI today is 44.79 Bryan [name] is in the action stage of change   ADVISE: Bryan [name] was educated on the multiple health risks of obesity as well as the benefit of weight loss to improve his health. He was advised of the need for long term treatment and the importance of lifestyle modifications.  AGREE: Multiple dietary modification options and treatment options  were discussed and  Bryan [name] agreed to the above obesity treatment plan.   Wilhemena Durie, am acting as transcriptionist for Lacy Duverney, PA-C I, Lacy Duverney [facility], have reviewed this note and agree with its content

## 2018-04-14 ENCOUNTER — Encounter: Payer: Self-pay | Admitting: Internal Medicine

## 2018-04-15 ENCOUNTER — Ambulatory Visit: Payer: BC Managed Care – PPO | Admitting: Internal Medicine

## 2018-04-15 ENCOUNTER — Encounter: Payer: Self-pay | Admitting: Internal Medicine

## 2018-04-15 VITALS — BP 132/74 | HR 71 | Temp 97.6°F | Resp 16 | Ht 71.0 in | Wt 324.4 lb

## 2018-04-15 DIAGNOSIS — H6590 Unspecified nonsuppurative otitis media, unspecified ear: Secondary | ICD-10-CM

## 2018-04-15 DIAGNOSIS — J40 Bronchitis, not specified as acute or chronic: Secondary | ICD-10-CM

## 2018-04-15 MED ORDER — DOXYCYCLINE HYCLATE 100 MG PO TABS: 100.0000 mg | ORAL_TABLET | Freq: Two times a day (BID) | ORAL | 0 refills | Status: DC

## 2018-04-15 MED ORDER — PREDNISONE 10 MG PO TABS: ORAL_TABLET | ORAL | 0 refills | Status: DC

## 2018-04-15 MED ORDER — HYDROCODONE-HOMATROPINE 5-1.5 MG/5ML PO SYRP
5.0000 mL | ORAL_SOLUTION | Freq: Every evening | ORAL | 0 refills | Status: DC | PRN
Start: 2018-04-15 — End: 2018-09-29

## 2018-04-15 NOTE — Progress Notes (Signed)
Subjective:    Patient ID: Bryan [name], male    DOB: 1961-02-25, 57 y.o.   MRN: [id number]  DOS:  04/15/2018 Type of visit - description : acute Interval history: Was seen few weeks ago with bronchitis, otitis media.  Was prescribed Z-Pak. He felt a slightly better temporarily but he continue with symptoms: Cough, feeling tired, chest congestion.  Wt Readings from Last 3 Encounters:  04/15/18 (!) 324 lb 6 oz (147.1 kg)  04/06/18 (!) 321 lb (145.6 kg)  03/29/18 (!) 324 lb (147 kg)     Review of Systems Denies fever chills No wheezing, no history of asthma but at some point he was prescribed an inhaler. mild sinus congestion Cough is productive of very small amount of mucus.  Past Medical History:  Diagnosis Date  . Dental crowns present   . Gallbladder problem   . Hyperlipidemia   . Lower back pain   . Nephrolithiasis 2017  . Prediabetes 09/04/2014  . Seasonal allergies   . Sebaceous cyst 04/2013   upper back  . Sleep apnea    uses CPAP nightly  . Urolithiasis 10/2016    Past Surgical History:  Procedure Laterality Date  . ADENOIDECTOMY     as a child  . CHOLECYSTECTOMY  01/12/2012   Procedure: LAPAROSCOPIC CHOLECYSTECTOMY WITH INTRAOPERATIVE CHOLANGIOGRAM;  Surgeon: [name], MD;  Location: [facility];  Service: General;  Laterality: N/A;  . CYST EXCISION     cyst removal from back  . EAR CYST EXCISION N/A 05/04/2013   Procedure: Excision subcutaneous mass posterior neck;  Surgeon: Imogene Burn. [name], MD;  Location: [facility];  Service: General;  Laterality: N/A;    Social History   Socioeconomic History  . Marital status: Married    Spouse name: Patty  . Number of children: 3  . Years of education: Not on file  . Highest education level: Not on file  Occupational History  . Occupation:  Retail banker, A/C units     Employer: [employer]  Social Needs  . Financial resource strain: Not on file  . Food insecurity:    Worry:  Not on file    Inability: Not on file  . Transportation needs:    Medical: Not on file    Non-medical: Not on file  Tobacco Use  . Smoking status: Never Smoker  . Smokeless tobacco: Never Used  Substance and Sexual Activity  . Alcohol use: Yes    Comment: 1 glass wine every other day  . Drug use: No  . Sexual activity: Not on file  Lifestyle  . Physical activity:    Days per week: Not on file    Minutes per session: Not on file  . Stress: Not on file  Relationships  . Social connections:    Talks on phone: Not on file    Gets together: Not on file    Attends religious service: Not on file    Active member of club or organization: Not on file    Attends meetings of clubs or organizations: Not on file    Relationship status: Not on file  . Intimate partner violence:    Fear of current or ex partner: Not on file    Emotionally abused: Not on file    Physically abused: Not on file    Forced sexual activity: Not on file  Other Topics Concern  . 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 Always use your most recent med list.          aspirin 81 MG tablet Take 81 mg by mouth daily.   atorvastatin 20 MG tablet Commonly known as:  LIPITOR Take 1 tablet (20 mg total) by mouth at bedtime.   azelastine 0.1 % nasal spray Commonly known as:  ASTELIN Place 2 sprays into both nostrils at bedtime as needed for rhinitis. Use in each nostril as directed   CENTRUM PO Take 1 tablet by mouth daily.   CO ENZYME Q-10 PO Take by mouth daily.   doxycycline 100 MG tablet Commonly known as:  VIBRA-TABS Take 1 tablet (100 mg total) by mouth 2 (two) times daily.   fish oil-omega-3 fatty acids 1000 MG capsule Take 1 g by  mouth daily.   hydrochlorothiazide 25 MG tablet Commonly known as:  HYDRODIURIL Take 1 tablet (25 mg total) by mouth daily.   HYDROcodone-homatropine 5-1.5 MG/5ML syrup Commonly known as:  HYCODAN Take 5 mLs by mouth at bedtime as needed for cough.   lisinopril 10 MG tablet Commonly known as:  PRINIVIL,ZESTRIL Take 1 tablet (10 mg total) by mouth daily.   Melatonin 1 MG Caps Take 1 mg by mouth at bedtime as needed.   metFORMIN 500 MG tablet Commonly known as:  GLUCOPHAGE Take 1 tablet (500 mg total) by mouth 2 (two) times daily with a meal.   predniSONE 10 MG tablet Commonly known as:  DELTASONE 3 tabs x 2 days, 2 tabs x 2 days, 1 tab x 2 days   Valerian Root 450 MG Caps Take 450 mg by mouth at bedtime as needed.   Vitamin D (Ergocalciferol) 50000 units Caps capsule Commonly known as:  DRISDOL Take 1 capsule (50,000 Units total) by mouth every 7 (seven) days.          Objective:   Physical Exam BP 132/74 (BP Location: Left Arm, Patient Position: Sitting, Cuff Size: Normal)   Pulse 71   Temp 97.6 F (36.4 C) (Oral)   Resp 16   Ht 5\' 11"  (1.803 m)   Wt (!) 324 lb 6 oz (147.1 kg)   SpO2 97%   BMI 45.24 kg/m  General:   Well developed, well nourished . NAD.  Mild cough noted during the visit HEENT:  Normocephalic . Face symmetric, atraumatic Left TM: + Fluid, no red Right TM: Flat, no red. Lungs:  CTA B Normal respiratory effort, no intercostal retractions, no accessory muscle use. Heart: RRR,  no murmur.  No pretibial edema bilaterally  Skin: Not pale. Not jaundice Neurologic:  alert & oriented X3.  Speech normal, gait appropriate for age and unassisted Psych--  Cognition and judgment appear intact.  Cooperative with normal attention span and concentration.  Behavior appropriate. No anxious or depressed appearing.      Assessment & Plan:   Assessment Prediabetes Hyperlipidemia Insomnia -- on OTCs Morbid obesity, BMI 46 OSA, CPAP Seasonal  allergies Kidney stone (first) 10-2016  PLAN: Bronchitis, persistent cough. Left serous otitis Ongoing symptoms for more than 2 weeks. Plan: Low-dose prednisone to decrease mucus burden and help serous otitis.  Second round  of antibiotics with doxycycline, continue Mucinex, nasal sprays.  If not gradually improving will consider change lisinopril as it might make the recovery from bronchitis a little more difficult.

## 2018-04-15 NOTE — Patient Instructions (Signed)
For cough:  Take Mucinex DM twice a day as needed until better At night , use hydrocodone as needed   Use OTC Nasocort or Flonase : 2 nasal sprays on each side of the nose in the morning until you feel better Use ASTELIN a prescribed spray : 2 nasal sprays on each side of the nose at night until you feel better  Prednisone for few days   Take the antibiotic as prescribed  (doxy )  Call if not gradually better over the next  10 days  Call anytime if the symptoms are severe

## 2018-04-15 NOTE — Progress Notes (Signed)
Pre visit review using our clinic review tool, if applicable. No additional management support is needed unless otherwise documented below in the visit note. 

## 2018-04-16 NOTE — Assessment & Plan Note (Signed)
Bronchitis, persistent cough. Left serous otitis Ongoing symptoms for more than 2 weeks. Plan: Low-dose prednisone to decrease mucus burden and help serous otitis.  Second round of antibiotics with doxycycline, continue Mucinex, nasal sprays.  If not gradually improving will consider change lisinopril as it might make the recovery from bronchitis a little more difficult.

## 2018-04-20 ENCOUNTER — Encounter (INDEPENDENT_AMBULATORY_CARE_PROVIDER_SITE_OTHER): Payer: Self-pay | Admitting: Physician Assistant

## 2018-04-20 ENCOUNTER — Ambulatory Visit (INDEPENDENT_AMBULATORY_CARE_PROVIDER_SITE_OTHER): Payer: BC Managed Care – PPO | Admitting: Physician Assistant

## 2018-04-20 VITALS — BP 135/78 | HR 70 | Temp 98.0°F | Ht 71.0 in | Wt 322.0 lb

## 2018-04-20 DIAGNOSIS — I10 Essential (primary) hypertension: Secondary | ICD-10-CM

## 2018-04-20 DIAGNOSIS — Z6841 Body Mass Index (BMI) 40.0 and over, adult: Secondary | ICD-10-CM

## 2018-04-20 NOTE — Progress Notes (Signed)
Office: [phone]  /  Fax: [phone]   HPI:   Chief Complaint: OBESITY Bryan [name] is here to discuss his progress with his obesity treatment plan. He is on the Category 3 plan and is following his eating plan approximately 60 % of the time. He states he is doing the elliptical for 30 minutes 3 times per week. Bryan [name] is still sick with bronchitis and states he has not been keeping up with his eating and is not following the meal plan.  His weight is (!) 322 lb (146.1 kg) today and has gained 1 pound since his last visit. He has lost 4 lbs since starting treatment with Bryan [name].  Hypertension Bryan [name] is a 57 y.o. male with hypertension. Bryan [name] blood pressure is stable. He states he stopped taking lisinopril as he is unsure if it caused the cough that he has had. He denies chest pain or shortness of breath. He is working weight loss to help control his blood pressure with the goal of decreasing his risk of heart attack and stroke. Bryan [name] blood pressure is currently controlled.  ALLERGIES: No Known Allergies  MEDICATIONS: Current Outpatient Medications on File Prior to Visit  Medication Sig Dispense Refill  . aspirin 81 MG tablet Take 81 mg by mouth daily.    [name] atorvastatin (LIPITOR) 20 MG tablet Take 1 tablet (20 mg total) by mouth at bedtime. 90 tablet 3  . azelastine (ASTELIN) 0.1 % nasal spray Place 2 sprays into both nostrils at bedtime as needed for rhinitis. Use in each nostril as directed    . CO ENZYME Q-10 PO Take by mouth daily.    [name] doxycycline (VIBRA-TABS) 100 MG tablet Take 1 tablet (100 mg total) by mouth 2 (two) times daily. 15 tablet 0  . fish oil-omega-3 fatty acids 1000 MG capsule Take 1 g by mouth daily.    . hydrochlorothiazide (HYDRODIURIL) 25 MG tablet Take 1 tablet (25 mg total) by mouth daily. 30 tablet 0  . HYDROcodone-homatropine (HYCODAN) 5-1.5 MG/5ML syrup Take 5 mLs by mouth at bedtime as needed for cough. 120 mL 0  . lisinopril (PRINIVIL,ZESTRIL)  10 MG tablet Take 1 tablet (10 mg total) by mouth daily. 30 tablet 0  . Melatonin 1 MG CAPS Take 1 mg by mouth at bedtime as needed.     . metFORMIN (GLUCOPHAGE) 500 MG tablet Take 1 tablet (500 mg total) by mouth 2 (two) times daily with a meal. 60 tablet 0  . Multiple Vitamins-Minerals (CENTRUM PO) Take 1 tablet by mouth daily.    . predniSONE (DELTASONE) 10 MG tablet 3 tabs x 2 days, 2 tabs x 2 days, 1 tab x 2 days 12 tablet 0  . Valerian Root 450 MG CAPS Take 450 mg by mouth at bedtime as needed.     . Vitamin D, Ergocalciferol, (DRISDOL) 50000 units CAPS capsule Take 1 capsule (50,000 Units total) by mouth every 7 (seven) days. 4 capsule 0   No current facility-administered medications on file prior to visit.     PAST MEDICAL HISTORY: Past Medical History:  Diagnosis Date  . Dental crowns present   . Gallbladder problem   . Hyperlipidemia   . Lower back pain   . Nephrolithiasis 2017  . Prediabetes 09/04/2014  . Seasonal allergies   . Sebaceous cyst 04/2013   upper back  . Sleep apnea    uses CPAP nightly  . Urolithiasis 10/2016    PAST SURGICAL HISTORY: Past Surgical History:  Procedure Laterality Date  .  ADENOIDECTOMY     as a child  . CHOLECYSTECTOMY  01/12/2012   Procedure: LAPAROSCOPIC CHOLECYSTECTOMY WITH INTRAOPERATIVE CHOLANGIOGRAM;  Surgeon: [name], MD;  Location: [facility];  Service: General;  Laterality: N/A;  . CYST EXCISION     cyst removal from back  . EAR CYST EXCISION N/A 05/04/2013   Procedure: Excision subcutaneous mass posterior neck;  Surgeon: Imogene Burn. [name], MD;  Location: [facility];  Service: General;  Laterality: N/A;    SOCIAL HISTORY: Social History   Tobacco Use  . Smoking status: Never Smoker  . Smokeless tobacco: Never Used  Substance Use Topics  . Alcohol use: Yes    Comment: 1 glass wine every other day  . Drug use: No    FAMILY HISTORY: Family History  Problem Relation Age of Onset  . Cancer Mother         ovarian  . Hypertension Mother   . Hyperlipidemia Mother   . Obesity Mother   . Cancer Brother        leukemia  . Diabetes Brother   . Colon cancer Maternal Aunt 48  . Cancer Paternal Grandmother        breast  . Cancer Father 61       renal  . Kidney disease Father   . Sleep apnea Father   . Prostate cancer Neg Hx   . Esophageal cancer Neg Hx   . Rectal cancer Neg Hx   . Stomach cancer Neg Hx   . CAD Neg Hx     ROS: Review of Systems  Constitutional: Negative for weight loss.  Respiratory: Negative for shortness of breath.   Cardiovascular: Negative for chest pain.    PHYSICAL EXAM: Blood pressure 135/78, pulse 70, temperature 98 F (36.7 C), temperature source Oral, height 5\' 11"  (1.803 m), weight (!) 322 lb (146.1 kg), SpO2 96 %. Body mass index is 44.91 kg/m. Physical Exam  Constitutional: He is oriented to person, place, and time. He appears well-developed and well-nourished.  Cardiovascular: Normal rate.  Pulmonary/Chest: Effort normal.  Musculoskeletal: Normal range of motion.  Neurological: He is oriented to person, place, and time.  Skin: Skin is warm and dry.  Psychiatric: He has a normal mood and affect. His behavior is normal.  Vitals reviewed.   RECENT LABS AND TESTS: BMET    Component Value Date/Time   NA 141 02/17/2018 0844   K 4.0 02/17/2018 0844   CL 101 02/17/2018 0844   CO2 25 02/17/2018 0844   GLUCOSE 132 (H) 02/17/2018 0844   GLUCOSE 104 (H) 06/23/2017 1028   BUN 17 02/17/2018 0844   CREATININE 0.90 02/17/2018 0844   CALCIUM 9.4 02/17/2018 0844   GFRNONAA 96 02/17/2018 0844   GFRAA 112 02/17/2018 0844   Lab Results  Component Value Date   HGBA1C 5.9 (H) 02/17/2018   HGBA1C 5.9 (H) 01/05/2018   HGBA1C 5.9 (H) 09/08/2017   HGBA1C 5.9 06/23/2017   HGBA1C 6.0 11/16/2016   Lab Results  Component Value Date   INSULIN 33.0 (H) 02/17/2018   INSULIN 19.4 01/05/2018   INSULIN 21.9 09/08/2017   CBC    Component Value Date/Time    WBC 7.8 09/08/2017 1144   WBC 5.9 06/23/2017 1028   RBC 5.15 09/08/2017 1144   RBC 5.03 06/23/2017 1028   HGB 14.7 09/08/2017 1144   HCT 43.8 09/08/2017 1144   PLT 253.0 06/23/2017 1028   MCV 85 09/08/2017 1144   MCH 28.5 09/08/2017 1144  MCH 28.8 01/13/2012 0328   MCHC 33.6 09/08/2017 1144   MCHC 32.9 06/23/2017 1028   RDW 13.8 09/08/2017 1144   LYMPHSABS 2.2 09/08/2017 1144   MONOABS 0.4 06/23/2017 1028   EOSABS 0.2 09/08/2017 1144   BASOSABS 0.1 09/08/2017 1144   Iron/TIBC/Ferritin/ %Sat No results found for: IRON, TIBC, FERRITIN, IRONPCTSAT Lipid Panel     Component Value Date/Time   CHOL 175 02/17/2018 0844   TRIG 172 (H) 02/17/2018 0844   HDL 44 02/17/2018 0844   CHOLHDL 3 06/23/2017 1028   VLDL 26.2 06/23/2017 1028   LDLCALC 97 02/17/2018 0844   LDLDIRECT 136.0 11/16/2016 0902   Hepatic Function Panel     Component Value Date/Time   PROT 7.4 02/17/2018 0844   ALBUMIN 4.3 02/17/2018 0844   AST 16 02/17/2018 0844   ALT 29 02/17/2018 0844   ALKPHOS 50 02/17/2018 0844   BILITOT 0.4 02/17/2018 0844   BILIDIR 0.0 02/26/2012 1353      Component Value Date/Time   TSH 1.400 09/08/2017 1144   TSH 1.31 06/23/2017 1028   TSH 1.61 03/02/2014 0847    ASSESSMENT AND PLAN: Essential hypertension  Class 3 severe obesity with serious comorbidity and body mass index (BMI) of 45.0 to 49.9 in adult, unspecified obesity type (HCC)  PLAN:  Hypertension We discussed sodium restriction, working on healthy weight loss, and a regular exercise program as the means to achieve improved blood pressure control. Bryan [name] agreed with this plan and agreed to follow up as directed. We will continue to monitor his blood pressure as well as his progress with the above lifestyle modifications. He will continue his medications as prescribed and will watch for signs of hypotension as he continues his lifestyle modifications. Bryan [name] agrees to follow up with our clinic in 3 weeks.  We spent >  than 50% of the 15 minute visit on the counseling as documented in the note.  Obesity Bryan [name] is currently in the action stage of change. As such, his goal is to continue with weight loss efforts He has agreed to follow the Category 3 plan Bryan [name] has been instructed to work up to a goal of 150 minutes of combined cardio and strengthening exercise per week for weight loss and overall health benefits. We discussed the following Behavioral Modification Strategies today: increasing lean protein intake and work on meal planning and easy cooking plans   Bryan [name] has agreed to follow up with our clinic in 3 weeks. He was informed of the importance of frequent follow up visits to maximize his success with intensive lifestyle modifications for his multiple health conditions.   OBESITY BEHAVIORAL INTERVENTION VISIT  Today's visit was # 15 out of 22.  Starting weight: 326 lbs Starting date: 09/08/17 Today's weight : 322 lbs  Today's date: 04/20/2018 Total lbs lost to date: 4 (Patients must lose 7 lbs in the first 6 months to continue with counseling)   ASK: We discussed the diagnosis of obesity with [name] today and Rush agreed to give Bryan [name] permission to discuss obesity behavioral modification therapy today.  ASSESS: Bryan [name] has the diagnosis of obesity and his BMI today is 44.93 Bryan [name] is in the action stage of change   ADVISE: Bryan [name] was educated on the multiple health risks of obesity as well as the benefit of weight loss to improve his health. He was advised of the need for long term treatment and the importance of lifestyle modifications.  AGREE: Multiple dietary modification options and treatment options were  discussed and  Bryan [name] agreed to the above obesity treatment plan.   Wilhemena Durie, am acting as transcriptionist for Lacy Duverney, PA-C I, Lacy Duverney [facility], have reviewed this note and agree with its content

## 2018-05-09 ENCOUNTER — Encounter (INDEPENDENT_AMBULATORY_CARE_PROVIDER_SITE_OTHER): Payer: Self-pay | Admitting: Physician Assistant

## 2018-05-09 ENCOUNTER — Ambulatory Visit (INDEPENDENT_AMBULATORY_CARE_PROVIDER_SITE_OTHER): Payer: BC Managed Care – PPO | Admitting: Physician Assistant

## 2018-05-09 VITALS — BP 123/76 | HR 74 | Temp 98.3°F | Ht 71.0 in | Wt 321.0 lb

## 2018-05-09 DIAGNOSIS — I10 Essential (primary) hypertension: Secondary | ICD-10-CM | POA: Diagnosis not present

## 2018-05-09 DIAGNOSIS — Z9189 Other specified personal risk factors, not elsewhere classified: Secondary | ICD-10-CM | POA: Diagnosis not present

## 2018-05-09 DIAGNOSIS — E559 Vitamin D deficiency, unspecified: Secondary | ICD-10-CM

## 2018-05-09 DIAGNOSIS — R7303 Prediabetes: Secondary | ICD-10-CM | POA: Diagnosis not present

## 2018-05-09 DIAGNOSIS — Z6841 Body Mass Index (BMI) 40.0 and over, adult: Secondary | ICD-10-CM | POA: Diagnosis not present

## 2018-05-09 MED ORDER — METFORMIN HCL 500 MG PO TABS: 500.0000 mg | ORAL_TABLET | Freq: Two times a day (BID) | ORAL | 0 refills | Status: DC

## 2018-05-09 MED ORDER — HYDROCHLOROTHIAZIDE 25 MG PO TABS: 25.0000 mg | ORAL_TABLET | Freq: Every day | ORAL | 0 refills | Status: DC

## 2018-05-09 MED ORDER — VITAMIN D (ERGOCALCIFEROL) 1.25 MG (50000 UNIT) PO CAPS: 50000.0000 [IU] | ORAL_CAPSULE | ORAL | 0 refills | Status: DC

## 2018-05-09 NOTE — Progress Notes (Signed)
Office: [phone]  /  Fax: [phone]   HPI:   Chief Complaint: OBESITY Bryan [name] is here to discuss his progress with his obesity treatment plan. He is on the Category 3 plan and is following his eating plan approximately 50 % of the time. He states he is walking for 30 minutes 3 times per week. Bryan [name] continues to do well with weight loss. He was on vacation but made better food choices and controlled his portions.  His weight is (!) 321 lb (145.6 kg) today and has had a weight loss of 1 pound over a period of 2 to 3 weeks since his last visit. He has lost 5 lbs since starting treatment with [place].  Hypertension Bryan [name] is a 57 y.o. male with hypertension. Bryan [name]'s blood pressure is stable. He has been on lisinopril but developed a non-resolving cough and has been off lisinopril. He denies chest pain or shortness of breath. He is working weight loss to help control his blood pressure with the goal of decreasing his risk of heart attack and stroke. Bryan [name]'s blood pressure is currently controlled.  At risk for cardiovascular disease Bryan [name] is at a higher than average risk for cardiovascular disease due to obesity and hypertension. He currently denies any chest pain.  Vitamin D Deficiency Bryan [name] has a diagnosis of vitamin D deficiency. He is currently taking prescription Vit D and denies nausea, vomiting or muscle weakness.  Pre-Diabetes Bryan [name] has a diagnosis of pre-diabetes based on his elevated Hgb A1c and was informed this puts him at greater risk of developing diabetes. He is taking metformin currently and continues to work on diet and exercise to decrease risk of diabetes. He denies polyphagia, nausea, or hypoglycemia.  ALLERGIES: Allergies  Allergen Reactions  . Lisinopril Cough    MEDICATIONS: Current Outpatient Medications on File Prior to Visit  Medication Sig Dispense Refill  . aspirin 81 MG tablet Take 81 mg by mouth daily.    [name] atorvastatin (LIPITOR) 20  MG tablet Take 1 tablet (20 mg total) by mouth at bedtime. 90 tablet 3  . azelastine (ASTELIN) 0.1 % nasal spray Place 2 sprays into both nostrils at bedtime as needed for rhinitis. Use in each nostril as directed    . CO ENZYME Q-10 PO Take by mouth daily.    [name] doxycycline (VIBRA-TABS) 100 MG tablet Take 1 tablet (100 mg total) by mouth 2 (two) times daily. 15 tablet 0  . fish oil-omega-3 fatty acids 1000 MG capsule Take 1 g by mouth daily.    . hydrochlorothiazide (HYDRODIURIL) 25 MG tablet Take 1 tablet (25 mg total) by mouth daily. 30 tablet 0  . HYDROcodone-homatropine (HYCODAN) 5-1.5 MG/5ML syrup Take 5 mLs by mouth at bedtime as needed for cough. 120 mL 0  . Melatonin 1 MG CAPS Take 1 mg by mouth at bedtime as needed.     . metFORMIN (GLUCOPHAGE) 500 MG tablet Take 1 tablet (500 mg total) by mouth 2 (two) times daily with a meal. 60 tablet 0  . Multiple Vitamins-Minerals (CENTRUM PO) Take 1 tablet by mouth daily.    Bryan [name] 450 MG CAPS Take 450 mg by mouth at bedtime as needed.     . Vitamin D, Ergocalciferol, (DRISDOL) 50000 units CAPS capsule Take 1 capsule (50,000 Units total) by mouth every 7 (seven) days. 4 capsule 0   No current facility-administered medications on file prior to visit.     PAST MEDICAL HISTORY: Past Medical History:  Diagnosis Date  .  Dental crowns present   . Gallbladder problem   . Hyperlipidemia   . Lower back pain   . Nephrolithiasis 2017  . Prediabetes 09/04/2014  . Seasonal allergies   . Sebaceous cyst 04/2013   upper back  . Sleep apnea    uses CPAP nightly  . Urolithiasis 10/2016    PAST SURGICAL HISTORY: Past Surgical History:  Procedure Laterality Date  . ADENOIDECTOMY     as a child  . CHOLECYSTECTOMY  01/12/2012   Procedure: LAPAROSCOPIC CHOLECYSTECTOMY WITH INTRAOPERATIVE CHOLANGIOGRAM;  Surgeon: [name], MD;  Location: [facility];  Service: General;  Laterality: N/A;  . CYST EXCISION     cyst removal from back  . EAR CYST  EXCISION N/A 05/04/2013   Procedure: Excision subcutaneous mass posterior neck;  Surgeon: Imogene Burn. [name], MD;  Location: [place];  Service: General;  Laterality: N/A;    SOCIAL HISTORY: Social History   Tobacco Use  . Smoking status: Never Smoker  . Smokeless tobacco: Never Used  Substance Use Topics  . Alcohol use: Yes    Comment: 1 glass wine every other day  . Drug use: No    FAMILY HISTORY: Family History  Problem Relation Age of Onset  . Cancer Mother        ovarian  . Hypertension Mother   . Hyperlipidemia Mother   . Obesity Mother   . Cancer Brother        leukemia  . Diabetes Brother   . Colon cancer Maternal Aunt 62  . Cancer Paternal Grandmother        breast  . Cancer Father 44       renal  . Kidney disease Father   . Sleep apnea Father   . Prostate cancer Neg Hx   . Esophageal cancer Neg Hx   . Rectal cancer Neg Hx   . Stomach cancer Neg Hx   . CAD Neg Hx     ROS: Review of Systems  Constitutional: Positive for weight loss.  Respiratory: Negative for shortness of breath.   Cardiovascular: Negative for chest pain.  Gastrointestinal: Negative for nausea and vomiting.  Musculoskeletal:       Negative muscle weakness  Endo/Heme/Allergies:       Negative polyphagia Negative hypoglycemia    PHYSICAL EXAM: Blood pressure 123/76, pulse 74, temperature 98.3 F (36.8 C), temperature source Oral, height 5\' 11"  (1.803 m), weight (!) 321 lb (145.6 kg), SpO2 98 %. Body mass index is 44.77 kg/m. Physical Exam  Constitutional: He is oriented to person, place, and time. He appears well-developed and well-nourished.  Cardiovascular: Normal rate.  Pulmonary/Chest: Effort normal.  Musculoskeletal: Normal range of motion.  Neurological: He is oriented to person, place, and time.  Skin: Skin is warm and dry.  Psychiatric: He has a normal mood and affect. His behavior is normal.  Vitals reviewed.   RECENT LABS AND TESTS: BMET      Component Value Date/Time   NA 141 02/17/2018 0844   K 4.0 02/17/2018 0844   CL 101 02/17/2018 0844   CO2 25 02/17/2018 0844   GLUCOSE 132 (H) 02/17/2018 0844   GLUCOSE 104 (H) 06/23/2017 1028   BUN 17 02/17/2018 0844   CREATININE 0.90 02/17/2018 0844   CALCIUM 9.4 02/17/2018 0844   GFRNONAA 96 02/17/2018 0844   GFRAA 112 02/17/2018 0844   Lab Results  Component Value Date   HGBA1C 5.9 (H) 02/17/2018   HGBA1C 5.9 (H) 01/05/2018   HGBA1C  5.9 (H) 09/08/2017   HGBA1C 5.9 06/23/2017   HGBA1C 6.0 11/16/2016   Lab Results  Component Value Date   INSULIN 33.0 (H) 02/17/2018   INSULIN 19.4 01/05/2018   INSULIN 21.9 09/08/2017   CBC    Component Value Date/Time   WBC 7.8 09/08/2017 1144   WBC 5.9 06/23/2017 1028   RBC 5.15 09/08/2017 1144   RBC 5.03 06/23/2017 1028   HGB 14.7 09/08/2017 1144   HCT 43.8 09/08/2017 1144   PLT 253.0 06/23/2017 1028   MCV 85 09/08/2017 1144   MCH 28.5 09/08/2017 1144   MCH 28.8 01/13/2012 0328   MCHC 33.6 09/08/2017 1144   MCHC 32.9 06/23/2017 1028   RDW 13.8 09/08/2017 1144   LYMPHSABS 2.2 09/08/2017 1144   MONOABS 0.4 06/23/2017 1028   EOSABS 0.2 09/08/2017 1144   BASOSABS 0.1 09/08/2017 1144   Iron/TIBC/Ferritin/ %Sat No results found for: IRON, TIBC, FERRITIN, IRONPCTSAT Lipid Panel     Component Value Date/Time   CHOL 175 02/17/2018 0844   TRIG 172 (H) 02/17/2018 0844   HDL 44 02/17/2018 0844   CHOLHDL 3 06/23/2017 1028   VLDL 26.2 06/23/2017 1028   LDLCALC 97 02/17/2018 0844   LDLDIRECT 136.0 11/16/2016 0902   Hepatic Function Panel     Component Value Date/Time   PROT 7.4 02/17/2018 0844   ALBUMIN 4.3 02/17/2018 0844   AST 16 02/17/2018 0844   ALT 29 02/17/2018 0844   ALKPHOS 50 02/17/2018 0844   BILITOT 0.4 02/17/2018 0844   BILIDIR 0.0 02/26/2012 1353      Component Value Date/Time   TSH 1.400 09/08/2017 1144   TSH 1.31 06/23/2017 1028   TSH 1.61 03/02/2014 0847  Results for Napier, Vineeth B "[facility]" (MRN  [id number]) as of 05/09/2018 15:40  Ref. Range 02/17/2018 08:44  Vitamin D, 25-Hydroxy Latest Ref Range: 30.0 - 100.0 ng/mL 34.1    ASSESSMENT AND PLAN: Vitamin D deficiency - Plan: Vitamin D, Ergocalciferol, (DRISDOL) 50000 units CAPS capsule  Essential hypertension - Plan: hydrochlorothiazide (HYDRODIURIL) 25 MG tablet  Prediabetes - Plan: metFORMIN (GLUCOPHAGE) 500 MG tablet  At risk for heart disease  Class 3 severe obesity with serious comorbidity and body mass index (BMI) of 40.0 to 44.9 in adult, unspecified obesity type ([place])  PLAN:  Hypertension We discussed sodium restriction, working on healthy weight loss, and a regular exercise program as the means to achieve improved blood pressure control. Marjorie agreed with this plan and agreed to follow up as directed. We will continue to monitor his blood pressure as well as his progress with the above lifestyle modifications. Jeydan agrees to stop lisinopril, and he agrees to continue taking hydrochlorothiazide 25 mg qd #30 and we will refill for 1 month. He will watch for signs of hypotension as he continues his lifestyle modifications. Larwence agrees to follow up with our clinic in 2 weeks.  Cardiovascular risk counselling Seferino was given extended (15 minutes) coronary artery disease prevention counseling today. He is 57 y.o. male and has risk factors for heart disease including obesity and hypertension. We discussed intensive lifestyle modifications today with an emphasis on specific weight loss instructions and strategies. Pt was also informed of the importance of increasing exercise and decreasing saturated fats to help prevent heart disease.  Vitamin D Deficiency Aalijah was informed that low vitamin D levels contributes to fatigue and are associated with obesity, breast, and colon cancer. Fletcher agrees to continue taking prescription Vit D @50 ,000 IU every week #4 and we  will refill for 1 month. He will follow up for routine  testing of vitamin D, at least 2-3 times per year. He was informed of the risk of over-replacement of vitamin D and agrees to not increase his dose unless he discusses this with [place] first. Chapman agrees to follow up with our clinic in 2 weeks.  Pre-Diabetes Keilen will continue to work on weight loss, exercise, and decreasing simple carbohydrates in his diet to help decrease the risk of diabetes. We dicussed metformin including benefits and risks. He was informed that eating too many simple carbohydrates or too many calories at one sitting increases the likelihood of GI side effects. Leamon agrees to continue taking metformin 500 mg BID #60 and we will refill for 1 month. Caymen agrees to follow up with our clinic in 2 weeks as directed to monitor his progress.  Obesity Natasha is currently in the action stage of change. As such, his goal is to continue with weight loss efforts He has agreed to follow the Category 2 plan Nevaeh has been instructed to work up to a goal of 150 minutes of combined cardio and strengthening exercise per week for weight loss and overall health benefits. We discussed the following Behavioral Modification Strategies today: increasing lean protein intake and work on meal planning and easy cooking plans   Suraj has agreed to follow up with our clinic in 2 weeks. He was informed of the importance of frequent follow up visits to maximize his success with intensive lifestyle modifications for his multiple health conditions.   OBESITY BEHAVIORAL INTERVENTION VISIT  Today's visit was # 16 out of 22.  Starting weight: 326 lbs Starting date: 09/08/17 Today's weight : 321 lbs  Today's date: 05/09/2018 Total lbs lost to date: 5 (Patients must lose 7 lbs in the first 6 months to continue with counseling)   ASK: We discussed the diagnosis of obesity with [name] today and Servando agreed to give [place] permission to discuss obesity behavioral modification therapy  today.  ASSESS: Orval has the diagnosis of obesity and his BMI today is 44.79 Arinze is in the action stage of change   ADVISE: Shahiem was educated on the multiple health risks of obesity as well as the benefit of weight loss to improve his health. He was advised of the need for long term treatment and the importance of lifestyle modifications.  AGREE: Multiple dietary modification options and treatment options were discussed and  Salih agreed to the above obesity treatment plan.   Wilhemena Durie, am acting as transcriptionist for Lacy Duverney, PA-C I, Lacy Duverney [facility], have reviewed this note and agree with its content

## 2018-05-25 ENCOUNTER — Ambulatory Visit (INDEPENDENT_AMBULATORY_CARE_PROVIDER_SITE_OTHER): Payer: 59 | Admitting: Physician Assistant

## 2018-05-25 VITALS — BP 144/75 | HR 75 | Temp 98.1°F | Ht 71.0 in | Wt 321.0 lb

## 2018-05-25 DIAGNOSIS — Z9189 Other specified personal risk factors, not elsewhere classified: Secondary | ICD-10-CM

## 2018-05-25 DIAGNOSIS — E66813 Obesity, class 3: Secondary | ICD-10-CM

## 2018-05-25 DIAGNOSIS — Z6841 Body Mass Index (BMI) 40.0 and over, adult: Secondary | ICD-10-CM | POA: Diagnosis not present

## 2018-05-25 DIAGNOSIS — F3289 Other specified depressive episodes: Secondary | ICD-10-CM

## 2018-05-25 DIAGNOSIS — I10 Essential (primary) hypertension: Secondary | ICD-10-CM | POA: Diagnosis not present

## 2018-05-25 MED ORDER — BUPROPION HCL ER (SR) 150 MG PO TB12: 150.0000 mg | ORAL_TABLET | Freq: Every day | ORAL | 0 refills | Status: DC

## 2018-05-25 NOTE — Progress Notes (Signed)
Office: [phone]  /  Fax: [phone]   HPI:   Chief Complaint: OBESITY Bryan [name] is here to discuss his progress with his obesity treatment plan. He is on the Category 2 plan and is following his eating plan approximately 60 % of the time. He states he is walking and on the elliptical for 30-45 minutes 4-5 times per week. Bryan [name] maintained his weight. He continues to have challenges getting the recommended protein on the meal plan and eats out more. Also, had increase in cravings.  His weight is (!) 321 lb (145.6 kg) today and has not lost weight since his last visit. He has lost 5 lbs since starting treatment with [place].  Hypertension Bryan [name] is a 57 y.o. male with hypertension. Bryan [name]'s blood pressure is elevated, he declines any adjustment of blood pressure medications. He denies chest pain or shortness of breath. He is working weight loss to help control his blood pressure with the goal of decreasing his risk of heart attack and stroke. Bryan [name]'s blood pressure is not currently controlled.  At risk for cardiovascular disease Bryan [name] is at a higher than average risk for cardiovascular disease due to obesity. He currently denies any chest pain.   Depression with emotional eating behaviors Bryan [name] is struggling with emotional eating and using food for comfort to the extent that it is negatively impacting his health. He often snacks when he is not hungry. Bryan [name] sometimes feels he is out of control and then feels guilty that he made poor food choices. He has been working on behavior modification techniques to help reduce his emotional eating and has been somewhat successful. His mood is stable and he shows no sign of suicidal or homicidal ideations.  Depression screen [facility] 2/9 09/08/2017 06/23/2017 05/22/2016  Decreased Interest 1 0 0  Down, Depressed, Hopeless 1 0 0  PHQ - 2 Score 2 0 0  Altered sleeping 3 - -  Tired, decreased energy 2 - -  Change in appetite 2 - -  Feeling  bad or failure about yourself  3 - -  Trouble concentrating 1 - -  Moving slowly or fidgety/restless 0 - -  Suicidal thoughts 0 - -  PHQ-9 Score 13 - -  Difficult doing work/chores Not difficult at all - -    ALLERGIES: Allergies  Allergen Reactions  . Lisinopril Cough    MEDICATIONS: Current Outpatient Medications on File Prior to Visit  Medication Sig Dispense Refill  . aspirin 81 MG tablet Take 81 mg by mouth daily.    [name] atorvastatin (LIPITOR) 20 MG tablet Take 1 tablet (20 mg total) by mouth at bedtime. 90 tablet 3  . azelastine (ASTELIN) 0.1 % nasal spray Place 2 sprays into both nostrils at bedtime as needed for rhinitis. Use in each nostril as directed    . CO ENZYME Q-10 PO Take by mouth daily.    [name] doxycycline (VIBRA-TABS) 100 MG tablet Take 1 tablet (100 mg total) by mouth 2 (two) times daily. 15 tablet 0  . fish oil-omega-3 fatty acids 1000 MG capsule Take 1 g by mouth daily.    . hydrochlorothiazide (HYDRODIURIL) 25 MG tablet Take 1 tablet (25 mg total) by mouth daily. 30 tablet 0  . HYDROcodone-homatropine (HYCODAN) 5-1.5 MG/5ML syrup Take 5 mLs by mouth at bedtime as needed for cough. 120 mL 0  . Melatonin 1 MG CAPS Take 1 mg by mouth at bedtime as needed.     . metFORMIN (GLUCOPHAGE) 500 MG tablet Take 1  tablet (500 mg total) by mouth 2 (two) times daily with a meal. 60 tablet 0  . Multiple Vitamins-Minerals (CENTRUM PO) Take 1 tablet by mouth daily.    Bryan [name] 450 MG CAPS Take 450 mg by mouth at bedtime as needed.     . Vitamin D, Ergocalciferol, (DRISDOL) 50000 units CAPS capsule Take 1 capsule (50,000 Units total) by mouth every 7 (seven) days. 4 capsule 0   No current facility-administered medications on file prior to visit.     PAST MEDICAL HISTORY: Past Medical History:  Diagnosis Date  . Dental crowns present   . Gallbladder problem   . Hyperlipidemia   . Lower back pain   . Nephrolithiasis 2017  . Prediabetes 09/04/2014  . Seasonal allergies     . Sebaceous cyst 04/2013   upper back  . Sleep apnea    uses CPAP nightly  . Urolithiasis 10/2016    PAST SURGICAL HISTORY: Past Surgical History:  Procedure Laterality Date  . ADENOIDECTOMY     as a child  . CHOLECYSTECTOMY  01/12/2012   Procedure: LAPAROSCOPIC CHOLECYSTECTOMY WITH INTRAOPERATIVE CHOLANGIOGRAM;  Surgeon: [name], MD;  Location: [facility];  Service: General;  Laterality: N/A;  . CYST EXCISION     cyst removal from back  . EAR CYST EXCISION N/A 05/04/2013   Procedure: Excision subcutaneous mass posterior neck;  Surgeon: Imogene Burn. [name], MD;  Location: [place];  Service: General;  Laterality: N/A;    SOCIAL HISTORY: Social History   Tobacco Use  . Smoking status: Never Smoker  . Smokeless tobacco: Never Used  Substance Use Topics  . Alcohol use: Yes    Comment: 1 glass wine every other day  . Drug use: No    FAMILY HISTORY: Family History  Problem Relation Age of Onset  . Cancer Mother        ovarian  . Hypertension Mother   . Hyperlipidemia Mother   . Obesity Mother   . Cancer Brother        leukemia  . Diabetes Brother   . Colon cancer Maternal Aunt 38  . Cancer Paternal Grandmother        breast  . Cancer Father 29       renal  . Kidney disease Father   . Sleep apnea Father   . Prostate cancer Neg Hx   . Esophageal cancer Neg Hx   . Rectal cancer Neg Hx   . Stomach cancer Neg Hx   . CAD Neg Hx     ROS: Review of Systems  Constitutional: Negative for weight loss.  Respiratory: Negative for shortness of breath.   Cardiovascular: Negative for chest pain.  Psychiatric/Behavioral: Positive for depression. Negative for suicidal ideas.    PHYSICAL EXAM: Blood pressure (!) 144/75, pulse 75, temperature 98.1 F (36.7 C), temperature source Oral, height 5\' 11"  (1.803 m), weight (!) 321 lb (145.6 kg), SpO2 97 %. Body mass index is 44.77 kg/m. Physical Exam  Constitutional: He is oriented to person, place, and time.  He appears well-developed and well-nourished.  Cardiovascular: Normal rate.  Pulmonary/Chest: Effort normal.  Musculoskeletal: Normal range of motion.  Neurological: He is oriented to person, place, and time.  Skin: Skin is warm and dry.  Psychiatric: He has a normal mood and affect. His behavior is normal.  Vitals reviewed.   RECENT LABS AND TESTS: BMET    Component Value Date/Time   NA 141 02/17/2018 0844   K 4.0 02/17/2018  0844   CL 101 02/17/2018 0844   CO2 25 02/17/2018 0844   GLUCOSE 132 (H) 02/17/2018 0844   GLUCOSE 104 (H) 06/23/2017 1028   BUN 17 02/17/2018 0844   CREATININE 0.90 02/17/2018 0844   CALCIUM 9.4 02/17/2018 0844   GFRNONAA 96 02/17/2018 0844   GFRAA 112 02/17/2018 0844   Lab Results  Component Value Date   HGBA1C 5.9 (H) 02/17/2018   HGBA1C 5.9 (H) 01/05/2018   HGBA1C 5.9 (H) 09/08/2017   HGBA1C 5.9 06/23/2017   HGBA1C 6.0 11/16/2016   Lab Results  Component Value Date   INSULIN 33.0 (H) 02/17/2018   INSULIN 19.4 01/05/2018   INSULIN 21.9 09/08/2017   CBC    Component Value Date/Time   WBC 7.8 09/08/2017 1144   WBC 5.9 06/23/2017 1028   RBC 5.15 09/08/2017 1144   RBC 5.03 06/23/2017 1028   HGB 14.7 09/08/2017 1144   HCT 43.8 09/08/2017 1144   PLT 253.0 06/23/2017 1028   MCV 85 09/08/2017 1144   MCH 28.5 09/08/2017 1144   MCH 28.8 01/13/2012 0328   MCHC 33.6 09/08/2017 1144   MCHC 32.9 06/23/2017 1028   RDW 13.8 09/08/2017 1144   LYMPHSABS 2.2 09/08/2017 1144   MONOABS 0.4 06/23/2017 1028   EOSABS 0.2 09/08/2017 1144   BASOSABS 0.1 09/08/2017 1144   Iron/TIBC/Ferritin/ %Sat No results found for: IRON, TIBC, FERRITIN, IRONPCTSAT Lipid Panel     Component Value Date/Time   CHOL 175 02/17/2018 0844   TRIG 172 (H) 02/17/2018 0844   HDL 44 02/17/2018 0844   CHOLHDL 3 06/23/2017 1028   VLDL 26.2 06/23/2017 1028   LDLCALC 97 02/17/2018 0844   LDLDIRECT 136.0 11/16/2016 0902   Hepatic Function Panel     Component Value Date/Time    PROT 7.4 02/17/2018 0844   ALBUMIN 4.3 02/17/2018 0844   AST 16 02/17/2018 0844   ALT 29 02/17/2018 0844   ALKPHOS 50 02/17/2018 0844   BILITOT 0.4 02/17/2018 0844   BILIDIR 0.0 02/26/2012 1353      Component Value Date/Time   TSH 1.400 09/08/2017 1144   TSH 1.31 06/23/2017 1028   TSH 1.61 03/02/2014 0847    ASSESSMENT AND PLAN: Essential hypertension  At risk for heart disease  Other depression - with emotional eating - Plan: buPROPion (WELLBUTRIN SR) 150 MG 12 hr tablet  Class 3 severe obesity with serious comorbidity and body mass index (BMI) of 40.0 to 44.9 in adult, unspecified obesity type (HCC)  PLAN:  Hypertension We discussed sodium restriction, working on healthy weight loss, and a regular exercise program as the means to achieve improved blood pressure control. Bryan [name] agreed with this plan and agreed to follow up as directed. We will continue to monitor his blood pressure as well as his progress with the above lifestyle modifications. He will continue his medications as prescribed and will watch for signs of hypotension as he continues his lifestyle modifications. Aldyn agrees to follow up with our clinic in 3 weeks.  Cardiovascular risk counselling Bryan [name] was given extended (15 minutes) coronary artery disease prevention counseling today. He is 57 y.o. male and has risk factors for heart disease including obesity. We discussed intensive lifestyle modifications today with an emphasis on specific weight loss instructions and strategies. Pt was also informed of the importance of increasing exercise and decreasing saturated fats to help prevent heart disease.   Depression with Emotional Eating Behaviors We discussed behavior modification techniques today to help Bryan [name] deal with his emotional  eating and depression. Bryan [name] agrees to start Wellbutrin SR 150 mg qd #30 with no refills, and he agrees to follow up with our clinic in 3 weeks.  Obesity Bryan [name] is  currently in the action stage of change. As such, his goal is to continue with weight loss efforts He has agreed to portion control better and make smarter food choices, such as increase vegetables and decrease simple carbohydrates  Bryan [name] has been instructed to work up to a goal of 150 minutes of combined cardio and strengthening exercise per week for weight loss and overall health benefits. We discussed the following Behavioral Modification Strategies today: decrease eating out   Bryan [name] has agreed to follow up with our clinic in 3 weeks. He was informed of the importance of frequent follow up visits to maximize his success with intensive lifestyle modifications for his multiple health conditions.   OBESITY BEHAVIORAL INTERVENTION VISIT  Today's visit was # 17 out of 22.  Starting weight: 326 lbs Starting date: 09/08/17 Today's weight : 321 lbs Today's date: 05/26/2018 Total lbs lost to date: 5 (Patients must lose 7 lbs in the first 6 months to continue with counseling)   ASK: We discussed the diagnosis of obesity with Bryan [name] today and Bryan [name] agreed to give [place] permission to discuss obesity behavioral modification therapy today.  ASSESS: Bryan [name] has the diagnosis of obesity and his BMI today is 44.79 Bryan [name] is in the action stage of change   ADVISE: Bryan [name] was educated on the multiple health risks of obesity as well as the benefit of weight loss to improve his health. He was advised of the need for long term treatment and the importance of lifestyle modifications.  AGREE: Multiple dietary modification options and treatment options were discussed and  Bryan [name] agreed to the above obesity treatment plan.   Bryan [name] Durie, am acting as transcriptionist for Lacy Duverney, PA-C I, Lacy Duverney [facility], have reviewed this note and agree with its content

## 2018-06-15 ENCOUNTER — Ambulatory Visit (INDEPENDENT_AMBULATORY_CARE_PROVIDER_SITE_OTHER): Payer: BC Managed Care – PPO | Admitting: Physician Assistant

## 2018-06-15 VITALS — BP 141/78 | HR 83 | Temp 98.8°F | Ht 71.0 in | Wt 319.0 lb

## 2018-06-15 DIAGNOSIS — Z9189 Other specified personal risk factors, not elsewhere classified: Secondary | ICD-10-CM | POA: Diagnosis not present

## 2018-06-15 DIAGNOSIS — R7303 Prediabetes: Secondary | ICD-10-CM | POA: Diagnosis not present

## 2018-06-15 DIAGNOSIS — E559 Vitamin D deficiency, unspecified: Secondary | ICD-10-CM | POA: Diagnosis not present

## 2018-06-15 DIAGNOSIS — Z6841 Body Mass Index (BMI) 40.0 and over, adult: Secondary | ICD-10-CM

## 2018-06-15 DIAGNOSIS — I10 Essential (primary) hypertension: Secondary | ICD-10-CM

## 2018-06-15 MED ORDER — VITAMIN D (ERGOCALCIFEROL) 1.25 MG (50000 UNIT) PO CAPS: 50000.0000 [IU] | ORAL_CAPSULE | ORAL | 0 refills | Status: DC

## 2018-06-15 MED ORDER — HYDROCHLOROTHIAZIDE 25 MG PO TABS: 25.0000 mg | ORAL_TABLET | Freq: Every day | ORAL | 0 refills | Status: DC

## 2018-06-15 NOTE — Progress Notes (Signed)
Office: [phone]  /  Fax: [phone]   HPI:   Chief Complaint: OBESITY Bryan [name] is here to discuss his progress with his obesity treatment plan. He is on the  portion control better and make smarter food choices plan and is following his eating plan approximately 70 % of the time. He states he is doing elliptical and walking for 30 minutes 3 to 4 times per week. Bryan [name] is back on track with his eating and is making mindful food choices. He would like more meal cooking ideas. His weight is (!) 319 lb (144.7 kg) today and has had a weight [name] of 2 pounds over a period of 3 weeks since his last visit. He has lost 7 lbs since starting treatment with [place].  Hypertension Bryan [name] is a 57 y.o. male with hypertension. His blood pressure is elevated today. He declines adjustment of his blood pressure medications. Bryan [name] denies chest pain or shortness of breath on exertion. He is working weight [name] to help control his blood pressure with the goal of decreasing his risk of heart attack and stroke. Bryan [name] blood pressure is not [name] controlled.  At risk for cardiovascular disease Bryan [name] is at a higher than average risk for cardiovascular disease due to obesity and hypertension. He [name] denies any chest pain.  Vitamin D deficiency Bryan [name] has a diagnosis of vitamin D deficiency. He is [name] taking vit D and denies nausea, vomiting or muscle weakness.  Pre-Diabetes Bryan [name] has a diagnosis of prediabetes based on his elevated Hgb A1c and was informed this puts him at greater risk of developing diabetes. He is taking metformin [name] and continues to work on diet and exercise to decrease risk of diabetes. He denies nausea, polyphagia or hypoglycemia.  ALLERGIES: Allergies  Allergen Reactions  . Lisinopril Cough    MEDICATIONS: Current Outpatient Medications on File Prior to Visit  Medication Sig Dispense Refill  . aspirin 81 MG tablet Take 81 mg by mouth  daily.    [name] atorvastatin (LIPITOR) 20 MG tablet Take 1 tablet (20 mg total) by mouth at bedtime. 90 tablet 3  . azelastine (ASTELIN) 0.1 % nasal spray Place 2 sprays into both nostrils at bedtime as needed for rhinitis. Use in each nostril as directed    . buPROPion (WELLBUTRIN SR) 150 MG 12 hr tablet Take 1 tablet (150 mg total) by mouth daily. 30 tablet 0  . CO ENZYME Q-10 PO Take by mouth daily.    [name] doxycycline (VIBRA-TABS) 100 MG tablet Take 1 tablet (100 mg total) by mouth 2 (two) times daily. 15 tablet 0  . fish oil-omega-3 fatty acids 1000 MG capsule Take 1 g by mouth daily.    . hydrochlorothiazide (HYDRODIURIL) 25 MG tablet Take 1 tablet (25 mg total) by mouth daily. 30 tablet 0  . HYDROcodone-homatropine (HYCODAN) 5-1.5 MG/5ML syrup Take 5 mLs by mouth at bedtime as needed for cough. 120 mL 0  . Melatonin 1 MG CAPS Take 1 mg by mouth at bedtime as needed.     . metFORMIN (GLUCOPHAGE) 500 MG tablet Take 1 tablet (500 mg total) by mouth 2 (two) times daily with a meal. 60 tablet 0  . Multiple Vitamins-Minerals (CENTRUM PO) Take 1 tablet by mouth daily.    Bryan [name] 450 MG CAPS Take 450 mg by mouth at bedtime as needed.     . Vitamin D, Ergocalciferol, (DRISDOL) 50000 units CAPS capsule Take 1 capsule (50,000 Units total) by mouth every 7 (  seven) days. 4 capsule 0   No current facility-administered medications on file prior to visit.     PAST MEDICAL HISTORY: Past Medical History:  Diagnosis Date  . Dental crowns present   . Gallbladder problem   . Hyperlipidemia   . Lower back pain   . Nephrolithiasis 2017  . Prediabetes 09/04/2014  . Seasonal allergies   . Sebaceous cyst 04/2013   upper back  . Sleep apnea    uses CPAP nightly  . Urolithiasis 10/2016    PAST SURGICAL HISTORY: Past Surgical History:  Procedure Laterality Date  . ADENOIDECTOMY     as a child  . CHOLECYSTECTOMY  01/12/2012   Procedure: LAPAROSCOPIC CHOLECYSTECTOMY WITH INTRAOPERATIVE CHOLANGIOGRAM;   Surgeon: [name], MD;  Location: [facility];  Service: General;  Laterality: N/A;  . CYST EXCISION     cyst removal from back  . EAR CYST EXCISION N/A 05/04/2013   Procedure: Excision subcutaneous mass posterior neck;  Surgeon: Imogene Burn. [name], MD;  Location: [place];  Service: General;  Laterality: N/A;    SOCIAL HISTORY: Social History   Tobacco Use  . Smoking status: Never Smoker  . Smokeless tobacco: Never Used  Substance Use Topics  . Alcohol use: Yes    Comment: 1 glass wine every other day  . Drug use: No    FAMILY HISTORY: Family History  Problem Relation Age of Onset  . Cancer Mother        ovarian  . Hypertension Mother   . Hyperlipidemia Mother   . Obesity Mother   . Cancer Brother        leukemia  . Diabetes Brother   . Colon cancer Maternal Aunt 105  . Cancer Paternal Grandmother        breast  . Cancer Father 64       renal  . Kidney disease Father   . Sleep apnea Father   . Prostate cancer Neg Hx   . Esophageal cancer Neg Hx   . Rectal cancer Neg Hx   . Stomach cancer Neg Hx   . CAD Neg Hx     ROS: Review of Systems  Constitutional: Positive for weight [name].  Respiratory: Negative for shortness of breath (on exertion).   Cardiovascular: Negative for chest pain.  Gastrointestinal: Negative for nausea and vomiting.  Musculoskeletal:       Negative for muscle weakness  Endo/Heme/Allergies:       Negative for polyphagia Negative for hypoglycemia    PHYSICAL EXAM: Blood pressure (!) 141/78, pulse 83, temperature 98.8 F (37.1 C), temperature source Oral, height 5\' 11"  (1.803 m), weight (!) 319 lb (144.7 kg), SpO2 96 %. Body mass index is 44.49 kg/m. Physical Exam  Constitutional: He is oriented to person, place, and time. He appears well-developed and well-nourished.  Cardiovascular: Normal rate.  Pulmonary/Chest: Effort normal.  Musculoskeletal: Normal range of motion.  Neurological: He is oriented to person, place,  and time.  Skin: Skin is warm and dry.  Psychiatric: He has a normal mood and affect. His behavior is normal.  Vitals reviewed.   RECENT LABS AND TESTS: BMET    Component Value Date/Time   NA 141 02/17/2018 0844   K 4.0 02/17/2018 0844   CL 101 02/17/2018 0844   CO2 25 02/17/2018 0844   GLUCOSE 132 (H) 02/17/2018 0844   GLUCOSE 104 (H) 06/23/2017 1028   BUN 17 02/17/2018 0844   CREATININE 0.90 02/17/2018 0844   CALCIUM 9.4 02/17/2018  [place] 02/17/2018 0844   GFRAA 112 02/17/2018 0844   Lab Results  Component Value Date   HGBA1C 5.9 (H) 02/17/2018   HGBA1C 5.9 (H) 01/05/2018   HGBA1C 5.9 (H) 09/08/2017   HGBA1C 5.9 06/23/2017   HGBA1C 6.0 11/16/2016   Lab Results  Component Value Date   INSULIN 33.0 (H) 02/17/2018   INSULIN 19.4 01/05/2018   INSULIN 21.9 09/08/2017   CBC    Component Value Date/Time   WBC 7.8 09/08/2017 1144   WBC 5.9 06/23/2017 1028   RBC 5.15 09/08/2017 1144   RBC 5.03 06/23/2017 1028   HGB 14.7 09/08/2017 1144   HCT 43.8 09/08/2017 1144   PLT 253.0 06/23/2017 1028   MCV 85 09/08/2017 1144   MCH 28.5 09/08/2017 1144   MCH 28.8 01/13/2012 0328   MCHC 33.6 09/08/2017 1144   MCHC 32.9 06/23/2017 1028   RDW 13.8 09/08/2017 1144   LYMPHSABS 2.2 09/08/2017 1144   MONOABS 0.4 06/23/2017 1028   EOSABS 0.2 09/08/2017 1144   BASOSABS 0.1 09/08/2017 1144   Iron/TIBC/Ferritin/ %Sat No results found for: IRON, TIBC, FERRITIN, IRONPCTSAT Lipid Panel     Component Value Date/Time   CHOL 175 02/17/2018 0844   TRIG 172 (H) 02/17/2018 0844   HDL 44 02/17/2018 0844   CHOLHDL 3 06/23/2017 1028   VLDL 26.2 06/23/2017 1028   LDLCALC 97 02/17/2018 0844   LDLDIRECT 136.0 11/16/2016 0902   Hepatic Function Panel     Component Value Date/Time   PROT 7.4 02/17/2018 0844   ALBUMIN 4.3 02/17/2018 0844   AST 16 02/17/2018 0844   ALT 29 02/17/2018 0844   ALKPHOS 50 02/17/2018 0844   BILITOT 0.4 02/17/2018 0844   BILIDIR 0.0 02/26/2012 1353       Component Value Date/Time   TSH 1.400 09/08/2017 1144   TSH 1.31 06/23/2017 1028   TSH 1.61 03/02/2014 0847   Results for [place], Brix B "[place]" (MRN [id number]) as of 06/15/2018 14:41  Ref. Range 02/17/2018 08:44  Vitamin D, 25-Hydroxy Latest Ref Range: 30.0 - 100.0 ng/mL 34.1   ASSESSMENT AND PLAN: Essential hypertension - Plan: hydrochlorothiazide (HYDRODIURIL) 25 MG tablet  Vitamin D deficiency - Plan: Vitamin D, Ergocalciferol, (DRISDOL) 50000 units CAPS capsule  Prediabetes  At risk for heart disease  Class 3 severe obesity with serious comorbidity and body mass index (BMI) of 40.0 to 44.9 in adult, unspecified obesity type ([place])  PLAN:  Hypertension We discussed sodium restriction, working on healthy weight [name], and a regular exercise program as the means to achieve improved blood pressure control. Bryan [name] agreed with this plan and agreed to follow up as directed. We will [name] to monitor his blood pressure as well as his progress with the above lifestyle modifications. He  Agrees to [name] HCTZ 25 mg qd #30 with no refills and will watch for signs of hypotension as he continues his lifestyle modifications.  Cardiovascular risk counseling Tamari was given extended (15 minutes) coronary artery disease prevention counseling today. He is 57 y.o. male and has risk factors for heart disease including obesity and hypertension. We discussed intensive lifestyle modifications today with an emphasis on specific weight [name] instructions and strategies. Pt was also informed of the importance of increasing exercise and decreasing saturated fats to help prevent heart disease.  Vitamin D Deficiency Bryan [name] was informed that low vitamin D levels contributes to fatigue and are associated with obesity, breast, and colon cancer. He agrees to [name] to take prescription  Vit D @50 ,000 IU every week #4 with no refills and will follow up for routine testing of vitamin D, at least 2-3  times per year. He was informed of the risk of over-replacement of vitamin D and agrees to not increase his dose unless he discusses this with [place] first. Bryan [name] agrees to follow up as directed.  Pre-Diabetes Bryan [name], exercise, and decreasing simple carbohydrates in his diet to help decrease the risk of diabetes. We dicussed metformin including benefits and risks. He was informed that eating too many simple carbohydrates or too many calories at one sitting increases the likelihood of GI side effects. Aarsh agrees to [name] metformin 500 mg BID #60 with no refills and follow up with [place] as directed to monitor his progress.  Obesity Bryan [name] in the action stage of change. As such, his goal is to [name] with weight [name] efforts He has agreed to portion control better and make smarter food choices, such as increase vegetables and decrease simple carbohydrates  Bryan [name] has been instructed to work up to a goal of 150 minutes of combined cardio and strengthening exercise per week for weight [name] and overall health benefits. We discussed the following Behavioral Modification Strategies today: increasing lean protein intake and planning for success  Bryan [name] has agreed to follow up with our clinic in 3 weeks. He was informed of the importance of frequent follow up visits to maximize his success with intensive lifestyle modifications for his multiple health conditions.   OBESITY BEHAVIORAL INTERVENTION VISIT  Today's visit was # 18 out of 22.  Starting weight: 326 lbs Starting date: 09/08/17 Today's weight : 319 lbs Today's date: 06/15/2018 Total lbs lost to date: 7 (Patients must lose 7 lbs in the first 6 months to [name] with counseling)   ASK: We discussed the diagnosis of obesity with Bryan [name] today and Vipul agreed to give [place] permission to discuss obesity behavioral modification therapy today.  ASSESS: Kijuan has the diagnosis of  obesity and his BMI today is 44.51 Timmey is in the action stage of change   ADVISE: Bryan [name] was educated on the multiple health risks of obesity as well as the benefit of weight [name] to improve his health. He was advised of the need for long term treatment and the importance of lifestyle modifications.  AGREE: Multiple dietary modification options and treatment options were discussed and  Treyson agreed to the above obesity treatment plan.   Corey Skains, am acting as transcriptionist for [employer], PA-C I, Lacy Duverney [facility], have reviewed this note and agree with its content

## 2018-06-25 ENCOUNTER — Other Ambulatory Visit (INDEPENDENT_AMBULATORY_CARE_PROVIDER_SITE_OTHER): Payer: Self-pay | Admitting: Physician Assistant

## 2018-06-25 DIAGNOSIS — F3289 Other specified depressive episodes: Secondary | ICD-10-CM

## 2018-06-29 ENCOUNTER — Encounter: Payer: 59 | Admitting: Internal Medicine

## 2018-07-05 ENCOUNTER — Ambulatory Visit (INDEPENDENT_AMBULATORY_CARE_PROVIDER_SITE_OTHER): Payer: BC Managed Care – PPO | Admitting: Internal Medicine

## 2018-07-05 ENCOUNTER — Encounter: Payer: Self-pay | Admitting: Internal Medicine

## 2018-07-05 VITALS — BP 142/80 | HR 80 | Temp 97.9°F | Resp 16 | Ht 71.0 in | Wt 326.2 lb

## 2018-07-05 DIAGNOSIS — Z0000 Encounter for general adult medical examination without abnormal findings: Secondary | ICD-10-CM | POA: Diagnosis not present

## 2018-07-05 DIAGNOSIS — H6592 Unspecified nonsuppurative otitis media, left ear: Secondary | ICD-10-CM

## 2018-07-05 NOTE — Assessment & Plan Note (Signed)
-  Td 2011; shingrex discussed before  -CCS : +FH Mat. Aunt had colon Ca Colonoscopy: aprox  12/07/2002,  normal . cscope 2015- polyps, next 5 years  -Prostate ca screening : DRE, PSA  wnl 2018 - diet, exercise:  Per wt loss clinic   -Labs:  Reviewed, no need for blood work today.

## 2018-07-05 NOTE — Patient Instructions (Signed)
  GO TO THE FRONT DESK Schedule your next appointment for a  Physical in 1 year 

## 2018-07-05 NOTE — Progress Notes (Signed)
Subjective:    Patient ID: Bryan [name], male    DOB: Jan 24, 1961, 57 y.o.   MRN: [id number]  DOS:  07/05/2018 Type of visit - description : cpx Interval history: Has few concerns, see below  BP Readings from Last 3 Encounters:  07/05/18 (!) 142/80  06/15/18 (!) 141/78  05/25/18 (!) 144/75    Review of Systems For the last several months he continued to feel the left ear having fluid in it, feels muffled, denies ear pain or discharge.  No dizziness.  Other than above, a 14 point review of systems is negative      Past Medical History:  Diagnosis Date  . Dental crowns present   . Hyperlipidemia   . Lower back pain   . Nephrolithiasis 2017  . Prediabetes 09/04/2014  . Seasonal allergies   . Sebaceous cyst 04/2013   upper back  . Sleep apnea    uses CPAP nightly  . Urolithiasis 10/2016    Past Surgical History:  Procedure Laterality Date  . ADENOIDECTOMY     as a child  . CHOLECYSTECTOMY  01/12/2012   Procedure: LAPAROSCOPIC CHOLECYSTECTOMY WITH INTRAOPERATIVE CHOLANGIOGRAM;  Surgeon: [name], MD;  Location: [facility];  Service: General;  Laterality: N/A;  . CYST EXCISION     cyst removal from back  . EAR CYST EXCISION N/A 05/04/2013   Procedure: Excision subcutaneous mass posterior neck;  Surgeon: Imogene Burn. [name], MD;  Location: [facility];  Service: General;  Laterality: N/A;    Social History   Socioeconomic History  . Marital status: Married    Spouse name: Patty  . Number of children: 3  . Years of education: Not on file  . Highest education level: Not on file  Occupational History  . Occupation:  Retail banker, A/C units     Employer: [employer]  Social Needs  . Financial resource strain: Not on file  . Food insecurity:    Worry: Not on file    Inability: Not on file  . Transportation needs:    Medical: Not on file    Non-medical: Not on file  Tobacco Use  . Smoking status: Never Smoker  . Smokeless tobacco: Never  Used  Substance and Sexual Activity  . Alcohol use: Yes    Comment: 1 glass wine every other day  . Drug use: No  . Sexual activity: Not on file  Lifestyle  . Physical activity:    Days per week: Not on file    Minutes per session: Not on file  . Stress: Not on file  Relationships  . Social connections:    Talks on phone: Not on file    Gets together: Not on file    Attends religious service: Not on file    Active member of club or organization: Not on file    Attends meetings of clubs or organizations: Not on file    Relationship status: Not on file  . Intimate partner violence:    Fear of current or ex partner: Not on file    Emotionally abused: Not on file    Physically abused: Not on file    Forced sexual activity: Not on file  Other Topics Concern  . 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 Cancer Mother        ovarian  . Hypertension Mother   . Hyperlipidemia Mother   . Obesity Mother   . Cancer Brother        leukemia  . Diabetes Brother   . Colon cancer Maternal Aunt 38  . Cancer Paternal Grandmother        breast  . Cancer Father 59       renal  . Kidney disease Father   . Sleep apnea Father   . Prostate cancer Neg Hx   . Esophageal cancer Neg Hx   . Rectal cancer Neg Hx   . Stomach cancer Neg Hx   . CAD Neg Hx      Allergies as of 07/05/2018      Reactions   Lisinopril Cough      Medication List        Accurate as of 07/05/18 11:59 PM. Always use your most recent med list.          aspirin 81 MG tablet Take 81 mg by mouth daily.   atorvastatin 20 MG tablet Commonly known as:  LIPITOR Take 1 tablet (20 mg total) by mouth at bedtime.   azelastine 0.1 % nasal spray Commonly known as:  ASTELIN Place 2 sprays into both nostrils at bedtime  as needed for rhinitis. Use in each nostril as directed   buPROPion 150 MG 12 hr tablet Commonly known as:  WELLBUTRIN SR TAKE 1 TABLET(150 MG) BY MOUTH DAILY   CENTRUM PO Take 1 tablet by mouth daily.   CO ENZYME Q-10 PO Take by mouth daily.   fish oil-omega-3 fatty acids 1000 MG capsule Take 1 g by mouth daily.   hydrochlorothiazide 25 MG tablet Commonly known as:  HYDRODIURIL Take 1 tablet (25 mg total) by mouth daily.   HYDROcodone-homatropine 5-1.5 MG/5ML syrup Commonly known as:  HYCODAN Take 5 mLs by mouth at bedtime as needed for cough.   Melatonin 1 MG Caps Take 1 mg by mouth at bedtime as needed.   metFORMIN 500 MG tablet Commonly known as:  GLUCOPHAGE Take 1 tablet (500 mg total) by mouth 2 (two) times daily with a meal.   Valerian Root 450 MG Caps Take 450 mg by mouth at bedtime as needed.   Vitamin D (Ergocalciferol) 50000 units Caps capsule Commonly known as:  DRISDOL Take 1 capsule (50,000 Units total) by mouth every 7 (seven) days.          Objective:   Physical Exam BP (!) 142/80 (BP Location: Left Arm, Patient Position: Sitting, Cuff Size: Normal)   Pulse 80   Temp 97.9 F (36.6 C) (Oral)   Resp 16   Ht 5\' 11"  (1.803 m)   Wt (!) 326 lb 4 oz (148 kg)   SpO2 97%   BMI 45.50 kg/m  General: Well developed, NAD, see BMI.  Neck: No  thyromegaly  HEENT:  Normocephalic . Face symmetric, atraumatic. Right TM normal Left TM: Not red, no discharge but seems bulge.  Canal normal. Lungs:  CTA B Normal respiratory effort, no intercostal retractions, no accessory muscle use. Heart: RRR,  no murmur.  No pretibial edema bilaterally  Abdomen:  Not distended, soft, non-tender. No rebound or rigidity.   Skin: Exposed areas without rash. Not pale. Not jaundice Neurologic:  alert & oriented X3.  Speech normal, gait appropriate for age and unassisted Strength symmetric and appropriate for age.  Psych:  Cognition and judgment appear intact.    Cooperative with normal attention span and concentration.  Behavior appropriate. No anxious or depressed appearing.     Assessment & Plan:    Assessment Prediabetes Hyperlipidemia Insomnia: on OTCs Morbid obesity, BMI 46 OSA, CPAP Seasonal allergies Kidney stone (first) 10-2016  PLAN: Prediabetes, hyperlipidemia, elevated BP, morbid obesity: Working closely with the bariatric clinic, last A1c satisfactory, last LDL satisfactory. His blood pressure is borderline elevated, he was prescribed lisinopril and developed cough.  Currently only on HCTZ. Since he is seen frequently at the bariatric office I recommend to discuss BP with them. Otherwise continue Lipitor, HCTZ, metformin. Serous otitis,left: Ongoing symptoms for several months, refer to ENT RTC 1 year

## 2018-07-05 NOTE — Progress Notes (Signed)
Pre visit review using our clinic review tool, if applicable. No additional management support is needed unless otherwise documented below in the visit note. 

## 2018-07-06 NOTE — Assessment & Plan Note (Signed)
Prediabetes, hyperlipidemia, elevated BP, morbid obesity: Working closely with the bariatric clinic, last A1c satisfactory, last LDL satisfactory. His blood pressure is borderline elevated, he was prescribed lisinopril and developed cough.  Currently only on HCTZ. Since he is seen frequently at the bariatric office I recommend to discuss BP with them. Otherwise continue Lipitor, HCTZ, metformin. Serous otitis,left: Ongoing symptoms for several months, refer to ENT RTC 1 year

## 2018-07-11 ENCOUNTER — Encounter (INDEPENDENT_AMBULATORY_CARE_PROVIDER_SITE_OTHER): Payer: Self-pay | Admitting: Family Medicine

## 2018-07-11 ENCOUNTER — Ambulatory Visit (INDEPENDENT_AMBULATORY_CARE_PROVIDER_SITE_OTHER): Payer: BC Managed Care – PPO | Admitting: Family Medicine

## 2018-07-11 VITALS — BP 133/78 | HR 84 | Temp 97.9°F | Ht 71.0 in | Wt 321.0 lb

## 2018-07-11 DIAGNOSIS — E559 Vitamin D deficiency, unspecified: Secondary | ICD-10-CM | POA: Diagnosis not present

## 2018-07-11 DIAGNOSIS — Z9189 Other specified personal risk factors, not elsewhere classified: Secondary | ICD-10-CM | POA: Diagnosis not present

## 2018-07-11 DIAGNOSIS — R7303 Prediabetes: Secondary | ICD-10-CM | POA: Diagnosis not present

## 2018-07-11 DIAGNOSIS — Z6841 Body Mass Index (BMI) 40.0 and over, adult: Secondary | ICD-10-CM

## 2018-07-11 DIAGNOSIS — I10 Essential (primary) hypertension: Secondary | ICD-10-CM | POA: Diagnosis not present

## 2018-07-11 MED ORDER — INSULIN PEN NEEDLE 32G X 4 MM MISC: 1.0000 | Freq: Every day | 0 refills | Status: DC

## 2018-07-11 MED ORDER — METFORMIN HCL 500 MG PO TABS: 500.0000 mg | ORAL_TABLET | Freq: Two times a day (BID) | ORAL | 0 refills | Status: DC

## 2018-07-11 MED ORDER — VITAMIN D (ERGOCALCIFEROL) 1.25 MG (50000 UNIT) PO CAPS: 50000.0000 [IU] | ORAL_CAPSULE | ORAL | 0 refills | Status: DC

## 2018-07-11 MED ORDER — HYDROCHLOROTHIAZIDE 25 MG PO TABS: 25.0000 mg | ORAL_TABLET | Freq: Every day | ORAL | 0 refills | Status: DC

## 2018-07-11 MED ORDER — LIRAGLUTIDE 18 MG/3ML ~~LOC~~ SOPN: 0.6000 mg | PEN_INJECTOR | Freq: Every day | SUBCUTANEOUS | 0 refills | Status: DC

## 2018-07-12 NOTE — Progress Notes (Signed)
Office: [phone]  /  Fax: [phone]   HPI:   Chief Complaint: OBESITY Bryan [name] is here to discuss his progress with his obesity treatment plan. He is on the portion control better and make smarter food choices, such as increase vegetables and decrease simple carbohydrates and is following his eating plan approximately 65 % of the time. He states he is walking, doing yard work, and on the elliptical for 30 minutes 4 times per week. Bryan [name] has struggled more with meal planning over the summer with change in routine. He especially struggles with evening cravings and emotional eating.  His weight is (!) 321 lb (145.6 kg) today and has gained 2 pounds since his last visit. He has lost 5 lbs since starting treatment with [place].  Hypertension Bryan [name] is a 57 y.o. male with hypertension. Bryan [name]'s blood pressure is controlled on medications. He denies chest pain or dizziness. He is working weight loss to help control his blood pressure with the goal of decreasing his risk of heart attack and stroke.   Vitamin D Deficiency Bryan [name] has a diagnosis of vitamin D deficiency. He is stable on prescription Vit D, not yet at goal. He denies nausea, vomiting or muscle weakness.  Pre-Diabetes Bryan [name] has a diagnosis of pre-diabetes based on his elevated Hgb A1c and was informed this puts him at greater risk of developing diabetes. He is stable on metformin but still struggling with polyphagia. He denies nausea, vomiting, or hypoglycemia. He continues to work on diet and exercise to decrease risk of diabetes.   At risk for diabetes Bryan [name] is at higher than average risk for developing diabetes due to his obesity and pre-diabetes. He currently denies polyuria or polydipsia.  ALLERGIES: Allergies  Allergen Reactions  . Lisinopril Cough    MEDICATIONS: Current Outpatient Medications on File Prior to Visit  Medication Sig Dispense Refill  . aspirin 81 MG tablet Take 81 mg by mouth daily.    [name]  atorvastatin (LIPITOR) 20 MG tablet Take 1 tablet (20 mg total) by mouth at bedtime. 90 tablet 3  . azelastine (ASTELIN) 0.1 % nasal spray Place 2 sprays into both nostrils at bedtime as needed for rhinitis. Use in each nostril as directed    . buPROPion (WELLBUTRIN SR) 150 MG 12 hr tablet TAKE 1 TABLET(150 MG) BY MOUTH DAILY 30 tablet 0  . CO ENZYME Q-10 PO Take by mouth daily.    . fish oil-omega-3 fatty acids 1000 MG capsule Take 1 g by mouth daily.    [name] HYDROcodone-homatropine (HYCODAN) 5-1.5 MG/5ML syrup Take 5 mLs by mouth at bedtime as needed for cough. 120 mL 0  . Melatonin 1 MG CAPS Take 1 mg by mouth at bedtime as needed.     . Multiple Vitamins-Minerals (CENTRUM PO) Take 1 tablet by mouth daily.    Bryan [name] 450 MG CAPS Take 450 mg by mouth at bedtime as needed.      No current facility-administered medications on file prior to visit.     PAST MEDICAL HISTORY: Past Medical History:  Diagnosis Date  . Dental crowns present   . Hyperlipidemia   . Lower back pain   . Nephrolithiasis 2017  . Prediabetes 09/04/2014  . Seasonal allergies   . Sebaceous cyst 04/2013   upper back  . Sleep apnea    uses CPAP nightly  . Urolithiasis 10/2016    PAST SURGICAL HISTORY: Past Surgical History:  Procedure Laterality Date  . ADENOIDECTOMY  as a child  . CHOLECYSTECTOMY  01/12/2012   Procedure: LAPAROSCOPIC CHOLECYSTECTOMY WITH INTRAOPERATIVE CHOLANGIOGRAM;  Surgeon: [name], MD;  Location: [facility];  Service: General;  Laterality: N/A;  . CYST EXCISION     cyst removal from back  . EAR CYST EXCISION N/A 05/04/2013   Procedure: Excision subcutaneous mass posterior neck;  Surgeon: Imogene Burn. [name], MD;  Location: [facility];  Service: General;  Laterality: N/A;    SOCIAL HISTORY: Social History   Tobacco Use  . Smoking status: Never Smoker  . Smokeless tobacco: Never Used  Substance Use Topics  . Alcohol use: Yes    Comment: 1 glass wine every other  day  . Drug use: No    FAMILY HISTORY: Family History  Problem Relation Age of Onset  . Cancer Mother        ovarian  . Hypertension Mother   . Hyperlipidemia Mother   . Obesity Mother   . Cancer Brother        leukemia  . Diabetes Brother   . Colon cancer Maternal Aunt 9  . Cancer Paternal Grandmother        breast  . Cancer Father 22       renal  . Kidney disease Father   . Sleep apnea Father   . Prostate cancer Neg Hx   . Esophageal cancer Neg Hx   . Rectal cancer Neg Hx   . Stomach cancer Neg Hx   . CAD Neg Hx     ROS: Review of Systems  Constitutional: Negative for weight loss.  Cardiovascular: Negative for chest pain.  Gastrointestinal: Negative for nausea and vomiting.  Genitourinary: Negative for frequency.  Musculoskeletal:       Negative muscle weakness  Neurological: Negative for dizziness.  Endo/Heme/Allergies: Negative for polydipsia.       Negative hypoglycemia Positive polyphagia    PHYSICAL EXAM: Blood pressure 133/78, pulse 84, temperature 97.9 F (36.6 C), temperature source Oral, height 5\' 11"  (1.803 Bryan [name]), weight (!) 321 lb (145.6 kg), SpO2 97 %. Body mass index is 44.77 kg/Bryan [name]. Physical Exam  Constitutional: He is oriented to person, place, and time. He appears well-developed and well-nourished.  Cardiovascular: Normal rate.  Pulmonary/Chest: Effort normal.  Musculoskeletal: Normal range of motion.  Neurological: He is oriented to person, place, and time.  Skin: Skin is warm and dry.  Psychiatric: He has a normal mood and affect. His behavior is normal.  Vitals reviewed.   RECENT LABS AND TESTS: BMET    Component Value Date/Time   NA 141 02/17/2018 0844   K 4.0 02/17/2018 0844   CL 101 02/17/2018 0844   CO2 25 02/17/2018 0844   GLUCOSE 132 (H) 02/17/2018 0844   GLUCOSE 104 (H) 06/23/2017 1028   BUN 17 02/17/2018 0844   CREATININE 0.90 02/17/2018 0844   CALCIUM 9.4 02/17/2018 0844   GFRNONAA 96 02/17/2018 0844   GFRAA 112  02/17/2018 0844   Lab Results  Component Value Date   HGBA1C 5.9 (H) 02/17/2018   HGBA1C 5.9 (H) 01/05/2018   HGBA1C 5.9 (H) 09/08/2017   HGBA1C 5.9 06/23/2017   HGBA1C 6.0 11/16/2016   Lab Results  Component Value Date   INSULIN 33.0 (H) 02/17/2018   INSULIN 19.4 01/05/2018   INSULIN 21.9 09/08/2017   CBC    Component Value Date/Time   WBC 7.8 09/08/2017 1144   WBC 5.9 06/23/2017 1028   RBC 5.15 09/08/2017 1144   RBC 5.03 06/23/2017 1028  HGB 14.7 09/08/2017 1144   HCT 43.8 09/08/2017 1144   PLT 253.0 06/23/2017 1028   MCV 85 09/08/2017 1144   MCH 28.5 09/08/2017 1144   MCH 28.8 01/13/2012 0328   MCHC 33.6 09/08/2017 1144   MCHC 32.9 06/23/2017 1028   RDW 13.8 09/08/2017 1144   LYMPHSABS 2.2 09/08/2017 1144   MONOABS 0.4 06/23/2017 1028   EOSABS 0.2 09/08/2017 1144   BASOSABS 0.1 09/08/2017 1144   Iron/TIBC/Ferritin/ %Sat No results found for: IRON, TIBC, FERRITIN, IRONPCTSAT Lipid Panel     Component Value Date/Time   CHOL 175 02/17/2018 0844   TRIG 172 (H) 02/17/2018 0844   HDL 44 02/17/2018 0844   CHOLHDL 3 06/23/2017 1028   VLDL 26.2 06/23/2017 1028   LDLCALC 97 02/17/2018 0844   LDLDIRECT 136.0 11/16/2016 0902   Hepatic Function Panel     Component Value Date/Time   PROT 7.4 02/17/2018 0844   ALBUMIN 4.3 02/17/2018 0844   AST 16 02/17/2018 0844   ALT 29 02/17/2018 0844   ALKPHOS 50 02/17/2018 0844   BILITOT 0.4 02/17/2018 0844   BILIDIR 0.0 02/26/2012 1353      Component Value Date/Time   TSH 1.400 09/08/2017 1144   TSH 1.31 06/23/2017 1028   TSH 1.61 03/02/2014 0847  Results for Bryan [name], Bryan B "Taylor" (MRN [id number]) as of 07/12/2018 11:31  Ref. Range 02/17/2018 08:44  Vitamin D, 25-Hydroxy Latest Ref Range: 30.0 - 100.0 ng/mL 34.1    ASSESSMENT AND PLAN: Essential hypertension - Plan: hydrochlorothiazide (HYDRODIURIL) 25 MG tablet  Vitamin D deficiency - Plan: Vitamin D, Ergocalciferol, (DRISDOL) 50000 units CAPS capsule  Prediabetes  - Plan: metFORMIN (GLUCOPHAGE) 500 MG tablet, liraglutide (VICTOZA) 18 MG/3ML SOPN, Insulin Pen Needle 32G X 4 MM MISC  At risk for diabetes mellitus  Class 3 severe obesity with serious comorbidity and body mass index (BMI) of 40.0 to 44.9 in adult, unspecified obesity type ([place])  PLAN:  Hypertension We discussed sodium restriction, working on healthy weight loss, and a regular exercise program as the means to achieve improved blood pressure control. Bryan [name] agreed with this plan and agreed to follow up as directed. We will continue to monitor his blood pressure as well as his progress with the above lifestyle modifications. Bryan [name] agrees to continue taking hydrochlorothiazide 25 mg qd #30 and we will refill for 1 month. He will watch for signs of hypotension as he continues his lifestyle modifications. Bryan [name] agrees to follow up with our clinic in 2 to 3 weeks.  Vitamin D Deficiency Bryan [name] was informed that low vitamin D levels contributes to fatigue and are associated with obesity, breast, and colon cancer. Bryan [name] agrees to continue taking prescription Vit D @50 ,000 IU every week #4 and we will refill for 1 month. He will follow up for routine testing of vitamin D, at least 2-3 times per year. He was informed of the risk of over-replacement of vitamin D and agrees to not increase his dose unless he discusses this with [place] first. Bryan [name] agrees to follow up with our clinic in 2 to 3 weeks.  Pre-Diabetes Bryan [name] will continue to work on weight loss, exercise, and decreasing simple carbohydrates in his diet to help decrease the risk of diabetes. We dicussed metformin including benefits and risks. He was informed that eating too many simple carbohydrates or too many calories at one sitting increases the likelihood of GI side effects. Bryan [name] agrees to start Victoza 0.6 mg qd #1 pen with no refills and nano needles.  He agrees to continue taking metformin 500 mg BID #60 and we will refill for 1 month.  Bryan [name] agrees to follow up with our clinic in 2 to 3 weeks as directed to monitor his progress.  Diabetes risk counselling Bryan [name] was given extended (15 minutes) diabetes prevention counseling today. He is 57 y.o. male and has risk factors for diabetes including obesity and pre-diabetes. We discussed intensive lifestyle modifications today with an emphasis on weight loss as well as increasing exercise and decreasing simple carbohydrates in his diet.  Obesity Bryan [name] is currently in the action stage of change. As such, his goal is to continue with weight loss efforts He has agreed to follow the Category 3 plan Bryan [name] has been instructed to work up to a goal of 150 minutes of combined cardio and strengthening exercise per week for weight loss and overall health benefits. We discussed the following Behavioral Modification Strategies today: increasing lean protein intake, decrease eating out, dealing with family or coworker sabotage, and no skipping meals   Bryan [name] has agreed to follow up with our clinic in 2 to 3 weeks. He was informed of the importance of frequent follow up visits to maximize his success with intensive lifestyle modifications for his multiple health conditions.   OBESITY BEHAVIORAL INTERVENTION VISIT  Today's visit was # 19 out of 22.  Starting weight: 326 lbs Starting date: 09/08/17 Today's weight : 321 lbs  Today's date: 07/11/2018 Total lbs lost to date: 5    ASK: We discussed the diagnosis of obesity with Bryan [name] today and Bryan [name] agreed to give [place] permission to discuss obesity behavioral modification therapy today.  ASSESS: Bryan [name] has the diagnosis of obesity and his BMI today is 44.79 Mckenna is in the action stage of change   ADVISE: Gearld was educated on the multiple health risks of obesity as well as the benefit of weight loss to improve his health. He was advised of the need for long term treatment and the importance of lifestyle  modifications.  AGREE: Multiple dietary modification options and treatment options were discussed and  Jas agreed to the above obesity treatment plan.  I, Trixie Dredge, am acting as transcriptionist for Dennard Nip, MD  I have reviewed the above documentation for accuracy and completeness, and I agree with the above. -Dennard Nip, MD

## 2018-07-26 DIAGNOSIS — H6522 Chronic serous otitis media, left ear: Secondary | ICD-10-CM | POA: Insufficient documentation

## 2018-07-27 ENCOUNTER — Ambulatory Visit (INDEPENDENT_AMBULATORY_CARE_PROVIDER_SITE_OTHER): Payer: BC Managed Care – PPO | Admitting: Family Medicine

## 2018-07-27 VITALS — BP 123/76 | HR 78 | Temp 98.1°F | Ht 71.0 in | Wt 320.0 lb

## 2018-07-27 DIAGNOSIS — Z9189 Other specified personal risk factors, not elsewhere classified: Secondary | ICD-10-CM | POA: Diagnosis not present

## 2018-07-27 DIAGNOSIS — R7303 Prediabetes: Secondary | ICD-10-CM | POA: Diagnosis not present

## 2018-07-27 DIAGNOSIS — E559 Vitamin D deficiency, unspecified: Secondary | ICD-10-CM | POA: Diagnosis not present

## 2018-07-27 DIAGNOSIS — F3289 Other specified depressive episodes: Secondary | ICD-10-CM | POA: Diagnosis not present

## 2018-07-27 DIAGNOSIS — Z6841 Body Mass Index (BMI) 40.0 and over, adult: Secondary | ICD-10-CM

## 2018-07-27 MED ORDER — METFORMIN HCL 500 MG PO TABS: 500.0000 mg | ORAL_TABLET | Freq: Two times a day (BID) | ORAL | 0 refills | Status: DC

## 2018-07-27 MED ORDER — VITAMIN D (ERGOCALCIFEROL) 1.25 MG (50000 UNIT) PO CAPS: 50000.0000 [IU] | ORAL_CAPSULE | ORAL | 0 refills | Status: DC

## 2018-07-27 MED ORDER — BUPROPION HCL ER (SR) 150 MG PO TB12: ORAL_TABLET | ORAL | 0 refills | Status: DC

## 2018-07-27 NOTE — Progress Notes (Signed)
Office: [phone]  /  Fax: [phone]   HPI:   Chief Complaint: OBESITY Bryan [name] is here to discuss his progress with his obesity treatment plan. He is on the Category 3 plan and is following his eating plan approximately 70 % of the time. He states he is walking and playing tennis for 45 minutes 4 times per week. Bryan [name] has been working on weight loss. He has started playing tennis 1 time per week with his daughter. He still struggles to meal plan and he is working on this. He has increased his H20 intake.  His weight is (!) 320 lb (145.2 kg) today and has had a weight loss of 1 pound over a period of 2 weeks since his last visit. He has lost 6 lbs since starting treatment with [place].  Pre-Diabetes Bryan [name] has a diagnosis of pre-diabetes based on his elevated Hgb A1c and was informed this puts him at greater risk of developing diabetes. He is stable on metformin and Victoza, he denies hypoglycemia and he is due for labs. He continues to work on diet and exercise to decrease risk of diabetes.  Vitamin D Deficiency Bryan [name] has a diagnosis of vitamin D deficiency. He is stable on prescription Vit D, but not yet at goal and he is due for labs. He denies nausea, vomiting or muscle weakness.  Depression with emotional eating behaviors Bryan [name] is stable on Wellbutrin, but he does notes difficulty falling asleep even though he takes the medication in the morning. Bryan [name] struggles with emotional eating and using food for comfort to the extent that it is negatively impacting his health. He often snacks when he is not hungry. Bryan [name] sometimes feels he is out of control and then feels guilty that he made poor food choices. He has been working on behavior modification techniques to help reduce his emotional eating and has been somewhat successful. He shows no sign of suicidal or homicidal ideations.  Depression screen Bryan [name] 2/9 09/08/2017 06/23/2017 05/22/2016  Decreased Interest 1 0 0  Down, Depressed,  Hopeless 1 0 0  PHQ - 2 Score 2 0 0  Altered sleeping 3 - -  Tired, decreased energy 2 - -  Change in appetite 2 - -  Feeling bad or failure about yourself  3 - -  Trouble concentrating 1 - -  Moving slowly or fidgety/restless 0 - -  Suicidal thoughts 0 - -  PHQ-9 Score 13 - -  Difficult doing work/chores Not difficult at all - -    At risk for cardiovascular disease Bryan [name] is at a higher than average risk for cardiovascular disease due to obesity. He currently denies any chest pain.  ALLERGIES: Allergies  Allergen Reactions  . Lisinopril Cough    MEDICATIONS: Current Outpatient Medications on File Prior to Visit  Medication Sig Dispense Refill  . aspirin 81 MG tablet Take 81 mg by mouth daily.    [name] atorvastatin (LIPITOR) 20 MG tablet Take 1 tablet (20 mg total) by mouth at bedtime. 90 tablet 3  . azelastine (ASTELIN) 0.1 % nasal spray Place 2 sprays into both nostrils at bedtime as needed for rhinitis. Use in each nostril as directed    . CO ENZYME Q-10 PO Take by mouth daily.    . fish oil-omega-3 fatty acids 1000 MG capsule Take 1 g by mouth daily.    . hydrochlorothiazide (HYDRODIURIL) 25 MG tablet Take 1 tablet (25 mg total) by mouth daily. 30 tablet 0  . HYDROcodone-homatropine (HYCODAN) 5-1.5 MG/5ML syrup  Take 5 mLs by mouth at bedtime as needed for cough. 120 mL 0  . Insulin Pen Needle 32G X 4 MM MISC 1 each by Does not apply route daily. 50 each 0  . liraglutide (VICTOZA) 18 MG/3ML SOPN Inject 0.1 mLs (0.6 mg total) into the skin daily. 1 pen 0  . Melatonin 1 MG CAPS Take 1 mg by mouth at bedtime as needed.     . Multiple Vitamins-Minerals (CENTRUM PO) Take 1 tablet by mouth daily.    Bryan [name] Bryan [name] 450 MG CAPS Take 450 mg by mouth at bedtime as needed.      No current facility-administered medications on file prior to visit.     PAST MEDICAL HISTORY: Past Medical History:  Diagnosis Date  . Dental crowns present   . Hyperlipidemia   . Lower back pain   .  Nephrolithiasis 2017  . Prediabetes 09/04/2014  . Seasonal allergies   . Sebaceous cyst 04/2013   upper back  . Sleep apnea    uses CPAP nightly  . Urolithiasis 10/2016    PAST SURGICAL HISTORY: Past Surgical History:  Procedure Laterality Date  . ADENOIDECTOMY     as a child  . CHOLECYSTECTOMY  01/12/2012   Procedure: LAPAROSCOPIC CHOLECYSTECTOMY WITH INTRAOPERATIVE CHOLANGIOGRAM;  Surgeon: [name], MD;  Location: [facility];  Service: General;  Laterality: N/A;  . CYST EXCISION     cyst removal from back  . EAR CYST EXCISION N/A 05/04/2013   Procedure: Excision subcutaneous mass posterior neck;  Surgeon: Imogene Burn. [name], MD;  Location: [facility];  Service: General;  Laterality: N/A;    SOCIAL HISTORY: Social History   Tobacco Use  . Smoking status: Never Smoker  . Smokeless tobacco: Never Used  Substance Use Topics  . Alcohol use: Yes    Comment: 1 glass wine every other day  . Drug use: No    FAMILY HISTORY: Family History  Problem Relation Age of Onset  . Cancer Mother        ovarian  . Hypertension Mother   . Hyperlipidemia Mother   . Obesity Mother   . Cancer Brother        leukemia  . Diabetes Brother   . Colon cancer Maternal Aunt 25  . Cancer Paternal Grandmother        breast  . Cancer Father 22       renal  . Kidney disease Father   . Sleep apnea Father   . Prostate cancer Neg Hx   . Esophageal cancer Neg Hx   . Rectal cancer Neg Hx   . Stomach cancer Neg Hx   . CAD Neg Hx     ROS: Review of Systems  Constitutional: Positive for weight loss.  Cardiovascular: Negative for chest pain.  Gastrointestinal: Negative for nausea and vomiting.  Musculoskeletal:       Negative muscle weakness  Endo/Heme/Allergies:       Negative hypoglycemia  Psychiatric/Behavioral: Positive for depression. Negative for suicidal ideas.    PHYSICAL EXAM: Blood pressure 123/76, pulse 78, temperature 98.1 F (36.7 C), temperature source Oral,  height 5\' 11"  (1.803 m), weight (!) 320 lb (145.2 kg), SpO2 95 %. Body mass index is 44.63 kg/m. Physical Exam  Constitutional: He is oriented to person, place, and time. He appears well-developed and well-nourished.  Cardiovascular: Normal rate.  Pulmonary/Chest: Effort normal.  Musculoskeletal: Normal range of motion.  Neurological: He is oriented to person, place, and time.  Skin:  Skin is warm and dry.  Psychiatric: He has a normal mood and affect. His behavior is normal.  Vitals reviewed.   RECENT LABS AND TESTS: BMET    Component Value Date/Time   NA 141 02/17/2018 0844   K 4.0 02/17/2018 0844   CL 101 02/17/2018 0844   CO2 25 02/17/2018 0844   GLUCOSE 132 (H) 02/17/2018 0844   GLUCOSE 104 (H) 06/23/2017 1028   BUN 17 02/17/2018 0844   CREATININE 0.90 02/17/2018 0844   CALCIUM 9.4 02/17/2018 0844   GFRNONAA 96 02/17/2018 0844   GFRAA 112 02/17/2018 0844   Lab Results  Component Value Date   HGBA1C 5.9 (H) 02/17/2018   HGBA1C 5.9 (H) 01/05/2018   HGBA1C 5.9 (H) 09/08/2017   HGBA1C 5.9 06/23/2017   HGBA1C 6.0 11/16/2016   Lab Results  Component Value Date   INSULIN 33.0 (H) 02/17/2018   INSULIN 19.4 01/05/2018   INSULIN 21.9 09/08/2017   CBC    Component Value Date/Time   WBC 7.8 09/08/2017 1144   WBC 5.9 06/23/2017 1028   RBC 5.15 09/08/2017 1144   RBC 5.03 06/23/2017 1028   HGB 14.7 09/08/2017 1144   HCT 43.8 09/08/2017 1144   PLT 253.0 06/23/2017 1028   MCV 85 09/08/2017 1144   MCH 28.5 09/08/2017 1144   MCH 28.8 01/13/2012 0328   MCHC 33.6 09/08/2017 1144   MCHC 32.9 06/23/2017 1028   RDW 13.8 09/08/2017 1144   LYMPHSABS 2.2 09/08/2017 1144   MONOABS 0.4 06/23/2017 1028   EOSABS 0.2 09/08/2017 1144   BASOSABS 0.1 09/08/2017 1144   Iron/TIBC/Ferritin/ %Sat No results found for: IRON, TIBC, FERRITIN, IRONPCTSAT Lipid Panel     Component Value Date/Time   CHOL 175 02/17/2018 0844   TRIG 172 (H) 02/17/2018 0844   HDL 44 02/17/2018 0844    CHOLHDL 3 06/23/2017 1028   VLDL 26.2 06/23/2017 1028   LDLCALC 97 02/17/2018 0844   LDLDIRECT 136.0 11/16/2016 0902   Hepatic Function Panel     Component Value Date/Time   PROT 7.4 02/17/2018 0844   ALBUMIN 4.3 02/17/2018 0844   AST 16 02/17/2018 0844   ALT 29 02/17/2018 0844   ALKPHOS 50 02/17/2018 0844   BILITOT 0.4 02/17/2018 0844   BILIDIR 0.0 02/26/2012 1353      Component Value Date/Time   TSH 1.400 09/08/2017 1144   TSH 1.31 06/23/2017 1028   TSH 1.61 03/02/2014 0847  Results for Bryan [name], Bryan B "Whittemore" (MRN [id number]) as of 07/27/2018 13:05  Ref. Range 02/17/2018 08:44  Vitamin D, 25-Hydroxy Latest Ref Range: 30.0 - 100.0 ng/mL 34.1    ASSESSMENT AND PLAN: Prediabetes - Plan: metFORMIN (GLUCOPHAGE) 500 MG tablet  Vitamin D deficiency - Plan: Vitamin D, Ergocalciferol, (DRISDOL) 50000 units CAPS capsule  Other depression - with emotional eating - Plan: buPROPion (WELLBUTRIN SR) 150 MG 12 hr tablet  At risk for heart disease  Class 3 severe obesity with serious comorbidity and body mass index (BMI) of 45.0 to 49.9 in adult, unspecified obesity type (Bryan [name])  PLAN:  Pre-Diabetes Bryan [name] will continue to work on weight loss, exercise, and decreasing simple carbohydrates in his diet to help decrease the risk of diabetes. We dicussed metformin including benefits and risks. He was informed that eating too many simple carbohydrates or too many calories at one sitting increases the likelihood of GI side effects. Bryan [name] agrees to continue taking metformin 500 mg BID #60 and we will refill for 1 month. Bryan [name] agrees to follow  up with our clinic in 2 to 3 weeks as directed to monitor his progress and we will recheck labs at that time.  Vitamin D Deficiency Bryan [name] was informed that low vitamin D levels contributes to fatigue and are associated with obesity, breast, and colon cancer. Bryan [name] agrees to continue taking prescription Vit D @50 ,000 IU every week #4 and we will refill  for 1 month. He will follow up for routine testing of vitamin D, at least 2-3 times per year. He was informed of the risk of over-replacement of vitamin D and agrees to not increase his dose unless he discusses this with [place] first. Bryan [name] agrees to follow up with our clinic in 2 to 3 weeks and we will recheck labs at that time.  Depression with Emotional Eating Behaviors We discussed behavior modification techniques today to help Bryan [name] deal with his emotional eating and depression. He was advised that his trouble sleeping is unlikely to be related to the medications. Bryan [name] agrees to continue taking Wellbutrin SR 150 mg qd #30 and we will refill for 1 month. Bryan [name] agrees to follow up with our clinic in 2 to 3 weeks.  Cardiovascular risk counselling Bryan [name] was given extended (15 minutes) coronary artery disease prevention counseling today. He is 57 y.o. male and has risk factors for heart disease including obesity. We discussed intensive lifestyle modifications today with an emphasis on specific weight loss instructions and strategies. Pt was also informed of the importance of increasing exercise and decreasing saturated fats to help prevent heart disease.  Obesity Bryan [name] is currently in the action stage of change. As such, his goal is to continue with weight loss efforts He has agreed to follow the Category 3 plan Bryan [name] has been instructed to work up to a goal of 150 minutes of combined cardio and strengthening exercise per week for weight loss and overall health benefits. We discussed the following Behavioral Modification Strategies today: increasing lean protein intake, increasing vegetables and work on meal planning and easy cooking plans   Bryan [name] has agreed to follow up with our clinic in 2 to 3 weeks. He was informed of the importance of frequent follow up visits to maximize his success with intensive lifestyle modifications for his multiple health conditions.   OBESITY BEHAVIORAL  INTERVENTION VISIT  Today's visit was # 20.  Starting weight: 326 lbs Starting date: 09/08/17 Today's weight : 320 lbs Today's date: 07/27/2018 Total lbs lost to date: 6 At least 15 minutes were spent on discussing the following behavioral intervention visit.   ASK: We discussed the diagnosis of obesity with [name] today and Taedyn agreed to give [place] permission to discuss obesity behavioral modification therapy today.  ASSESS: Tayvien has the diagnosis of obesity and his BMI today is 44.65 Magnus is in the action stage of change   ADVISE: Pilar was educated on the multiple health risks of obesity as well as the benefit of weight loss to improve his health. He was advised of the need for long term treatment and the importance of lifestyle modifications to improve his current health and to decrease his risk of future health problems.  AGREE: Multiple dietary modification options and treatment options were discussed and  Kalid agreed to follow the recommendations documented in the above note.  ARRANGE: Sophie was educated on the importance of frequent visits to treat obesity as outlined per CMS and USPSTF guidelines and agreed to schedule his next follow up appointment today.  Wilhemena Durie, am acting as  transcriptionist for Dennard Nip, MD  I have reviewed the above documentation for accuracy and completeness, and I agree with the above. -Dennard Nip, MD

## 2018-08-13 ENCOUNTER — Other Ambulatory Visit (INDEPENDENT_AMBULATORY_CARE_PROVIDER_SITE_OTHER): Payer: Self-pay | Admitting: Family Medicine

## 2018-08-13 DIAGNOSIS — R7303 Prediabetes: Secondary | ICD-10-CM

## 2018-08-13 DIAGNOSIS — I10 Essential (primary) hypertension: Secondary | ICD-10-CM

## 2018-08-18 ENCOUNTER — Ambulatory Visit (INDEPENDENT_AMBULATORY_CARE_PROVIDER_SITE_OTHER): Payer: BC Managed Care – PPO | Admitting: Family Medicine

## 2018-08-18 VITALS — BP 128/83 | HR 71 | Temp 98.0°F | Ht 70.0 in | Wt 322.0 lb

## 2018-08-18 DIAGNOSIS — Z9189 Other specified personal risk factors, not elsewhere classified: Secondary | ICD-10-CM | POA: Diagnosis not present

## 2018-08-18 DIAGNOSIS — R7303 Prediabetes: Secondary | ICD-10-CM | POA: Diagnosis not present

## 2018-08-18 DIAGNOSIS — F3289 Other specified depressive episodes: Secondary | ICD-10-CM

## 2018-08-18 DIAGNOSIS — E559 Vitamin D deficiency, unspecified: Secondary | ICD-10-CM

## 2018-08-18 DIAGNOSIS — Z6841 Body Mass Index (BMI) 40.0 and over, adult: Secondary | ICD-10-CM

## 2018-08-18 MED ORDER — BUPROPION HCL ER (SR) 150 MG PO TB12: ORAL_TABLET | ORAL | 0 refills | Status: DC

## 2018-08-18 MED ORDER — METFORMIN HCL 500 MG PO TABS: 500.0000 mg | ORAL_TABLET | Freq: Two times a day (BID) | ORAL | 0 refills | Status: DC

## 2018-08-18 MED ORDER — LIRAGLUTIDE 18 MG/3ML ~~LOC~~ SOPN: 1.2000 mg | PEN_INJECTOR | Freq: Every morning | SUBCUTANEOUS | 0 refills | Status: DC

## 2018-08-18 MED ORDER — VITAMIN D (ERGOCALCIFEROL) 1.25 MG (50000 UNIT) PO CAPS: 50000.0000 [IU] | ORAL_CAPSULE | ORAL | 0 refills | Status: DC

## 2018-08-19 LAB — CBC WITH DIFFERENTIAL
BASOS ABS: 0.1 10*3/uL (ref 0.0–0.2)
BASOS: 1 %
EOS (ABSOLUTE): 0.2 10*3/uL (ref 0.0–0.4)
Eos: 3 %
HEMOGLOBIN: 14 g/dL (ref 13.0–17.7)
Hematocrit: 42.7 % (ref 37.5–51.0)
IMMATURE GRANS (ABS): 0 10*3/uL (ref 0.0–0.1)
Immature Granulocytes: 1 %
LYMPHS: 25 %
Lymphocytes Absolute: 1.5 10*3/uL (ref 0.7–3.1)
MCH: 28.3 pg (ref 26.6–33.0)
MCHC: 32.8 g/dL (ref 31.5–35.7)
MCV: 86 fL (ref 79–97)
MONOCYTES: 6 %
Monocytes Absolute: 0.4 10*3/uL (ref 0.1–0.9)
NEUTROS ABS: 4 10*3/uL (ref 1.4–7.0)
Neutrophils: 64 %
RBC: 4.95 x10E6/uL (ref 4.14–5.80)
RDW: 13.2 % (ref 12.3–15.4)
WBC: 6.1 10*3/uL (ref 3.4–10.8)

## 2018-08-19 LAB — TSH: TSH: 1.81 u[IU]/mL (ref 0.450–4.500)

## 2018-08-19 LAB — COMPREHENSIVE METABOLIC PANEL
A/G RATIO: 1.5 (ref 1.2–2.2)
ALT: 38 IU/L (ref 0–44)
AST: 18 IU/L (ref 0–40)
Albumin: 4.1 g/dL (ref 3.5–5.5)
Alkaline Phosphatase: 50 IU/L (ref 39–117)
BILIRUBIN TOTAL: 0.3 mg/dL (ref 0.0–1.2)
BUN/Creatinine Ratio: 15 (ref 9–20)
BUN: 13 mg/dL (ref 6–24)
CALCIUM: 9.1 mg/dL (ref 8.7–10.2)
CHLORIDE: 103 mmol/L (ref 96–106)
CO2: 23 mmol/L (ref 20–29)
Creatinine, Ser: 0.85 mg/dL (ref 0.76–1.27)
GFR calc Af Amer: 113 mL/min/{1.73_m2} (ref >59)
GFR, EST NON AFRICAN AMERICAN: 98 mL/min/{1.73_m2} (ref >59)
GLOBULIN, TOTAL: 2.7 g/dL (ref 1.5–4.5)
Glucose: 100 mg/dL — ABNORMAL HIGH (ref 65–99)
POTASSIUM: 4.1 mmol/L (ref 3.5–5.2)
SODIUM: 140 mmol/L (ref 134–144)
Total Protein: 6.8 g/dL (ref 6.0–8.5)

## 2018-08-19 LAB — LIPID PANEL WITH LDL/HDL RATIO
Cholesterol, Total: 133 mg/dL (ref 100–199)
HDL: 40 mg/dL (ref >39)
LDL CALC: 58 mg/dL (ref 0–99)
LDL/HDL RATIO: 1.5 ratio (ref 0.0–3.6)
TRIGLYCERIDES: 177 mg/dL — AB (ref 0–149)
VLDL Cholesterol Cal: 35 mg/dL (ref 5–40)

## 2018-08-19 LAB — VITAMIN D 25 HYDROXY (VIT D DEFICIENCY, FRACTURES): VIT D 25 HYDROXY: 39.8 ng/mL (ref 30.0–100.0)

## 2018-08-19 LAB — INSULIN, RANDOM: INSULIN: 24 u[IU]/mL (ref 2.6–24.9)

## 2018-08-19 LAB — FOLATE: Folate: >20 ng/mL (ref >3.0)

## 2018-08-19 LAB — HEMOGLOBIN A1C
ESTIMATED AVERAGE GLUCOSE: 123 mg/dL
Hgb A1c MFr Bld: 5.9 % — ABNORMAL HIGH (ref 4.8–5.6)

## 2018-08-19 LAB — T4, FREE: FREE T4: 1.53 ng/dL (ref 0.82–1.77)

## 2018-08-19 LAB — VITAMIN B12: Vitamin B-12: 753 pg/mL (ref 232–1245)

## 2018-08-19 LAB — T3: T3, Total: 124 ng/dL (ref 71–180)

## 2018-08-23 NOTE — Progress Notes (Signed)
Office: [phone]  /  Fax: [phone]   HPI:   Chief Complaint: OBESITY Bryan [name] is here to discuss his progress with his obesity treatment plan. He is on the Category 3 plan and is following his eating plan approximately 70 % of the time. He states he is walking, playing tennis and exercising on the elliptical 45 minutes 3 times per week. Bryan [name] has struggled to follow the category 3 plan this summer. Now that his kids are back in school, he feels that meal planning will improve. His weight is (!) 322 lb (146.1 kg) today and has not lost weight since his last visit. He has lost 4 lbs since starting treatment with [place].  Vitamin D deficiency Bryan [name] has a diagnosis of vitamin D deficiency. He is stable on vit D and denies nausea, vomiting or muscle weakness.  Pre-Diabetes Bryan [name] has a diagnosis of prediabetes based on his elevated Hgb A1c and was informed this puts him at greater risk of developing diabetes. He is stable on metformin and he started victoza at 0.6 mg and notes some initial GI upset, but this has now resolved. Bryan [name] notes feeling full sooner and he has decreased polyphagia. Bryan [name] continues to work on diet and exercise to decrease risk of diabetes. He denies hypoglycemia.  At risk for diabetes Bryan [name] is at higher than average risk for developing diabetes due to his obesity and prediabetes. He currently denies polyuria or polydipsia.  Depression with emotional eating behaviors Bryan [name] is still struggling with meal prep and planning, but he feels that emotional eating has decreased. Bryan [name] struggles with emotional eating and using food for comfort to the extent that it is negatively impacting his health. He often snacks when he is not hungry. Bryan [name] sometimes feels he is out of control and then feels guilty that he made poor food choices. He has been working on behavior modification techniques to help reduce his emotional eating and has been somewhat successful. His  mood is stable and he shows no sign of suicidal or homicidal ideations.  Depression screen Bryan [name] 2/9 09/08/2017 06/23/2017 05/22/2016  Decreased Interest 1 0 0  Down, Depressed, Hopeless 1 0 0  PHQ - 2 Score 2 0 0  Altered sleeping 3 - -  Tired, decreased energy 2 - -  Change in appetite 2 - -  Feeling bad or failure about yourself  3 - -  Trouble concentrating 1 - -  Moving slowly or fidgety/restless 0 - -  Suicidal thoughts 0 - -  PHQ-9 Score 13 - -  Difficult doing work/chores Not difficult at all - -     ALLERGIES: Allergies  Allergen Reactions  . Lisinopril Cough    MEDICATIONS: Current Outpatient Medications on File Prior to Visit  Medication Sig Dispense Refill  . aspirin 81 MG tablet Take 81 mg by mouth daily.    [name] atorvastatin (LIPITOR) 20 MG tablet Take 1 tablet (20 mg total) by mouth at bedtime. 90 tablet 3  . azelastine (ASTELIN) 0.1 % nasal spray Place 2 sprays into both nostrils at bedtime as needed for rhinitis. Use in each nostril as directed    . CO ENZYME Q-10 PO Take by mouth daily.    . fish oil-omega-3 fatty acids 1000 MG capsule Take 1 g by mouth daily.    . hydrochlorothiazide (HYDRODIURIL) 25 MG tablet TAKE 1 TABLET(25 MG) BY MOUTH DAILY 30 tablet 0  . HYDROcodone-homatropine (HYCODAN) 5-1.5 MG/5ML syrup Take 5 mLs by mouth at bedtime as needed for  cough. 120 mL 0  . Insulin Pen Needle 32G X 4 MM MISC 1 each by Does not apply route daily. 50 each 0  . Melatonin 1 MG CAPS Take 1 mg by mouth at bedtime as needed.     . Multiple Vitamins-Minerals (CENTRUM PO) Take 1 tablet by mouth daily.    Bryan [name] 450 MG CAPS Take 450 mg by mouth at bedtime as needed.      No current facility-administered medications on file prior to visit.     PAST MEDICAL HISTORY: Past Medical History:  Diagnosis Date  . Dental crowns present   . Hyperlipidemia   . Lower back pain   . Nephrolithiasis 2017  . Prediabetes 09/04/2014  . Seasonal allergies   . Sebaceous cyst  04/2013   upper back  . Sleep apnea    uses CPAP nightly  . Urolithiasis 10/2016    PAST SURGICAL HISTORY: Past Surgical History:  Procedure Laterality Date  . ADENOIDECTOMY     as a child  . CHOLECYSTECTOMY  01/12/2012   Procedure: LAPAROSCOPIC CHOLECYSTECTOMY WITH INTRAOPERATIVE CHOLANGIOGRAM;  Surgeon: [name], MD;  Location: [facility];  Service: General;  Laterality: N/A;  . CYST EXCISION     cyst removal from back  . EAR CYST EXCISION N/A 05/04/2013   Procedure: Excision subcutaneous mass posterior neck;  Surgeon: Imogene Burn. [name], MD;  Location: [facility];  Service: General;  Laterality: N/A;    SOCIAL HISTORY: Social History   Tobacco Use  . Smoking status: Never Smoker  . Smokeless tobacco: Never Used  Substance Use Topics  . Alcohol use: Yes    Comment: 1 glass wine every other day  . Drug use: No    FAMILY HISTORY: Family History  Problem Relation Age of Onset  . Cancer Mother        ovarian  . Hypertension Mother   . Hyperlipidemia Mother   . Obesity Mother   . Cancer Brother        leukemia  . Diabetes Brother   . Colon cancer Maternal Aunt 25  . Cancer Paternal Grandmother        breast  . Cancer Father 7       renal  . Kidney disease Father   . Sleep apnea Father   . Prostate cancer Neg Hx   . Esophageal cancer Neg Hx   . Rectal cancer Neg Hx   . Stomach cancer Neg Hx   . CAD Neg Hx     ROS: Review of Systems  Constitutional: Negative for weight loss.  Gastrointestinal: Negative for diarrhea, nausea and vomiting.  Genitourinary: Negative for frequency.  Musculoskeletal:       Negative for muscle weakness  Endo/Heme/Allergies: Negative for polydipsia.       Positive for polyphagia Negative for hypoglycemia    PHYSICAL EXAM: Blood pressure 128/83, pulse 71, temperature 98 F (36.7 C), temperature source Oral, height 5\' 10"  (1.778 m), weight (!) 322 lb (146.1 kg), SpO2 97 %. Body mass index is 46.2 kg/m. Physical  Exam  Constitutional: He is oriented to person, place, and time. He appears well-developed and well-nourished.  Cardiovascular: Normal rate.  Pulmonary/Chest: Effort normal.  Musculoskeletal: Normal range of motion.  Neurological: He is oriented to person, place, and time.  Skin: Skin is warm and dry.  Psychiatric: He has a normal mood and affect. His behavior is normal.  Vitals reviewed.   RECENT LABS AND TESTS: BMET  Component Value Date/Time   NA 140 08/18/2018 0830   K 4.1 08/18/2018 0830   CL 103 08/18/2018 0830   CO2 23 08/18/2018 0830   GLUCOSE 100 (H) 08/18/2018 0830   GLUCOSE 104 (H) 06/23/2017 1028   BUN 13 08/18/2018 0830   CREATININE 0.85 08/18/2018 0830   CALCIUM 9.1 08/18/2018 0830   GFRNONAA 98 08/18/2018 0830   GFRAA 113 08/18/2018 0830   Lab Results  Component Value Date   HGBA1C 5.9 (H) 08/18/2018   HGBA1C 5.9 (H) 02/17/2018   HGBA1C 5.9 (H) 01/05/2018   HGBA1C 5.9 (H) 09/08/2017   HGBA1C 5.9 06/23/2017   Lab Results  Component Value Date   INSULIN 24.0 08/18/2018   INSULIN 33.0 (H) 02/17/2018   INSULIN 19.4 01/05/2018   INSULIN 21.9 09/08/2017   CBC    Component Value Date/Time   WBC 6.1 08/18/2018 0830   WBC 5.9 06/23/2017 1028   RBC 4.95 08/18/2018 0830   RBC 5.03 06/23/2017 1028   HGB 14.0 08/18/2018 0830   HCT 42.7 08/18/2018 0830   PLT 253.0 06/23/2017 1028   MCV 86 08/18/2018 0830   MCH 28.3 08/18/2018 0830   MCH 28.8 01/13/2012 0328   MCHC 32.8 08/18/2018 0830   MCHC 32.9 06/23/2017 1028   RDW 13.2 08/18/2018 0830   LYMPHSABS 1.5 08/18/2018 0830   MONOABS 0.4 06/23/2017 1028   EOSABS 0.2 08/18/2018 0830   BASOSABS 0.1 08/18/2018 0830   Iron/TIBC/Ferritin/ %Sat No results found for: IRON, TIBC, FERRITIN, IRONPCTSAT Lipid Panel     Component Value Date/Time   CHOL 133 08/18/2018 0830   TRIG 177 (H) 08/18/2018 0830   HDL 40 08/18/2018 0830   CHOLHDL 3 06/23/2017 1028   VLDL 26.2 06/23/2017 1028   LDLCALC 58 08/18/2018  0830   LDLDIRECT 136.0 11/16/2016 0902   Hepatic Function Panel     Component Value Date/Time   PROT 6.8 08/18/2018 0830   ALBUMIN 4.1 08/18/2018 0830   AST 18 08/18/2018 0830   ALT 38 08/18/2018 0830   ALKPHOS 50 08/18/2018 0830   BILITOT 0.3 08/18/2018 0830   BILIDIR 0.0 02/26/2012 1353      Component Value Date/Time   TSH 1.810 08/18/2018 0830   TSH 1.400 09/08/2017 1144   TSH 1.31 06/23/2017 1028   Results for Doland, Avyukt B "[place]" (MRN [id number]) as of 08/23/2018 08:33  Ref. Range 08/18/2018 08:30  Vitamin D, 25-Hydroxy Latest Ref Range: 30.0 - 100.0 ng/mL 39.8   ASSESSMENT AND PLAN: Vitamin D deficiency - Plan: VITAMIN D 25 Hydroxy (Vit-D Deficiency, Fractures), Vitamin D, Ergocalciferol, (DRISDOL) 50000 units CAPS capsule  Prediabetes - Plan: Vitamin B12, CBC With Differential, Folate, Hemoglobin A1c, Insulin, random, Lipid Panel With LDL/HDL Ratio, T3, T4, free, TSH, Comprehensive metabolic panel, metFORMIN (GLUCOPHAGE) 500 MG tablet, liraglutide (VICTOZA) 18 MG/3ML SOPN  Other depression - with emotional eating - Plan: buPROPion (WELLBUTRIN SR) 150 MG 12 hr tablet  At risk for diabetes mellitus  Class 3 severe obesity with serious comorbidity and body mass index (BMI) of 45.0 to 49.9 in adult, unspecified obesity type (HCC)  PLAN:  Vitamin D Deficiency Josel was informed that low vitamin D levels contributes to fatigue and are associated with obesity, breast, and colon cancer. He agrees to continue to take prescription Vit D @50 ,000 IU every week #4 with no refills and will follow up for routine testing of vitamin D, at least 2-3 times per year. He was informed of the risk of over-replacement of vitamin  D and agrees to not increase his dose unless he discusses this with [place] first. We will check labs and Bryan [name] agrees to follow up as directed.  Pre-Diabetes Bryan [name] will continue to work on weight loss, exercise, and decreasing simple carbohydrates in his diet to  help decrease the risk of diabetes. We dicussed metformin including benefits and risks. He was informed that eating too many simple carbohydrates or too many calories at one sitting increases the likelihood of GI side effects. Bryan [name] requested metformin for now and a prescription was written today for 1 month refill. Bryan [name] agrees to continue victoza to 1.2 mg and follow up with [place] as directed to monitor his progress.  Diabetes risk counseling Bryan [name] was given extended (15 minutes) diabetes prevention counseling today. He is 57 y.o. male and has risk factors for diabetes including obesity and prediabetes. We discussed intensive lifestyle modifications today with an emphasis on weight loss as well as increasing exercise and decreasing simple carbohydrates in his diet.  Depression with Emotional Eating Behaviors We discussed behavior modification techniques today to help Bryan [name] deal with his emotional eating and depression. He has agreed to take Wellbutrin SR 150 mg qd #30 with no refills and follow up as directed.  Obesity Bryan [name] is currently in the action stage of change. As such, his goal is to continue with weight loss efforts He has agreed to follow the Category 3 plan Bryan [name] has been instructed to work up to a goal of 150 minutes of combined cardio and strengthening exercise per week for weight loss and overall health benefits. We discussed the following Behavioral Modification Strategies today: work on meal planning and easy cooking plans  Bryan [name] has agreed to follow up with our clinic in 2 to 3 weeks. He was informed of the importance of frequent follow up visits to maximize his success with intensive lifestyle modifications for his multiple health conditions.   OBESITY BEHAVIORAL INTERVENTION VISIT  Today's visit was # 21   Starting weight: 326 lbs Starting date: 09/08/17 Today's weight : 322 lbs  Today's date: 08/18/2018 Total lbs lost to date: 4   ASK: We discussed the  diagnosis of obesity with Bryan [name] today and Munir agreed to give [place] permission to discuss obesity behavioral modification therapy today.  ASSESS: Shyloh has the diagnosis of obesity and his BMI today is 46.2 Lincoln is in the action stage of change   ADVISE: Norlan was educated on the multiple health risks of obesity as well as the benefit of weight loss to improve his health. He was advised of the need for long term treatment and the importance of lifestyle modifications to improve his current health and to decrease his risk of future health problems.  AGREE: Multiple dietary modification options and treatment options were discussed and  Kyshaun agreed to follow the recommendations documented in the above note.  ARRANGE: Charan was educated on the importance of frequent visits to treat obesity as outlined per CMS and USPSTF guidelines and agreed to schedule his next follow up appointment today.  I, Doreene Nest, am acting as transcriptionist for Dennard Nip, MD  I have reviewed the above documentation for accuracy and completeness, and I agree with the above. -Dennard Nip, MD

## 2018-08-31 ENCOUNTER — Other Ambulatory Visit: Payer: Self-pay | Admitting: Internal Medicine

## 2018-09-08 ENCOUNTER — Ambulatory Visit (INDEPENDENT_AMBULATORY_CARE_PROVIDER_SITE_OTHER): Payer: BC Managed Care – PPO | Admitting: Family Medicine

## 2018-09-08 VITALS — BP 145/77 | HR 83 | Temp 97.9°F | Ht 70.0 in | Wt 315.0 lb

## 2018-09-08 DIAGNOSIS — F3289 Other specified depressive episodes: Secondary | ICD-10-CM | POA: Diagnosis not present

## 2018-09-08 DIAGNOSIS — E559 Vitamin D deficiency, unspecified: Secondary | ICD-10-CM | POA: Diagnosis not present

## 2018-09-08 DIAGNOSIS — Z9189 Other specified personal risk factors, not elsewhere classified: Secondary | ICD-10-CM | POA: Diagnosis not present

## 2018-09-08 DIAGNOSIS — Z6841 Body Mass Index (BMI) 40.0 and over, adult: Secondary | ICD-10-CM

## 2018-09-08 MED ORDER — BUPROPION HCL ER (SR) 150 MG PO TB12: ORAL_TABLET | ORAL | 0 refills | Status: DC

## 2018-09-08 MED ORDER — VITAMIN D (ERGOCALCIFEROL) 1.25 MG (50000 UNIT) PO CAPS: 50000.0000 [IU] | ORAL_CAPSULE | ORAL | 0 refills | Status: DC

## 2018-09-12 NOTE — Progress Notes (Signed)
Office: [phone]  /  Fax: [phone]   HPI:   Chief Complaint: OBESITY Bryan [name] is here to discuss his progress with his obesity treatment plan. He is on the Category 3 plan and is following his eating plan approximately 85 % of the time. He states he is doing the elliptical machine for 40 minutes 3 to 4 times per week. Bryan [name] has done better with weight loss with his kids back in school. He notes hunger is better controlled and he notes decreased cravings.  His weight is (!) 315 lb (142.9 kg) today and has had a weight loss of 7 pounds over a period of 3 weeks since his last visit. He has lost 11 lbs since starting treatment with [place].  Vitamin D deficiency Bryan [name] has a diagnosis of vitamin D deficiency. His vitamin D level is slowly improving, but is still not at goal. He is currently taking vitamin D and denies nausea, vomiting or muscle weakness.  At risk for cardiovascular disease Bryan [name] is at a higher than average risk for cardiovascular disease due to vitamin D and obesity.   Depression with emotional eating behaviors Bryan [name] is struggling with emotional eating and using food for comfort to the extent that it is negatively impacting his health. He is stable with decreased emotional eating. His blood pressure is slightly elevated today, but is normally controlled.  Depression screen [name] 2/9 09/08/2017 06/23/2017 05/22/2016  Decreased Interest 1 0 0  Down, Depressed, Hopeless 1 0 0  PHQ - 2 Score 2 0 0  Altered sleeping 3 - -  Tired, decreased energy 2 - -  Change in appetite 2 - -  Feeling bad or failure about yourself  3 - -  Trouble concentrating 1 - -  Moving slowly or fidgety/restless 0 - -  Suicidal thoughts 0 - -  PHQ-9 Score 13 - -  Difficult doing work/chores Not difficult at all - -   ALLERGIES: Allergies  Allergen Reactions  . Lisinopril Cough    MEDICATIONS: Current Outpatient Medications on File Prior to Visit  Medication Sig Dispense Refill  .  aspirin 81 MG tablet Take 81 mg by mouth daily.    [name] atorvastatin (LIPITOR) 20 MG tablet Take 1 tablet (20 mg total) by mouth at bedtime. 90 tablet 3  . azelastine (ASTELIN) 0.1 % nasal spray Place 2 sprays into both nostrils at bedtime as needed for rhinitis. Use in each nostril as directed    . CO ENZYME Q-10 PO Take by mouth daily.    . fish oil-omega-3 fatty acids 1000 MG capsule Take 1 g by mouth daily.    . hydrochlorothiazide (HYDRODIURIL) 25 MG tablet TAKE 1 TABLET(25 MG) BY MOUTH DAILY 30 tablet 0  . HYDROcodone-homatropine (HYCODAN) 5-1.5 MG/5ML syrup Take 5 mLs by mouth at bedtime as needed for cough. 120 mL 0  . Insulin Pen Needle 32G X 4 MM MISC 1 each by Does not apply route daily. 50 each 0  . liraglutide (VICTOZA) 18 MG/3ML SOPN Inject 0.2 mLs (1.2 mg total) into the skin every morning. 2 pen 0  . Melatonin 1 MG CAPS Take 1 mg by mouth at bedtime as needed.     . metFORMIN (GLUCOPHAGE) 500 MG tablet Take 1 tablet (500 mg total) by mouth 2 (two) times daily with a meal. 60 tablet 0  . Multiple Vitamins-Minerals (CENTRUM PO) Take 1 tablet by mouth daily.    Bryan [name] Root 450 MG CAPS Take 450 mg by mouth at  bedtime as needed.      No current facility-administered medications on file prior to visit.     PAST MEDICAL HISTORY: Past Medical History:  Diagnosis Date  . Dental crowns present   . Hyperlipidemia   . Lower back pain   . Nephrolithiasis 2017  . Prediabetes 09/04/2014  . Seasonal allergies   . Sebaceous cyst 04/2013   upper back  . Sleep apnea    uses CPAP nightly  . Urolithiasis 10/2016    PAST SURGICAL HISTORY: Past Surgical History:  Procedure Laterality Date  . ADENOIDECTOMY     as a child  . CHOLECYSTECTOMY  01/12/2012   Procedure: LAPAROSCOPIC CHOLECYSTECTOMY WITH INTRAOPERATIVE CHOLANGIOGRAM;  Surgeon: Bryan [name]. Tsuei, MD;  Location: [facility];  Service: General;  Laterality: N/A;  . CYST EXCISION     cyst removal from back  . EAR CYST EXCISION N/A  05/04/2013   Procedure: Excision subcutaneous mass posterior neck;  Surgeon: Bryan [name]. Bryan Dover, MD;  Location: [place];  Service: General;  Laterality: N/A;    SOCIAL HISTORY: Social History   Tobacco Use  . Smoking status: Never Smoker  . Smokeless tobacco: Never Used  Substance Use Topics  . Alcohol use: Yes    Comment: 1 glass wine every other day  . Drug use: No    FAMILY HISTORY: Family History  Problem Relation Age of Onset  . Cancer Mother        ovarian  . Hypertension Mother   . Hyperlipidemia Mother   . Obesity Mother   . Cancer Brother        leukemia  . Diabetes Brother   . Colon cancer Maternal Aunt 107  . Cancer Paternal Grandmother        breast  . Cancer Father 57       renal  . Kidney disease Father   . Sleep apnea Father   . Prostate cancer Neg Hx   . Esophageal cancer Neg Hx   . Rectal cancer Neg Hx   . Stomach cancer Neg Hx   . CAD Neg Hx     ROS: Review of Systems  Constitutional: Positive for weight loss.  Gastrointestinal: Negative for nausea and vomiting.  Musculoskeletal:       Negative for muscle weakness.  Psychiatric/Behavioral: Positive for depression.    PHYSICAL EXAM: Blood pressure (!) 145/77, pulse 83, temperature 97.9 F (36.6 C), temperature source Oral, height 5\' 10"  (1.778 m), weight (!) 315 lb (142.9 kg), SpO2 97 %. Body mass index is 45.2 kg/m. Physical Exam  Constitutional: He is oriented to person, place, and time. He appears well-developed and well-nourished.  Cardiovascular: Normal rate.  Pulmonary/Chest: Effort normal.  Musculoskeletal: Normal range of motion.  Neurological: He is alert and oriented to person, place, and time.  Skin: Skin is warm and dry.  Psychiatric: He has a normal mood and affect. His behavior is normal.  Vitals reviewed.   RECENT LABS AND TESTS: BMET    Component Value Date/Time   NA 140 08/18/2018 0830   K 4.1 08/18/2018 0830   CL 103 08/18/2018 0830   CO2 23  08/18/2018 0830   GLUCOSE 100 (H) 08/18/2018 0830   GLUCOSE 104 (H) 06/23/2017 1028   BUN 13 08/18/2018 0830   CREATININE 0.85 08/18/2018 0830   CALCIUM 9.1 08/18/2018 0830   GFRNONAA 98 08/18/2018 0830   GFRAA 113 08/18/2018 0830   Lab Results  Component Value Date   HGBA1C 5.9 (H) 08/18/2018  HGBA1C 5.9 (H) 02/17/2018   HGBA1C 5.9 (H) 01/05/2018   HGBA1C 5.9 (H) 09/08/2017   HGBA1C 5.9 06/23/2017   Lab Results  Component Value Date   INSULIN 24.0 08/18/2018   INSULIN 33.0 (H) 02/17/2018   INSULIN 19.4 01/05/2018   INSULIN 21.9 09/08/2017   CBC    Component Value Date/Time   WBC 6.1 08/18/2018 0830   WBC 5.9 06/23/2017 1028   RBC 4.95 08/18/2018 0830   RBC 5.03 06/23/2017 1028   HGB 14.0 08/18/2018 0830   HCT 42.7 08/18/2018 0830   PLT 253.0 06/23/2017 1028   MCV 86 08/18/2018 0830   MCH 28.3 08/18/2018 0830   MCH 28.8 01/13/2012 0328   MCHC 32.8 08/18/2018 0830   MCHC 32.9 06/23/2017 1028   RDW 13.2 08/18/2018 0830   LYMPHSABS 1.5 08/18/2018 0830   MONOABS 0.4 06/23/2017 1028   EOSABS 0.2 08/18/2018 0830   BASOSABS 0.1 08/18/2018 0830   Iron/TIBC/Ferritin/ %Sat No results found for: IRON, TIBC, FERRITIN, IRONPCTSAT Lipid Panel     Component Value Date/Time   CHOL 133 08/18/2018 0830   TRIG 177 (H) 08/18/2018 0830   HDL 40 08/18/2018 0830   CHOLHDL 3 06/23/2017 1028   VLDL 26.2 06/23/2017 1028   LDLCALC 58 08/18/2018 0830   LDLDIRECT 136.0 11/16/2016 0902   Hepatic Function Panel     Component Value Date/Time   PROT 6.8 08/18/2018 0830   ALBUMIN 4.1 08/18/2018 0830   AST 18 08/18/2018 0830   ALT 38 08/18/2018 0830   ALKPHOS 50 08/18/2018 0830   BILITOT 0.3 08/18/2018 0830   BILIDIR 0.0 02/26/2012 1353      Component Value Date/Time   TSH 1.810 08/18/2018 0830   TSH 1.400 09/08/2017 1144   TSH 1.31 06/23/2017 1028   Results for Steege, Yannis B "[place]" (MRN [id number]) as of 09/12/2018 09:52  Ref. Range 08/18/2018 08:30  Vitamin D, 25-Hydroxy  Latest Ref Range: 30.0 - 100.0 ng/mL 39.8   ASSESSMENT AND PLAN: Vitamin D deficiency - Plan: Vitamin D, Ergocalciferol, (DRISDOL) 50000 units CAPS capsule  Other depression - with emotional eating - Plan: buPROPion (WELLBUTRIN SR) 150 MG 12 hr tablet  At risk for heart disease  Class 3 severe obesity with serious comorbidity and body mass index (BMI) of 45.0 to 49.9 in adult, unspecified obesity type ([place])  PLAN:  Vitamin D Deficiency Dominik was informed that low vitamin D levels contributes to fatigue and are associated with obesity, breast, and colon cancer. He agrees to continue to take prescription Vit D @50 ,000 IU every week #4 with no refills and will follow up for routine testing of vitamin D, at least 2-3 times per year. He was informed of the risk of over-replacement of vitamin D and agrees to not increase his dose unless he discusses this with [place] first. Ronda agrees to follow up in 3 weeks.  Cardiovascular risk counseling Christien was given extended (15 minutes) coronary artery disease prevention counseling today. He is 57 y.o. male and has risk factors for heart disease including vitamin D and obesity. We discussed intensive lifestyle modifications today with an emphasis on specific weight loss instructions and strategies. Pt was also informed of the importance of increasing exercise and decreasing saturated fats to help prevent heart disease.  Depression with Emotional Eating Behaviors We discussed behavior modification techniques today to help Drue deal with his emotional eating and depression. He has agreed to continue to take Wellbutrin SR 150 mg qd #30 with no refills and  agreed to follow up as directed in 3 weeks.  Obesity Brixton is currently in the action stage of change. As such, his goal is to continue with weight loss efforts. He has agreed to follow the Category 3 plan. Caspian has been instructed to work up to a goal of 150 minutes of combined cardio and  strengthening exercise per week for weight loss and overall health benefits. We discussed the following Behavioral Modification Strategies today: increasing lean protein intake, decreasing simple carbohydrates, and work on meal planning and easy cooking plans.  Takeo has agreed to follow up with our clinic in 3 weeks. He was informed of the importance of frequent follow up visits to maximize his success with intensive lifestyle modifications for his multiple health conditions.   OBESITY BEHAVIORAL INTERVENTION VISIT  Today's visit was # 22  Starting weight: 326 lbs Starting date: 09/08/17 Today's weight : Weight: (!) 315 lb (142.9 kg)  Today's date: 09/08/2018 Total lbs lost to date: 70  ASK: We discussed the diagnosis of obesity with [name] today and Mackey agreed to give [place] permission to discuss obesity behavioral modification therapy today.  ASSESS: Paulino has the diagnosis of obesity and his BMI today is 45.2. Nolan is in the action stage of change.   ADVISE: Cassian was educated on the multiple health risks of obesity as well as the benefit of weight loss to improve his health. He was advised of the need for long term treatment and the importance of lifestyle modifications to improve his current health and to decrease his risk of future health problems.  AGREE: Multiple dietary modification options and treatment options were discussed and Harlo agreed to follow the recommendations documented in the above note.  ARRANGE: Donne was educated on the importance of frequent visits to treat obesity as outlined per CMS and USPSTF guidelines and agreed to schedule his next follow up appointment today.  I, Marcille Blanco, am acting as transcriptionist for Starlyn Skeans, MD  I have reviewed the above documentation for accuracy and completeness, and I agree with the above. -Dennard Nip, MD

## 2018-09-17 ENCOUNTER — Other Ambulatory Visit (INDEPENDENT_AMBULATORY_CARE_PROVIDER_SITE_OTHER): Payer: Self-pay | Admitting: Family Medicine

## 2018-09-17 DIAGNOSIS — I10 Essential (primary) hypertension: Secondary | ICD-10-CM

## 2018-09-29 ENCOUNTER — Ambulatory Visit (INDEPENDENT_AMBULATORY_CARE_PROVIDER_SITE_OTHER): Payer: BC Managed Care – PPO | Admitting: Family Medicine

## 2018-09-29 ENCOUNTER — Encounter: Payer: Self-pay | Admitting: Internal Medicine

## 2018-09-29 VITALS — BP 131/83 | HR 78 | Temp 97.9°F | Ht 70.0 in | Wt 316.0 lb

## 2018-09-29 DIAGNOSIS — F3289 Other specified depressive episodes: Secondary | ICD-10-CM

## 2018-09-29 DIAGNOSIS — R7303 Prediabetes: Secondary | ICD-10-CM

## 2018-09-29 DIAGNOSIS — Z9189 Other specified personal risk factors, not elsewhere classified: Secondary | ICD-10-CM | POA: Diagnosis not present

## 2018-09-29 DIAGNOSIS — I10 Essential (primary) hypertension: Secondary | ICD-10-CM | POA: Diagnosis not present

## 2018-09-29 DIAGNOSIS — E559 Vitamin D deficiency, unspecified: Secondary | ICD-10-CM

## 2018-09-29 DIAGNOSIS — Z6841 Body Mass Index (BMI) 40.0 and over, adult: Secondary | ICD-10-CM

## 2018-09-29 MED ORDER — VITAMIN D (ERGOCALCIFEROL) 1.25 MG (50000 UNIT) PO CAPS: 50000.0000 [IU] | ORAL_CAPSULE | ORAL | 0 refills | Status: DC

## 2018-09-29 MED ORDER — METFORMIN HCL 500 MG PO TABS: 500.0000 mg | ORAL_TABLET | Freq: Two times a day (BID) | ORAL | 0 refills | Status: DC

## 2018-09-29 MED ORDER — INSULIN PEN NEEDLE 32G X 4 MM MISC: 1.0000 | Freq: Every day | 0 refills | Status: DC

## 2018-09-29 MED ORDER — BUPROPION HCL ER (SR) 150 MG PO TB12: ORAL_TABLET | ORAL | 0 refills | Status: DC

## 2018-09-29 MED ORDER — HYDROCHLOROTHIAZIDE 25 MG PO TABS: ORAL_TABLET | ORAL | 0 refills | Status: DC

## 2018-10-03 NOTE — Progress Notes (Signed)
Office: [phone]  /  Fax: [phone]   HPI:   Chief Complaint: OBESITY Bryan [name] is here to discuss his progress with his obesity treatment plan. He is following the Category 3 plan and is following his eating plan approximately 75-80 % of the time. He states he is using the elliptical 35 minutes 4 times per week. Bryan [name] says that he has increased stress at work and is struggling to meal prep as often. He started to increase exercise and is considering using a Physiological scientist.  His weight is (!) 316 lb (143.3 kg) today and has not lost weight since his last visit. He has lost 11 lbs since starting treatment with [place].  Vitamin D deficiency Bryan [name] has a diagnosis of vitamin D deficiency. He is taking prescription Vit D, he is not yet at goal but he is improving. Bryan [name] denies nausea, vomiting or muscle weakness.  Pre-Diabetes Bryan [name] has a diagnosis of prediabetes based on his elevated HgA1c and was informed this puts him at greater risk of developing diabetes. He is stable taking Metformin and Victoza. Bryan [name] continues to work on diet and exercise to decrease risk of diabetes. He denies nausea, vomiting, muscle weakness or hypoglycemia.  At risk for diabetes Bryan [name] is at higher than averagerisk for developing diabetes due to his obesity and pre- diabetes. He currently denies polyuria or polydipsia.  Hypertension Bryan [name] is a 57 y.o. male with hypertension.  Bryan [name] denies chest pain, HAs or shortness of breath on exertion. He is working weight loss to help control his blood pressure with the goal of decreasing his risk of heart attack and stroke. Bryan [name] blood pressure is stable on HCTZ.   Depression with emotional eating behaviors Bryan [name] is struggling with emotional eating and using food for comfort to the extent that it is negatively impacting his health. He often snacks when he is not hungry. Bryan [name] sometimes feels he is out of control and then feels guilty  that he made poor food choices. He has been working on behavior modification techniques to help reduce his emotional eating and has been somewhat successful. Bryan [name]'s mood is stable on Wellbutrin. He denies insomnia and BP remains stable.  He shows no sign of suicidal or homicidal ideations.  Depression screen Bryan [name] 2/9 09/08/2017 06/23/2017 05/22/2016  Decreased Interest 1 0 0  Down, Depressed, Hopeless 1 0 0  PHQ - 2 Score 2 0 0  Altered sleeping 3 - -  Tired, decreased energy 2 - -  Change in appetite 2 - -  Feeling bad or failure about yourself  3 - -  Trouble concentrating 1 - -  Moving slowly or fidgety/restless 0 - -  Suicidal thoughts 0 - -  PHQ-9 Score 13 - -  Difficult doing work/chores Not difficult at all - -     ALLERGIES: Allergies  Allergen Reactions  . Lisinopril Cough    MEDICATIONS: Current Outpatient Medications on File Prior to Visit  Medication Sig Dispense Refill  . aspirin 81 MG tablet Take 81 mg by mouth daily.    [name] atorvastatin (LIPITOR) 20 MG tablet Take 1 tablet (20 mg total) by mouth at bedtime. 90 tablet 3  . azelastine (ASTELIN) 0.1 % nasal spray Place 2 sprays into both nostrils at bedtime as needed for rhinitis. Use in each nostril as directed    . CO ENZYME Q-10 PO Take by mouth daily.    . fish oil-omega-3 fatty acids 1000 MG capsule Take 1 g by mouth  daily.    . liraglutide (VICTOZA) 18 MG/3ML SOPN Inject 0.2 mLs (1.2 mg total) into the skin every morning. 2 pen 0  . Melatonin 1 MG CAPS Take 1 mg by mouth at bedtime as needed.     . Multiple Vitamins-Minerals (CENTRUM PO) Take 1 tablet by mouth daily.    Cristino Martes Root 450 MG CAPS Take 450 mg by mouth at bedtime as needed.      No current facility-administered medications on file prior to visit.     PAST MEDICAL HISTORY: Past Medical History:  Diagnosis Date  . Dental crowns present   . Hyperlipidemia   . Lower back pain   . Nephrolithiasis 2017  . Prediabetes 09/04/2014  . Seasonal  allergies   . Sebaceous cyst 04/2013   upper back  . Sleep apnea    uses CPAP nightly  . Urolithiasis 10/2016    PAST SURGICAL HISTORY: Past Surgical History:  Procedure Laterality Date  . ADENOIDECTOMY     as a child  . CHOLECYSTECTOMY  01/12/2012   Procedure: LAPAROSCOPIC CHOLECYSTECTOMY WITH INTRAOPERATIVE CHOLANGIOGRAM;  Surgeon: [name], MD;  Location: [facility];  Service: General;  Laterality: N/A;  . CYST EXCISION     cyst removal from back  . EAR CYST EXCISION N/A 05/04/2013   Procedure: Excision subcutaneous mass posterior neck;  Surgeon: Imogene Burn. [name], MD;  Location: [facility];  Service: General;  Laterality: N/A;    SOCIAL HISTORY: Social History   Tobacco Use  . Smoking status: Never Smoker  . Smokeless tobacco: Never Used  Substance Use Topics  . Alcohol use: Yes    Comment: 1 glass wine every other day  . Drug use: No    FAMILY HISTORY: Family History  Problem Relation Age of Onset  . Cancer Mother        ovarian  . Hypertension Mother   . Hyperlipidemia Mother   . Obesity Mother   . Cancer Brother        leukemia  . Diabetes Brother   . Colon cancer Maternal Aunt 72  . Cancer Paternal Grandmother        breast  . Cancer Father 62       renal  . Kidney disease Father   . Sleep apnea Father   . Prostate cancer Neg Hx   . Esophageal cancer Neg Hx   . Rectal cancer Neg Hx   . Stomach cancer Neg Hx   . CAD Neg Hx     ROS: Review of Systems  Constitutional: Negative for weight loss.  Respiratory: Negative for shortness of breath.   Cardiovascular: Negative for chest pain.  Gastrointestinal: Negative for nausea and vomiting.  Musculoskeletal:       Negative muscle weakness  Neurological: Negative for headaches.  Endo/Heme/Allergies: Negative for polydipsia.       Negative hypoglycemia Negative polyuria  Psychiatric/Behavioral: Positive for depression. Negative for suicidal ideas. The patient does not have insomnia.         Negative homicidal ideations    PHYSICAL EXAM: Blood pressure 131/83, pulse 78, temperature 97.9 F (36.6 C), temperature source Oral, height 5\' 10"  (1.778 m), weight (!) 316 lb (143.3 kg), SpO2 95 %. Body mass index is 45.34 kg/m. Physical Exam  Constitutional: He is oriented to person, place, and time. He appears well-developed and well-nourished.  Cardiovascular: Normal rate.  Pulmonary/Chest: Effort normal.  Musculoskeletal: Normal range of motion.  Neurological: He is alert and oriented to person,  place, and time.  Skin: Skin is warm and dry.  Psychiatric: He has a normal mood and affect. His behavior is normal.  Vitals reviewed.   RECENT LABS AND TESTS: BMET    Component Value Date/Time   NA 140 08/18/2018 0830   K 4.1 08/18/2018 0830   CL 103 08/18/2018 0830   CO2 23 08/18/2018 0830   GLUCOSE 100 (H) 08/18/2018 0830   GLUCOSE 104 (H) 06/23/2017 1028   BUN 13 08/18/2018 0830   CREATININE 0.85 08/18/2018 0830   CALCIUM 9.1 08/18/2018 0830   GFRNONAA 98 08/18/2018 0830   GFRAA 113 08/18/2018 0830   Lab Results  Component Value Date   HGBA1C 5.9 (H) 08/18/2018   HGBA1C 5.9 (H) 02/17/2018   HGBA1C 5.9 (H) 01/05/2018   HGBA1C 5.9 (H) 09/08/2017   HGBA1C 5.9 06/23/2017   Lab Results  Component Value Date   INSULIN 24.0 08/18/2018   INSULIN 33.0 (H) 02/17/2018   INSULIN 19.4 01/05/2018   INSULIN 21.9 09/08/2017   CBC    Component Value Date/Time   WBC 6.1 08/18/2018 0830   WBC 5.9 06/23/2017 1028   RBC 4.95 08/18/2018 0830   RBC 5.03 06/23/2017 1028   HGB 14.0 08/18/2018 0830   HCT 42.7 08/18/2018 0830   PLT 253.0 06/23/2017 1028   MCV 86 08/18/2018 0830   MCH 28.3 08/18/2018 0830   MCH 28.8 01/13/2012 0328   MCHC 32.8 08/18/2018 0830   MCHC 32.9 06/23/2017 1028   RDW 13.2 08/18/2018 0830   LYMPHSABS 1.5 08/18/2018 0830   MONOABS 0.4 06/23/2017 1028   EOSABS 0.2 08/18/2018 0830   BASOSABS 0.1 08/18/2018 0830   Iron/TIBC/Ferritin/ %Sat No  results found for: IRON, TIBC, FERRITIN, IRONPCTSAT Lipid Panel     Component Value Date/Time   CHOL 133 08/18/2018 0830   TRIG 177 (H) 08/18/2018 0830   HDL 40 08/18/2018 0830   CHOLHDL 3 06/23/2017 1028   VLDL 26.2 06/23/2017 1028   LDLCALC 58 08/18/2018 0830   LDLDIRECT 136.0 11/16/2016 0902   Hepatic Function Panel     Component Value Date/Time   PROT 6.8 08/18/2018 0830   ALBUMIN 4.1 08/18/2018 0830   AST 18 08/18/2018 0830   ALT 38 08/18/2018 0830   ALKPHOS 50 08/18/2018 0830   BILITOT 0.3 08/18/2018 0830   BILIDIR 0.0 02/26/2012 1353      Component Value Date/Time   TSH 1.810 08/18/2018 0830   TSH 1.400 09/08/2017 1144   TSH 1.31 06/23/2017 1028   Results for Bolser, Daquon B "[place]" (MRN [id number]) as of 10/03/2018 19:33  Ref. Range 08/18/2018 08:30  Vitamin D, 25-Hydroxy Latest Ref Range: 30.0 - 100.0 ng/mL 39.8    ASSESSMENT AND PLAN: Vitamin D deficiency - Plan: Vitamin D, Ergocalciferol, (DRISDOL) 50000 units CAPS capsule  Prediabetes - Plan: Insulin Pen Needle 32G X 4 MM MISC, metFORMIN (GLUCOPHAGE) 500 MG tablet  Essential hypertension - Plan: hydrochlorothiazide (HYDRODIURIL) 25 MG tablet  Other depression - with emotional eating - Plan: buPROPion (WELLBUTRIN SR) 150 MG 12 hr tablet  At risk for diabetes mellitus  Class 3 severe obesity with serious comorbidity and body mass index (BMI) of 45.0 to 49.9 in adult, unspecified obesity type (HCC)  PLAN: Vitamin D Deficiency Bryan [name] was informed that low vitamin D levels contributes to fatigue and are associated with obesity, breast, and colon cancer. He agrees to continue taking prescription Vit D @50 ,000 IU every week #4 with no refills. Bryan [name] will follow up for routine testing  of vitamin D, at least 2-3 times per year. He was informed of the risk of over-replacement of vitamin D and agrees to not increase his dose unless he discusses this with [place] first. Bryan [name] agrees to follow up in our office in 3  weeks.   Pre-Diabetes Bryan [name] will continue to work on weight loss, exercise, and decreasing simple carbohydrates in his diet to help decrease the risk of diabetes. We dicussed metformin including benefits and risks. He was informed that eating too many simple carbohydrates or too many calories at one sitting increases the likelihood of GI side effects. Madex agrees to continue taking Metformin 500 mg qd #30 with no refills and Victoza 1.2 ml subQ daily. Needles sent in today as well.  Bryan [name] agreed to follow up with our office in 3 weeks to monitor his progress.  Diabetes risk counselling Bryan [name] was given extended (15 minutes) diabetes prevention counseling today. He is 57 y.o. male and has risk factors for diabetes including obesity. We discussed intensive lifestyle modifications today with an emphasis on weight loss as well as increasing exercise and decreasing simple carbohydrates in his diet. Bryan [name] agrees to follow up in our office in 3 weeks.   Hypertension We discussed sodium restriction, working on healthy weight loss, and a regular exercise program as the means to achieve improved blood pressure control. Jullien agreed with this plan and agreed to follow up as directed. We will continue to monitor his blood pressure as well as his progress with the above lifestyle modifications. He will continue taking HCTZ 25 mg qd #30 with no refills. Bryan [name] will watch for signs of hypotension as he continues his lifestyle modifications and continues his diet. Bryan [name] agrees to follow up in 3 weeks.   Depression with Emotional Eating Behaviors We discussed behavior modification techniques today to help Bryan [name] deal with his emotional eating and depression. He has agreed to continue taking Wellbutrin SR 150 mg qd #30 with no refills. Finnigan agreed to follow up in our office in 3 weeks.   Obesity Bryan [name] is not currently in the action stage of change. As such, his goal is to continue with weight loss  efforts He has agreed to follow the Category 3 plan Bryan [name] has been instructed to work up to a goal of 150 minutes of combined cardio and strengthening exercise per week for weight loss and overall health benefits. We discussed the following Behavioral Modification Strategies today: increasing lean protein intake, decreasing simple carbohydrates , work on meal planning and easy cooking plans and holiday eating strategies   Savas has agreed to follow up with our clinic in 3 weeks. He was informed of the importance of frequent follow up visits to maximize his success with intensive lifestyle modifications for his multiple health conditions.   OBESITY BEHAVIORAL INTERVENTION VISIT  Today's visit was # 23  Starting weight: 326 lbs Starting date: 09/08/2017 Today's weight : Weight: (!) 316 lb (143.3 kg)  Today's date: 09/29/2018 Total lbs lost to date: 11 lbs   ASK: We discussed the diagnosis of obesity with Bryan [name] today and Eloise agreed to give [place] permission to discuss obesity behavioral modification therapy today.  ASSESS: Rue has the diagnosis of obesity and his BMI today is 45.34 Daisean is not in the action stage of change   ADVISE: Jaliel was educated on the multiple health risks of obesity as well as the benefit of weight loss to improve his health. He was advised of the need for long term  treatment and the importance of lifestyle modifications to improve his current health and to decrease his risk of future health problems.  AGREE: Multiple dietary modification options and treatment options were discussed and  Younis agreed to follow the recommendations documented in the above note.  ARRANGE: Cordai was educated on the importance of frequent visits to treat obesity as outlined per CMS and USPSTF guidelines and agreed to schedule his next follow up appointment today.  I, Remi Deter, CMA, am acting as transcriptionist for Dennard Nip, MD  I have  reviewed the above documentation for accuracy and completeness, and I agree with the above. -Dennard Nip, MD

## 2018-10-04 ENCOUNTER — Encounter (INDEPENDENT_AMBULATORY_CARE_PROVIDER_SITE_OTHER): Payer: Self-pay | Admitting: Family Medicine

## 2018-10-20 ENCOUNTER — Ambulatory Visit (INDEPENDENT_AMBULATORY_CARE_PROVIDER_SITE_OTHER): Payer: BC Managed Care – PPO | Admitting: Family Medicine

## 2018-10-20 VITALS — BP 134/80 | HR 72 | Temp 97.9°F | Ht 70.0 in | Wt 316.0 lb

## 2018-10-20 DIAGNOSIS — Z6841 Body Mass Index (BMI) 40.0 and over, adult: Secondary | ICD-10-CM

## 2018-10-20 DIAGNOSIS — F3289 Other specified depressive episodes: Secondary | ICD-10-CM

## 2018-10-20 DIAGNOSIS — I10 Essential (primary) hypertension: Secondary | ICD-10-CM

## 2018-10-20 DIAGNOSIS — R7303 Prediabetes: Secondary | ICD-10-CM

## 2018-10-20 DIAGNOSIS — E559 Vitamin D deficiency, unspecified: Secondary | ICD-10-CM

## 2018-10-20 MED ORDER — VITAMIN D (ERGOCALCIFEROL) 1.25 MG (50000 UNIT) PO CAPS: 50000.0000 [IU] | ORAL_CAPSULE | ORAL | 0 refills | Status: DC

## 2018-10-20 MED ORDER — BUPROPION HCL ER (SR) 150 MG PO TB12: ORAL_TABLET | ORAL | 0 refills | Status: DC

## 2018-10-20 MED ORDER — METFORMIN HCL 500 MG PO TABS: 500.0000 mg | ORAL_TABLET | Freq: Two times a day (BID) | ORAL | 0 refills | Status: DC

## 2018-10-20 MED ORDER — HYDROCHLOROTHIAZIDE 25 MG PO TABS: ORAL_TABLET | ORAL | 0 refills | Status: DC

## 2018-10-25 NOTE — Progress Notes (Signed)
Office: [phone]  /  Fax: [phone]   HPI:   Chief Complaint: OBESITY Bryan [name] is here to discuss his progress with his obesity treatment plan. He is on the Category 3 plan and is following his eating plan approximately 70 % of the time. He states he is on the elliptical for 45 minutes 3-4 times per week. Falcon has done well with maintaining his weight over Halloween. He is mindful of his food choices and working on meal planning and prepping.  His weight is (!) 316 lb (143.3 kg) today and has not lost weight since his last visit. He has lost 10 lbs since starting treatment with [place].  Hypertension Bryan [name] is a 57 y.o. male with hypertension. [name] blood pressure is controlled on medications. He denies chest pain or headaches. He is working on diet and weight loss to help control his blood pressure with the goal of decreasing his risk of heart attack and stroke.   Pre-Diabetes Bryan [name] has a diagnosis of pre-diabetes based on his elevated Hgb A1c and was informed this puts him at greater risk of developing diabetes. He is stable on metformin and he denies nausea, vomiting, or hypoglycemia. He continues to work on diet and exercise to decrease risk of diabetes.   Vitamin D Deficiency Bryan [name] has a diagnosis of vitamin D deficiency. He is stable on prescription Vit D and denies nausea, vomiting or muscle weakness.  Depression with emotional eating behaviors Bryan [name] is stable on Wellbutrin. He struggles with emotional eating and using food for comfort to the extent that it is negatively impacting his health. He often snacks when he is not hungry. Bryan [name] sometimes feels he is out of control and then feels guilty that he made poor food choices. He has been working on behavior modification techniques to help reduce his emotional eating and has been somewhat successful. He shows no sign of suicidal or homicidal ideations.  Depression screen [facility] 2/9 09/08/2017 06/23/2017 05/22/2016    Decreased Interest 1 0 0  Down, Depressed, Hopeless 1 0 0  PHQ - 2 Score 2 0 0  Altered sleeping 3 - -  Tired, decreased energy 2 - -  Change in appetite 2 - -  Feeling bad or failure about yourself  3 - -  Trouble concentrating 1 - -  Moving slowly or fidgety/restless 0 - -  Suicidal thoughts 0 - -  PHQ-9 Score 13 - -  Difficult doing work/chores Not difficult at all - -    ALLERGIES: Allergies  Allergen Reactions  . Lisinopril Cough    MEDICATIONS: Current Outpatient Medications on File Prior to Visit  Medication Sig Dispense Refill  . aspirin 81 MG tablet Take 81 mg by mouth daily.    [name] atorvastatin (LIPITOR) 20 MG tablet Take 1 tablet (20 mg total) by mouth at bedtime. 90 tablet 3  . azelastine (ASTELIN) 0.1 % nasal spray Place 2 sprays into both nostrils at bedtime as needed for rhinitis. Use in each nostril as directed    . CO ENZYME Q-10 PO Take by mouth daily.    . fish oil-omega-3 fatty acids 1000 MG capsule Take 1 g by mouth daily.    . Insulin Pen Needle 32G X 4 MM MISC 1 each by Does not apply route daily. 50 each 0  . liraglutide (VICTOZA) 18 MG/3ML SOPN Inject 0.2 mLs (1.2 mg total) into the skin every morning. 2 pen 0  . Melatonin 1 MG CAPS Take 1 mg by mouth  at bedtime as needed.     . Multiple Vitamins-Minerals (CENTRUM PO) Take 1 tablet by mouth daily.    Bryan [name] 450 MG CAPS Take 450 mg by mouth at bedtime as needed.      No current facility-administered medications on file prior to visit.     PAST MEDICAL HISTORY: Past Medical History:  Diagnosis Date  . Dental crowns present   . Hyperlipidemia   . Lower back pain   . Nephrolithiasis 2017  . Prediabetes 09/04/2014  . Seasonal allergies   . Sebaceous cyst 04/2013   upper back  . Sleep apnea    uses CPAP nightly  . Urolithiasis 10/2016    PAST SURGICAL HISTORY: Past Surgical History:  Procedure Laterality Date  . ADENOIDECTOMY     as a child  . CHOLECYSTECTOMY  01/12/2012   Procedure:  LAPAROSCOPIC CHOLECYSTECTOMY WITH INTRAOPERATIVE CHOLANGIOGRAM;  Surgeon: [name], MD;  Location: [facility];  Service: General;  Laterality: N/A;  . CYST EXCISION     cyst removal from back  . EAR CYST EXCISION N/A 05/04/2013   Procedure: Excision subcutaneous mass posterior neck;  Surgeon: Imogene Burn. [name], MD;  Location: [facility];  Service: General;  Laterality: N/A;    SOCIAL HISTORY: Social History   Tobacco Use  . Smoking status: Never Smoker  . Smokeless tobacco: Never Used  Substance Use Topics  . Alcohol use: Yes    Comment: 1 glass wine every other day  . Drug use: No    FAMILY HISTORY: Family History  Problem Relation Age of Onset  . Cancer Mother        ovarian  . Hypertension Mother   . Hyperlipidemia Mother   . Obesity Mother   . Cancer Brother        leukemia  . Diabetes Brother   . Colon cancer Maternal Aunt 5  . Cancer Paternal Grandmother        breast  . Cancer Father 16       renal  . Kidney disease Father   . Sleep apnea Father   . Prostate cancer Neg Hx   . Esophageal cancer Neg Hx   . Rectal cancer Neg Hx   . Stomach cancer Neg Hx   . CAD Neg Hx     ROS: Review of Systems  Constitutional: Negative for weight loss.  Cardiovascular: Negative for chest pain.  Gastrointestinal: Negative for nausea and vomiting.  Musculoskeletal:       Negative muscle weakness  Neurological: Negative for headaches.  Endo/Heme/Allergies:       Negative hypoglycemia  Psychiatric/Behavioral: Positive for depression. Negative for suicidal ideas.    PHYSICAL EXAM: Blood pressure 134/80, pulse 72, temperature 97.9 F (36.6 C), temperature source Oral, height 5\' 10"  (1.778 m), weight (!) 316 lb (143.3 kg), SpO2 97 %. Body mass index is 45.34 kg/m. Physical Exam  Constitutional: He is oriented to person, place, and time. He appears well-developed and well-nourished.  Cardiovascular: Normal rate.  Pulmonary/Chest: Effort normal.    Musculoskeletal: Normal range of motion.  Neurological: He is oriented to person, place, and time.  Skin: Skin is warm and dry.  Psychiatric: He has a normal mood and affect. His behavior is normal.  Vitals reviewed.   RECENT LABS AND TESTS: BMET    Component Value Date/Time   NA 140 08/18/2018 0830   K 4.1 08/18/2018 0830   CL 103 08/18/2018 0830   CO2 23 08/18/2018 0830  GLUCOSE 100 (H) 08/18/2018 0830   GLUCOSE 104 (H) 06/23/2017 1028   BUN 13 08/18/2018 0830   CREATININE 0.85 08/18/2018 0830   CALCIUM 9.1 08/18/2018 0830   GFRNONAA 98 08/18/2018 0830   GFRAA 113 08/18/2018 0830   Lab Results  Component Value Date   HGBA1C 5.9 (H) 08/18/2018   HGBA1C 5.9 (H) 02/17/2018   HGBA1C 5.9 (H) 01/05/2018   HGBA1C 5.9 (H) 09/08/2017   HGBA1C 5.9 06/23/2017   Lab Results  Component Value Date   INSULIN 24.0 08/18/2018   INSULIN 33.0 (H) 02/17/2018   INSULIN 19.4 01/05/2018   INSULIN 21.9 09/08/2017   CBC    Component Value Date/Time   WBC 6.1 08/18/2018 0830   WBC 5.9 06/23/2017 1028   RBC 4.95 08/18/2018 0830   RBC 5.03 06/23/2017 1028   HGB 14.0 08/18/2018 0830   HCT 42.7 08/18/2018 0830   PLT 253.0 06/23/2017 1028   MCV 86 08/18/2018 0830   MCH 28.3 08/18/2018 0830   MCH 28.8 01/13/2012 0328   MCHC 32.8 08/18/2018 0830   MCHC 32.9 06/23/2017 1028   RDW 13.2 08/18/2018 0830   LYMPHSABS 1.5 08/18/2018 0830   MONOABS 0.4 06/23/2017 1028   EOSABS 0.2 08/18/2018 0830   BASOSABS 0.1 08/18/2018 0830   Iron/TIBC/Ferritin/ %Sat No results found for: IRON, TIBC, FERRITIN, IRONPCTSAT Lipid Panel     Component Value Date/Time   CHOL 133 08/18/2018 0830   TRIG 177 (H) 08/18/2018 0830   HDL 40 08/18/2018 0830   CHOLHDL 3 06/23/2017 1028   VLDL 26.2 06/23/2017 1028   LDLCALC 58 08/18/2018 0830   LDLDIRECT 136.0 11/16/2016 0902   Hepatic Function Panel     Component Value Date/Time   PROT 6.8 08/18/2018 0830   ALBUMIN 4.1 08/18/2018 0830   AST 18 08/18/2018  0830   ALT 38 08/18/2018 0830   ALKPHOS 50 08/18/2018 0830   BILITOT 0.3 08/18/2018 0830   BILIDIR 0.0 02/26/2012 1353      Component Value Date/Time   TSH 1.810 08/18/2018 0830   TSH 1.400 09/08/2017 1144   TSH 1.31 06/23/2017 1028  Results for Highbaugh, Shady B "Lakin" (MRN [id number]) as of 10/25/2018 14:22  Ref. Range 08/18/2018 08:30  Vitamin D, 25-Hydroxy Latest Ref Range: 30.0 - 100.0 ng/mL 39.8    ASSESSMENT AND PLAN: Essential hypertension - Plan: hydrochlorothiazide (HYDRODIURIL) 25 MG tablet  Prediabetes - Plan: metFORMIN (GLUCOPHAGE) 500 MG tablet  Vitamin D deficiency - Plan: Vitamin D, Ergocalciferol, (DRISDOL) 1.25 MG (50000 UT) CAPS capsule  Other depression - with emotional eating  - Plan: buPROPion (WELLBUTRIN SR) 150 MG 12 hr tablet  Class 3 severe obesity with serious comorbidity and body mass index (BMI) of 45.0 to 49.9 in adult, unspecified obesity type ([place])  PLAN:  Hypertension We discussed sodium restriction, working on healthy weight loss, and a regular exercise program as the means to achieve improved blood pressure control. Reznor agreed with this plan and agreed to follow up as directed. We will continue to monitor his blood pressure as well as his progress with the above lifestyle modifications. Eleuterio agrees to continue taking hydrochlorothiazide 25 mg qd #30 and we will refill for 1 month. He will watch for signs of hypotension as he continues his lifestyle modifications. We will recheck labs at next visit. Khaden agrees to follow up with our clinic in 3 to 4 weeks.  Pre-Diabetes Quinzell will continue to work on weight loss, exercise, and decreasing simple carbohydrates in his diet  to help decrease the risk of diabetes. We dicussed metformin including benefits and risks. He was informed that eating too many simple carbohydrates or too many calories at one sitting increases the likelihood of GI side effects. Gussie agrees to continue taking metformin  500 mg BID #60 and we will refill for 1 month. We will recheck labs at next visit. Danzig agrees to follow up with our clinic in 3 to 4 weeks as directed to monitor his progress.  Vitamin D Deficiency Maliik was informed that low vitamin D levels contributes to fatigue and are associated with obesity, breast, and colon cancer. Aldean agrees to continue taking prescription Vit D @50 ,000 IU every week #4 and we will refill for 1 month. He will follow up for routine testing of vitamin D, at least 2-3 times per year. He was informed of the risk of over-replacement of vitamin D and agrees to not increase his dose unless he discusses this with [place] first. We will recheck labs at next visit. Evann agrees to follow up with our clinic in 3 to 4 weeks.  Depression with Emotional Eating Behaviors We discussed behavior modification techniques today to help Kylil deal with his emotional eating and depression. Lannie agrees to continue taking Wellbutrin SR 150 mg qd #30 and we will refill for 1 month. Pratyush agrees to follow up with our clinic in 3 to 4 weeks.  Obesity Oakes is currently in the action stage of change. As such, his goal is to maintain weight for now He has agreed to keep a food journal with 400-600 calories and 45 grams of protein at supper daily and follow the Category 3 plan Daymond has been instructed to work up to a goal of 150 minutes of combined cardio and strengthening exercise per week for weight loss and overall health benefits. We discussed the following Behavioral Modification Strategies today: increasing lean protein intake and holiday eating strategies  Trysten's goal is to maintain weight over the holidays.  Dajohn has agreed to follow up with our clinic in 3 to 4 weeks. He was informed of the importance of frequent follow up visits to maximize his success with intensive lifestyle modifications for his multiple health conditions.   OBESITY BEHAVIORAL INTERVENTION  VISIT  Today's visit was # 24  Starting weight: 326 lbs Starting date: 09/08/17 Today's weight : 316 lbs Today's date: 10/20/2018 Total lbs lost to date: 10    ASK: We discussed the diagnosis of obesity with [name] today and Covey agreed to give [place] permission to discuss obesity behavioral modification therapy today.  ASSESS: Aneudy has the diagnosis of obesity and his BMI today is 45.34 Courtney is in the action stage of change   ADVISE: Urbano was educated on the multiple health risks of obesity as well as the benefit of weight loss to improve his health. He was advised of the need for long term treatment and the importance of lifestyle modifications to improve his current health and to decrease his risk of future health problems.  AGREE: Multiple dietary modification options and treatment options were discussed and  Amedee agreed to follow the recommendations documented in the above note.  ARRANGE: Kurt was educated on the importance of frequent visits to treat obesity as outlined per CMS and USPSTF guidelines and agreed to schedule his next follow up appointment today.  I, Trixie Dredge, am acting as transcriptionist for Dennard Nip, MD  I have reviewed the above documentation for accuracy and completeness, and I agree  with the above. -Dennard Nip, MD

## 2018-10-26 ENCOUNTER — Encounter (INDEPENDENT_AMBULATORY_CARE_PROVIDER_SITE_OTHER): Payer: Self-pay | Admitting: Family Medicine

## 2018-11-17 ENCOUNTER — Ambulatory Visit (INDEPENDENT_AMBULATORY_CARE_PROVIDER_SITE_OTHER): Payer: BC Managed Care – PPO | Admitting: Family Medicine

## 2018-11-17 ENCOUNTER — Encounter (INDEPENDENT_AMBULATORY_CARE_PROVIDER_SITE_OTHER): Payer: Self-pay | Admitting: Family Medicine

## 2018-11-17 VITALS — BP 128/78 | HR 79 | Temp 97.9°F | Ht 70.0 in | Wt 314.0 lb

## 2018-11-17 DIAGNOSIS — Z9189 Other specified personal risk factors, not elsewhere classified: Secondary | ICD-10-CM

## 2018-11-17 DIAGNOSIS — F3289 Other specified depressive episodes: Secondary | ICD-10-CM | POA: Diagnosis not present

## 2018-11-17 DIAGNOSIS — Z6841 Body Mass Index (BMI) 40.0 and over, adult: Secondary | ICD-10-CM

## 2018-11-17 DIAGNOSIS — I10 Essential (primary) hypertension: Secondary | ICD-10-CM | POA: Diagnosis not present

## 2018-11-17 DIAGNOSIS — R7303 Prediabetes: Secondary | ICD-10-CM | POA: Diagnosis not present

## 2018-11-17 DIAGNOSIS — E559 Vitamin D deficiency, unspecified: Secondary | ICD-10-CM | POA: Diagnosis not present

## 2018-11-17 MED ORDER — HYDROCHLOROTHIAZIDE 25 MG PO TABS: ORAL_TABLET | ORAL | 0 refills | Status: DC

## 2018-11-17 MED ORDER — METFORMIN HCL 500 MG PO TABS: 500.0000 mg | ORAL_TABLET | Freq: Two times a day (BID) | ORAL | 0 refills | Status: DC

## 2018-11-17 MED ORDER — VITAMIN D (ERGOCALCIFEROL) 1.25 MG (50000 UNIT) PO CAPS: 50000.0000 [IU] | ORAL_CAPSULE | ORAL | 0 refills | Status: DC

## 2018-11-17 MED ORDER — LIRAGLUTIDE 18 MG/3ML ~~LOC~~ SOPN: 1.2000 mg | PEN_INJECTOR | Freq: Every morning | SUBCUTANEOUS | 0 refills | Status: DC

## 2018-11-17 MED ORDER — BUPROPION HCL ER (SR) 150 MG PO TB12: ORAL_TABLET | ORAL | 0 refills | Status: DC

## 2018-11-17 NOTE — Progress Notes (Signed)
Office: [phone]  /  Fax: [phone]   HPI:   Chief Complaint: OBESITY Bryan [name] is here to discuss his progress with his obesity treatment plan. He is on the Category 3 plan and is following his eating plan approximately 75 to 80 % of the time. He states he is doing cardio and elliptical 45 minutes 4 times per week. Bryan [name] continues to do well with weight loss on his Category 3 plan. His hunger is controlled and he is doing well with portion control and smarter choices.  His weight is (!) 314 lb (142.4 kg) today and has had a weight loss of 2 pounds over a period of 4 weeks since his last visit. He has lost 12 lbs since starting treatment with [place].  Vitamin D deficiency Bryan [name] has a diagnosis of vitamin D deficiency. He is currently taking vit D and is stable, but not yet at goal. He denies nausea, vomiting, or muscle weakness.  Pre-Diabetes Bryan [name] has a diagnosis of pre-diabetes based on his elevated Hgb A1c and was informed this puts him at greater risk of developing diabetes. He is stable on Victoza and metformin and he is doing well with his diet. He continues to work on diet and exercise to decrease risk of diabetes.   At risk for diabetes Bryan [name] is at higher than average risk for developing diabetes due to his pre-diabetes and obesity. He currently denies polyuria or polydipsia.  Hypertension Bryan [name] is a 57 y.o. male with hypertension. Rajiv's blood pressure is stable on medications. He is working weight loss to help control his blood pressure with the goal of decreasing his risk of heart attack and stroke. Tauren denies chest pain or headaches.  Depression with emotional eating behaviors Bryan [name] is struggling with emotional eating and using food for comfort to the extent that it is negatively impacting his health. He often snacks when he is not hungry. Bryan [name] sometimes feels he is out of control and then feels guilty that he made poor food choices. He has been  working on behavior modification techniques to help reduce his emotional eating and has been somewhat successful. His mood and his blood pressure are stable. He denies insomnia.  ALLERGIES: Allergies  Allergen Reactions  . Lisinopril Cough    MEDICATIONS: Current Outpatient Medications on File Prior to Visit  Medication Sig Dispense Refill  . aspirin 81 MG tablet Take 81 mg by mouth daily.    [name] atorvastatin (LIPITOR) 20 MG tablet Take 1 tablet (20 mg total) by mouth at bedtime. 90 tablet 3  . azelastine (ASTELIN) 0.1 % nasal spray Place 2 sprays into both nostrils at bedtime as needed for rhinitis. Use in each nostril as directed    . CO ENZYME Q-10 PO Take by mouth daily.    . fish oil-omega-3 fatty acids 1000 MG capsule Take 1 g by mouth daily.    . Insulin Pen Needle 32G X 4 MM MISC 1 each by Does not apply route daily. 50 each 0  . Melatonin 1 MG CAPS Take 1 mg by mouth at bedtime as needed.     . Multiple Vitamins-Minerals (CENTRUM PO) Take 1 tablet by mouth daily.    Bryan [name] Martes Root 450 MG CAPS Take 450 mg by mouth at bedtime as needed.      No current facility-administered medications on file prior to visit.     PAST MEDICAL HISTORY: Past Medical History:  Diagnosis Date  . Dental crowns present   . Hyperlipidemia   .  Lower back pain   . Nephrolithiasis 2017  . Prediabetes 09/04/2014  . Seasonal allergies   . Sebaceous cyst 04/2013   upper back  . Sleep apnea    uses CPAP nightly  . Urolithiasis 10/2016    PAST SURGICAL HISTORY: Past Surgical History:  Procedure Laterality Date  . ADENOIDECTOMY     as a child  . CHOLECYSTECTOMY  01/12/2012   Procedure: LAPAROSCOPIC CHOLECYSTECTOMY WITH INTRAOPERATIVE CHOLANGIOGRAM;  Surgeon: [name], MD;  Location: [facility];  Service: General;  Laterality: N/A;  . CYST EXCISION     cyst removal from back  . EAR CYST EXCISION N/A 05/04/2013   Procedure: Excision subcutaneous mass posterior neck;  Surgeon: Imogene Burn. [name],  MD;  Location: [facility];  Service: General;  Laterality: N/A;    SOCIAL HISTORY: Social History   Tobacco Use  . Smoking status: Never Smoker  . Smokeless tobacco: Never Used  Substance Use Topics  . Alcohol use: Yes    Comment: 1 glass wine every other day  . Drug use: No    FAMILY HISTORY: Family History  Problem Relation Age of Onset  . Cancer Mother        ovarian  . Hypertension Mother   . Hyperlipidemia Mother   . Obesity Mother   . Cancer Brother        leukemia  . Diabetes Brother   . Colon cancer Maternal Aunt 44  . Cancer Paternal Grandmother        breast  . Cancer Father 38       renal  . Kidney disease Father   . Sleep apnea Father   . Prostate cancer Neg Hx   . Esophageal cancer Neg Hx   . Rectal cancer Neg Hx   . Stomach cancer Neg Hx   . CAD Neg Hx     ROS: Review of Systems  Constitutional: Positive for weight loss.  Cardiovascular: Negative for chest pain.  Gastrointestinal: Negative for nausea and vomiting.  Genitourinary:       Negative for polyuria.  Musculoskeletal:       Negative for muscle weakness.  Neurological: Negative for headaches.  Endo/Heme/Allergies: Negative for polydipsia.  Psychiatric/Behavioral: Positive for depression. The patient does not have insomnia.     PHYSICAL EXAM: Blood pressure 128/78, pulse 79, temperature 97.9 F (36.6 C), temperature source Oral, height 5\' 10"  (1.778 m), weight (!) 314 lb (142.4 kg), SpO2 96 %. Body mass index is 45.05 kg/m. Physical Exam Vitals signs reviewed.  Constitutional:      Appearance: Normal appearance. He is obese.  Cardiovascular:     Rate and Rhythm: Normal rate.  Pulmonary:     Effort: Pulmonary effort is normal.  Musculoskeletal: Normal range of motion.  Skin:    General: Skin is warm and dry.  Neurological:     Mental Status: He is alert and oriented to person, place, and time.  Psychiatric:        Mood and Affect: Mood normal.        Behavior:  Behavior normal.    RECENT LABS AND TESTS: BMET    Component Value Date/Time   NA 140 08/18/2018 0830   K 4.1 08/18/2018 0830   CL 103 08/18/2018 0830   CO2 23 08/18/2018 0830   GLUCOSE 100 (H) 08/18/2018 0830   GLUCOSE 104 (H) 06/23/2017 1028   BUN 13 08/18/2018 0830   CREATININE 0.85 08/18/2018 0830   CALCIUM 9.1 08/18/2018 0830  GFRNONAA 98 08/18/2018 0830   GFRAA 113 08/18/2018 0830   Lab Results  Component Value Date   HGBA1C 5.9 (H) 08/18/2018   HGBA1C 5.9 (H) 02/17/2018   HGBA1C 5.9 (H) 01/05/2018   HGBA1C 5.9 (H) 09/08/2017   HGBA1C 5.9 06/23/2017   Lab Results  Component Value Date   INSULIN 24.0 08/18/2018   INSULIN 33.0 (H) 02/17/2018   INSULIN 19.4 01/05/2018   INSULIN 21.9 09/08/2017   CBC    Component Value Date/Time   WBC 6.1 08/18/2018 0830   WBC 5.9 06/23/2017 1028   RBC 4.95 08/18/2018 0830   RBC 5.03 06/23/2017 1028   HGB 14.0 08/18/2018 0830   HCT 42.7 08/18/2018 0830   PLT 253.0 06/23/2017 1028   MCV 86 08/18/2018 0830   MCH 28.3 08/18/2018 0830   MCH 28.8 01/13/2012 0328   MCHC 32.8 08/18/2018 0830   MCHC 32.9 06/23/2017 1028   RDW 13.2 08/18/2018 0830   LYMPHSABS 1.5 08/18/2018 0830   MONOABS 0.4 06/23/2017 1028   EOSABS 0.2 08/18/2018 0830   BASOSABS 0.1 08/18/2018 0830   Iron/TIBC/Ferritin/ %Sat No results found for: IRON, TIBC, FERRITIN, IRONPCTSAT Lipid Panel     Component Value Date/Time   CHOL 133 08/18/2018 0830   TRIG 177 (H) 08/18/2018 0830   HDL 40 08/18/2018 0830   CHOLHDL 3 06/23/2017 1028   VLDL 26.2 06/23/2017 1028   LDLCALC 58 08/18/2018 0830   LDLDIRECT 136.0 11/16/2016 0902   Hepatic Function Panel     Component Value Date/Time   PROT 6.8 08/18/2018 0830   ALBUMIN 4.1 08/18/2018 0830   AST 18 08/18/2018 0830   ALT 38 08/18/2018 0830   ALKPHOS 50 08/18/2018 0830   BILITOT 0.3 08/18/2018 0830   BILIDIR 0.0 02/26/2012 1353      Component Value Date/Time   TSH 1.810 08/18/2018 0830   TSH 1.400  09/08/2017 1144   TSH 1.31 06/23/2017 1028   Results for Fullbright, Lan B "[place]" (MRN [id number]) as of 11/17/2018 12:46  Ref. Range 08/18/2018 08:30  Vitamin D, 25-Hydroxy Latest Ref Range: 30.0 - 100.0 ng/mL 39.8   ASSESSMENT AND PLAN: Vitamin D deficiency - Plan: VITAMIN D 25 Hydroxy (Vit-D Deficiency, Fractures), Vitamin D, Ergocalciferol, (DRISDOL) 1.25 MG (50000 UT) CAPS capsule  Prediabetes - Plan: Comprehensive metabolic panel, Hemoglobin A1c, Insulin, random, Lipid Panel With LDL/HDL Ratio, liraglutide (VICTOZA) 18 MG/3ML SOPN, metFORMIN (GLUCOPHAGE) 500 MG tablet  Essential hypertension - Plan: Lipid Panel With LDL/HDL Ratio, hydrochlorothiazide (HYDRODIURIL) 25 MG tablet  Other depression - with emotional eating - Plan: buPROPion (WELLBUTRIN SR) 150 MG 12 hr tablet  At risk for diabetes mellitus  Class 3 severe obesity with serious comorbidity and body mass index (BMI) of 45.0 to 49.9 in adult, unspecified obesity type (HCC)  PLAN:  Vitamin D Deficiency Estevon was informed that low vitamin D levels contributes to fatigue and are associated with obesity, breast, and colon cancer. He agrees to continue to take prescription Vit D @50 ,000 IU every week #4 with no refills and will follow up for routine testing of vitamin D, at least 2-3 times per year. He was informed of the risk of over-replacement of vitamin D and agrees to not increase his dose unless he discusses this with [place] first. We will check labs today. Sage agrees to follow up in 3 to 4 weeks.  Pre-Diabetes Bryan [name] will continue to work on weight loss, exercise, and decreasing simple carbohydrates in his diet to help decrease the risk of  diabetes. He was informed that eating too many simple carbohydrates or too many calories at one sitting increases the likelihood of GI side effects. Archer agreed to continue taking Victoza 1.2mg  qAM #2 pens with no refills and metformin 500mg  BID # 60 with no refills and a prescription  was written today. Bryan [name] agreed to follow up with [place] as directed to monitor his progress.  Diabetes risk counseling Bryan [name] was given extended (15 minutes) diabetes prevention counseling today. He is 57 y.o. male and has risk factors for diabetes including pre-diabetes and obesity. We discussed intensive lifestyle modifications today with an emphasis on weight loss as well as increasing exercise and decreasing simple carbohydrates in his diet.  Hypertension We discussed sodium restriction, working on healthy weight loss, and a regular exercise program as the means to achieve improved blood pressure control. We will continue to monitor his blood pressure as well as his progress with the above lifestyle modifications. He will continue his HCTZ 25mg  qd #30 with no refills and will watch for signs of hypotension as he continues his lifestyle modifications. Bryan [name] agreed with this plan and agreed to follow up as directed  Depression with Emotional Eating Behaviors We discussed behavior modification techniques today to help Bryan [name] deal with his emotional eating and depression. He has agreed to continue to take Wellbutrin SR 150mg  qd and agreed to follow up as directed.  Obesity Bryan [name] is currently in the action stage of change. As such, his goal is to continue with weight loss efforts. He has agreed to follow the Category 3 plan. Bryan [name] has been instructed to work up to a goal of 150 minutes of combined cardio and strengthening exercise per week for weight loss and overall health benefits. We discussed the following Behavioral Modification Strategies today: decreasing simple carbohydrates, work on meal planning and easy cooking plans, holiday eating strategies, and travel eating strategies.  Bryan [name] has agreed to follow up with our clinic in 3 to 4 weeks. He was informed of the importance of frequent follow up visits to maximize his success with intensive lifestyle modifications for his multiple  health conditions.   OBESITY BEHAVIORAL INTERVENTION VISIT  Today's visit was # 25  Starting weight: 326 lbs Starting date: 09/08/17 Today's weight : Weight: (!) 314 lb (142.4 kg)  Today's date: 11/17/2018 Total lbs lost to date: 12  ASK: We discussed the diagnosis of obesity with Bryan [name] today and Bryan [name] agreed to give [place] permission to discuss obesity behavioral modification therapy today.  ASSESS: Bryan [name] has the diagnosis of obesity and his BMI today is 45.0. Bryan [name] is in the action stage of change.   ADVISE: Court was educated on the multiple health risks of obesity as well as the benefit of weight loss to improve his health. He was advised of the need for long term treatment and the importance of lifestyle modifications to improve his current health and to decrease his risk of future health problems.  AGREE: Multiple dietary modification options and treatment options were discussed and Bryan [name] agreed to follow the recommendations documented in the above note.  ARRANGE: Issaac was educated on the importance of frequent visits to treat obesity as outlined per CMS and USPSTF guidelines and agreed to schedule his next follow up appointment today.  I, Marcille Blanco, am acting as transcriptionist for Starlyn Skeans, MD  I have reviewed the above documentation for accuracy and completeness, and I agree with the above. -Dennard Nip, MD

## 2018-11-18 LAB — COMPREHENSIVE METABOLIC PANEL
A/G RATIO: 1.8 (ref 1.2–2.2)
ALBUMIN: 4.6 g/dL (ref 3.5–5.5)
ALT: 26 IU/L (ref 0–44)
AST: 12 IU/L (ref 0–40)
Alkaline Phosphatase: 54 IU/L (ref 39–117)
BILIRUBIN TOTAL: 0.4 mg/dL (ref 0.0–1.2)
BUN / CREAT RATIO: 15 (ref 9–20)
BUN: 14 mg/dL (ref 6–24)
CALCIUM: 9.8 mg/dL (ref 8.7–10.2)
CO2: 25 mmol/L (ref 20–29)
Chloride: 102 mmol/L (ref 96–106)
Creatinine, Ser: 0.92 mg/dL (ref 0.76–1.27)
GFR, EST AFRICAN AMERICAN: 108 mL/min/{1.73_m2} (ref >59)
GFR, EST NON AFRICAN AMERICAN: 93 mL/min/{1.73_m2} (ref >59)
Globulin, Total: 2.6 g/dL (ref 1.5–4.5)
Glucose: 99 mg/dL (ref 65–99)
POTASSIUM: 3.8 mmol/L (ref 3.5–5.2)
Sodium: 144 mmol/L (ref 134–144)
TOTAL PROTEIN: 7.2 g/dL (ref 6.0–8.5)

## 2018-11-18 LAB — INSULIN, RANDOM: INSULIN: 21.9 u[IU]/mL (ref 2.6–24.9)

## 2018-11-18 LAB — LIPID PANEL WITH LDL/HDL RATIO
CHOLESTEROL TOTAL: 144 mg/dL (ref 100–199)
HDL: 43 mg/dL (ref >39)
LDL CALC: 76 mg/dL (ref 0–99)
LDl/HDL Ratio: 1.8 ratio (ref 0.0–3.6)
Triglycerides: 126 mg/dL (ref 0–149)
VLDL CHOLESTEROL CAL: 25 mg/dL (ref 5–40)

## 2018-11-18 LAB — HEMOGLOBIN A1C
Est. average glucose Bld gHb Est-mCnc: 123 mg/dL
Hgb A1c MFr Bld: 5.9 % — ABNORMAL HIGH (ref 4.8–5.6)

## 2018-11-18 LAB — SPECIMEN STATUS REPORT

## 2018-11-18 LAB — VITAMIN D 25 HYDROXY (VIT D DEFICIENCY, FRACTURES): Vit D, 25-Hydroxy: 49.3 ng/mL (ref 30.0–100.0)

## 2018-12-15 ENCOUNTER — Ambulatory Visit (INDEPENDENT_AMBULATORY_CARE_PROVIDER_SITE_OTHER): Payer: BC Managed Care – PPO | Admitting: Family Medicine

## 2018-12-19 ENCOUNTER — Encounter (INDEPENDENT_AMBULATORY_CARE_PROVIDER_SITE_OTHER): Payer: Self-pay | Admitting: Family Medicine

## 2018-12-19 ENCOUNTER — Ambulatory Visit (INDEPENDENT_AMBULATORY_CARE_PROVIDER_SITE_OTHER): Payer: BC Managed Care – PPO | Admitting: Family Medicine

## 2018-12-19 VITALS — BP 124/77 | HR 83 | Temp 98.3°F | Ht 70.0 in | Wt 319.0 lb

## 2018-12-19 DIAGNOSIS — I10 Essential (primary) hypertension: Secondary | ICD-10-CM

## 2018-12-19 DIAGNOSIS — E559 Vitamin D deficiency, unspecified: Secondary | ICD-10-CM

## 2018-12-19 DIAGNOSIS — E66813 Obesity, class 3: Secondary | ICD-10-CM

## 2018-12-19 DIAGNOSIS — Z6841 Body Mass Index (BMI) 40.0 and over, adult: Secondary | ICD-10-CM

## 2018-12-19 DIAGNOSIS — F3289 Other specified depressive episodes: Secondary | ICD-10-CM | POA: Diagnosis not present

## 2018-12-19 DIAGNOSIS — R7303 Prediabetes: Secondary | ICD-10-CM | POA: Diagnosis not present

## 2018-12-19 DIAGNOSIS — Z9189 Other specified personal risk factors, not elsewhere classified: Secondary | ICD-10-CM

## 2018-12-19 MED ORDER — BUPROPION HCL ER (SR) 150 MG PO TB12: ORAL_TABLET | ORAL | 0 refills | Status: DC

## 2018-12-19 MED ORDER — METFORMIN HCL 500 MG PO TABS: 500.0000 mg | ORAL_TABLET | Freq: Two times a day (BID) | ORAL | 0 refills | Status: DC

## 2018-12-19 MED ORDER — VITAMIN D (ERGOCALCIFEROL) 1.25 MG (50000 UNIT) PO CAPS: 50000.0000 [IU] | ORAL_CAPSULE | ORAL | 0 refills | Status: DC

## 2018-12-19 MED ORDER — HYDROCHLOROTHIAZIDE 25 MG PO TABS: ORAL_TABLET | ORAL | 0 refills | Status: DC

## 2018-12-20 NOTE — Progress Notes (Signed)
Office: [phone]  /  Fax: [phone]   HPI:   Chief Complaint: OBESITY Bryan [name] is here to discuss his progress with his obesity treatment plan. He is on the Category 3 plan and is following his eating plan approximately 70 % of the time. He states he is walking and using the elliptical 45 minutes 3 times per week. Bryan [name] is retaining fluid today. He has done well minimizing holiday weight gain. He struggles with eating out at lunch for work meetings and needs another lunch option.  His weight is (!) 319 lb (144.7 kg) today and has had a weight gain of 5 pounds over a period of 5 weeks since his last visit. He has lost 7 lbs since starting treatment with Bryan [name].  Depression with emotional eating behaviors Bryan [name]'s mood is stable and his blood pressure is controlled. He has done better avoiding emotional eating. He has been struggling with emotional eating and using food for comfort to the extent that it is negatively impacting his health. He often snacks when he is not hungry. Bryan [name] sometimes feels he is out of control and then feels guilty that he made poor food choices. He has been working on behavior modification techniques to help reduce his emotional eating and has been somewhat successful. He denies insomnia.  Hypertension Bryan [name] is a 58 y.o. male with hypertension. Bryan [name]'s blood pressure is currently stable. He is working on diet, exercise, and weight loss to help control his blood pressure with the goal of decreasing his risk of heart attack and stroke. Bryan [name] denies chest pain or lightheadedness.  At risk for cardiovascular disease Bryan [name] is at a higher than average risk for cardiovascular disease due to hypertension and obesity. He currently denies any chest pain.  Vitamin D deficiency Bryan [name] has a diagnosis of vitamin D deficiency. He is currently stable on vit D. He is slowly improving and almost at goal. He denies nausea, vomiting, or muscle  weakness.  Pre-Diabetes Bryan [name] has a diagnosis of pre-diabetes based on his elevated Hgb A1c and was informed this puts him at greater risk of developing diabetes. He is stable on metformin and his diet. His fasting glucose is now under 100. He continues to work on diet and exercise to decrease risk of diabetes.   ASSESSMENT AND PLAN:  Essential hypertension - Plan: hydrochlorothiazide (HYDRODIURIL) 25 MG tablet  Prediabetes - Plan: metFORMIN (GLUCOPHAGE) 500 MG tablet  Vitamin D deficiency - Plan: Vitamin D, Ergocalciferol, (DRISDOL) 1.25 MG (50000 UT) CAPS capsule  Other depression - with emotional eating - Plan: buPROPion (WELLBUTRIN SR) 150 MG 12 hr tablet  At risk for heart disease  Class 3 severe obesity with serious comorbidity and body mass index (BMI) of 45.0 to 49.9 in adult, unspecified obesity type (Bryan [name])  PLAN:  Hypertension We discussed sodium restriction, working on healthy weight loss, and a regular exercise program as the means to achieve improved blood pressure control. We will continue to monitor his blood pressure as well as his progress with the above lifestyle modifications. He will continue his medications as prescribed and will watch for signs of hypotension as he continues his lifestyle modifications. Bryan [name] agreed to continue HCTZ 25mg  qd # 30 with no refills and he agreed to follow up as directed in 2 weeks.  Cardiovascular risk counseling Bryan [name] was given extended (15 minutes) coronary artery disease prevention counseling today. He is 58 y.o. male and has risk factors for heart disease including hypertension and obesity. We discussed intensive lifestyle  modifications today with an emphasis on specific weight loss instructions and strategies. Pt was also informed of the importance of increasing exercise and decreasing saturated fats to help prevent heart disease.   Vitamin D Deficiency Bryan [name] was informed that low vitamin D levels contributes to fatigue and  are associated with obesity, breast, and colon cancer. He agrees to continue to take prescription Vit D @50 ,000 IU every week #4 with no refills and will follow up for routine testing of vitamin D, at least 2-3 times per year. He was informed of the risk of over-replacement of vitamin D and agrees to not increase his dose unless he discusses this with Bryan [name] first. Bryan [name] agrees to follow up as directed.  Pre-Diabetes Bryan [name] will continue to work on weight loss, exercise, and decreasing simple carbohydrates in his diet to help decrease the risk of diabetes. He was informed that eating too many simple carbohydrates or too many calories at one sitting increases the likelihood of GI side effects. Bryan [name] agreed to continue his diet and metformin 500mg  BID #60 with no refills and a prescription was written today. Bryan [name] agreed to follow up with Bryan [name] as directed to monitor his progress.  Depression with Emotional Eating Behaviors We discussed behavior modification techniques today to help Bryan [name] deal with his emotional eating and depression. He has agreed to take Wellbutrin SR 150mg  qd #30 with no refills and agreed to follow up as directed.  Obesity Bryan [name] is currently in the action stage of change. As such, his goal is to continue with weight loss efforts. He has agreed to keep a food journal with 350 to 500 calories and 40+ grams of protein for lunch and follow the Category 3 plan. Bryan [name] has been instructed to work up to a goal of 150 minutes of combined cardio and strengthening exercise per week for weight loss and overall health benefits. We discussed the following Behavioral Modification Strategies today: increasing lean protein intake, decreasing simple carbohydrates, better snacking choices, and keep a strict food journal.   Bryan [name] has agreed to follow up with our clinic in 2 weeks. He was informed of the importance of frequent follow up visits to maximize his success with intensive lifestyle  modifications for his multiple health conditions.  ALLERGIES: Allergies  Allergen Reactions  . Lisinopril Cough    MEDICATIONS: Current Outpatient Medications on File Prior to Visit  Medication Sig Dispense Refill  . aspirin 81 MG tablet Take 81 mg by mouth daily.    [name] atorvastatin (LIPITOR) 20 MG tablet Take 1 tablet (20 mg total) by mouth at bedtime. 90 tablet 3  . azelastine (ASTELIN) 0.1 % nasal spray Place 2 sprays into both nostrils at bedtime as needed for rhinitis. Use in each nostril as directed    . CO ENZYME Q-10 PO Take by mouth daily.    . fish oil-omega-3 fatty acids 1000 MG capsule Take 1 g by mouth daily.    . Insulin Pen Needle 32G X 4 MM MISC 1 each by Does not apply route daily. 50 each 0  . liraglutide (VICTOZA) 18 MG/3ML SOPN Inject 0.2 mLs (1.2 mg total) into the skin every morning. 2 pen 0  . Melatonin 1 MG CAPS Take 1 mg by mouth at bedtime as needed.     . Multiple Vitamins-Minerals (CENTRUM PO) Take 1 tablet by mouth daily.    Cristino Martes Root 450 MG CAPS Take 450 mg by mouth at bedtime as needed.      No current facility-administered  medications on file prior to visit.     PAST MEDICAL HISTORY: Past Medical History:  Diagnosis Date  . Dental crowns present   . Hyperlipidemia   . Lower back pain   . Nephrolithiasis 2017  . Prediabetes 09/04/2014  . Seasonal allergies   . Sebaceous cyst 04/2013   upper back  . Sleep apnea    uses CPAP nightly  . Urolithiasis 10/2016    PAST SURGICAL HISTORY: Past Surgical History:  Procedure Laterality Date  . ADENOIDECTOMY     as a child  . CHOLECYSTECTOMY  01/12/2012   Procedure: LAPAROSCOPIC CHOLECYSTECTOMY WITH INTRAOPERATIVE CHOLANGIOGRAM;  Surgeon: [name], MD;  Location: [facility];  Service: General;  Laterality: N/A;  . CYST EXCISION     cyst removal from back  . EAR CYST EXCISION N/A 05/04/2013   Procedure: Excision subcutaneous mass posterior neck;  Surgeon: Imogene Burn. [name], MD;  Location: [facility];  Service: General;  Laterality: N/A;    SOCIAL HISTORY: Social History   Tobacco Use  . Smoking status: Never Smoker  . Smokeless tobacco: Never Used  Substance Use Topics  . Alcohol use: Yes    Comment: 1 glass wine every other day  . Drug use: No    FAMILY HISTORY: Family History  Problem Relation Age of Onset  . Cancer Mother        ovarian  . Hypertension Mother   . Hyperlipidemia Mother   . Obesity Mother   . Cancer Brother        leukemia  . Diabetes Brother   . Colon cancer Maternal Aunt 65  . Cancer Paternal Grandmother        breast  . Cancer Father 15       renal  . Kidney disease Father   . Sleep apnea Father   . Prostate cancer Neg Hx   . Esophageal cancer Neg Hx   . Rectal cancer Neg Hx   . Stomach cancer Neg Hx   . CAD Neg Hx     ROS: Review of Systems  Constitutional: Negative for weight loss.  Cardiovascular: Negative for chest pain.  Gastrointestinal: Negative for nausea and vomiting.  Musculoskeletal:       Negative for muscle weakness.  Endo/Heme/Allergies:       Negative for lightheadedness.  Psychiatric/Behavioral: Positive for depression. The patient does not have insomnia.     PHYSICAL EXAM: Blood pressure 124/77, pulse 83, temperature 98.3 F (36.8 C), temperature source Oral, height 5\' 10"  (1.778 m), weight (!) 319 lb (144.7 kg), SpO2 97 %. Body mass index is 45.77 kg/m. Physical Exam Vitals signs reviewed.  Constitutional:      Appearance: Normal appearance. He is obese.  Cardiovascular:     Rate and Bryan [name]: Normal rate.  Pulmonary:     Effort: Pulmonary effort is normal.  Musculoskeletal: Normal range of motion.  Skin:    General: Skin is warm and dry.  Neurological:     Mental Status: He is alert and oriented to person, place, and time.  Psychiatric:        Mood and Affect: Mood normal.        Behavior: Behavior normal.     RECENT LABS AND TESTS: BMET    Component Value Date/Time   NA 144  11/17/2018 0000   K 3.8 11/17/2018 0000   CL 102 11/17/2018 0000   CO2 25 11/17/2018 0000   GLUCOSE 99 11/17/2018 0000   GLUCOSE 104 (H)  06/23/2017 1028   BUN 14 11/17/2018 0000   CREATININE 0.92 11/17/2018 0000   CALCIUM 9.8 11/17/2018 0000   GFRNONAA 93 11/17/2018 0000   GFRAA 108 11/17/2018 0000   Lab Results  Component Value Date   HGBA1C 5.9 (H) 11/17/2018   HGBA1C 5.9 (H) 08/18/2018   HGBA1C 5.9 (H) 02/17/2018   HGBA1C 5.9 (H) 01/05/2018   HGBA1C 5.9 (H) 09/08/2017   Lab Results  Component Value Date   INSULIN 21.9 11/17/2018   INSULIN 24.0 08/18/2018   INSULIN 33.0 (H) 02/17/2018   INSULIN 19.4 01/05/2018   INSULIN 21.9 09/08/2017   CBC    Component Value Date/Time   WBC 6.1 08/18/2018 0830   WBC 5.9 06/23/2017 1028   RBC 4.95 08/18/2018 0830   RBC 5.03 06/23/2017 1028   HGB 14.0 08/18/2018 0830   HCT 42.7 08/18/2018 0830   PLT 253.0 06/23/2017 1028   MCV 86 08/18/2018 0830   MCH 28.3 08/18/2018 0830   MCH 28.8 01/13/2012 0328   MCHC 32.8 08/18/2018 0830   MCHC 32.9 06/23/2017 1028   RDW 13.2 08/18/2018 0830   LYMPHSABS 1.5 08/18/2018 0830   MONOABS 0.4 06/23/2017 1028   EOSABS 0.2 08/18/2018 0830   BASOSABS 0.1 08/18/2018 0830   Iron/TIBC/Ferritin/ %Sat No results found for: IRON, TIBC, FERRITIN, IRONPCTSAT Lipid Panel     Component Value Date/Time   CHOL 144 11/17/2018 0000   TRIG 126 11/17/2018 0000   HDL 43 11/17/2018 0000   CHOLHDL 3 06/23/2017 1028   VLDL 26.2 06/23/2017 1028   LDLCALC 76 11/17/2018 0000   LDLDIRECT 136.0 11/16/2016 0902   Hepatic Function Panel     Component Value Date/Time   PROT 7.2 11/17/2018 0000   ALBUMIN 4.6 11/17/2018 0000   AST 12 11/17/2018 0000   ALT 26 11/17/2018 0000   ALKPHOS 54 11/17/2018 0000   BILITOT 0.4 11/17/2018 0000   BILIDIR 0.0 02/26/2012 1353      Component Value Date/Time   TSH 1.810 08/18/2018 0830   TSH 1.400 09/08/2017 1144   TSH 1.31 06/23/2017 1028   Results for Snowden, Bence B  "[place]" (MRN [id number]) as of 12/20/2018 07:43  Ref. Range 11/17/2018 00:00  Vitamin D, 25-Hydroxy Latest Ref Range: 30.0 - 100.0 ng/mL 49.3   OBESITY BEHAVIORAL INTERVENTION VISIT  Today's visit was # 26   Starting weight: 326 lbs Starting date: 09/08/17 Today's weight : Weight: (!) 319 lb (144.7 kg)  Today's date: 12/19/2018 Total lbs lost to date: 7  ASK: We discussed the diagnosis of obesity with [name] today and [place] agreed to give Bryan [name] permission to discuss obesity behavioral modification therapy today.  ASSESS: Kendell has the diagnosis of obesity and his BMI today is 45.7. Reginald is in the action stage of change.   ADVISE: Jonahtan was educated on the multiple health risks of obesity as well as the benefit of weight loss to improve his health. He was advised of the need for long term treatment and the importance of lifestyle modifications to improve his current health and to decrease his risk of future health problems.  AGREE: Multiple dietary modification options and treatment options were discussed and Heron agreed to follow the recommendations documented in the above note.  ARRANGE: Jaris was educated on the importance of frequent visits to treat obesity as outlined per CMS and USPSTF guidelines and agreed to schedule his next follow up appointment today.  I, Marcille Blanco, am acting as Location manager for [employer],  MD  I have reviewed the above documentation for accuracy and completeness, and I agree with the above. -Dennard Nip, MD

## 2019-01-02 ENCOUNTER — Encounter (INDEPENDENT_AMBULATORY_CARE_PROVIDER_SITE_OTHER): Payer: Self-pay | Admitting: Family Medicine

## 2019-01-02 ENCOUNTER — Ambulatory Visit (INDEPENDENT_AMBULATORY_CARE_PROVIDER_SITE_OTHER): Payer: BC Managed Care – PPO | Admitting: Family Medicine

## 2019-01-02 VITALS — BP 120/74 | HR 81 | Temp 98.3°F | Ht 70.0 in | Wt 320.0 lb

## 2019-01-02 DIAGNOSIS — Z6841 Body Mass Index (BMI) 40.0 and over, adult: Secondary | ICD-10-CM | POA: Diagnosis not present

## 2019-01-02 DIAGNOSIS — E119 Type 2 diabetes mellitus without complications: Secondary | ICD-10-CM | POA: Diagnosis not present

## 2019-01-02 MED ORDER — LIRAGLUTIDE 18 MG/3ML ~~LOC~~ SOPN: 1.8000 mg | PEN_INJECTOR | Freq: Every morning | SUBCUTANEOUS | 0 refills | Status: DC

## 2019-01-03 NOTE — Progress Notes (Signed)
Office: [phone]  /  Fax: [phone]   HPI:   Chief Complaint: OBESITY Bryan [name] is here to discuss his progress with his obesity treatment plan. He is on the Category 3 plan and is following his eating plan approximately 65 % of the time. He states he is using the elliptical and weights 60 minutes 3 times per week. Bryan [name] has been following his plan closely and has gained 1 pound since his last visit. He has especially struggled in meal planning and prepping. He has not done much journaling for dinner.  His weight is (!) 320 lb (145.2 kg) today and has had a weight gain of 1 pound over a period of 2 weeks since his last visit. He has lost 6 lbs since starting treatment with [place].  Diabetes II Bryan [name] has a diagnosis of diabetes type II. Bryan [name] is working on his diet and he did not bring his blood sugar log today. He notes increasing polyphagia which is worse in the last 2 to 3 weeks. He denies nausea, vomiting, or any hypoglycemic episodes. Last A1c was 5.9 on 11/17/18. He has been working on intensive lifestyle modifications including diet, exercise, and weight loss to help control his blood glucose levels.  ASSESSMENT AND PLAN:  Type 2 diabetes mellitus without complication, without long-term current use of insulin (HCC) - Plan: liraglutide (VICTOZA) 18 MG/3ML SOPN  Class 3 severe obesity with serious comorbidity and body mass index (BMI) of 45.0 to 49.9 in adult, unspecified obesity type ([place])  PLAN:  Diabetes II Bryan [name] has been given extensive diabetes education by myself today including ideal fasting and post-prandial blood glucose readings, individual ideal Hgb A1c goals, and hypoglycemia prevention. We discussed the importance of good blood sugar control to decrease the likelihood of diabetic complications such as nephropathy, neuropathy, limb loss, blindness, coronary artery disease, and death. We discussed the importance of intensive lifestyle modification including diet,  exercise and weight loss as the first line treatment for diabetes. He agreed to continue metformin and  increase Victoza to 1.8mg  qAM #3 pens and nanoneedles with no refills. Bryan [name] agrees to continue his diabetes medications and will follow up at the agreed upon time in 2 to 3 weeks.  I spent > than 50% of the 25 minute visit on counseling as documented in the note.  Obesity Bryan [name] is currently in the action stage of change. As such, his goal is to continue with weight loss efforts. He has agreed to follow the Category 3 plan and keeping a food journal of 350 to 550 calories and 40 grams of protein for dinner. Bryan [name] has been instructed to work up to a goal of 150 minutes of combined cardio and strengthening exercise per week for weight loss and overall health benefits. We discussed the following Behavioral Modification Strategies today: increasing lean protein intake, decreasing simple carbohydrates, keeping healthy foods in the home, and work on meal planning and easy cooking plans.  Bryan [name] has agreed to follow up with our clinic in 2 to 3 weeks. He was informed of the importance of frequent follow up visits to maximize his success with intensive lifestyle modifications for his multiple health conditions.  ALLERGIES: Allergies  Allergen Reactions  . Lisinopril Cough    MEDICATIONS: Current Outpatient Medications on File Prior to Visit  Medication Sig Dispense Refill  . aspirin 81 MG tablet Take 81 mg by mouth daily.    [name] atorvastatin (LIPITOR) 20 MG tablet Take 1 tablet (20 mg total) by mouth at bedtime.  90 tablet 3  . azelastine (ASTELIN) 0.1 % nasal spray Place 2 sprays into both nostrils at bedtime as needed for rhinitis. Use in each nostril as directed    . buPROPion (WELLBUTRIN SR) 150 MG 12 hr tablet TAKE 1 TABLET(150 MG) BY MOUTH DAILY 30 tablet 0  . CO ENZYME Q-10 PO Take by mouth daily.    . fish oil-omega-3 fatty acids 1000 MG capsule Take 1 g by mouth daily.    .  hydrochlorothiazide (HYDRODIURIL) 25 MG tablet TAKE 1 TABLET(25 MG) BY MOUTH DAILY 30 tablet 0  . Insulin Pen Needle 32G X 4 MM MISC 1 each by Does not apply route daily. 50 each 0  . Melatonin 1 MG CAPS Take 1 mg by mouth at bedtime as needed.     . metFORMIN (GLUCOPHAGE) 500 MG tablet Take 1 tablet (500 mg total) by mouth 2 (two) times daily with a meal. 60 tablet 0  . Multiple Vitamins-Minerals (CENTRUM PO) Take 1 tablet by mouth daily.    Bryan [name] 450 MG CAPS Take 450 mg by mouth at bedtime as needed.     . Vitamin D, Ergocalciferol, (DRISDOL) 1.25 MG (50000 UT) CAPS capsule Take 1 capsule (50,000 Units total) by mouth every 7 (seven) days. 4 capsule 0   No current facility-administered medications on file prior to visit.     PAST MEDICAL HISTORY: Past Medical History:  Diagnosis Date  . Dental crowns present   . Hyperlipidemia   . Lower back pain   . Nephrolithiasis 2017  . Prediabetes 09/04/2014  . Seasonal allergies   . Sebaceous cyst 04/2013   upper back  . Sleep apnea    uses CPAP nightly  . Urolithiasis 10/2016    PAST SURGICAL HISTORY: Past Surgical History:  Procedure Laterality Date  . ADENOIDECTOMY     as a child  . CHOLECYSTECTOMY  01/12/2012   Procedure: LAPAROSCOPIC CHOLECYSTECTOMY WITH INTRAOPERATIVE CHOLANGIOGRAM;  Surgeon: Bryan Burn. Tsuei, MD;  Location: [facility];  Service: General;  Laterality: N/A;  . CYST EXCISION     cyst removal from back  . EAR CYST EXCISION N/A 05/04/2013   Procedure: Excision subcutaneous mass posterior neck;  Surgeon: Bryan Burn. [name], MD;  Location: [facility];  Service: General;  Laterality: N/A;    SOCIAL HISTORY: Social History   Tobacco Use  . Smoking status: Never Smoker  . Smokeless tobacco: Never Used  Substance Use Topics  . Alcohol use: Yes    Comment: 1 glass wine every other day  . Drug use: No    FAMILY HISTORY: Family History  Problem Relation Age of Onset  . Cancer Mother         ovarian  . Hypertension Mother   . Hyperlipidemia Mother   . Obesity Mother   . Cancer Brother        leukemia  . Diabetes Brother   . Colon cancer Maternal Aunt 81  . Cancer Paternal Grandmother        breast  . Cancer Father 86       renal  . Kidney disease Father   . Sleep apnea Father   . Prostate cancer Neg Hx   . Esophageal cancer Neg Hx   . Rectal cancer Neg Hx   . Stomach cancer Neg Hx   . CAD Neg Hx    ROS: Review of Systems  Gastrointestinal: Negative for nausea and vomiting.  Endo/Heme/Allergies:       Negative  for hypoglycemia. Positive for polyphagia.   PHYSICAL EXAM: Blood pressure 120/74, pulse 81, temperature 98.3 F (36.8 C), temperature source Oral, height 5\' 10"  (1.778 m), weight (!) 320 lb (145.2 kg), SpO2 96 %. Body mass index is 45.92 kg/m. Physical Exam Vitals signs reviewed.  Constitutional:      Appearance: Normal appearance. He is obese.  Cardiovascular:     Rate and Rhythm: Normal rate.  Pulmonary:     Effort: Pulmonary effort is normal.  Musculoskeletal: Normal range of motion.  Skin:    General: Skin is warm and dry.  Neurological:     Mental Status: He is alert and oriented to person, place, and time.  Psychiatric:        Mood and Affect: Mood normal.        Behavior: Behavior normal.    RECENT LABS AND TESTS: BMET    Component Value Date/Time   NA 144 11/17/2018 0000   K 3.8 11/17/2018 0000   CL 102 11/17/2018 0000   CO2 25 11/17/2018 0000   GLUCOSE 99 11/17/2018 0000   GLUCOSE 104 (H) 06/23/2017 1028   BUN 14 11/17/2018 0000   CREATININE 0.92 11/17/2018 0000   CALCIUM 9.8 11/17/2018 0000   GFRNONAA 93 11/17/2018 0000   GFRAA 108 11/17/2018 0000   Lab Results  Component Value Date   HGBA1C 5.9 (H) 11/17/2018   HGBA1C 5.9 (H) 08/18/2018   HGBA1C 5.9 (H) 02/17/2018   HGBA1C 5.9 (H) 01/05/2018   HGBA1C 5.9 (H) 09/08/2017   Lab Results  Component Value Date   INSULIN 21.9 11/17/2018   INSULIN 24.0 08/18/2018    INSULIN 33.0 (H) 02/17/2018   INSULIN 19.4 01/05/2018   INSULIN 21.9 09/08/2017   CBC    Component Value Date/Time   WBC 6.1 08/18/2018 0830   WBC 5.9 06/23/2017 1028   RBC 4.95 08/18/2018 0830   RBC 5.03 06/23/2017 1028   HGB 14.0 08/18/2018 0830   HCT 42.7 08/18/2018 0830   PLT 253.0 06/23/2017 1028   MCV 86 08/18/2018 0830   MCH 28.3 08/18/2018 0830   MCH 28.8 01/13/2012 0328   MCHC 32.8 08/18/2018 0830   MCHC 32.9 06/23/2017 1028   RDW 13.2 08/18/2018 0830   LYMPHSABS 1.5 08/18/2018 0830   MONOABS 0.4 06/23/2017 1028   EOSABS 0.2 08/18/2018 0830   BASOSABS 0.1 08/18/2018 0830   Iron/TIBC/Ferritin/ %Sat No results found for: IRON, TIBC, FERRITIN, IRONPCTSAT Lipid Panel     Component Value Date/Time   CHOL 144 11/17/2018 0000   TRIG 126 11/17/2018 0000   HDL 43 11/17/2018 0000   CHOLHDL 3 06/23/2017 1028   VLDL 26.2 06/23/2017 1028   LDLCALC 76 11/17/2018 0000   LDLDIRECT 136.0 11/16/2016 0902   Hepatic Function Panel     Component Value Date/Time   PROT 7.2 11/17/2018 0000   ALBUMIN 4.6 11/17/2018 0000   AST 12 11/17/2018 0000   ALT 26 11/17/2018 0000   ALKPHOS 54 11/17/2018 0000   BILITOT 0.4 11/17/2018 0000   BILIDIR 0.0 02/26/2012 1353      Component Value Date/Time   TSH 1.810 08/18/2018 0830   TSH 1.400 09/08/2017 1144   TSH 1.31 06/23/2017 1028   Results for Goodwine, Lyndel B "[place]" (MRN [id number]) as of 01/03/2019 09:48  Ref. Range 11/17/2018 00:00  Vitamin D, 25-Hydroxy Latest Ref Range: 30.0 - 100.0 ng/mL 49.3    OBESITY BEHAVIORAL INTERVENTION VISIT  Today's visit was # 27   Starting weight: 326 lbs Starting  date: 09/08/17 Today's weight : Weight: (!) 320 lb (145.2 kg)  Today's date: 01/02/2019 Total lbs lost to date: 6  ASK: We discussed the diagnosis of obesity with Bryan [name] today and Bryan [name] agreed to give [place] permission to discuss obesity behavioral modification therapy today.  ASSESS: Bryan [name] has the diagnosis of obesity  and his BMI today is 45.9. Bryan [name] is in the action stage of change.   ADVISE: Luman was educated on the multiple health risks of obesity as well as the benefit of weight loss to improve his health. He was advised of the need for long term treatment and the importance of lifestyle modifications to improve his current health and to decrease his risk of future health problems.  AGREE: Multiple dietary modification options and treatment options were discussed and Bryan [name] agreed to follow the recommendations documented in the above note.  ARRANGE: Bryan [name] was educated on the importance of frequent visits to treat obesity as outlined per CMS and USPSTF guidelines and agreed to schedule his next follow up appointment today.  I, Bryan [name], am acting as transcriptionist for Bryan Skeans, MD  I have reviewed the above documentation for accuracy and completeness, and I agree with the above. -Dennard Nip, MD

## 2019-01-24 ENCOUNTER — Encounter (INDEPENDENT_AMBULATORY_CARE_PROVIDER_SITE_OTHER): Payer: Self-pay | Admitting: Family Medicine

## 2019-01-24 ENCOUNTER — Ambulatory Visit (INDEPENDENT_AMBULATORY_CARE_PROVIDER_SITE_OTHER): Payer: BC Managed Care – PPO | Admitting: Family Medicine

## 2019-01-24 VITALS — BP 130/80 | HR 85 | Temp 98.0°F | Ht 70.0 in | Wt 318.0 lb

## 2019-01-24 DIAGNOSIS — E559 Vitamin D deficiency, unspecified: Secondary | ICD-10-CM | POA: Diagnosis not present

## 2019-01-24 DIAGNOSIS — F3289 Other specified depressive episodes: Secondary | ICD-10-CM | POA: Diagnosis not present

## 2019-01-24 DIAGNOSIS — I10 Essential (primary) hypertension: Secondary | ICD-10-CM

## 2019-01-24 DIAGNOSIS — Z9189 Other specified personal risk factors, not elsewhere classified: Secondary | ICD-10-CM

## 2019-01-24 DIAGNOSIS — Z6841 Body Mass Index (BMI) 40.0 and over, adult: Secondary | ICD-10-CM | POA: Diagnosis not present

## 2019-01-24 DIAGNOSIS — E119 Type 2 diabetes mellitus without complications: Secondary | ICD-10-CM

## 2019-01-24 MED ORDER — INSULIN PEN NEEDLE 32G X 4 MM MISC: 1.0000 | Freq: Every day | 0 refills | Status: DC

## 2019-01-24 MED ORDER — VITAMIN D (ERGOCALCIFEROL) 1.25 MG (50000 UNIT) PO CAPS: 50000.0000 [IU] | ORAL_CAPSULE | ORAL | 0 refills | Status: DC

## 2019-01-24 MED ORDER — HYDROCHLOROTHIAZIDE 25 MG PO TABS: ORAL_TABLET | ORAL | 0 refills | Status: DC

## 2019-01-24 MED ORDER — BUPROPION HCL ER (SR) 150 MG PO TB12: ORAL_TABLET | ORAL | 0 refills | Status: DC

## 2019-01-24 MED ORDER — LIRAGLUTIDE 18 MG/3ML ~~LOC~~ SOPN: 1.8000 mg | PEN_INJECTOR | Freq: Every morning | SUBCUTANEOUS | 0 refills | Status: DC

## 2019-01-24 MED ORDER — METFORMIN HCL 500 MG PO TABS: 500.0000 mg | ORAL_TABLET | Freq: Two times a day (BID) | ORAL | 0 refills | Status: DC

## 2019-01-25 NOTE — Progress Notes (Signed)
Office: [phone]  /  Fax: [phone]   HPI:   Chief Complaint: OBESITY Bryan [name] is here to discuss his progress with his obesity treatment plan. He is on the Category 3 plan and is following his eating plan approximately 65 % of the time. He states he is walking and using the elliptical 45 minutes 3 times per week. Bryan [name] continues to do well with weight loss. He is doing better with meal planning and prepping. He is going to be moving soon and is working on getting his home ready to sell, which involves a fair amount of exercise.  His weight is (!) 318 lb (144.2 kg) today and has had a weight loss of 2 pounds over a period of 3 weeks since his last visit. He has lost 8 lbs since starting treatment with [place].  Diabetes II Bryan [name] has a diagnosis of diabetes type II and is well controlled on medications and diet. Bryan [name]'s last A1c was 5.9. He denies any hypoglycemic episodes. He has been working on intensive lifestyle modifications including diet, exercise, and weight loss to help control his blood glucose levels.  Hypertension Bryan [name] is a 58 y.o. male with hypertension. Bryan [name]'s blood pressure is currently stable on medications. He is working on weight loss to help control his blood pressure with the goal of decreasing his risk of heart attack and stroke. Bryan [name] denies chest pain or lightheadedness.  At risk for cardiovascular disease Bryan [name] is at a higher than average risk for cardiovascular disease due to hypertension and obesity. He currently denies any chest pain.  Vitamin D deficiency Bryan [name] has a diagnosis of vitamin D deficiency. He is currently stable on vit D and denies nausea, vomiting, or muscle weakness.  Depression with emotional eating behaviors Bryan [name]'s mood is stable on Wellbutrin and he is doing well with emotional eating and using food for comfort to the extent that it is negatively impacting his health. He often snacks when he is not hungry. Bryan [name]  sometimes feels he is out of control and then feels guilty that he made poor food choices. He has been working on behavior modification techniques to help reduce his emotional eating and has been somewhat successful.   ASSESSMENT AND PLAN:  Essential hypertension - Plan: hydrochlorothiazide (HYDRODIURIL) 25 MG tablet  Vitamin D deficiency - Plan: Vitamin D, Ergocalciferol, (DRISDOL) 1.25 MG (50000 UT) CAPS capsule  Type 2 diabetes mellitus without complication, without long-term current use of insulin (Bryan [name]) - Plan: metFORMIN (GLUCOPHAGE) 500 MG tablet, liraglutide (VICTOZA) 18 MG/3ML SOPN, Insulin Pen Needle 32G X 4 MM MISC  Other depression - with emotional eating - Plan: buPROPion (WELLBUTRIN SR) 150 MG 12 hr tablet  At risk for heart disease  Class 3 severe obesity with serious comorbidity and body mass index (BMI) of 45.0 to 49.9 in adult, unspecified obesity type (Bryan [name])  PLAN:  Diabetes II Bryan [name] has been given extensive diabetes education by myself today including ideal fasting and post-prandial blood glucose readings, individual ideal Hgb A1c goals, and hypoglycemia prevention. We discussed the importance of good blood sugar control to decrease the likelihood of diabetic complications such as nephropathy, neuropathy, limb loss, blindness, coronary artery disease, and death. We discussed the importance of intensive lifestyle modification including diet, exercise and weight loss as the first line treatment for diabetes. Bryan [name] agrees to continue his metformin 500mg  BID with a meal #60 with no refills and Victoza 1.8mg  SubQ qd #3 pens with pen needles #50 with no refills. Bryan [name] will follow  up at the agreed upon time in 3 to 4 weeks.  Hypertension We discussed sodium restriction, working on healthy weight loss, and a regular exercise program as the means to achieve improved blood pressure control. We will continue to monitor his blood pressure as well as his progress with the above  lifestyle modifications. He will continue his HCTZ 25mg  qd #30 with no refills and will watch for signs of hypotension as he continues his lifestyle modifications. Bryan [name] agreed with this plan and agreed to follow up as directed.  Cardiovascular risk counseling Bryan [name] was given extended (15 minutes) coronary artery disease prevention counseling today. He is 58 y.o. male and has risk factors for heart disease including hypertension and obesity. We discussed intensive lifestyle modifications today with an emphasis on specific weight loss instructions and strategies. Pt was also informed of the importance of increasing exercise and decreasing saturated fats to help prevent heart disease.  Vitamin D Deficiency Bryan [name] was informed that low vitamin D levels contributes to fatigue and are associated with obesity, breast, and colon cancer. Bryan [name] agrees to continue to take prescription Vit D @50 ,000 IU every week #4 with no refills and will follow up for routine testing of vitamin D, at least 2-3 times per year. He was informed of the risk of over-replacement of vitamin D and agrees to not increase her dose unless she discusses this with [place] first. Bryan [name] agrees to follow up in 3 to 4 weeks as directed.  Depression with Emotional Eating Behaviors We discussed behavior modification techniques today to help Bryan [name] deal with his emotional eating and depression. He has agreed to take Wellbutrin SR 150mg  qd #30 with no refills and agreed to follow up as directed.  Obesity Bryan [name] is currently in the action stage of change. As such, his goal is to continue with weight loss efforts. He has agreed to follow the Category 3 plan and keep a food journal with 350 to 550 calories and 40+ grams of protein for supper. Bryan [name] has been instructed to work up to a goal of 150 minutes of combined cardio and strengthening exercise per week for weight loss and overall health benefits. We discussed the following Behavioral  Modification Strategies today: increasing lean protein intake, decreasing simple carbohydrates, no skipping meals, better snacking choices, and ways to avoid night time snacking.  Bryan [name] has agreed to follow up with our clinic in 3 to 4 weeks. He was informed of the importance of frequent follow up visits to maximize his success with intensive lifestyle modifications for his multiple health conditions.  ALLERGIES: Allergies  Allergen Reactions  . Lisinopril Cough    MEDICATIONS: Current Outpatient Medications on File Prior to Visit  Medication Sig Dispense Refill  . aspirin 81 MG tablet Take 81 mg by mouth daily.    [name] atorvastatin (LIPITOR) 20 MG tablet Take 1 tablet (20 mg total) by mouth at bedtime. 90 tablet 3  . azelastine (ASTELIN) 0.1 % nasal spray Place 2 sprays into both nostrils at bedtime as needed for rhinitis. Use in each nostril as directed    . CO ENZYME Q-10 PO Take by mouth daily.    . fish oil-omega-3 fatty acids 1000 MG capsule Take 1 g by mouth daily.    . Melatonin 1 MG CAPS Take 1 mg by mouth at bedtime as needed.     . Multiple Vitamins-Minerals (CENTRUM PO) Take 1 tablet by mouth daily.    Cristino Martes Root 450 MG CAPS Take 450 mg by mouth  at bedtime as needed.      No current facility-administered medications on file prior to visit.     PAST MEDICAL HISTORY: Past Medical History:  Diagnosis Date  . Dental crowns present   . Hyperlipidemia   . Lower back pain   . Nephrolithiasis 2017  . Prediabetes 09/04/2014  . Seasonal allergies   . Sebaceous cyst 04/2013   upper back  . Sleep apnea    uses CPAP nightly  . Urolithiasis 10/2016    PAST SURGICAL HISTORY: Past Surgical History:  Procedure Laterality Date  . ADENOIDECTOMY     as a child  . CHOLECYSTECTOMY  01/12/2012   Procedure: LAPAROSCOPIC CHOLECYSTECTOMY WITH INTRAOPERATIVE CHOLANGIOGRAM;  Surgeon: [name], MD;  Location: [facility];  Service: General;  Laterality: N/A;  . CYST EXCISION      cyst removal from back  . EAR CYST EXCISION N/A 05/04/2013   Procedure: Excision subcutaneous mass posterior neck;  Surgeon: Imogene Burn. [name], MD;  Location: [facility];  Service: General;  Laterality: N/A;    SOCIAL HISTORY: Social History   Tobacco Use  . Smoking status: Never Smoker  . Smokeless tobacco: Never Used  Substance Use Topics  . Alcohol use: Yes    Comment: 1 glass wine every other day  . Drug use: No    FAMILY HISTORY: Family History  Problem Relation Age of Onset  . Cancer Mother        ovarian  . Hypertension Mother   . Hyperlipidemia Mother   . Obesity Mother   . Cancer Brother        leukemia  . Diabetes Brother   . Colon cancer Maternal Aunt 56  . Cancer Paternal Grandmother        breast  . Cancer Father 41       renal  . Kidney disease Father   . Sleep apnea Father   . Prostate cancer Neg Hx   . Esophageal cancer Neg Hx   . Rectal cancer Neg Hx   . Stomach cancer Neg Hx   . CAD Neg Hx     ROS: Review of Systems  Constitutional: Positive for weight loss.  Cardiovascular: Negative for chest pain.  Gastrointestinal: Negative for nausea and vomiting.  Musculoskeletal:       Negative for muscle weakness.  Neurological:       Negative for lightheadedness.  Endo/Heme/Allergies:       Negative for hypoglycemia.  Psychiatric/Behavioral: Positive for depression.    PHYSICAL EXAM: Blood pressure 130/80, pulse 85, temperature 98 F (36.7 C), temperature source Oral, height 5\' 10"  (1.778 m), weight (!) 318 lb (144.2 kg), SpO2 96 %. Body mass index is 45.63 kg/m. Physical Exam Vitals signs reviewed.  Constitutional:      Appearance: Normal appearance. He is obese.  Cardiovascular:     Rate and Rhythm: Normal rate.  Pulmonary:     Effort: Pulmonary effort is normal.  Musculoskeletal: Normal range of motion.  Skin:    General: Skin is warm and dry.  Neurological:     Mental Status: He is alert and oriented to person, place,  and time.  Psychiatric:        Mood and Affect: Mood normal.        Behavior: Behavior normal.     RECENT LABS AND TESTS: BMET    Component Value Date/Time   NA 144 11/17/2018 0000   K 3.8 11/17/2018 0000   CL 102 11/17/2018 0000  CO2 25 11/17/2018 0000   GLUCOSE 99 11/17/2018 0000   GLUCOSE 104 (H) 06/23/2017 1028   BUN 14 11/17/2018 0000   CREATININE 0.92 11/17/2018 0000   CALCIUM 9.8 11/17/2018 0000   GFRNONAA 93 11/17/2018 0000   GFRAA 108 11/17/2018 0000   Lab Results  Component Value Date   HGBA1C 5.9 (H) 11/17/2018   HGBA1C 5.9 (H) 08/18/2018   HGBA1C 5.9 (H) 02/17/2018   HGBA1C 5.9 (H) 01/05/2018   HGBA1C 5.9 (H) 09/08/2017   Lab Results  Component Value Date   INSULIN 21.9 11/17/2018   INSULIN 24.0 08/18/2018   INSULIN 33.0 (H) 02/17/2018   INSULIN 19.4 01/05/2018   INSULIN 21.9 09/08/2017   CBC    Component Value Date/Time   WBC 6.1 08/18/2018 0830   WBC 5.9 06/23/2017 1028   RBC 4.95 08/18/2018 0830   RBC 5.03 06/23/2017 1028   HGB 14.0 08/18/2018 0830   HCT 42.7 08/18/2018 0830   PLT 253.0 06/23/2017 1028   MCV 86 08/18/2018 0830   MCH 28.3 08/18/2018 0830   MCH 28.8 01/13/2012 0328   MCHC 32.8 08/18/2018 0830   MCHC 32.9 06/23/2017 1028   RDW 13.2 08/18/2018 0830   LYMPHSABS 1.5 08/18/2018 0830   MONOABS 0.4 06/23/2017 1028   EOSABS 0.2 08/18/2018 0830   BASOSABS 0.1 08/18/2018 0830   Iron/TIBC/Ferritin/ %Sat No results found for: IRON, TIBC, FERRITIN, IRONPCTSAT Lipid Panel     Component Value Date/Time   CHOL 144 11/17/2018 0000   TRIG 126 11/17/2018 0000   HDL 43 11/17/2018 0000   CHOLHDL 3 06/23/2017 1028   VLDL 26.2 06/23/2017 1028   LDLCALC 76 11/17/2018 0000   LDLDIRECT 136.0 11/16/2016 0902   Hepatic Function Panel     Component Value Date/Time   PROT 7.2 11/17/2018 0000   ALBUMIN 4.6 11/17/2018 0000   AST 12 11/17/2018 0000   ALT 26 11/17/2018 0000   ALKPHOS 54 11/17/2018 0000   BILITOT 0.4 11/17/2018 0000    BILIDIR 0.0 02/26/2012 1353      Component Value Date/Time   TSH 1.810 08/18/2018 0830   TSH 1.400 09/08/2017 1144   TSH 1.31 06/23/2017 1028   Results for Glaus, Jyair B "[place]" (MRN [id number]) as of 01/25/2019 07:36  Ref. Range 11/17/2018 00:00  Vitamin D, 25-Hydroxy Latest Ref Range: 30.0 - 100.0 ng/mL 49.3    OBESITY BEHAVIORAL INTERVENTION VISIT  Today's visit was # 28   Starting weight: 326 lbs Starting date: 09/08/17 Today's weight : Weight: (!) 318 lb (144.2 kg)  Today's date: 01/24/2019 Total lbs lost to date: 8    Most Recent Value 02/01/2014 - 01/31/2019  Height 5\' 10"  (1.778 m) 01/24/2019  Weight 318 lb (144.2 kg) (A) 01/24/2019  BMI (Calculated) 45.63 01/24/2019  BLOOD PRESSURE - SYSTOLIC 371 6/96/7893  BLOOD PRESSURE - DIASTOLIC 80 07/16/1750  Waist Measurement  55 inches 09/08/2017   Body Fat % 39.7 % 01/24/2019  Total Body Water (lbs) 136.4 lbs 01/24/2019  RMR 1637 09/08/2017   ASK: We discussed the diagnosis of obesity with [name] today and Finnick agreed to give [place] permission to discuss obesity behavioral modification therapy today.  ASSESS: Edman has the diagnosis of obesity and his BMI today is 45.6. Yazir is in the action stage of change.   ADVISE: Siddarth was educated on the multiple health risks of obesity as well as the benefit of weight loss to improve his health. He was advised of the need for long term  treatment and the importance of lifestyle modifications to improve his current health and to decrease his risk of future health problems.  AGREE: Multiple dietary modification options and treatment options were discussed and Deanglo agreed to follow the recommendations documented in the above note.  ARRANGE: Shiven was educated on the importance of frequent visits to treat obesity as outlined per CMS and USPSTF guidelines and agreed to schedule his next follow up appointment today.  IMarcille Blanco, CMA, am acting as  transcriptionist for Starlyn Skeans, MD  I have reviewed the above documentation for accuracy and completeness, and I agree with the above. -Dennard Nip, MD

## 2019-02-17 ENCOUNTER — Encounter (INDEPENDENT_AMBULATORY_CARE_PROVIDER_SITE_OTHER): Payer: Self-pay | Admitting: Family Medicine

## 2019-02-20 ENCOUNTER — Encounter (INDEPENDENT_AMBULATORY_CARE_PROVIDER_SITE_OTHER): Payer: Self-pay

## 2019-02-20 ENCOUNTER — Ambulatory Visit (INDEPENDENT_AMBULATORY_CARE_PROVIDER_SITE_OTHER): Payer: BC Managed Care – PPO | Admitting: Family Medicine

## 2019-03-06 ENCOUNTER — Encounter (INDEPENDENT_AMBULATORY_CARE_PROVIDER_SITE_OTHER): Payer: Self-pay

## 2019-03-14 ENCOUNTER — Encounter: Payer: Self-pay | Admitting: Internal Medicine

## 2019-03-14 ENCOUNTER — Other Ambulatory Visit: Payer: Self-pay

## 2019-03-14 ENCOUNTER — Ambulatory Visit (INDEPENDENT_AMBULATORY_CARE_PROVIDER_SITE_OTHER): Payer: BC Managed Care – PPO | Admitting: Internal Medicine

## 2019-03-14 DIAGNOSIS — R030 Elevated blood-pressure reading, without diagnosis of hypertension: Secondary | ICD-10-CM

## 2019-03-14 DIAGNOSIS — R05 Cough: Secondary | ICD-10-CM | POA: Diagnosis not present

## 2019-03-14 DIAGNOSIS — R059 Cough, unspecified: Secondary | ICD-10-CM

## 2019-03-14 MED ORDER — HYDROCODONE-HOMATROPINE 5-1.5 MG/5ML PO SYRP: 5.0000 mL | ORAL_SOLUTION | Freq: Every evening | ORAL | 0 refills | Status: DC | PRN

## 2019-03-14 MED ORDER — AZELASTINE HCL 0.1 % NA SOLN: 2.0000 | Freq: Every evening | NASAL | 5 refills | Status: AC | PRN

## 2019-03-14 NOTE — Progress Notes (Signed)
Subjective:    Patient ID: Bryan [name], male    DOB: 07/07/1961, 58 y.o.   MRN: [id number]  DOS:  03/14/2019 Type of visit - description: Virtual Visit via Video Note  I connected with@ on 03/15/19 at  9:00 AM EDT by a video enabled telemedicine application and verified that I am speaking with the correct person using two identifiers.   THIS ENCOUNTER IS A VIRTUAL VISIT DUE TO COVID-19 - PATIENT WAS NOT SEEN IN THE OFFICE. PATIENT HAS CONSENTED TO VIRTUAL VISIT / TELEMEDICINE VISIT   Location of patient: home  Location of provider: office  I discussed the limitations of evaluation and management by telemedicine and the availability of in person appointments. The patient expressed understanding and agreed to proceed.  History of Present Illness: Acute  visit Symptoms are started 3 weeks ago with on and off cough, mostly dry, occasional brown sputum. He is taking Zyrtec, Flonase, Mucinex. Wonders if this is allergies. Occasional difficult time sleeping   Review of Systems No fever chills No runny nose, sore throat or itchy eyes but some sneezing and sinus congestion are present. No chest pain no difficulty breathing He is following all the CDC guidelines  to prevent COVID-19, no recent travel, he has mostly stay at home, nobody at home is sick, no sick contacts that he knows of.  Past Medical History:  Diagnosis Date  . Dental crowns present   . Hyperlipidemia   . Lower back pain   . Nephrolithiasis 2017  . Prediabetes 09/04/2014  . Seasonal allergies   . Sebaceous cyst 04/2013   upper back  . Sleep apnea    uses CPAP nightly  . Urolithiasis 10/2016    Past Surgical History:  Procedure Laterality Date  . ADENOIDECTOMY     as a child  . CHOLECYSTECTOMY  01/12/2012   Procedure: LAPAROSCOPIC CHOLECYSTECTOMY WITH INTRAOPERATIVE CHOLANGIOGRAM;  Surgeon: [name], MD;  Location: [facility];  Service: General;  Laterality: N/A;  . CYST EXCISION     cyst removal from back   . EAR CYST EXCISION N/A 05/04/2013   Procedure: Excision subcutaneous mass posterior neck;  Surgeon: Imogene Burn. [name], MD;  Location: [place] [place];  Service: General;  Laterality: N/A;    Social History   Socioeconomic History  . Marital status: Married    Spouse name: Patty  . Number of children: 3  . Years of education: Not on file  . Highest education level: Not on file  Occupational History  . Occupation:  Retail banker, A/C units     Employer: [employer]  Social Needs  . Financial resource strain: Not on file  . Food insecurity:    Worry: Not on file    Inability: Not on file  . Transportation needs:    Medical: Not on file    Non-medical: Not on file  Tobacco Use  . Smoking status: Never Smoker  . Smokeless tobacco: Never Used  Substance and Sexual Activity  . Alcohol use: Yes    Comment: 1 glass wine every other day  . Drug use: No  . Sexual activity: Not on file  Lifestyle  . Physical activity:    Days per week: Not on file    Minutes per session: Not on file  . Stress: Not on file  Relationships  . Social connections:    Talks on phone: Not on file    Gets together: Not on file    Attends religious service: Not  on file    Active member of club or organization: Not on file    Attends meetings of clubs or organizations: Not on file    Relationship status: Not on file  . Intimate partner violence:    Fear of current or ex partner: Not on file    Emotionally abused: Not on file    Physically abused: Not on file    Forced sexual activity: Not on file  Other Topics Concern  . 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 Always use your most recent med list.         aspirin 81 MG tablet Take 81 mg by mouth daily.   atorvastatin 20 MG tablet Commonly known as:  LIPITOR Take 1 tablet (20 mg total) by mouth at bedtime.   azelastine 0.1 % nasal spray Commonly known as:  ASTELIN Place 2 sprays into both nostrils at bedtime as needed for rhinitis. Use in each nostril as directed   buPROPion 150 MG 12 hr tablet Commonly known as:  WELLBUTRIN SR TAKE 1 TABLET(150 MG) BY MOUTH DAILY   CENTRUM PO Take 1 tablet by mouth daily.   CO ENZYME Q-10 PO Take by mouth daily.   fish oil-omega-3 fatty acids 1000 MG capsule Take 1 g by mouth daily.   hydrochlorothiazide 25 MG tablet Commonly known as:  HYDRODIURIL TAKE 1 TABLET(25 MG) BY MOUTH DAILY   HYDROcodone-homatropine 5-1.5 MG/5ML syrup Commonly known as:  HYCODAN Take 5 mLs by mouth at bedtime as needed for cough.   Insulin Pen Needle 32G X 4 MM Misc 1 each by Does not apply route daily.   liraglutide 18 MG/3ML Sopn Commonly known as:  VICTOZA Inject 0.3 mLs (1.8 mg total) into the skin every morning.   Melatonin 1 MG Caps Take 1 mg by mouth at bedtime as needed.   metFORMIN 500 MG tablet Commonly known as:  GLUCOPHAGE Take 1 tablet (500 mg total) by mouth 2 (two) times daily with a meal.   Valerian Root 450 MG Caps Take 450 mg by mouth at bedtime as needed.   Vitamin D (Ergocalciferol) 1.25 MG (50000 UT) Caps capsule Commonly known as:  DRISDOL Take 1 capsule (50,000 Units total) by mouth every 7 (seven) days.           Objective:   Physical Exam There were no vitals taken for this visit. Video conference, alert oriented x3, in no apparent distress    Assessment      Assessment Prediabetes Hyperlipidemia Insomnia: on OTCs Morbid obesity, BMI 46 OSA, CPAP Seasonal allergies Kidney stone (first) 10-2016  PLAN: Cough: Persisting cough for 3 weeks with occasional brown sputum and sinus congestion, currently on Zyrtec, Flonase, Mucinex. This is   Allergy/pollen  season, symptoms probably related to that, less likely to be a viral syndrome or Covid 19. Recommend to continue the same medicines, add Astelin, will send a hydrocodone syrup to take at night as from time to time he cannot sleep. If he is not improving in the  next few days he will call the office for possible antibiotics (atypical infection?). Elevated BP: h/o encouraged to check ambulatory BPs Next visit around 06-2019 CPX.     I discussed the assessment and treatment plan with the patient. The patient was provided an opportunity to ask questions and all were answered. The patient agreed with the plan and demonstrated an understanding of the instructions.   The patient was advised to call back or seek an in-person evaluation if the symptoms worsen or if the condition fails to improve as anticipated.

## 2019-03-14 NOTE — Telephone Encounter (Signed)
LMOM to call to schedule virtual visit.

## 2019-03-15 NOTE — Assessment & Plan Note (Signed)
Cough: Persisting cough for 3 weeks with occasional brown sputum and sinus congestion, currently on Zyrtec, Flonase, Mucinex. This is  Allergy/pollen  season, symptoms probably related to that, less likely to be a viral syndrome or Covid 19. Recommend to continue the same medicines, add Astelin, will send a hydrocodone syrup to take at night as from time to time he cannot sleep. If he is not improving in the next few days he will call the office for possible antibiotics (atypical infection?). Elevated BP: h/o encouraged to check ambulatory BPs Next visit around 06-2019 CPX.

## 2019-05-10 ENCOUNTER — Other Ambulatory Visit: Payer: Self-pay

## 2019-05-10 ENCOUNTER — Encounter: Payer: Self-pay | Admitting: Podiatry

## 2019-05-10 ENCOUNTER — Ambulatory Visit: Payer: BC Managed Care – PPO | Admitting: Podiatry

## 2019-05-10 VITALS — Temp 98.1°F

## 2019-05-10 DIAGNOSIS — L600 Ingrowing nail: Secondary | ICD-10-CM | POA: Diagnosis not present

## 2019-05-10 MED ORDER — GENTAMICIN SULFATE 0.1 % EX CREA: 1.0000 "application " | TOPICAL_CREAM | Freq: Two times a day (BID) | CUTANEOUS | 1 refills | Status: DC

## 2019-05-10 NOTE — Patient Instructions (Addendum)

## 2019-05-13 NOTE — Progress Notes (Signed)
   Subjective: Patient presents today for evaluation of pain to the lateral border of the left second toenail that began a few weeks ago. Patient is concerned for possible ingrown nail. He reports associated mild drainage and redness. Touching the toe increases the pain. He has not done anything for treatment. Patient presents today for further treatment and evaluation.  Past Medical History:  Diagnosis Date  . Dental crowns present   . Hyperlipidemia   . Lower back pain   . Nephrolithiasis 2017  . Prediabetes 09/04/2014  . Seasonal allergies   . Sebaceous cyst 04/2013   upper back  . Sleep apnea    uses CPAP nightly  . Urolithiasis 10/2016    Objective:  General: Well developed, nourished, in no acute distress, alert and oriented x3   Dermatology: Skin is warm, dry and supple bilateral. Lateral border left 2nd toe appears to be erythematous with evidence of an ingrowing nail. Pain on palpation noted to the border of the nail fold. The remaining nails appear unremarkable at this time. There are no open sores, lesions.  Vascular: Dorsalis Pedis artery and Posterior Tibial artery pedal pulses palpable. No lower extremity edema noted.   Neruologic: Grossly intact via light touch bilateral.  Musculoskeletal: Muscular strength within normal limits in all groups bilateral. Normal range of motion noted to all pedal and ankle joints.   Assesement: #1 Paronychia with ingrowing nail lateral border left second toe #2 Pain in toe #3 Incurvated nail  Plan of Care:  1. Patient evaluated.  2. Discussed treatment alternatives and plan of care. Explained nail avulsion procedure and post procedure course to patient. 3. Patient opted for permanent partial nail avulsion of the lateral border of the left 2nd toe.  4. Prior to procedure, local anesthesia infiltration utilized using 3 ml of a 50:50 mixture of 2% plain lidocaine and 0.5% plain marcaine in a normal digital block fashion and a betadine  prep performed.  5. Partial permanent nail avulsion with chemical matrixectomy performed using [id number] applications of phenol followed by alcohol flush.  6. Light dressing applied. 7. Prescription for Gentamicin cream provided to patient to use daily with a bandage.  8. Return to clinic in 2 weeks.   Edrick Kins, DPM [facility]  Dr. Edrick Kins, [address]                                        [place], [place] [address]                Office [phone]  Fax [phone]

## 2019-05-17 ENCOUNTER — Other Ambulatory Visit: Payer: Self-pay

## 2019-05-17 ENCOUNTER — Ambulatory Visit: Payer: BC Managed Care – PPO

## 2019-05-17 DIAGNOSIS — L600 Ingrowing nail: Secondary | ICD-10-CM

## 2019-05-17 NOTE — Patient Instructions (Signed)

## 2019-05-22 NOTE — Progress Notes (Signed)
Patient is here today for follow-up appointment, recent procedure performed on 05/10/2019 removal of ingrown toenail left second toe.  He states that overall the area feels much better and is not having problems with it at this time.  No redness, no swelling, no erythema, no drainage, no other signs and symptoms of infection.  The area is scabbing over at this time and appears to be healing well.  Discussed signs and symptoms of infection, verbal and written instructions were given to the patient.  He is to follow-up as needed with any acute symptom changes.

## 2019-05-23 ENCOUNTER — Encounter: Payer: Self-pay | Admitting: Internal Medicine

## 2019-07-07 ENCOUNTER — Ambulatory Visit (INDEPENDENT_AMBULATORY_CARE_PROVIDER_SITE_OTHER): Payer: BC Managed Care – PPO | Admitting: Internal Medicine

## 2019-07-07 ENCOUNTER — Encounter: Payer: Self-pay | Admitting: Internal Medicine

## 2019-07-07 ENCOUNTER — Other Ambulatory Visit: Payer: Self-pay

## 2019-07-07 VITALS — BP 142/75 | HR 72 | Temp 98.1°F | Resp 16 | Ht 70.0 in | Wt 321.5 lb

## 2019-07-07 DIAGNOSIS — I10 Essential (primary) hypertension: Secondary | ICD-10-CM

## 2019-07-07 DIAGNOSIS — Z205 Contact with and (suspected) exposure to viral hepatitis: Secondary | ICD-10-CM

## 2019-07-07 DIAGNOSIS — E785 Hyperlipidemia, unspecified: Secondary | ICD-10-CM

## 2019-07-07 DIAGNOSIS — R739 Hyperglycemia, unspecified: Secondary | ICD-10-CM

## 2019-07-07 DIAGNOSIS — Z0000 Encounter for general adult medical examination without abnormal findings: Secondary | ICD-10-CM

## 2019-07-07 DIAGNOSIS — Z6841 Body Mass Index (BMI) 40.0 and over, adult: Secondary | ICD-10-CM

## 2019-07-07 DIAGNOSIS — E119 Type 2 diabetes mellitus without complications: Secondary | ICD-10-CM

## 2019-07-07 DIAGNOSIS — E66813 Obesity, class 3: Secondary | ICD-10-CM

## 2019-07-07 LAB — COMPREHENSIVE METABOLIC PANEL
ALT: 25 U/L (ref 0–53)
AST: 14 U/L (ref 0–37)
Albumin: 4.5 g/dL (ref 3.5–5.2)
Alkaline Phosphatase: 48 U/L (ref 39–117)
BUN: 18 mg/dL (ref 6–23)
CO2: 28 mEq/L (ref 19–32)
Calcium: 9.5 mg/dL (ref 8.4–10.5)
Chloride: 103 mEq/L (ref 96–112)
Creatinine, Ser: 0.84 mg/dL (ref 0.40–1.50)
GFR: 94.5 mL/min (ref >60.00)
Glucose, Bld: 129 mg/dL — ABNORMAL HIGH (ref 70–99)
Potassium: 4.1 mEq/L (ref 3.5–5.1)
Sodium: 139 mEq/L (ref 135–145)
Total Bilirubin: 0.8 mg/dL (ref 0.2–1.2)
Total Protein: 7.2 g/dL (ref 6.0–8.3)

## 2019-07-07 LAB — CBC WITH DIFFERENTIAL/PLATELET
Basophils Absolute: 0.1 10*3/uL (ref 0.0–0.1)
Basophils Relative: 1.5 % (ref 0.0–3.0)
Eosinophils Absolute: 0.2 10*3/uL (ref 0.0–0.7)
Eosinophils Relative: 3.8 % (ref 0.0–5.0)
HCT: 46.1 % (ref 39.0–52.0)
Hemoglobin: 15.3 g/dL (ref 13.0–17.0)
Lymphocytes Relative: 30.5 % (ref 12.0–46.0)
Lymphs Abs: 1.7 10*3/uL (ref 0.7–4.0)
MCHC: 33.1 g/dL (ref 30.0–36.0)
MCV: 86.1 fl (ref 78.0–100.0)
Monocytes Absolute: 0.5 10*3/uL (ref 0.1–1.0)
Monocytes Relative: 9.2 % (ref 3.0–12.0)
Neutro Abs: 3 10*3/uL (ref 1.4–7.7)
Neutrophils Relative %: 55 % (ref 43.0–77.0)
Platelets: 252 10*3/uL (ref 150.0–400.0)
RBC: 5.35 Mil/uL (ref 4.22–5.81)
RDW: 14.2 % (ref 11.5–15.5)
WBC: 5.5 10*3/uL (ref 4.0–10.5)

## 2019-07-07 LAB — PSA: PSA: 0.69 ng/mL (ref 0.10–4.00)

## 2019-07-07 LAB — LIPID PANEL
Cholesterol: 154 mg/dL (ref 0–200)
HDL: 40.8 mg/dL (ref >39.00)
LDL Cholesterol: 75 mg/dL (ref 0–99)
NonHDL: 113.13
Total CHOL/HDL Ratio: 4
Triglycerides: 191 mg/dL — ABNORMAL HIGH (ref 0.0–149.0)
VLDL: 38.2 mg/dL (ref 0.0–40.0)

## 2019-07-07 LAB — HEMOGLOBIN A1C: Hgb A1c MFr Bld: 6.2 % (ref 4.6–6.5)

## 2019-07-07 MED ORDER — METFORMIN HCL 500 MG PO TABS: 500.0000 mg | ORAL_TABLET | Freq: Two times a day (BID) | ORAL | 1 refills | Status: DC

## 2019-07-07 MED ORDER — HYDROCHLOROTHIAZIDE 25 MG PO TABS: 25.0000 mg | ORAL_TABLET | Freq: Every day | ORAL | 0 refills | Status: DC

## 2019-07-07 NOTE — Progress Notes (Signed)
Subjective:    Patient ID: Bryan [name], male    DOB: 1961-12-07, 58 y.o.   MRN: [id number]  DOS:  07/07/2019 Type of visit - description: CPX In general feeling well Extensive medication reconciliation.  Wt Readings from Last 3 Encounters:  07/07/19 (!) 321 lb 8 oz (145.8 kg)  01/24/19 (!) 318 lb (144.2 kg)  01/02/19 (!) 320 lb (145.2 kg)    BP Readings from Last 3 Encounters:  07/07/19 (!) 142/75  01/24/19 130/80  01/02/19 120/74     Review of Systems  Had some floaters, saw ophthalmology this week, diagnosed with a very small/early retinal detachment.  Other than above, a 14 point review of systems is negative     Past Medical History:  Diagnosis Date  . Dental crowns present   . Hyperlipidemia   . Lower back pain   . Nephrolithiasis 2017  . Prediabetes 09/04/2014  . Seasonal allergies   . Sebaceous cyst 04/2013   upper back  . Sleep apnea    uses CPAP nightly  . Urolithiasis 10/2016    Past Surgical History:  Procedure Laterality Date  . ADENOIDECTOMY     as a child  . CHOLECYSTECTOMY  01/12/2012   Procedure: LAPAROSCOPIC CHOLECYSTECTOMY WITH INTRAOPERATIVE CHOLANGIOGRAM;  Surgeon: [name], MD;  Location: [facility];  Service: General;  Laterality: N/A;  . CYST EXCISION     cyst removal from back  . EAR CYST EXCISION N/A 05/04/2013   Procedure: Excision subcutaneous mass posterior neck;  Surgeon: Imogene Burn. [name], MD;  Location: [place];  Service: General;  Laterality: N/A;    Social History   Socioeconomic History  . Marital status: Married    Spouse name: Patty  . Number of children: 3  . Years of education: Not on file  . Highest education level: Not on file  Occupational History  . Occupation:  Retail banker, A/C units     Employer: [employer]  Social Needs  . Financial resource strain: Not on file  . Food insecurity    Worry: Not on file    Inability: Not on file  . Transportation needs    Medical: Not  on file    Non-medical: Not on file  Tobacco Use  . Smoking status: Never Smoker  . Smokeless tobacco: Never Used  Substance and Sexual Activity  . Alcohol use: Yes    Comment: 1 glass wine every other day  . Drug use: No  . Sexual activity: Not on file  Lifestyle  . Physical activity    Days per week: Not on file    Minutes per session: Not on file  . Stress: Not on file  Relationships  . Social Herbalist on phone: Not on file    Gets together: Not on file    Attends religious service: Not on file    Active member of club or organization: Not on file    Attends meetings of clubs or organizations: Not on file    Relationship status: Not on file  . Intimate partner violence    Fear of current or ex partner: Not on file    Emotionally abused: Not on file    Physically abused: Not on file    Forced sexual activity: Not on file  Other Topics Concern  . Not on file  Social History Narrative   3 children, 1 at home,  2 in college    Wife w/ breast ca  dx 2016, doing well   she developed SZs 2017, sees Dr Jannifer Franklin             Allergies as of 07/07/2019      Reactions   Lisinopril Cough      Medication List       Accurate as of July 07, 2019 11:59 PM. If you have any questions, ask your nurse or doctor.        STOP taking these medications   Aspirin-Calcium Carbonate 81-777 MG Tabs Stopped by: Kathlene November, MD   buPROPion 150 MG 12 hr tablet Commonly known as: WELLBUTRIN SR Stopped by: Kathlene November, MD   gentamicin cream 0.1 % Commonly known as: GARAMYCIN Stopped by: Kathlene November, MD   HYDROcodone-homatropine 5-1.5 MG/5ML syrup Commonly known as: HYCODAN Stopped by: Kathlene November, MD   Insulin Pen Needle 32G X 4 MM Misc Stopped by: Kathlene November, MD   liraglutide 18 MG/3ML Sopn Commonly known as: [place] by: Kathlene November, MD   omega-3 acid ethyl esters 1 g capsule Commonly known as: LOVAZA Stopped by: Kathlene November, MD   Valerian Root 450 MG Caps Stopped by:  Kathlene November, MD   Vitamin D (Ergocalciferol) 1.25 MG (50000 UT) Caps capsule Commonly known as: DRISDOL Stopped by: Kathlene November, MD     TAKE these medications   aspirin 81 MG tablet Take 81 mg by mouth daily.   atorvastatin 20 MG tablet Commonly known as: LIPITOR Take 1 tablet (20 mg total) by mouth at bedtime.   azelastine 0.1 % nasal spray Commonly known as: ASTELIN Place 2 sprays into both nostrils at bedtime as needed for rhinitis. Use in each nostril as directed   CENTRUM PO Take 1 tablet by mouth daily.   CO ENZYME Q-10 PO Take by mouth daily.   fish oil-omega-3 fatty acids 1000 MG capsule Take 1 g by mouth daily.   hydrochlorothiazide 25 MG tablet Commonly known as: HYDRODIURIL Take 1 tablet (25 mg total) by mouth daily. What changed:   how much to take  how to take this  when to take this  additional instructions Changed by: Kathlene November, MD   Melatonin 1 MG Caps Take 1 mg by mouth at bedtime as needed.   metFORMIN 500 MG tablet Commonly known as: GLUCOPHAGE Take 1 tablet (500 mg total) by mouth 2 (two) times daily with a meal.      Family History  Problem Relation Age of Onset  . Cancer Mother        ovarian  . Hypertension Mother   . Hyperlipidemia Mother   . Obesity Mother   . Cancer Brother        leukemia  . Diabetes Brother   . Colon cancer Maternal Aunt 46  . Cancer Paternal Grandmother        breast  . Cancer Father 67       renal  . Kidney disease Father   . Sleep apnea Father   . Prostate cancer Neg Hx   . Esophageal cancer Neg Hx   . Rectal cancer Neg Hx   . Stomach cancer Neg Hx   . CAD Neg Hx         Objective:   Physical Exam BP (!) 142/75 (BP Location: Left Arm, Patient Position: Sitting, Cuff Size: Normal)   Pulse 72   Temp 98.1 F (36.7 C) (Oral)   Resp 16   Ht 5\' 10"  (1.778 m)   Wt (!) 321 lb 8  oz (145.8 kg)   SpO2 99%   BMI 46.13 kg/m  General: Well developed, NAD, BMI noted Neck: No  thyromegaly  HEENT:   Normocephalic . Face symmetric, atraumatic Lungs:  CTA B Normal respiratory effort, no intercostal retractions, no accessory muscle use. Heart: RRR,  no murmur.  No pretibial edema bilaterally  Abdomen:  Not distended, soft, non-tender. No rebound or rigidity.   Skin: Exposed areas without rash. Not pale. Not jaundice DRE: Normal sphincter tone, exam very limited due to BMI, very difficult to reach the prostate but it seems normal. Neurologic:  alert & oriented X3.  Speech normal, gait appropriate for age and unassisted Strength symmetric and appropriate for age.  Psych: Cognition and judgment appear intact.  Cooperative with normal attention span and concentration.  Behavior appropriate. No anxious or depressed appearing.     Assessment      Assessment Prediabetes Hyperlipidemia Insomnia: on OTCs Morbid obesity, BMI 46 OSA, CPAP Seasonal allergies Kidney stone (first) 10-2016 Vit D def dx 09/2017 Retina detachment DX 06-2019  PLAN: Prediabetes: Taking metformin consistently, RF sent,  check A1c Hyperlipidemia: On Lipitor, checking labs Morbid obesity: Recommend to go back to the wellness clinic.  Used to take Wellbutrin and Victoza. Wife was diagnosed with hepatitis A, patient request serology. BP slightly elevated today: Was taking HCTZ and feels that his BP was better while on it.  RF.  Restart, BMP in 1 month Vitamin D deficiency: Recommend 1000 units daily Serous otitis: Had a left tube placement 07/26/2018 by ENT, feels great but he still sees some discharge on and off.  Recommend to see ENT RTC labs 1 month RTC checkup 6 months

## 2019-07-07 NOTE — Patient Instructions (Addendum)
GO TO THE LAB : Get the blood work     GO TO THE FRONT DESK Schedule your next appointment   for a checkup in 6 months  Schedule labs to be done in 4 weeks (you are starting HCTZ)  Take metformin regularly  Take vitamin D 1000 units every day    Check the  blood pressure 2 or 3 times a month BP GOAL is between 110/65 and  135/85. If it is consistently higher or lower, let me know

## 2019-07-07 NOTE — Progress Notes (Signed)
Pre visit review using our clinic review tool, if applicable. No additional management support is needed unless otherwise documented below in the visit note. 

## 2019-07-09 NOTE — Assessment & Plan Note (Signed)
-  Td 2011; shingrex discussed, pro/cons of early Shingrix discussed, elected to wait. -CCS : +FH Mat. Aunt had colon Ca Cscope  aprox  12/07/2002,  normal . cscope 2015- polyps, GI already contacted pt  -Prostate ca screening : DRA limited, see clinical exam, check a PSA  - diet, exercise: Extensively discussed -Labs: BMP, CBC, CMP, A1c, hepatitis A serology, cholesterol, PSA.

## 2019-07-09 NOTE — Assessment & Plan Note (Signed)
Prediabetes: Taking metformin consistently, RF sent,  check A1c Hyperlipidemia: On Lipitor, checking labs Morbid obesity: Recommend to go back to the wellness clinic.  Used to take Wellbutrin and Victoza. Wife was diagnosed with hepatitis A, patient request serology. BP slightly elevated today: Was taking HCTZ and feels that his BP was better while on it.  RF.  Restart, BMP in 1 month Vitamin D deficiency: Recommend 1000 units daily Serous otitis: Had a left tube placement 07/26/2018 by ENT, feels great but he still sees some discharge on and off.  Recommend to see ENT RTC labs 1 month RTC checkup 6 months

## 2019-07-11 LAB — HEPATITIS A ANTIBODY, TOTAL: Hepatitis A AB,Total: NONREACTIVE

## 2019-08-04 ENCOUNTER — Other Ambulatory Visit: Payer: Self-pay

## 2019-08-04 ENCOUNTER — Other Ambulatory Visit (INDEPENDENT_AMBULATORY_CARE_PROVIDER_SITE_OTHER): Payer: BC Managed Care – PPO

## 2019-08-04 DIAGNOSIS — I10 Essential (primary) hypertension: Secondary | ICD-10-CM | POA: Diagnosis not present

## 2019-08-04 LAB — BASIC METABOLIC PANEL
BUN: 21 mg/dL (ref 6–23)
CO2: 28 mEq/L (ref 19–32)
Calcium: 9 mg/dL (ref 8.4–10.5)
Chloride: 104 mEq/L (ref 96–112)
Creatinine, Ser: 0.88 mg/dL (ref 0.40–1.50)
GFR: 89.53 mL/min (ref >60.00)
Glucose, Bld: 119 mg/dL — ABNORMAL HIGH (ref 70–99)
Potassium: 4.3 mEq/L (ref 3.5–5.1)
Sodium: 140 mEq/L (ref 135–145)

## 2019-09-30 ENCOUNTER — Other Ambulatory Visit: Payer: Self-pay | Admitting: Internal Medicine

## 2019-09-30 DIAGNOSIS — I10 Essential (primary) hypertension: Secondary | ICD-10-CM

## 2019-10-03 ENCOUNTER — Ambulatory Visit (INDEPENDENT_AMBULATORY_CARE_PROVIDER_SITE_OTHER): Payer: BC Managed Care – PPO | Admitting: Bariatrics

## 2019-10-03 ENCOUNTER — Encounter (INDEPENDENT_AMBULATORY_CARE_PROVIDER_SITE_OTHER): Payer: Self-pay | Admitting: Bariatrics

## 2019-10-03 ENCOUNTER — Other Ambulatory Visit: Payer: Self-pay

## 2019-10-03 VITALS — BP 134/82 | HR 80 | Temp 98.7°F | Ht 70.0 in | Wt 323.0 lb

## 2019-10-03 DIAGNOSIS — E119 Type 2 diabetes mellitus without complications: Secondary | ICD-10-CM | POA: Diagnosis not present

## 2019-10-03 DIAGNOSIS — Z9189 Other specified personal risk factors, not elsewhere classified: Secondary | ICD-10-CM

## 2019-10-03 DIAGNOSIS — I10 Essential (primary) hypertension: Secondary | ICD-10-CM

## 2019-10-03 DIAGNOSIS — Z6841 Body Mass Index (BMI) 40.0 and over, adult: Secondary | ICD-10-CM

## 2019-10-03 DIAGNOSIS — E559 Vitamin D deficiency, unspecified: Secondary | ICD-10-CM | POA: Diagnosis not present

## 2019-10-03 NOTE — Progress Notes (Signed)
Office: [phone]  /  Fax: [phone]   HPI:   Chief Complaint: OBESITY Bryan [name] is here to discuss his progress with his obesity treatment plan. He is on the Category 3 plan and journaling 350-500 calories and 40 grams of protein at breakfast and is following his eating plan approximately 40% of the time. He states he is walking 45-60 minutes 5 times per week. Bryan [name] is a patient of [name] and this is his first visit with me. His last visit in the office was 01/24/2019. He is doing well with his water intake. He had been on Victoza, but stopped and does not want to restart.  His weight is (!) 323 lb (146.5 kg) today and has had a weight gain of 5 lbs since his last visit. He has lost 3 lbs since starting treatment with [place].  Diabetes II (Stable) Bryan [name] has a diagnosis of diabetes type II and is taking metformin. Bryan [name] does not report checking his blood sugars but denies any low readings. He states his appetite is appropriate. Last A1c was 6.2 on 07/07/2019 with an insulin of 21.9 on 11/17/2018. He has been working on intensive lifestyle modifications including diet, exercise, and weight loss to help control his blood glucose levels.  Hypertension Bryan [name] is a 59 y.o. male with hypertension.  Bryan [name] denies chest pain or shortness of breath on exertion. He is working weight loss to help control his blood pressure with the goal of decreasing his risk of heart attack and stroke. Bryan [name]'s blood pressure is reasonably well controlled.  Vitamin D deficiency Bryan [name] has a diagnosis of Vitamin D deficiency. He is currently taking OTC Vit D and denies nausea, vomiting or muscle weakness.  At risk for osteopenia and osteoporosis Bryan [name] is at higher risk of osteopenia and osteoporosis due to Vitamin D deficiency.   ASSESSMENT AND PLAN:  Type 2 diabetes mellitus without complication, without long-term current use of insulin (HCC)  Essential hypertension  At risk for  osteoporosis  Vitamin D deficiency - Plan: VITAMIN D 25 Hydroxy (Vit-D Deficiency, Fractures)  Class 3 severe obesity with serious comorbidity and body mass index (BMI) of 45.0 to 49.9 in adult, unspecified obesity type ([place])  PLAN:  Diabetes II (Stable) Bryan [name] has been given extensive diabetes education by myself today including ideal fasting and post-prandial blood glucose readings, individual ideal HgA1c goals  and hypoglycemia prevention. We discussed the importance of good blood sugar control to decrease the likelihood of diabetic complications such as nephropathy, neuropathy, limb loss, blindness, coronary artery disease, and death. We discussed the importance of intensive lifestyle modification including diet, exercise and weight loss as the first line treatment for diabetes. Daily agrees to continue his diabetes medications and will follow-up at the agreed upon time.  Hypertension We discussed sodium restriction, working on healthy weight loss, and a regular exercise program as the means to achieve improved blood pressure control. Bryan [name] agreed with this plan and agreed to follow up as directed. We will continue to monitor his blood pressure as well as his progress with the above lifestyle modifications. He will continue his medications as prescribed and will watch for signs of hypotension as he continues his lifestyle modifications.  Vitamin D Deficiency Bryan [name] was informed that low Vitamin D levels contributes to fatigue and are associated with obesity, breast, and colon cancer. He will have routine testing of Vitamin D and follow-up with our clinic in 2-3 weeks.  At risk for osteopenia and osteoporosis Bryan [name] was  given extended  (15 minutes) osteoporosis prevention counseling today. Bryan [name] is at risk for osteopenia and osteoporosis due to his Vitamin D deficiency. He was encouraged to take his Vitamin D and follow his higher calcium diet and increase strengthening exercise to  help strengthen his bones and decrease his risk of osteopenia and osteoporosis.  Obesity Bryan [name] is currently in the action stage of change. As such, his goal is to continue with weight loss efforts. He has agreed to follow the Category 3 plan. Bryan [name] will take his lunch to work, will eat out less, and will increase his protein. Bryan [name] has been instructed to work up to a goal of 150 minutes of combined cardio and strengthening exercise per week for weight loss and overall health benefits. We discussed the following Behavioral Modification Strategies today: increasing lean protein intake, decreasing simple carbohydrates, increasing vegetables, increase H20 intake, decrease eating out, no skipping meals, work on meal planning and easy cooking plans, keeping healthy foods in the home, and planning for success.  Bryan [name] has agreed to follow-up with our clinic in 2-3 weeks. He was informed of the importance of frequent follow-up visits to maximize his success with intensive lifestyle modifications for his multiple health conditions.  ALLERGIES: Allergies  Allergen Reactions  . Lisinopril Cough    MEDICATIONS: Current Outpatient Medications on File Prior to Visit  Medication Sig Dispense Refill  . aspirin 81 MG tablet Take 81 mg by mouth daily.    [name] atorvastatin (LIPITOR) 20 MG tablet Take 1 tablet (20 mg total) by mouth at bedtime. 90 tablet 1  . azelastine (ASTELIN) 0.1 % nasal spray Place 2 sprays into both nostrils at bedtime as needed for rhinitis. Use in each nostril as directed 30 mL 5  . CO ENZYME Q-10 PO Take by mouth daily.    . fish oil-omega-3 fatty acids 1000 MG capsule Take 1 g by mouth daily.    . hydrochlorothiazide (HYDRODIURIL) 25 MG tablet Take 1 tablet (25 mg total) by mouth daily. 90 tablet 1  . Melatonin 1 MG CAPS Take 1 mg by mouth at bedtime as needed.     . metFORMIN (GLUCOPHAGE) 500 MG tablet Take 1 tablet (500 mg total) by mouth 2 (two) times daily with a meal. 180  tablet 1  . Multiple Vitamins-Minerals (CENTRUM PO) Take 1 tablet by mouth daily.     No current facility-administered medications on file prior to visit.     PAST MEDICAL HISTORY: Past Medical History:  Diagnosis Date  . Dental crowns present   . Hyperlipidemia   . Lower back pain   . Nephrolithiasis 2017  . Prediabetes 09/04/2014  . Seasonal allergies   . Sebaceous cyst 04/2013   upper back  . Sleep apnea    uses CPAP nightly  . Urolithiasis 10/2016    PAST SURGICAL HISTORY: Past Surgical History:  Procedure Laterality Date  . ADENOIDECTOMY     as a child  . CHOLECYSTECTOMY  01/12/2012   Procedure: LAPAROSCOPIC CHOLECYSTECTOMY WITH INTRAOPERATIVE CHOLANGIOGRAM;  Surgeon: [name], MD;  Location: [facility];  Service: General;  Laterality: N/A;  . CYST EXCISION     cyst removal from back  . EAR CYST EXCISION N/A 05/04/2013   Procedure: Excision subcutaneous mass posterior neck;  Surgeon: Imogene Burn. [name], MD;  Location: [place];  Service: General;  Laterality: N/A;    SOCIAL HISTORY: Social History   Tobacco Use  . Smoking status: Never Smoker  . Smokeless tobacco: Never  Used  Substance Use Topics  . Alcohol use: Yes    Comment: 1 glass wine every other day  . Drug use: No    FAMILY HISTORY: Family History  Problem Relation Age of Onset  . Cancer Mother        ovarian  . Hypertension Mother   . Hyperlipidemia Mother   . Obesity Mother   . Cancer Brother        leukemia  . Diabetes Brother   . Colon cancer Maternal Aunt 70  . Cancer Paternal Grandmother        breast  . Cancer Father 33       renal  . Kidney disease Father   . Sleep apnea Father   . Prostate cancer Neg Hx   . Esophageal cancer Neg Hx   . Rectal cancer Neg Hx   . Stomach cancer Neg Hx   . CAD Neg Hx    ROS: Review of Systems  Respiratory: Negative for shortness of breath.   Cardiovascular: Negative for chest pain.  Gastrointestinal: Negative for nausea and  vomiting.  Musculoskeletal:       Negative for muscle weakness.   PHYSICAL EXAM: Blood pressure 134/82, pulse 80, temperature 98.7 F (37.1 C), height 5\' 10"  (1.778 m), weight (!) 323 lb (146.5 kg), SpO2 96 %. Body mass index is 46.35 kg/m. Physical Exam Vitals signs reviewed.  Constitutional:      Appearance: Normal appearance. He is obese.  Cardiovascular:     Rate and Rhythm: Normal rate.     Pulses: Normal pulses.  Pulmonary:     Effort: Pulmonary effort is normal.     Breath sounds: Normal breath sounds.  Musculoskeletal: Normal range of motion.  Skin:    General: Skin is warm and dry.  Neurological:     Mental Status: He is alert and oriented to person, place, and time.  Psychiatric:        Behavior: Behavior normal.   RECENT LABS AND TESTS: BMET    Component Value Date/Time   NA 140 08/04/2019 0719   NA 144 11/17/2018 0000   K 4.3 08/04/2019 0719   CL 104 08/04/2019 0719   CO2 28 08/04/2019 0719   GLUCOSE 119 (H) 08/04/2019 0719   BUN 21 08/04/2019 0719   BUN 14 11/17/2018 0000   CREATININE 0.88 08/04/2019 0719   CALCIUM 9.0 08/04/2019 0719   GFRNONAA 93 11/17/2018 0000   GFRAA 108 11/17/2018 0000   Lab Results  Component Value Date   HGBA1C 6.2 07/07/2019   HGBA1C 5.9 (H) 11/17/2018   HGBA1C 5.9 (H) 08/18/2018   HGBA1C 5.9 (H) 02/17/2018   HGBA1C 5.9 (H) 01/05/2018   Lab Results  Component Value Date   INSULIN 21.9 11/17/2018   INSULIN 24.0 08/18/2018   INSULIN 33.0 (H) 02/17/2018   INSULIN 19.4 01/05/2018   INSULIN 21.9 09/08/2017   CBC    Component Value Date/Time   WBC 5.5 07/07/2019 0835   RBC 5.35 07/07/2019 0835   HGB 15.3 07/07/2019 0835   HGB 14.0 08/18/2018 0830   HCT 46.1 07/07/2019 0835   HCT 42.7 08/18/2018 0830   PLT 252.0 07/07/2019 0835   MCV 86.1 07/07/2019 0835   MCV 86 08/18/2018 0830   MCH 28.3 08/18/2018 0830   MCH 28.8 01/13/2012 0328   MCHC 33.1 07/07/2019 0835   RDW 14.2 07/07/2019 0835   RDW 13.2 08/18/2018  0830   LYMPHSABS 1.7 07/07/2019 0835   LYMPHSABS 1.5 08/18/2018 0830  MONOABS 0.5 07/07/2019 0835   EOSABS 0.2 07/07/2019 0835   EOSABS 0.2 08/18/2018 0830   BASOSABS 0.1 07/07/2019 0835   BASOSABS 0.1 08/18/2018 0830   Iron/TIBC/Ferritin/ %Sat No results found for: IRON, TIBC, FERRITIN, IRONPCTSAT Lipid Panel     Component Value Date/Time   CHOL 154 07/07/2019 0835   CHOL 144 11/17/2018 0000   TRIG 191.0 (H) 07/07/2019 0835   HDL 40.80 07/07/2019 0835   HDL 43 11/17/2018 0000   CHOLHDL 4 07/07/2019 0835   VLDL 38.2 07/07/2019 0835   LDLCALC 75 07/07/2019 0835   LDLCALC 76 11/17/2018 0000   LDLDIRECT 136.0 11/16/2016 0902   Hepatic Function Panel     Component Value Date/Time   PROT 7.2 07/07/2019 0835   PROT 7.2 11/17/2018 0000   ALBUMIN 4.5 07/07/2019 0835   ALBUMIN 4.6 11/17/2018 0000   AST 14 07/07/2019 0835   ALT 25 07/07/2019 0835   ALKPHOS 48 07/07/2019 0835   BILITOT 0.8 07/07/2019 0835   BILITOT 0.4 11/17/2018 0000   BILIDIR 0.0 02/26/2012 1353      Component Value Date/Time   TSH 1.810 08/18/2018 0830   TSH 1.400 09/08/2017 1144   TSH 1.31 06/23/2017 1028   Results for Gal[place], Bryan B "[place]" (MRN [id number]) as of 10/03/2019 16:01  Ref. Range 11/17/2018 00:00  Vitamin D, 25-Hydroxy Latest Ref Range: 30.0 - 100.0 ng/mL 49.3   OBESITY BEHAVIORAL INTERVENTION VISIT  Today's visit was #29  Starting weight: 326 lbs Starting date: 09/08/2017 Today's weight: 323 lbs  Today's date: 10/03/2019 Total lbs lost to date: 3    10/03/2019  Height 5\' 10"  (1.778 m)  Weight 323 lb (146.5 kg) (A)  BMI (Calculated) 46.35  BLOOD PRESSURE - SYSTOLIC [id number]  BLOOD PRESSURE - DIASTOLIC 82   Body Fat % 0000000 %  Total Body Water (lbs) 137.6 lbs   ASK: We discussed the diagnosis of obesity with Bryan [name] today and Jacqueline agreed to give [place] permission to discuss obesity behavioral modification therapy today.  ASSESS: Kaream has the diagnosis of obesity and  his BMI today is 46.4. Asahd is in the action stage of change.   ADVISE: Shadrach was educated on the multiple health risks of obesity as well as the benefit of weight loss to improve his health. He was advised of the need for long term treatment and the importance of lifestyle modifications to improve his current health and to decrease his risk of future health problems.  AGREE: Multiple dietary modification options and treatment options were discussed and  Kadon agreed to follow the recommendations documented in the above note.  ARRANGE: Weaver was educated on the importance of frequent visits to treat obesity as outlined per CMS and USPSTF guidelines and agreed to schedule his next follow up appointment today.  Migdalia Dk, am acting as Location manager for [employer], DO   I have reviewed the above documentation for accuracy and completeness, and I agree with the above. -Jearld Lesch, DO

## 2019-10-04 LAB — VITAMIN D 25 HYDROXY (VIT D DEFICIENCY, FRACTURES): Vit D, 25-Hydroxy: 27 ng/mL — ABNORMAL LOW (ref 30.0–100.0)

## 2019-10-26 ENCOUNTER — Encounter (INDEPENDENT_AMBULATORY_CARE_PROVIDER_SITE_OTHER): Payer: Self-pay | Admitting: Family Medicine

## 2019-10-26 ENCOUNTER — Ambulatory Visit (INDEPENDENT_AMBULATORY_CARE_PROVIDER_SITE_OTHER): Payer: BC Managed Care – PPO | Admitting: Family Medicine

## 2019-10-26 ENCOUNTER — Other Ambulatory Visit: Payer: Self-pay

## 2019-10-26 VITALS — BP 148/76 | HR 91 | Temp 98.3°F | Ht 70.0 in | Wt 322.0 lb

## 2019-10-26 DIAGNOSIS — I10 Essential (primary) hypertension: Secondary | ICD-10-CM | POA: Diagnosis not present

## 2019-10-26 DIAGNOSIS — Z9189 Other specified personal risk factors, not elsewhere classified: Secondary | ICD-10-CM | POA: Diagnosis not present

## 2019-10-26 DIAGNOSIS — Z6841 Body Mass Index (BMI) 40.0 and over, adult: Secondary | ICD-10-CM

## 2019-10-26 DIAGNOSIS — E559 Vitamin D deficiency, unspecified: Secondary | ICD-10-CM | POA: Diagnosis not present

## 2019-10-26 MED ORDER — VITAMIN D (ERGOCALCIFEROL) 1.25 MG (50000 UNIT) PO CAPS: 50000.0000 [IU] | ORAL_CAPSULE | ORAL | 0 refills | Status: DC

## 2019-10-31 NOTE — Progress Notes (Signed)
Office: [phone]  /  Fax: [phone]   HPI:   Chief Complaint: OBESITY Bryan [name] is here to discuss his progress with his obesity treatment plan. He is on the Category 3 plan and is following his eating plan approximately 40-50 % of the time. He states he is walking for 45-60 minutes 3 times per week. Bryan [name] has been working on weight loss, but he has deviated more in the last month. He has concerns about Thanksgiving eating strategies.  His weight is (!) 322 lb (146.1 kg) today and has had a weight loss of 1 pound over a period of 3 to 4 weeks since his last visit. He has lost 4 lbs since starting treatment with [place].  Hypertension Bryan [name] is a 58 y.o. male with hypertension. Bryan [name]'s blood pressure is normally well controlled, but it is elevated today. He feels it is due to stress. He denies chest pain. He is working on weight loss to help control his blood pressure with the goal of decreasing his risk of heart attack and stroke.   At risk for cardiovascular disease Bryan [name] is at a higher than average risk for cardiovascular disease due to obesity and hypertension. He currently denies any chest pain.  Vitamin D Deficiency Bryan [name] has a diagnosis of vitamin D deficiency. His Vit D level has worsened, and he notes increased fatigue. He is no longer on Vit D prescription, and his level is not controlled on OTC Vit D. He denies nausea, vomiting or muscle weakness.  ASSESSMENT AND PLAN:  Essential hypertension  Vitamin D deficiency - Plan: Vitamin D, Ergocalciferol, (DRISDOL) 1.25 MG (50000 UT) CAPS capsule  At risk for heart disease  Class 3 severe obesity with serious comorbidity and body mass index (BMI) of 45.0 to 49.9 in adult, unspecified obesity type (Bryan [name])  PLAN:  Hypertension We discussed sodium restriction, working on healthy weight loss, and a regular exercise program as the means to achieve improved blood pressure control. Bryan [name] agreed with this plan and  agreed to follow up as directed. We will continue to monitor his blood pressure as well as his progress with the above lifestyle modifications. Bryan [name] agrees to continue taking hydrochlorothiazide and will watch for signs of hypotension as he continues his lifestyle modifications. We will recheck labs in 3 months. Bryan [name] agrees to follow up with our clinic in 3 weeks.  Cardiovascular risk counseling Bryan [name] was given extended (15 minutes) coronary artery disease prevention counseling today. He is 58 y.o. male and has risk factors for heart disease including obesity and hypertension. We discussed intensive lifestyle modifications today with an emphasis on specific weight loss instructions and strategies. Pt was also informed of the importance of increasing exercise and decreasing saturated fats to help prevent heart disease.  Vitamin D Deficiency Bryan [name] was informed that low vitamin D levels contributes to fatigue and are associated with obesity, breast, and colon cancer. Bryan [name] agrees to restart prescription Vit D 50,000 IU every week #4 with no refills. He will follow up for routine testing of vitamin D, at least 2-3 times per year. He was informed of the risk of over-replacement of vitamin D and agrees to not increase his dose unless he discusses this with [place] first. Bryan [name] agrees to follow up with our clinic in 3 weeks.  Obesity Bryan [name] is currently in the action stage of change. As such, his goal is to continue with weight loss efforts He has agreed to follow the Category 3 plan Bryan [name] has been instructed to  work up to a goal of 150 minutes of combined cardio and strengthening exercise per week for weight loss and overall health benefits. We discussed the following Behavioral Modification Strategies today: increase H20 intake and holiday eating strategies    Bryan [name] has agreed to follow up with our clinic in 3 weeks. He was informed of the importance of frequent follow up visits to maximize  his success with intensive lifestyle modifications for his multiple health conditions.  ALLERGIES: Allergies  Allergen Reactions   Lisinopril Cough    MEDICATIONS: Current Outpatient Medications on File Prior to Visit  Medication Sig Dispense Refill   aspirin 81 MG tablet Take 81 mg by mouth daily.     atorvastatin (LIPITOR) 20 MG tablet Take 1 tablet (20 mg total) by mouth at bedtime. 90 tablet 1   azelastine (ASTELIN) 0.1 % nasal spray Place 2 sprays into both nostrils at bedtime as needed for rhinitis. Use in each nostril as directed 30 mL 5   Cholecalciferol (VITAMIN D) 50 MCG (2000 UT) CAPS Take 1 capsule by mouth daily.     CO ENZYME Q-10 PO Take by mouth daily.     fish oil-omega-3 fatty acids 1000 MG capsule Take 1 g by mouth daily.     hydrochlorothiazide (HYDRODIURIL) 25 MG tablet Take 1 tablet (25 mg total) by mouth daily. 90 tablet 1   Melatonin 1 MG CAPS Take 1 mg by mouth at bedtime as needed.      metFORMIN (GLUCOPHAGE) 500 MG tablet Take 1 tablet (500 mg total) by mouth 2 (two) times daily with a meal. 180 tablet 1   Multiple Vitamins-Minerals (CENTRUM PO) Take 1 tablet by mouth daily.     No current facility-administered medications on file prior to visit.     PAST MEDICAL HISTORY: Past Medical History:  Diagnosis Date   Dental crowns present    Hyperlipidemia    Lower back pain    Nephrolithiasis 2017   Prediabetes 09/04/2014   Seasonal allergies    Sebaceous cyst 04/2013   upper back   Sleep apnea    uses CPAP nightly   Urolithiasis 10/2016    PAST SURGICAL HISTORY: Past Surgical History:  Procedure Laterality Date   ADENOIDECTOMY     as a child   CHOLECYSTECTOMY  01/12/2012   Procedure: LAPAROSCOPIC CHOLECYSTECTOMY WITH INTRAOPERATIVE CHOLANGIOGRAM;  Surgeon: [name], MD;  Location: [facility];  Service: General;  Laterality: N/A;   CYST EXCISION     cyst removal from back   EAR CYST EXCISION N/A 05/04/2013   Procedure:  Excision subcutaneous mass posterior neck;  Surgeon: Imogene Burn. [name], MD;  Location: [place];  Service: General;  Laterality: N/A;    SOCIAL HISTORY: Social History   Tobacco Use   Smoking status: Never Smoker   Smokeless tobacco: Never Used  Substance Use Topics   Alcohol use: Yes    Comment: 1 glass wine every other day   Drug use: No    FAMILY HISTORY: Family History  Problem Relation Age of Onset   Cancer Mother        ovarian   Hypertension Mother    Hyperlipidemia Mother    Obesity Mother    Cancer Brother        leukemia   Diabetes Brother    Colon cancer Maternal Aunt 28   Cancer Paternal Grandmother        breast   Cancer Father 76  renal   Kidney disease Father    Sleep apnea Father    Prostate cancer Neg Hx    Esophageal cancer Neg Hx    Rectal cancer Neg Hx    Stomach cancer Neg Hx    CAD Neg Hx     ROS: Review of Systems  Constitutional: Positive for malaise/fatigue and weight loss.  Cardiovascular: Negative for chest pain.  Gastrointestinal: Negative for nausea and vomiting.  Musculoskeletal:       Negative muscle weakness    PHYSICAL EXAM: Blood pressure (!) 148/76, pulse 91, temperature 98.3 F (36.8 C), temperature source Oral, height 5\' 10"  (1.778 m), weight (!) 322 lb (146.1 kg), SpO2 97 %. Body mass index is 46.2 kg/m. Physical Exam Vitals signs reviewed.  Constitutional:      Appearance: Normal appearance. He is obese.  Cardiovascular:     Rate and Rhythm: Normal rate.     Pulses: Normal pulses.  Pulmonary:     Effort: Pulmonary effort is normal.     Breath sounds: Normal breath sounds.  Musculoskeletal: Normal range of motion.  Skin:    General: Skin is warm and dry.  Neurological:     Mental Status: He is alert and oriented to person, place, and time.  Psychiatric:        Mood and Affect: Mood normal.        Behavior: Behavior normal.     RECENT LABS AND TESTS: BMET      Component Value Date/Time   NA 140 08/04/2019 0719   NA 144 11/17/2018 0000   K 4.3 08/04/2019 0719   CL 104 08/04/2019 0719   CO2 28 08/04/2019 0719   GLUCOSE 119 (H) 08/04/2019 0719   BUN 21 08/04/2019 0719   BUN 14 11/17/2018 0000   CREATININE 0.88 08/04/2019 0719   CALCIUM 9.0 08/04/2019 0719   GFRNONAA 93 11/17/2018 0000   GFRAA 108 11/17/2018 0000   Lab Results  Component Value Date   HGBA1C 6.2 07/07/2019   HGBA1C 5.9 (H) 11/17/2018   HGBA1C 5.9 (H) 08/18/2018   HGBA1C 5.9 (H) 02/17/2018   HGBA1C 5.9 (H) 01/05/2018   Lab Results  Component Value Date   INSULIN 21.9 11/17/2018   INSULIN 24.0 08/18/2018   INSULIN 33.0 (H) 02/17/2018   INSULIN 19.4 01/05/2018   INSULIN 21.9 09/08/2017   CBC    Component Value Date/Time   WBC 5.5 07/07/2019 0835   RBC 5.35 07/07/2019 0835   HGB 15.3 07/07/2019 0835   HGB 14.0 08/18/2018 0830   HCT 46.1 07/07/2019 0835   HCT 42.7 08/18/2018 0830   PLT 252.0 07/07/2019 0835   MCV 86.1 07/07/2019 0835   MCV 86 08/18/2018 0830   MCH 28.3 08/18/2018 0830   MCH 28.8 01/13/2012 0328   MCHC 33.1 07/07/2019 0835   RDW 14.2 07/07/2019 0835   RDW 13.2 08/18/2018 0830   LYMPHSABS 1.7 07/07/2019 0835   LYMPHSABS 1.5 08/18/2018 0830   MONOABS 0.5 07/07/2019 0835   EOSABS 0.2 07/07/2019 0835   EOSABS 0.2 08/18/2018 0830   BASOSABS 0.1 07/07/2019 0835   BASOSABS 0.1 08/18/2018 0830   Iron/TIBC/Ferritin/ %Sat No results found for: IRON, TIBC, FERRITIN, IRONPCTSAT Lipid Panel     Component Value Date/Time   CHOL 154 07/07/2019 0835   CHOL 144 11/17/2018 0000   TRIG 191.0 (H) 07/07/2019 0835   HDL 40.80 07/07/2019 0835   HDL 43 11/17/2018 0000   CHOLHDL 4 07/07/2019 0835   VLDL 38.2 07/07/2019 0835  LDLCALC 75 07/07/2019 0835   LDLCALC 76 11/17/2018 0000   LDLDIRECT 136.0 11/16/2016 0902   Hepatic Function Panel     Component Value Date/Time   PROT 7.2 07/07/2019 0835   PROT 7.2 11/17/2018 0000   ALBUMIN 4.5 07/07/2019  0835   ALBUMIN 4.6 11/17/2018 0000   AST 14 07/07/2019 0835   ALT 25 07/07/2019 0835   ALKPHOS 48 07/07/2019 0835   BILITOT 0.8 07/07/2019 0835   BILITOT 0.4 11/17/2018 0000   BILIDIR 0.0 02/26/2012 1353      Component Value Date/Time   TSH 1.810 08/18/2018 0830   TSH 1.400 09/08/2017 1144   TSH 1.31 06/23/2017 1028      OBESITY BEHAVIORAL INTERVENTION VISIT  Today's visit was # 30   Starting weight: 326 lbs Starting date: 09/08/17 Today's weight : 322 lbs Today's date: 10/26/2019 Total lbs lost to date: 4    ASK: We discussed the diagnosis of obesity with [name] today and Kostantinos agreed to give [place] permission to discuss obesity behavioral modification therapy today.  ASSESS: Fatih has the diagnosis of obesity and his BMI today is 46.2 Holton is in the action stage of change   ADVISE: Demarquez was educated on the multiple health risks of obesity as well as the benefit of weight loss to improve his health. He was advised of the need for long term treatment and the importance of lifestyle modifications to improve his current health and to decrease his risk of future health problems.  AGREE: Multiple dietary modification options and treatment options were discussed and  Layne agreed to follow the recommendations documented in the above note.  ARRANGE: Kia was educated on the importance of frequent visits to treat obesity as outlined per CMS and USPSTF guidelines and agreed to schedule his next follow up appointment today.  I, Trixie Dredge, am acting as transcriptionist for Dennard Nip, MD  I have reviewed the above documentation for accuracy and completeness, and I agree with the above. -Dennard Nip, MD

## 2019-11-16 ENCOUNTER — Other Ambulatory Visit: Payer: Self-pay

## 2019-11-16 ENCOUNTER — Encounter (INDEPENDENT_AMBULATORY_CARE_PROVIDER_SITE_OTHER): Payer: Self-pay | Admitting: Family Medicine

## 2019-11-16 ENCOUNTER — Telehealth (INDEPENDENT_AMBULATORY_CARE_PROVIDER_SITE_OTHER): Payer: BC Managed Care – PPO | Admitting: Family Medicine

## 2019-11-16 DIAGNOSIS — E559 Vitamin D deficiency, unspecified: Secondary | ICD-10-CM | POA: Diagnosis not present

## 2019-11-16 DIAGNOSIS — Z6841 Body Mass Index (BMI) 40.0 and over, adult: Secondary | ICD-10-CM | POA: Diagnosis not present

## 2019-11-16 DIAGNOSIS — Z9189 Other specified personal risk factors, not elsewhere classified: Secondary | ICD-10-CM | POA: Diagnosis not present

## 2019-11-16 MED ORDER — VITAMIN D (ERGOCALCIFEROL) 1.25 MG (50000 UNIT) PO CAPS: 50000.0000 [IU] | ORAL_CAPSULE | ORAL | 0 refills | Status: DC

## 2019-11-20 NOTE — Progress Notes (Signed)
Office: [phone]  /  Fax: [phone] TeleHealth Visit:  Bryan [name] has verbally consented to this TeleHealth visit today. The patient is located at home, the provider is located at the [facility] office. The participants in this visit include the listed provider and patient. We were unable to use realtime audiovisual technology today and the telehealth visit was conducted via telephone.    HPI:  Chief Complaint: OBESITY Bryan [name] is here to discuss his progress with his obesity treatment plan. He is on the Category 3 plan and states he is following his eating plan approximately 50 % of the time. He states he is walking for 45 minutes 3 times per week.  [name] feels he is doing well maintaining his weight even over Thanksgiving. He has increase his water intake and his hunger is controlled, but he feels a bit bloated at times.  Vitamin D Deficiency [name] has a diagnosis of vitamin D deficiency. I discussed lab results with the patient. His Vit D level had dropped on OTC Vit D, so he is back on prescription Vit D and is tolerating it well.    ASSESSMENT AND PLAN:  Vitamin D deficiency - Plan: Vitamin D, Ergocalciferol, (DRISDOL) 1.25 MG (50000 UT) CAPS capsule  At risk for osteoporosis  Class 3 severe obesity with serious comorbidity and body mass index (BMI) of 45.0 to 49.9 in adult, unspecified obesity type ([facility])  PLAN:  Vitamin D Deficiency Low vitamin D level contributes to fatigue and are associated with obesity, breast, and colon cancer. [name] agrees to continue taking prescription Vit D 50,000 IU every week #4 and we will refill for 1 month. He will follow up for routine testing of vitamin D, at least 2-3 times per year to avoid over-replacement. We will recheck labs in 1-2 months. [name] agrees to follow up with our clinic in 3 weeks.  Obesity Syre is currently in the action stage of change. As such, his goal is to continue with weight loss  efforts He has agreed to follow the Category 3 plan [name] has been instructed to work up to a goal of 150 minutes of combined cardio and strengthening exercise per week for weight loss and overall health benefits. We discussed the following Behavioral Modification Strategies today: decreasing simple carbohydrates , decreasing sodium intake and holiday eating strategies  [name] was informed that it is ok to weigh himself at home 1-2 times per week.  [name] has agreed to follow up with our clinic in 3 weeks with myself. He was informed of the importance of frequent follow up visits to maximize his success with intensive lifestyle modifications for his multiple health conditions.  ALLERGIES: Allergies  Allergen Reactions  . Lisinopril Cough    MEDICATIONS: Current Outpatient Medications on File Prior to Visit  Medication Sig Dispense Refill  . aspirin 81 MG tablet Take 81 mg by mouth daily.    [name] atorvastatin (LIPITOR) 20 MG tablet Take 1 tablet (20 mg total) by mouth at bedtime. 90 tablet 1  . azelastine (ASTELIN) 0.1 % nasal spray Place 2 sprays into both nostrils at bedtime as needed for rhinitis. Use in each nostril as directed 30 mL 5  . Cholecalciferol (VITAMIN D) 50 MCG (2000 UT) CAPS Take 1 capsule by mouth daily.    . CO ENZYME Q-10 PO Take by mouth daily.    . fish oil-omega-3 fatty acids 1000 MG capsule Take 1 g by mouth daily.    . hydrochlorothiazide (HYDRODIURIL) 25  MG tablet Take 1 tablet (25 mg total) by mouth daily. 90 tablet 1  . Melatonin 1 MG CAPS Take 1 mg by mouth at bedtime as needed.     . metFORMIN (GLUCOPHAGE) 500 MG tablet Take 1 tablet (500 mg total) by mouth 2 (two) times daily with a meal. 180 tablet 1  . Multiple Vitamins-Minerals (CENTRUM PO) Take 1 tablet by mouth daily.     No current facility-administered medications on file prior to visit.    PAST MEDICAL HISTORY: Past Medical History:  Diagnosis Date  . Dental crowns present   . Hyperlipidemia     . Lower back pain   . Nephrolithiasis 2017  . Prediabetes 09/04/2014  . Seasonal allergies   . Sebaceous cyst 04/2013   upper back  . Sleep apnea    uses CPAP nightly  . Urolithiasis 10/2016    PAST SURGICAL HISTORY: Past Surgical History:  Procedure Laterality Date  . ADENOIDECTOMY     as a child  . CHOLECYSTECTOMY  01/12/2012   Procedure: LAPAROSCOPIC CHOLECYSTECTOMY WITH INTRAOPERATIVE CHOLANGIOGRAM;  Surgeon: [name], MD;  Location: [facility];  Service: General;  Laterality: N/A;  . CYST EXCISION     cyst removal from back  . EAR CYST EXCISION N/A 05/04/2013   Procedure: Excision subcutaneous mass posterior neck;  Surgeon: Imogene Burn. [name], MD;  Location: [place];  Service: General;  Laterality: N/A;    SOCIAL HISTORY: Social History   Tobacco Use  . Smoking status: Never Smoker  . Smokeless tobacco: Never Used  Substance Use Topics  . Alcohol use: Yes    Comment: 1 glass wine every other day  . Drug use: No    FAMILY HISTORY: Family History  Problem Relation Age of Onset  . Cancer Mother        ovarian  . Hypertension Mother   . Hyperlipidemia Mother   . Obesity Mother   . Cancer Brother        leukemia  . Diabetes Brother   . Colon cancer Maternal Aunt 38  . Cancer Paternal Grandmother        breast  . Cancer Father 16       renal  . Kidney disease Father   . Sleep apnea Father   . Prostate cancer Neg Hx   . Esophageal cancer Neg Hx   . Rectal cancer Neg Hx   . Stomach cancer Neg Hx   . CAD Neg Hx     ROS: Review of Systems  Constitutional: Negative for weight loss.    PHYSICAL EXAM: There were no vitals taken for this visit. There is no height or weight on file to calculate BMI. Physical Exam Vitals reviewed.  Constitutional:      Appearance: Normal appearance. He is obese.  Cardiovascular:     Rate and Rhythm: Normal rate.     Pulses: Normal pulses.  Pulmonary:     Effort: Pulmonary effort is normal.     Breath  sounds: Normal breath sounds.  Musculoskeletal:        General: Normal range of motion.  Skin:    General: Skin is warm and dry.  Neurological:     Mental Status: He is alert and oriented to person, place, and time.  Psychiatric:        Mood and Affect: Mood normal.        Behavior: Behavior normal.     RECENT LABS AND TESTS: BMET    Component  Value Date/Time   NA 140 08/04/2019 0719   NA 144 11/17/2018 0000   K 4.3 08/04/2019 0719   CL 104 08/04/2019 0719   CO2 28 08/04/2019 0719   GLUCOSE 119 (H) 08/04/2019 0719   BUN 21 08/04/2019 0719   BUN 14 11/17/2018 0000   CREATININE 0.88 08/04/2019 0719   CALCIUM 9.0 08/04/2019 0719   GFRNONAA 93 11/17/2018 0000   GFRAA 108 11/17/2018 0000   Lab Results  Component Value Date   HGBA1C 6.2 07/07/2019   HGBA1C 5.9 (H) 11/17/2018   HGBA1C 5.9 (H) 08/18/2018   HGBA1C 5.9 (H) 02/17/2018   HGBA1C 5.9 (H) 01/05/2018   Lab Results  Component Value Date   INSULIN 21.9 11/17/2018   INSULIN 24.0 08/18/2018   INSULIN 33.0 (H) 02/17/2018   INSULIN 19.4 01/05/2018   INSULIN 21.9 09/08/2017   CBC    Component Value Date/Time   WBC 5.5 07/07/2019 0835   RBC 5.35 07/07/2019 0835   HGB 15.3 07/07/2019 0835   HGB 14.0 08/18/2018 0830   HCT 46.1 07/07/2019 0835   HCT 42.7 08/18/2018 0830   PLT 252.0 07/07/2019 0835   MCV 86.1 07/07/2019 0835   MCV 86 08/18/2018 0830   MCH 28.3 08/18/2018 0830   MCH 28.8 01/13/2012 0328   MCHC 33.1 07/07/2019 0835   RDW 14.2 07/07/2019 0835   RDW 13.2 08/18/2018 0830   LYMPHSABS 1.7 07/07/2019 0835   LYMPHSABS 1.5 08/18/2018 0830   MONOABS 0.5 07/07/2019 0835   EOSABS 0.2 07/07/2019 0835   EOSABS 0.2 08/18/2018 0830   BASOSABS 0.1 07/07/2019 0835   BASOSABS 0.1 08/18/2018 0830   Iron/TIBC/Ferritin/ %Sat No results found for: IRON, TIBC, FERRITIN, IRONPCTSAT Lipid Panel     Component Value Date/Time   CHOL 154 07/07/2019 0835   CHOL 144 11/17/2018 0000   TRIG 191.0 (H) 07/07/2019 0835     HDL 40.80 07/07/2019 0835   HDL 43 11/17/2018 0000   CHOLHDL 4 07/07/2019 0835   VLDL 38.2 07/07/2019 0835   LDLCALC 75 07/07/2019 0835   LDLCALC 76 11/17/2018 0000   LDLDIRECT 136.0 11/16/2016 0902   Hepatic Function Panel     Component Value Date/Time   PROT 7.2 07/07/2019 0835   PROT 7.2 11/17/2018 0000   ALBUMIN 4.5 07/07/2019 0835   ALBUMIN 4.6 11/17/2018 0000   AST 14 07/07/2019 0835   ALT 25 07/07/2019 0835   ALKPHOS 48 07/07/2019 0835   BILITOT 0.8 07/07/2019 0835   BILITOT 0.4 11/17/2018 0000   BILIDIR 0.0 02/26/2012 1353      Component Value Date/Time   TSH 1.810 08/18/2018 0830   TSH 1.400 09/08/2017 1144   TSH 1.31 06/23/2017 1028        I, Trixie Dredge, am acting as Location manager for Dennard Nip, MD I have reviewed the above documentation for accuracy and completeness, and I agree with the above. -Dennard Nip, MD

## 2019-12-14 ENCOUNTER — Encounter (INDEPENDENT_AMBULATORY_CARE_PROVIDER_SITE_OTHER): Payer: Self-pay | Admitting: Family Medicine

## 2019-12-14 ENCOUNTER — Ambulatory Visit (INDEPENDENT_AMBULATORY_CARE_PROVIDER_SITE_OTHER): Payer: BC Managed Care – PPO | Admitting: Family Medicine

## 2019-12-14 ENCOUNTER — Other Ambulatory Visit: Payer: Self-pay

## 2019-12-14 VITALS — BP 138/78 | HR 77 | Temp 98.3°F | Ht 70.0 in | Wt 324.0 lb

## 2019-12-14 DIAGNOSIS — Z6841 Body Mass Index (BMI) 40.0 and over, adult: Secondary | ICD-10-CM | POA: Diagnosis not present

## 2019-12-14 DIAGNOSIS — Z9189 Other specified personal risk factors, not elsewhere classified: Secondary | ICD-10-CM

## 2019-12-14 DIAGNOSIS — E559 Vitamin D deficiency, unspecified: Secondary | ICD-10-CM | POA: Diagnosis not present

## 2019-12-14 MED ORDER — VITAMIN D (ERGOCALCIFEROL) 1.25 MG (50000 UNIT) PO CAPS: 50000.0000 [IU] | ORAL_CAPSULE | ORAL | 0 refills | Status: DC

## 2019-12-20 NOTE — Progress Notes (Signed)
Chief Complaint:   Bryan [name] is here to discuss his progress with his obesity treatment plan along with follow-up of his obesity related diagnoses. Bryan [name] is on the Category 3 Plan and states he is following his eating plan approximately 80% of the time. Bryan [name] states he is walking on the treadmill and doing yoga for 45 minutes 7 times per week.  Today's visit was #: 21 Starting weight: 326 lbs Starting date: 09/08/17 Today's weight: 324 lbs Today's date: 12/14/2019 Total lbs lost to date: 2 Total lbs lost since last in-office visit: 0  Interim History: Bryan [name] did some celebration eating over the holidays, but he was mindful of his food choices. He has gotten back to his Category 3 plan and he is doing well.  Subjective:   1. Vitamin D deficiency Adisa's Vit D level had worsened off Vit D prescription. He is now back on Vit D. He denies nausea, vomiting, or muscle weakness.  2. At risk for heart disease Bryan [name] is at a higher than average risk for cardiovascular disease due to obesity. Reviewed: no chest pain on exertion, no dyspnea on exertion, and no swelling of ankles.  Assessment/Plan:   1. Vitamin D deficiency Low Vitamin D level contributes to fatigue and are associated with obesity, breast, and colon cancer. We will refill prescription Vit D for 1 month. He will follow-up for routine testing of vitamin D, at least 2-3 times per year to avoid over-replacement. We will continue to monitor.  - Vitamin D, Ergocalciferol, (DRISDOL) 1.25 MG (50000 UT) CAPS capsule; Take 1 capsule (50,000 Units total) by mouth every 7 (seven) days.  Dispense: 4 capsule; Refill: 0  2. At risk for heart disease Bryan [name] was given approximately 15 minutes of coronary artery disease prevention counseling today. He is 59 y.o. male and has risk factors for heart disease including obesity. We discussed intensive lifestyle modifications today with an emphasis on specific weight loss  instructions and strategies.   3. Class 3 severe obesity with serious comorbidity and body mass index (BMI) of 45.0 to 49.9 in adult, unspecified obesity type (HCC) Bryan [name] is currently in the action stage of change. As such, his goal is to continue with weight loss efforts. He has agreed to on the Category 3 Plan.   We discussed the following exercise goals today: Bryan [name] is to continue his current exercise regimen as is.  We discussed the following behavioral modification strategies today: increasing lean protein intake.  Bryan [name] has agreed to follow-up with our clinic in 2 to 3 weeks. He was informed of the importance of frequent follow-up visits to maximize his success with intensive lifestyle modifications for his multiple health conditions.   Objective:   Blood pressure 138/78, pulse 77, temperature 98.3 F (36.8 C), temperature source Oral, height 5\' 10"  (1.778 m), weight (!) 324 lb (147 kg), SpO2 95 %. Body mass index is 46.49 kg/m.  General: Cooperative, alert, well developed, in no acute distress. HEENT: Conjunctivae and lids unremarkable. Neck: No thyromegaly.  Cardiovascular: Regular rhythm.  Lungs: Normal work of breathing. Extremities: No edema.  Neurologic: No focal deficits.   Lab Results  Component Value Date   CREATININE 0.88 08/04/2019   BUN 21 08/04/2019   NA 140 08/04/2019   K 4.3 08/04/2019   CL 104 08/04/2019   CO2 28 08/04/2019   Lab Results  Component Value Date   ALT 25 07/07/2019   AST 14 07/07/2019   ALKPHOS 48 07/07/2019  BILITOT 0.8 07/07/2019   Lab Results  Component Value Date   HGBA1C 6.2 07/07/2019   HGBA1C 5.9 (H) 11/17/2018   HGBA1C 5.9 (H) 08/18/2018   HGBA1C 5.9 (H) 02/17/2018   HGBA1C 5.9 (H) 01/05/2018   Lab Results  Component Value Date   INSULIN 21.9 11/17/2018   INSULIN 24.0 08/18/2018   INSULIN 33.0 (H) 02/17/2018   INSULIN 19.4 01/05/2018   INSULIN 21.9 09/08/2017   Lab Results  Component Value Date   TSH  1.810 08/18/2018   Lab Results  Component Value Date   CHOL 154 07/07/2019   HDL 40.80 07/07/2019   LDLCALC 75 07/07/2019   LDLDIRECT 136.0 11/16/2016   TRIG 191.0 (H) 07/07/2019   CHOLHDL 4 07/07/2019   Lab Results  Component Value Date   WBC 5.5 07/07/2019   HGB 15.3 07/07/2019   HCT 46.1 07/07/2019   MCV 86.1 07/07/2019   PLT 252.0 07/07/2019   No results found for: IRON, TIBC, FERRITIN  Attestation Statements:   Reviewed by clinician on day of visit: allergies, medications, problem list, medical history, surgical history, family history, social history, and previous encounter notes.   I, Trixie Dredge, am acting as transcriptionist for Dennard Nip, MD.  I have reviewed the above documentation for accuracy and completeness, and I agree with the above. -  Dennard Nip, MD

## 2020-01-05 ENCOUNTER — Other Ambulatory Visit: Payer: Self-pay

## 2020-01-08 ENCOUNTER — Ambulatory Visit: Payer: BC Managed Care – PPO | Admitting: Internal Medicine

## 2020-01-08 ENCOUNTER — Other Ambulatory Visit: Payer: Self-pay

## 2020-01-08 ENCOUNTER — Encounter: Payer: Self-pay | Admitting: Internal Medicine

## 2020-01-08 VITALS — BP 133/74 | HR 87 | Temp 97.4°F | Resp 18 | Ht 70.0 in | Wt 326.4 lb

## 2020-01-08 DIAGNOSIS — I10 Essential (primary) hypertension: Secondary | ICD-10-CM

## 2020-01-08 DIAGNOSIS — E559 Vitamin D deficiency, unspecified: Secondary | ICD-10-CM

## 2020-01-08 DIAGNOSIS — R7303 Prediabetes: Secondary | ICD-10-CM

## 2020-01-08 DIAGNOSIS — Z6841 Body Mass Index (BMI) 40.0 and over, adult: Secondary | ICD-10-CM

## 2020-01-08 NOTE — Progress Notes (Signed)
Pre visit review using our clinic review tool, if applicable. No additional management support is needed unless otherwise documented below in the visit note. 

## 2020-01-08 NOTE — Progress Notes (Signed)
   Subjective:    Patient ID: Bryan [name], male    DOB: 06-30-1961, 59 y.o.   MRN: [id number]  DOS:  01/08/2020 Type of visit - description: Follow-up Since the last office visit he is doing well. Has no major concerns   Wt Readings from Last 3 Encounters:  01/08/20 (!) 326 lb 6 oz (148 kg)  12/14/19 (!) 324 lb (147 kg)  10/26/19 (!) 322 lb (146.1 kg)     Review of Systems He is doing better with diet and exercise, specifically he is exercising 30 minutes daily.   Past Medical History:  Diagnosis Date  . Dental crowns present   . Hyperlipidemia   . Lower back pain   . Nephrolithiasis 2017  . Prediabetes 09/04/2014  . Seasonal allergies   . Sebaceous cyst 04/2013   upper back  . Sleep apnea    uses CPAP nightly  . Urolithiasis 10/2016    Past Surgical History:  Procedure Laterality Date  . ADENOIDECTOMY     as a child  . CHOLECYSTECTOMY  01/12/2012   Procedure: LAPAROSCOPIC CHOLECYSTECTOMY WITH INTRAOPERATIVE CHOLANGIOGRAM;  Surgeon: [name], MD;  Location: [facility];  Service: General;  Laterality: N/A;  . CYST EXCISION     cyst removal from back  . EAR CYST EXCISION N/A 05/04/2013   Procedure: Excision subcutaneous mass posterior neck;  Surgeon: [name], MD;  Location: [facility];  Service: General;  Laterality: N/A;        Objective:   Physical Exam BP 133/74 (BP Location: Left Arm, Patient Position: Sitting, Cuff Size: Normal)   Pulse 87   Temp (!) 97.4 F (36.3 C) (Temporal)   Resp 18   Ht 5\' 10"  (1.778 m)   Wt (!) 326 lb 6 oz (148 kg)   SpO2 97%   BMI 46.83 kg/m   General:   Well developed, NAD, BMI noted. HEENT:  Normocephalic . Face symmetric, atraumatic Lungs:  CTA B Normal respiratory effort, no intercostal retractions, no accessory muscle use. Heart: RRR,  no murmur.  No pretibial edema bilaterally  Skin: Not pale. Not jaundice Neurologic:  alert & oriented X3.  Speech normal, gait appropriate for age and  unassisted Psych--  Cognition and judgment appear intact.  Cooperative with normal attention span and concentration.  Behavior appropriate. No anxious or depressed appearing.      Assessment     Assessment Prediabetes HTN Hyperlipidemia Insomnia: on OTCs Morbid obesity, BMI 46 OSA, CPAP Seasonal allergies Kidney stone (first) 10-2016 Vit D def dx 09/2017 Retina detachment DX 06-2019  PLAN: HTN: Controlled on HCTZ. Prediabetes: Doing better, to be seen at the wellness clinic this week, he is already improving his exercise and feeling great.  Continue Metformin. Hyperlipidemia: On Lipitor.  No apparent side effects Morbid obesity: Working with a wellness clinic. Vitamin D deficiency: On ergocalciferol Preventive care: Defer Tdap for now. No labs needed today, labs likely to be check this week elsewhere. RTC a month CPX  This visit occurred during the SARS-CoV-2 public health emergency.  Safety protocols were in place, including screening questions prior to the visit, additional usage of staff PPE, and extensive cleaning of exam room while observing appropriate contact time as indicated for disinfecting solutions.

## 2020-01-08 NOTE — Assessment & Plan Note (Signed)
HTN: Controlled on HCTZ. Prediabetes: Doing better, to be seen at the wellness clinic this week, he is already improving his exercise and feeling great.  Continue Metformin. Hyperlipidemia: On Lipitor.  No apparent side effects Morbid obesity: Working with a wellness clinic. Vitamin D deficiency: On ergocalciferol Preventive care: Defer Tdap for now. No labs needed today, labs likely to be check this week elsewhere. RTC a month CPX

## 2020-01-08 NOTE — Patient Instructions (Addendum)
  GO TO THE FRONT DESK  Please come back for a physical in 8 months , make an appointment    Check the  blood pressure 2 or 3 times a month   BP GOAL is between 110/65 and  135/85. If it is consistently higher or lower, let me know

## 2020-01-10 ENCOUNTER — Ambulatory Visit (INDEPENDENT_AMBULATORY_CARE_PROVIDER_SITE_OTHER): Payer: BC Managed Care – PPO | Admitting: Family Medicine

## 2020-01-10 ENCOUNTER — Encounter (INDEPENDENT_AMBULATORY_CARE_PROVIDER_SITE_OTHER): Payer: Self-pay | Admitting: Family Medicine

## 2020-01-10 ENCOUNTER — Other Ambulatory Visit: Payer: Self-pay

## 2020-01-10 VITALS — BP 133/72 | HR 84 | Temp 98.1°F | Ht 70.0 in | Wt 319.0 lb

## 2020-01-10 DIAGNOSIS — E8881 Metabolic syndrome: Secondary | ICD-10-CM

## 2020-01-10 DIAGNOSIS — E7849 Other hyperlipidemia: Secondary | ICD-10-CM

## 2020-01-10 DIAGNOSIS — E559 Vitamin D deficiency, unspecified: Secondary | ICD-10-CM | POA: Diagnosis not present

## 2020-01-10 DIAGNOSIS — Z9189 Other specified personal risk factors, not elsewhere classified: Secondary | ICD-10-CM | POA: Diagnosis not present

## 2020-01-10 DIAGNOSIS — Z6841 Body Mass Index (BMI) 40.0 and over, adult: Secondary | ICD-10-CM

## 2020-01-10 MED ORDER — VITAMIN D (ERGOCALCIFEROL) 1.25 MG (50000 UNIT) PO CAPS: 50000.0000 [IU] | ORAL_CAPSULE | ORAL | 0 refills | Status: DC

## 2020-01-10 NOTE — Progress Notes (Signed)
Chief Complaint:   OBESITY Bryan [name] is here to discuss his progress with his obesity treatment plan along with follow-up of his obesity related diagnoses. [name] is on the Category 3 Plan and states he is following his eating plan approximately 75% of the time. [name] states he is on the treadmill for 30 minutes 7 times per week.  Today's visit was #: 63 Starting weight: 326 lbs Starting date: 09/08/17 Today's weight: 319 lbs Today's date: 01/10/2020 Total lbs lost to date: 7 Total lbs lost since last in-office visit: 5  Interim History: Bryan [name] has done well with weight loss since his last visit. He has added daily exercise into his routine. He feels well overall and he is happy his Category 3 plan.  Subjective:   1. Vitamin D deficiency Bryan [name] is due for labs, and he is tolerating Vit D well and needs a refill today.  2. Insulin resistance Bryan [name] is working on his diet and exercise, and he is due for labs.  3. Other hyperlipidemia Bryan [name] is on Lipitor and he is due for labs. He denies chest pain or myalgias.  4. At risk for heart disease Bryan [name] is at a higher than average risk for cardiovascular disease due to obesity. Reviewed: no chest pain on exertion, no dyspnea on exertion, and no swelling of ankles.  Assessment/Plan:   1. Vitamin D deficiency Low Vitamin D level contributes to fatigue and are associated with obesity, breast, and colon cancer. We will refill prescription Vitamin D for 1 month. Rice will follow-up for routine testing of Vitamin D, at least 2-3 times per year to avoid over-replacement.  - VITAMIN D 25 Hydroxy (Vit-D Deficiency, Fractures) - Vitamin D, Ergocalciferol, (DRISDOL) 1.25 MG (50000 UNIT) CAPS capsule; Take 1 capsule (50,000 Units total) by mouth every 7 (seven) days.  Dispense: 4 capsule; Refill: 0  2. Insulin resistance [place] agreed to continue metformin and diet, and will continue to work on weight loss, exercise, and decreasing  simple carbohydrates to help decrease the risk of diabetes. We will check labs today. Latron agreed to follow-up with [place] as directed to closely monitor his progress.  - Comprehensive metabolic panel - Hemoglobin A1c - Insulin, random - T3 - T4, free - TSH  3. Other hyperlipidemia Cardiovascular risk and specific lipid/LDL goals reviewed. We discussed several lifestyle modifications today and Bryan [name] will continue to work on diet, exercise and weight loss efforts. We will check labs today. Orders and follow up as documented in patient record.   Counseling Intensive lifestyle modifications are the first line treatment for this issue. . Dietary changes: Increase soluble fiber. Decrease simple carbohydrates. . Exercise changes: Moderate to vigorous-intensity aerobic activity 150 minutes per week if tolerated. . Lipid-lowering medications: see documented in medical record.  - Lipid Panel With LDL/HDL Ratio  4. At risk for heart disease Bryan [name] was given approximately 15 minutes of coronary artery disease prevention counseling today. He is 59 y.o. male and has risk factors for heart disease including obesity. We discussed intensive lifestyle modifications today with an emphasis on specific weight loss instructions and strategies.   Repetitive spaced learning was employed today to elicit superior memory formation and behavioral change.  5. Class 3 severe obesity with serious comorbidity and body mass index (BMI) of 45.0 to 49.9 in adult, unspecified obesity type (HCC) [name] is currently in the action stage of change. As such, his goal is to continue with weight loss efforts. He has agreed to the Category 3 Plan.  Exercise goals: Bryan [name] is to continue his current exercise regimen as is.  Behavioral modification strategies: increasing lean protein intake.  Bryan [name] has agreed to follow-up with our clinic in 3 weeks. He was informed of the importance of frequent follow-up visits to maximize  his success with intensive lifestyle modifications for his multiple health conditions.   Bryan [name] was informed we would discuss his lab results at his next visit unless there is a critical issue that needs to be addressed sooner. Bryan [name] agreed to keep his next visit at the agreed upon time to discuss these results.  Objective:   Blood pressure 133/72, pulse 84, temperature 98.1 F (36.7 C), temperature source Oral, height 5\' 10"  (1.778 m), weight (!) 319 lb (144.7 kg), SpO2 95 %. Body mass index is 45.77 kg/m.  General: Cooperative, alert, well developed, in no acute distress. HEENT: Conjunctivae and lids unremarkable. Cardiovascular: Regular rhythm.  Lungs: Normal work of breathing. Neurologic: No focal deficits.   Lab Results  Component Value Date   CREATININE 0.88 08/04/2019   BUN 21 08/04/2019   NA 140 08/04/2019   K 4.3 08/04/2019   CL 104 08/04/2019   CO2 28 08/04/2019   Lab Results  Component Value Date   ALT 25 07/07/2019   AST 14 07/07/2019   ALKPHOS 48 07/07/2019   BILITOT 0.8 07/07/2019   Lab Results  Component Value Date   HGBA1C 6.2 07/07/2019   HGBA1C 5.9 (H) 11/17/2018   HGBA1C 5.9 (H) 08/18/2018   HGBA1C 5.9 (H) 02/17/2018   HGBA1C 5.9 (H) 01/05/2018   Lab Results  Component Value Date   INSULIN 21.9 11/17/2018   INSULIN 24.0 08/18/2018   INSULIN 33.0 (H) 02/17/2018   INSULIN 19.4 01/05/2018   INSULIN 21.9 09/08/2017   Lab Results  Component Value Date   TSH 1.810 08/18/2018   Lab Results  Component Value Date   CHOL 154 07/07/2019   HDL 40.80 07/07/2019   LDLCALC 75 07/07/2019   LDLDIRECT 136.0 11/16/2016   TRIG 191.0 (H) 07/07/2019   CHOLHDL 4 07/07/2019   Lab Results  Component Value Date   WBC 5.5 07/07/2019   HGB 15.3 07/07/2019   HCT 46.1 07/07/2019   MCV 86.1 07/07/2019   PLT 252.0 07/07/2019   No results found for: IRON, TIBC, FERRITIN  Attestation Statements:   Reviewed by clinician on day of visit: allergies,  medications, problem list, medical history, surgical history, family history, social history, and previous encounter notes.   I, Trixie Dredge, am acting as transcriptionist for Dennard Nip, MD.  I have reviewed the above documentation for accuracy and completeness, and I agree with the above. -  Dennard Nip, MD

## 2020-01-11 LAB — LIPID PANEL WITH LDL/HDL RATIO
Cholesterol, Total: 143 mg/dL (ref 100–199)
HDL: 41 mg/dL (ref >39)
LDL Chol Calc (NIH): 72 mg/dL (ref 0–99)
LDL/HDL Ratio: 1.8 ratio (ref 0.0–3.6)
Triglycerides: 180 mg/dL — ABNORMAL HIGH (ref 0–149)
VLDL Cholesterol Cal: 30 mg/dL (ref 5–40)

## 2020-01-11 LAB — COMPREHENSIVE METABOLIC PANEL
ALT: 27 IU/L (ref 0–44)
AST: 17 IU/L (ref 0–40)
Albumin/Globulin Ratio: 1.7 (ref 1.2–2.2)
Albumin: 4.5 g/dL (ref 3.8–4.9)
Alkaline Phosphatase: 57 IU/L (ref 39–117)
BUN/Creatinine Ratio: 15 (ref 9–20)
BUN: 15 mg/dL (ref 6–24)
Bilirubin Total: 0.7 mg/dL (ref 0.0–1.2)
CO2: 24 mmol/L (ref 20–29)
Calcium: 9.6 mg/dL (ref 8.7–10.2)
Chloride: 100 mmol/L (ref 96–106)
Creatinine, Ser: 1.01 mg/dL (ref 0.76–1.27)
GFR calc Af Amer: 96 mL/min/{1.73_m2} (ref >59)
GFR calc non Af Amer: 83 mL/min/{1.73_m2} (ref >59)
Globulin, Total: 2.7 g/dL (ref 1.5–4.5)
Glucose: 110 mg/dL — ABNORMAL HIGH (ref 65–99)
Potassium: 3.9 mmol/L (ref 3.5–5.2)
Sodium: 141 mmol/L (ref 134–144)
Total Protein: 7.2 g/dL (ref 6.0–8.5)

## 2020-01-11 LAB — VITAMIN D 25 HYDROXY (VIT D DEFICIENCY, FRACTURES): Vit D, 25-Hydroxy: 39.2 ng/mL (ref 30.0–100.0)

## 2020-01-11 LAB — T4, FREE: Free T4: 1.59 ng/dL (ref 0.82–1.77)

## 2020-01-11 LAB — HEMOGLOBIN A1C
Est. average glucose Bld gHb Est-mCnc: 137 mg/dL
Hgb A1c MFr Bld: 6.4 % — ABNORMAL HIGH (ref 4.8–5.6)

## 2020-01-11 LAB — TSH: TSH: 1.56 u[IU]/mL (ref 0.450–4.500)

## 2020-01-11 LAB — T3: T3, Total: 125 ng/dL (ref 71–180)

## 2020-01-11 LAB — INSULIN, RANDOM: INSULIN: 24 u[IU]/mL (ref 2.6–24.9)

## 2020-02-07 ENCOUNTER — Ambulatory Visit (INDEPENDENT_AMBULATORY_CARE_PROVIDER_SITE_OTHER): Payer: BC Managed Care – PPO | Admitting: Family Medicine

## 2020-02-07 ENCOUNTER — Encounter (INDEPENDENT_AMBULATORY_CARE_PROVIDER_SITE_OTHER): Payer: Self-pay | Admitting: Family Medicine

## 2020-02-07 ENCOUNTER — Other Ambulatory Visit: Payer: Self-pay

## 2020-02-07 VITALS — BP 128/81 | HR 77 | Temp 97.7°F | Ht 70.0 in | Wt 321.0 lb

## 2020-02-07 DIAGNOSIS — R7303 Prediabetes: Secondary | ICD-10-CM | POA: Diagnosis not present

## 2020-02-07 DIAGNOSIS — Z9189 Other specified personal risk factors, not elsewhere classified: Secondary | ICD-10-CM

## 2020-02-07 DIAGNOSIS — E559 Vitamin D deficiency, unspecified: Secondary | ICD-10-CM

## 2020-02-07 DIAGNOSIS — Z6841 Body Mass Index (BMI) 40.0 and over, adult: Secondary | ICD-10-CM

## 2020-02-07 MED ORDER — VITAMIN D (ERGOCALCIFEROL) 1.25 MG (50000 UNIT) PO CAPS: 50000.0000 [IU] | ORAL_CAPSULE | ORAL | 0 refills | Status: DC

## 2020-02-07 MED ORDER — METFORMIN HCL 500 MG PO TABS: 500.0000 mg | ORAL_TABLET | Freq: Three times a day (TID) | ORAL | 0 refills | Status: DC

## 2020-02-07 NOTE — Progress Notes (Signed)
Chief Complaint:   OBESITY Bryan [name] is here to discuss his progress with his obesity treatment plan along with follow-up of his obesity related diagnoses. Bryan [name] is on the Category 3 Plan and states he is following his eating plan approximately 70% of the time. Bryan [name] states he is on the treadmill for 30 minutes 7 times per week.  Today's visit was #: 18 Starting weight: 326 lbs Starting date: 09/08/2017 Today's weight: 321 lbs Today's date: 02/07/2020 Total lbs lost to date: 5 Total lbs lost since last in-office visit: 0  Interim History: Bryan [name] is retaining some water weight today. He is doing well on his plan for breakfast and lunch, but off track at dinner. His protein intake has decreased.  Subjective:   1. Pre-diabetes Bryan [name]'s A1c is worsening and is very close to diabetes mellitus. He is on metformin BID and he denies nausea or vomiting. I discussed labs with the patient today.  2. Vitamin D deficiency Bryan [name]'s Vit D level is slowly increasing, but it is not yet at goal. I discussed labs with the patient today.  3. At risk for diabetes mellitus Bryan [name] is at higher than average risk for developing diabetes due to his obesity.   Assessment/Plan:   1. Pre-diabetes Bryan [name] will continue to work on weight loss, diet, exercise, and decreasing simple carbohydrates to help decrease the risk of diabetes. Bryan [name] agreed to increase metformin to 500 mg TID with no refill. We will recheck labs in 2 months.  - metFORMIN (GLUCOPHAGE) 500 MG tablet; Take 1 tablet (500 mg total) by mouth 3 (three) times daily.  Dispense: 90 tablet; Refill: 0  2. Vitamin D deficiency Low Vitamin D level contributes to fatigue and are associated with obesity, breast, and colon cancer. We will refill prescription Vitamin D for 2 months. Bryan [name] will follow-up for routine testing of Vitamin D, at least 2-3 times per year to avoid over-replacement.  - Vitamin D, Ergocalciferol, (DRISDOL) 1.25 MG  (50000 UNIT) CAPS capsule; Take 1 capsule (50,000 Units total) by mouth every 7 (seven) days.  Dispense: 8 capsule; Refill: 0  3. At risk for diabetes mellitus Bryan [name] was given approximately 15 minutes of diabetes education and counseling today. We discussed intensive lifestyle modifications today with an emphasis on weight loss as well as increasing exercise and decreasing simple carbohydrates in his diet. We also reviewed medication options with an emphasis on risk versus benefit of those discussed.   Repetitive spaced learning was employed today to elicit superior memory formation and behavioral change.  4. Class 3 severe obesity with serious comorbidity and body mass index (BMI) of 45.0 to 49.9 in adult, unspecified obesity type (HCC) Bryan [name] is currently in the action stage of change. As such, his goal is to continue with weight loss efforts. He has agreed to the Category 3 Plan and keeping a food journal and adhering to recommended goals of 400-600 calories and 40+ grams of protein at supper daily.   Exercise goals: Bryan [name] is to continue his exercise regimen as is.  Behavioral modification strategies: increasing lean protein intake and meal planning and cooking strategies.  Bryan [name] has agreed to follow-up with our clinic in 3 weeks. He was informed of the importance of frequent follow-up visits to maximize his success with intensive lifestyle modifications for his multiple health conditions.   Objective:   Blood pressure 128/81, pulse 77, temperature 97.7 F (36.5 C), temperature source Oral, height 5\' 10"  (1.778 m), weight (!) 321 lb (145.6 kg), SpO2 96 %.  Body mass index is 46.06 kg/m.  General: Cooperative, alert, well developed, in no acute distress. HEENT: Conjunctivae and lids unremarkable. Cardiovascular: Regular rhythm.  Lungs: Normal work of breathing. Neurologic: No focal deficits.   Lab Results  Component Value Date   CREATININE 1.01 01/10/2020   BUN 15 01/10/2020    NA 141 01/10/2020   K 3.9 01/10/2020   CL 100 01/10/2020   CO2 24 01/10/2020   Lab Results  Component Value Date   ALT 27 01/10/2020   AST 17 01/10/2020   ALKPHOS 57 01/10/2020   BILITOT 0.7 01/10/2020   Lab Results  Component Value Date   HGBA1C 6.4 (H) 01/10/2020   HGBA1C 6.2 07/07/2019   HGBA1C 5.9 (H) 11/17/2018   HGBA1C 5.9 (H) 08/18/2018   HGBA1C 5.9 (H) 02/17/2018   Lab Results  Component Value Date   INSULIN 24.0 01/10/2020   INSULIN 21.9 11/17/2018   INSULIN 24.0 08/18/2018   INSULIN 33.0 (H) 02/17/2018   INSULIN 19.4 01/05/2018   Lab Results  Component Value Date   TSH 1.560 01/10/2020   Lab Results  Component Value Date   CHOL 143 01/10/2020   HDL 41 01/10/2020   LDLCALC 72 01/10/2020   LDLDIRECT 136.0 11/16/2016   TRIG 180 (H) 01/10/2020   CHOLHDL 4 07/07/2019   Lab Results  Component Value Date   WBC 5.5 07/07/2019   HGB 15.3 07/07/2019   HCT 46.1 07/07/2019   MCV 86.1 07/07/2019   PLT 252.0 07/07/2019   No results found for: IRON, TIBC, FERRITIN  Attestation Statements:   Reviewed by clinician on day of visit: allergies, medications, problem list, medical history, surgical history, family history, social history, and previous encounter notes.   I, Trixie Dredge, am acting as transcriptionist for Dennard Nip, MD.  I have reviewed the above documentation for accuracy and completeness, and I agree with the above. -  Dennard Nip, MD

## 2020-03-06 ENCOUNTER — Ambulatory Visit (INDEPENDENT_AMBULATORY_CARE_PROVIDER_SITE_OTHER): Payer: BC Managed Care – PPO | Admitting: Family Medicine

## 2020-03-06 ENCOUNTER — Encounter (INDEPENDENT_AMBULATORY_CARE_PROVIDER_SITE_OTHER): Payer: Self-pay | Admitting: Family Medicine

## 2020-03-06 ENCOUNTER — Other Ambulatory Visit: Payer: Self-pay

## 2020-03-06 VITALS — BP 130/78 | HR 71 | Temp 97.8°F | Ht 70.0 in | Wt 327.0 lb

## 2020-03-06 DIAGNOSIS — E559 Vitamin D deficiency, unspecified: Secondary | ICD-10-CM

## 2020-03-06 DIAGNOSIS — Z9189 Other specified personal risk factors, not elsewhere classified: Secondary | ICD-10-CM | POA: Diagnosis not present

## 2020-03-06 DIAGNOSIS — R7303 Prediabetes: Secondary | ICD-10-CM | POA: Diagnosis not present

## 2020-03-06 DIAGNOSIS — I10 Essential (primary) hypertension: Secondary | ICD-10-CM | POA: Diagnosis not present

## 2020-03-06 DIAGNOSIS — Z6841 Body Mass Index (BMI) 40.0 and over, adult: Secondary | ICD-10-CM

## 2020-03-06 MED ORDER — METFORMIN HCL 500 MG PO TABS: 500.0000 mg | ORAL_TABLET | Freq: Three times a day (TID) | ORAL | 0 refills | Status: DC

## 2020-03-06 MED ORDER — HYDROCHLOROTHIAZIDE 25 MG PO TABS: 25.0000 mg | ORAL_TABLET | Freq: Every day | ORAL | 1 refills | Status: DC

## 2020-03-06 NOTE — Progress Notes (Signed)
Chief Complaint:   OBESITY Bryan [name] is here to discuss his progress with his obesity treatment plan along with follow-up of his obesity related diagnoses. Bryan [name] is on the Category 3 Plan and keeping a food journal and adhering to recommended goals of 400-600 calories and 40+ grams of protein at supper daily and states he is following his eating plan approximately 60% of the time. Bryan [name] states he is walking for 30 minutes 6 times per week.  Today's visit was #: 59 Starting weight: 326 lbs Starting date: 09/08/2017 Today's weight: 327 lbs Today's date: 03/06/2020 Total lbs lost to date: 0 Total lbs lost since last in-office visit: 0  Interim History: Bryan [name] feels that he can follow breakfast and lunch on the Category 3 plan, then dinner will be difficult. Primarily he is unable to consume all of his protein ounces. We discussed increasing higher protein snacks.throughout the day to boost overall daily protein intake, which will allow him to reduce protein amount with dinner.  Subjective:   1. Vitamin D deficiency Bryan [name]'s Vit D level on 01/10/2020 was 39.2. He is on prescription strength Vit D supplementation.  2. Pre-diabetes Bryan [name]'s A1c on 01/10/2020 was 6.4 and GFR at 83. He is currently on metformin 500 mg TID, and he denies GI upset.  3. Essential hypertension Bryan [name] is currently on hydrochlorothiazide 25 mg q daily. Last CMP on 01/10/2020 was stable.  4. At risk for impaired metabolic function Bryan [name] is at increased risk for impaired metabolic function due to decreased protein intake.  Assessment/Plan:   1. Vitamin D deficiency Low Vitamin D level contributes to fatigue and are associated with obesity, breast, and colon cancer. Bryan [name] agreed to continue taking prescription Vitamin D 50,000 IU every week and will follow-up for routine testing of Vitamin D, at least 2-3 times per year to avoid over-replacement.  2. Pre-diabetes Bryan [name] will continue to work on weight  loss, exercise, and decreasing simple carbohydrates to help decrease the risk of diabetes. We will refill metformin for 1 month. We will recheck labs in May 2021.  - metFORMIN (GLUCOPHAGE) 500 MG tablet; Take 1 tablet (500 mg total) by mouth 3 (three) times daily.  Dispense: 90 tablet; Refill: 0  3. Essential hypertension Bryan [name] is working on healthy weight loss and exercise to improve blood pressure control. We will watch for signs of hypotension as he continues his lifestyle modifications. We will refill hydrochlorothiazide for 1 month.  - hydrochlorothiazide (HYDRODIURIL) 25 MG tablet; Take 1 tablet (25 mg total) by mouth daily.  Dispense: 90 tablet; Refill: 1  4. At risk for impaired metabolic function Bryan [name] was given approximately 15 minutes of impaired  metabolic function prevention counseling today. We discussed intensive lifestyle modifications today with an emphasis on specific nutrition and exercise instructions and strategies.   Repetitive spaced learning was employed today to elicit superior memory formation and behavioral change.  5. Class 3 severe obesity with serious comorbidity and body mass index (BMI) of 45.0 to 49.9 in adult, unspecified obesity type (HCC) Bryan [name] is currently in the action stage of change. As such, his goal is to continue with weight loss efforts. He has agreed to the Category 4 Plan.   Exercise goals: As is.  Behavioral modification strategies: increasing lean protein intake and meal planning and cooking strategies.  Bryan [name] has agreed to follow-up with our clinic in 4 weeks. He was informed of the importance of frequent follow-up visits to maximize his success with intensive lifestyle modifications for his multiple  health conditions.   Objective:   Blood pressure 130/78, pulse 71, temperature 97.8 F (36.6 C), temperature source Oral, height 5\' 10"  (1.778 m), weight (!) 327 lb (148.3 kg), SpO2 96 %. Body mass index is 46.92 kg/m.  General:  Cooperative, alert, well developed, in no acute distress. HEENT: Conjunctivae and lids unremarkable. Cardiovascular: Regular rhythm.  Lungs: Normal work of breathing. Neurologic: No focal deficits.   Lab Results  Component Value Date   CREATININE 1.01 01/10/2020   BUN 15 01/10/2020   NA 141 01/10/2020   K 3.9 01/10/2020   CL 100 01/10/2020   CO2 24 01/10/2020   Lab Results  Component Value Date   ALT 27 01/10/2020   AST 17 01/10/2020   ALKPHOS 57 01/10/2020   BILITOT 0.7 01/10/2020   Lab Results  Component Value Date   HGBA1C 6.4 (H) 01/10/2020   HGBA1C 6.2 07/07/2019   HGBA1C 5.9 (H) 11/17/2018   HGBA1C 5.9 (H) 08/18/2018   HGBA1C 5.9 (H) 02/17/2018   Lab Results  Component Value Date   INSULIN 24.0 01/10/2020   INSULIN 21.9 11/17/2018   INSULIN 24.0 08/18/2018   INSULIN 33.0 (H) 02/17/2018   INSULIN 19.4 01/05/2018   Lab Results  Component Value Date   TSH 1.560 01/10/2020   Lab Results  Component Value Date   CHOL 143 01/10/2020   HDL 41 01/10/2020   LDLCALC 72 01/10/2020   LDLDIRECT 136.0 11/16/2016   TRIG 180 (H) 01/10/2020   CHOLHDL 4 07/07/2019   Lab Results  Component Value Date   WBC 5.5 07/07/2019   HGB 15.3 07/07/2019   HCT 46.1 07/07/2019   MCV 86.1 07/07/2019   PLT 252.0 07/07/2019   No results found for: IRON, TIBC, FERRITIN  Attestation Statements:   Reviewed by clinician on day of visit: allergies, medications, problem list, medical history, surgical history, family history, social history, and previous encounter notes.   I, Trixie Dredge, am acting as transcriptionist for Dennard Nip, MD.  I have reviewed the above documentation for accuracy and completeness, and I agree with the above. -  Dennard Nip, MD

## 2020-04-03 ENCOUNTER — Ambulatory Visit (INDEPENDENT_AMBULATORY_CARE_PROVIDER_SITE_OTHER): Payer: BC Managed Care – PPO | Admitting: Family Medicine

## 2020-04-03 ENCOUNTER — Encounter (INDEPENDENT_AMBULATORY_CARE_PROVIDER_SITE_OTHER): Payer: Self-pay | Admitting: Family Medicine

## 2020-04-03 ENCOUNTER — Other Ambulatory Visit: Payer: Self-pay

## 2020-04-03 VITALS — BP 124/79 | HR 73 | Temp 97.7°F | Ht 70.0 in | Wt 318.0 lb

## 2020-04-03 DIAGNOSIS — R7303 Prediabetes: Secondary | ICD-10-CM | POA: Diagnosis not present

## 2020-04-03 DIAGNOSIS — E559 Vitamin D deficiency, unspecified: Secondary | ICD-10-CM | POA: Diagnosis not present

## 2020-04-03 DIAGNOSIS — Z9189 Other specified personal risk factors, not elsewhere classified: Secondary | ICD-10-CM | POA: Diagnosis not present

## 2020-04-03 DIAGNOSIS — Z6841 Body Mass Index (BMI) 40.0 and over, adult: Secondary | ICD-10-CM

## 2020-04-03 DIAGNOSIS — R0602 Shortness of breath: Secondary | ICD-10-CM

## 2020-04-03 MED ORDER — VITAMIN D (ERGOCALCIFEROL) 1.25 MG (50000 UNIT) PO CAPS: 50000.0000 [IU] | ORAL_CAPSULE | ORAL | 0 refills | Status: DC

## 2020-04-04 NOTE — Progress Notes (Signed)
Chief Complaint:   OBESITY Bryan [name] is here to discuss his progress with his obesity treatment plan along with follow-up of his obesity related diagnoses. Bryan [name] is on the Category 3 Plan and states he is following his eating plan approximately 80% of the time. Bryan [name] states he is walking for 30-45 minutes 7 times per week.  Today's visit was #: 75 Starting weight: 326 lbs Starting date: 09/08/2017 Today's weight: 318 lbs Today's date: 04/03/2020 Total lbs lost to date: 8 Total lbs lost since last in-office visit: 9  Interim History: Bryan [name] has done very well with Category 3 plan and he is doing better with weight loss and increasing lean protein. He had a day of celebration eating but he was still mindful of his choices.  Subjective:   1. Vitamin D deficiency Bryan [name] is stable on Vit D, and he requests a refill today. He denies nausea, vomiting, or muscle weakness.  2. Pre-diabetes Bryan [name] is stable on metformin, and he is doing better with diet. He denies hypoglycemia.  3. Shortness of breath on exertion Bryan [name]'s symptoms are unchanged, and he is due to have VO2 rechecked.  4. At risk for activity intolerance Bryan [name] is at risk for exercise intolerance due to shortness of breath.  Assessment/Plan:   1. Vitamin D deficiency Low Vitamin D level contributes to fatigue and are associated with obesity, breast, and colon cancer. We will refill prescription Vitamin D for 1 month. Uel will follow-up for routine testing of Vitamin D, at least 2-3 times per year to avoid over-replacement. We will recheck labs in 1-2 months.  - Vitamin D, Ergocalciferol, (DRISDOL) 1.25 MG (50000 UNIT) CAPS capsule; Take 1 capsule (50,000 Units total) by mouth every 7 (seven) days.  Dispense: 4 capsule; Refill: 0  2. Pre-diabetes [name] will continue to work on weight loss, diet, exercise, and decreasing simple carbohydrates to help decrease the risk of diabetes. We will recheck labs in 1-2  months.  3. Shortness of breath on exertion Bryan [name] does feel that he gets out of breath more easily that he used to when he exercises. Bryan [name]'s VO2 improved and his shortness of breath is likely related to exercise intolerance. He has agreed to work on weight loss and increase activity to treat his exercise induced shortness of breath. Will continue to monitor closely.  4. At risk for activity intolerance Bryan [name] was given approximately 15 minutes of exercise intolerance counseling today. He is 59 y.o. male and has risk factors exercise intolerance including obesity. We discussed intensive lifestyle modifications today with an emphasis on specific weight loss instructions and strategies. Bryan [name] will slowly increase activity as tolerated.  Repetitive spaced learning was employed today to elicit superior memory formation and behavioral change.  5. Class 3 severe obesity with serious comorbidity and body mass index (BMI) of 40.0 to 44.9 in adult, unspecified obesity type (HCC) Bryan [name] is currently in the action stage of change. As such, his goal is to continue with weight loss efforts. He has agreed to change to keeping a food journal and adhering to recommended goals of 1500-1800 calories and 100+ grams of protein daily.   Exercise goals: For substantial health benefits, adults should do at least 150 minutes (2 hours and 30 minutes) a week of moderate-intensity, or 75 minutes (1 hour and 15 minutes) a week of vigorous-intensity aerobic physical activity, or an equivalent combination of moderate- and vigorous-intensity aerobic activity. Aerobic activity should be performed in episodes of at least 10 minutes, and preferably, it  should be spread throughout the week.  Behavioral modification strategies: increasing lean protein intake and meal planning and cooking strategies.  Bryan [name] has agreed to follow-up with our clinic in 3 to 4 weeks. He was informed of the importance of frequent follow-up visits  to maximize his success with intensive lifestyle modifications for his multiple health conditions.   Objective:   Blood pressure 124/79, pulse 73, temperature 97.7 F (36.5 C), temperature source Oral, height 5\' 10"  (1.778 m), weight (!) 318 lb (144.2 kg), SpO2 95 %. Body mass index is 45.63 kg/m.  General: Cooperative, alert, well developed, in no acute distress. HEENT: Conjunctivae and lids unremarkable. Cardiovascular: Regular rhythm.  Lungs: Normal work of breathing. Neurologic: No focal deficits.   Lab Results  Component Value Date   CREATININE 1.01 01/10/2020   BUN 15 01/10/2020   NA 141 01/10/2020   K 3.9 01/10/2020   CL 100 01/10/2020   CO2 24 01/10/2020   Lab Results  Component Value Date   ALT 27 01/10/2020   AST 17 01/10/2020   ALKPHOS 57 01/10/2020   BILITOT 0.7 01/10/2020   Lab Results  Component Value Date   HGBA1C 6.4 (H) 01/10/2020   HGBA1C 6.2 07/07/2019   HGBA1C 5.9 (H) 11/17/2018   HGBA1C 5.9 (H) 08/18/2018   HGBA1C 5.9 (H) 02/17/2018   Lab Results  Component Value Date   INSULIN 24.0 01/10/2020   INSULIN 21.9 11/17/2018   INSULIN 24.0 08/18/2018   INSULIN 33.0 (H) 02/17/2018   INSULIN 19.4 01/05/2018   Lab Results  Component Value Date   TSH 1.560 01/10/2020   Lab Results  Component Value Date   CHOL 143 01/10/2020   HDL 41 01/10/2020   LDLCALC 72 01/10/2020   LDLDIRECT 136.0 11/16/2016   TRIG 180 (H) 01/10/2020   CHOLHDL 4 07/07/2019   Lab Results  Component Value Date   WBC 5.5 07/07/2019   HGB 15.3 07/07/2019   HCT 46.1 07/07/2019   MCV 86.1 07/07/2019   PLT 252.0 07/07/2019   No results found for: IRON, TIBC, FERRITIN  Attestation Statements:   Reviewed by clinician on day of visit: allergies, medications, problem list, medical history, surgical history, family history, social history, and previous encounter notes.   I, Trixie Dredge, am acting as transcriptionist for Dennard Nip, MD.  I have reviewed the above  documentation for accuracy and completeness, and I agree with the above. -  Dennard Nip, MD

## 2020-04-07 ENCOUNTER — Other Ambulatory Visit: Payer: Self-pay | Admitting: Internal Medicine

## 2020-04-07 ENCOUNTER — Other Ambulatory Visit (INDEPENDENT_AMBULATORY_CARE_PROVIDER_SITE_OTHER): Payer: Self-pay | Admitting: Family Medicine

## 2020-04-07 DIAGNOSIS — R7303 Prediabetes: Secondary | ICD-10-CM

## 2020-04-08 ENCOUNTER — Encounter (INDEPENDENT_AMBULATORY_CARE_PROVIDER_SITE_OTHER): Payer: Self-pay

## 2020-04-10 ENCOUNTER — Ambulatory Visit (INDEPENDENT_AMBULATORY_CARE_PROVIDER_SITE_OTHER): Payer: BC Managed Care – PPO | Admitting: Psychologist

## 2020-04-10 DIAGNOSIS — Z9119 Patient's noncompliance with other medical treatment and regimen: Secondary | ICD-10-CM

## 2020-04-10 DIAGNOSIS — F411 Generalized anxiety disorder: Secondary | ICD-10-CM | POA: Diagnosis not present

## 2020-04-23 ENCOUNTER — Ambulatory Visit (INDEPENDENT_AMBULATORY_CARE_PROVIDER_SITE_OTHER): Payer: BC Managed Care – PPO | Admitting: Psychologist

## 2020-04-23 DIAGNOSIS — Z630 Problems in relationship with spouse or partner: Secondary | ICD-10-CM

## 2020-04-23 DIAGNOSIS — Z9119 Patient's noncompliance with other medical treatment and regimen: Secondary | ICD-10-CM

## 2020-04-23 DIAGNOSIS — F411 Generalized anxiety disorder: Secondary | ICD-10-CM | POA: Diagnosis not present

## 2020-05-01 ENCOUNTER — Ambulatory Visit (INDEPENDENT_AMBULATORY_CARE_PROVIDER_SITE_OTHER): Payer: BC Managed Care – PPO | Admitting: Family Medicine

## 2020-05-01 ENCOUNTER — Encounter (INDEPENDENT_AMBULATORY_CARE_PROVIDER_SITE_OTHER): Payer: Self-pay | Admitting: Family Medicine

## 2020-05-01 ENCOUNTER — Other Ambulatory Visit: Payer: Self-pay

## 2020-05-01 VITALS — BP 126/79 | HR 74 | Temp 98.1°F | Ht 70.0 in | Wt 323.0 lb

## 2020-05-01 DIAGNOSIS — E559 Vitamin D deficiency, unspecified: Secondary | ICD-10-CM

## 2020-05-01 DIAGNOSIS — Z9189 Other specified personal risk factors, not elsewhere classified: Secondary | ICD-10-CM

## 2020-05-01 DIAGNOSIS — R7303 Prediabetes: Secondary | ICD-10-CM | POA: Diagnosis not present

## 2020-05-01 DIAGNOSIS — Z6841 Body Mass Index (BMI) 40.0 and over, adult: Secondary | ICD-10-CM

## 2020-05-01 MED ORDER — VITAMIN D (ERGOCALCIFEROL) 1.25 MG (50000 UNIT) PO CAPS: 50000.0000 [IU] | ORAL_CAPSULE | ORAL | 0 refills | Status: DC

## 2020-05-01 NOTE — Progress Notes (Addendum)
Chief Complaint:   OBESITY Bryan [name] is here to discuss his progress with his obesity treatment plan along with follow-up of his obesity related diagnoses. Bryan [name] is on keeping a food journal and adhering to recommended goals of 1500-1800 calories and 100+ grams of protein daily and states he is following his eating plan approximately 60% of the time. Bryan [name] states he is walking for 45 minutes 6 times per week.  Today's visit was #: 10 Starting weight: 326 lbs Starting date: 09/08/2017 Today's weight: 323 lbs Today's date: 05/01/2020 Total lbs lost to date: 3 Total lbs lost since last in-office visit: 0  Interim History: [place] had a large meeting, he celebrated his anniversary, and the family celebrated his son's college graduation. He strayed from the plan during celebration eating and he is ready to get back on the plan.  Subjective:   1. Vitamin D deficiency Bryan [name]'s Vit D level on 01/10/2020 was 39.2. He is on prescription strength Vit D supplementation, and is tolerating it well.  2. Pre-diabetes Bryan [name]'s A1c on 01/10/2020 was 6.4. He is on metformin TID, and is tolerating it well.  3. At risk for heart disease Bryan [name] is at a higher than average risk for cardiovascular disease due to obesity, hyperlipidemia, hypertension, and pre-diabetes.   Assessment/Plan:   1. Vitamin D deficiency Low Vitamin D level contributes to fatigue and are associated with obesity, breast, and colon cancer. We will refill prescription Vitamin D for 1 month. Bryan [name] will follow-up for routine testing of Vitamin D, at least 2-3 times per year to avoid over-replacement.  - Vitamin D, Ergocalciferol, (DRISDOL) 1.25 MG (50000 UNIT) CAPS capsule; Take 1 capsule (50,000 Units total) by mouth every 7 (seven) days.  Dispense: 4 capsule; Refill: 0  2. Pre-diabetes Bryan [name] will continue with metformin as directed, and will continue his Category 3 meal plan. He will continue to work on weight  loss, exercise, and decreasing simple carbohydrates to help decrease the risk of diabetes.   3. At risk for heart disease Bryan [name] was given approximately 15 minutes of coronary artery disease prevention counseling today. He is 59 y.o. male and has risk factors for heart disease including obesity, hyperlipidemia, hypertension, and pre-diabetes. We discussed intensive lifestyle modifications today with an emphasis on specific weight loss instructions and strategies.   Repetitive spaced learning was employed today to elicit superior memory formation and behavioral change.  4. Class 3 severe obesity with serious comorbidity and body mass index (BMI) of 45.0 to 49.9 in adult, unspecified obesity type (HCC) Bryan [name] is currently in the action stage of change. As such, his goal is to continue with weight loss efforts. He has agreed to the Category 3 Plan.   Exercise goals: As is.  Behavioral modification strategies: increasing lean protein intake, decreasing simple carbohydrates, meal planning and cooking strategies and celebration eating strategies.  Bryan [name] has agreed to follow-up with our clinic in 3 to 4 weeks. He was informed of the importance of frequent follow-up visits to maximize his success with intensive lifestyle modifications for his multiple health conditions.   Objective:   Blood pressure 126/79, pulse 74, temperature 98.1 F (36.7 C), temperature source Oral, height 5\' 10"  (1.778 m), weight (!) 323 lb (146.5 kg), SpO2 96 %. Body mass index is 46.35 kg/m.  General: Cooperative, alert, well developed, in no acute distress. HEENT: Conjunctivae and lids unremarkable. Cardiovascular: Regular rhythm.  Lungs: Normal work of breathing. Neurologic: No focal deficits.   Lab Results  Component Value  Date   CREATININE 1.01 01/10/2020   BUN 15 01/10/2020   NA 141 01/10/2020   K 3.9 01/10/2020   CL 100 01/10/2020   CO2 24 01/10/2020   Lab Results  Component Value Date   ALT 27  01/10/2020   AST 17 01/10/2020   ALKPHOS 57 01/10/2020   BILITOT 0.7 01/10/2020   Lab Results  Component Value Date   HGBA1C 6.4 (H) 01/10/2020   HGBA1C 6.2 07/07/2019   HGBA1C 5.9 (H) 11/17/2018   HGBA1C 5.9 (H) 08/18/2018   HGBA1C 5.9 (H) 02/17/2018   Lab Results  Component Value Date   INSULIN 24.0 01/10/2020   INSULIN 21.9 11/17/2018   INSULIN 24.0 08/18/2018   INSULIN 33.0 (H) 02/17/2018   INSULIN 19.4 01/05/2018   Lab Results  Component Value Date   TSH 1.560 01/10/2020   Lab Results  Component Value Date   CHOL 143 01/10/2020   HDL 41 01/10/2020   LDLCALC 72 01/10/2020   LDLDIRECT 136.0 11/16/2016   TRIG 180 (H) 01/10/2020   CHOLHDL 4 07/07/2019   Lab Results  Component Value Date   WBC 5.5 07/07/2019   HGB 15.3 07/07/2019   HCT 46.1 07/07/2019   MCV 86.1 07/07/2019   PLT 252.0 07/07/2019   No results found for: IRON, TIBC, FERRITIN  Attestation Statements:   Reviewed by clinician on day of visit: allergies, medications, problem list, medical history, surgical history, family history, social history, and previous encounter notes.   I, Trixie Dredge, am acting as transcriptionist for Dennard Nip, MD.  I have reviewed the above documentation for accuracy and completeness, and I agree with the above. Dennard Nip, MD

## 2020-05-02 NOTE — Progress Notes (Signed)
Chief Complaint:   OBESITY Bryan [name] is here to discuss his progress with his obesity treatment plan along with follow-up of his obesity related diagnoses. Bryan [name] is on keeping a food journal and adhering to recommended goals of 1500-1800 calories and 100+ grams of protein daily and states he is following his eating plan approximately 60% of the time. Bryan [name] states he is walking for 45 minutes 6 times per week.  Today's visit was #: 74 Starting weight: 326 lbs Starting date: 09/08/2017 Today's weight: 323 lbs Today's date: 05/01/2020 Total lbs lost to date: 3 Total lbs lost since last in-office visit: 0  Interim History: Bryan [name] had a large meeting, he celebrated his anniversary, and the family celebrated his son's college graduation. He strayed from the plan during celebration eating and he is ready to get back on the plan.  Subjective:   1. Vitamin D deficiency Bryan [name]'s Vit D level on 01/10/2020 was 39.2. He is on prescription strength Vit D supplementation, and is tolerating it well.  2. Pre-diabetes Bryan [name]'s A1c on 01/10/2020 was 6.4. He is on metformin TID, and is tolerating it well.  3. At risk for heart disease Bryan [name] is at a higher than average risk for cardiovascular disease due to obesity, hyperlipidemia, hypertension, and pre-diabetes.   Assessment/Plan:   1. Vitamin D deficiency Low Vitamin D level contributes to fatigue and are associated with obesity, breast, and colon cancer. We will refill prescription Vitamin D for 1 month. Bryan [name] will follow-up for routine testing of Vitamin D, at least 2-3 times per year to avoid over-replacement.  - Vitamin D, Ergocalciferol, (DRISDOL) 1.25 MG (50000 UNIT) CAPS capsule; Take 1 capsule (50,000 Units total) by mouth every 7 (seven) days.  Dispense: 4 capsule; Refill: 0  2. Pre-diabetes Bryan [name] will continue with metformin as directed, and will continue his Category 3 meal plan. He will continue to work on weight  loss, exercise, and decreasing simple carbohydrates to help decrease the risk of diabetes.   3. At risk for heart disease Bryan [name] was given approximately 15 minutes of coronary artery disease prevention counseling today. He is 59 y.o. male and has risk factors for heart disease including obesity, hyperlipidemia, hypertension, and pre-diabetes. We discussed intensive lifestyle modifications today with an emphasis on specific weight loss instructions and strategies.   Repetitive spaced learning was employed today to elicit superior memory formation and behavioral change.  4. Class 3 severe obesity with serious comorbidity and body mass index (BMI) of 45.0 to 49.9 in adult, unspecified obesity type (HCC) Bryan [name] is currently in the action stage of change. As such, his goal is to continue with weight loss efforts. He has agreed to the Category 3 Plan.   Exercise goals: As is.  Behavioral modification strategies: increasing lean protein intake, decreasing simple carbohydrates, meal planning and cooking strategies and celebration eating strategies.  Bryan [name] has agreed to follow-up with our clinic in 3 to 4 weeks. He was informed of the importance of frequent follow-up visits to maximize his success with intensive lifestyle modifications for his multiple health conditions.   Objective:   Blood pressure 126/79, pulse 74, temperature 98.1 F (36.7 C), temperature source Oral, height 5\' 10"  (1.778 m), weight (!) 323 lb (146.5 kg), SpO2 96 %. Body mass index is 46.35 kg/m.  General: Cooperative, alert, well developed, in no acute distress. HEENT: Conjunctivae and lids unremarkable. Cardiovascular: Regular rhythm.  Lungs: Normal work of breathing. Neurologic: No focal deficits.   Lab Results  Component Value  Date   CREATININE 1.01 01/10/2020   BUN 15 01/10/2020   NA 141 01/10/2020   K 3.9 01/10/2020   CL 100 01/10/2020   CO2 24 01/10/2020   Lab Results  Component Value Date   ALT 27  01/10/2020   AST 17 01/10/2020   ALKPHOS 57 01/10/2020   BILITOT 0.7 01/10/2020   Lab Results  Component Value Date   HGBA1C 6.4 (H) 01/10/2020   HGBA1C 6.2 07/07/2019   HGBA1C 5.9 (H) 11/17/2018   HGBA1C 5.9 (H) 08/18/2018   HGBA1C 5.9 (H) 02/17/2018   Lab Results  Component Value Date   INSULIN 24.0 01/10/2020   INSULIN 21.9 11/17/2018   INSULIN 24.0 08/18/2018   INSULIN 33.0 (H) 02/17/2018   INSULIN 19.4 01/05/2018   Lab Results  Component Value Date   TSH 1.560 01/10/2020   Lab Results  Component Value Date   CHOL 143 01/10/2020   HDL 41 01/10/2020   LDLCALC 72 01/10/2020   LDLDIRECT 136.0 11/16/2016   TRIG 180 (H) 01/10/2020   CHOLHDL 4 07/07/2019   Lab Results  Component Value Date   WBC 5.5 07/07/2019   HGB 15.3 07/07/2019   HCT 46.1 07/07/2019   MCV 86.1 07/07/2019   PLT 252.0 07/07/2019   No results found for: IRON, TIBC, FERRITIN  Attestation Statements:   Reviewed by clinician on day of visit: allergies, medications, problem list, medical history, surgical history, family history, social history, and previous encounter notes.   I, Trixie Dredge, am acting as transcriptionist for Dennard Nip, MD.  I have reviewed the above documentation for accuracy and completeness, and I agree with the above. Dennard Nip, MD

## 2020-05-24 ENCOUNTER — Ambulatory Visit (INDEPENDENT_AMBULATORY_CARE_PROVIDER_SITE_OTHER): Payer: BC Managed Care – PPO | Admitting: Psychologist

## 2020-05-24 DIAGNOSIS — Z630 Problems in relationship with spouse or partner: Secondary | ICD-10-CM | POA: Diagnosis not present

## 2020-05-24 DIAGNOSIS — F411 Generalized anxiety disorder: Secondary | ICD-10-CM | POA: Diagnosis not present

## 2020-05-24 DIAGNOSIS — Z9119 Patient's noncompliance with other medical treatment and regimen: Secondary | ICD-10-CM | POA: Diagnosis not present

## 2020-05-29 ENCOUNTER — Encounter (INDEPENDENT_AMBULATORY_CARE_PROVIDER_SITE_OTHER): Payer: Self-pay | Admitting: Family Medicine

## 2020-05-29 ENCOUNTER — Other Ambulatory Visit: Payer: Self-pay

## 2020-05-29 ENCOUNTER — Ambulatory Visit (INDEPENDENT_AMBULATORY_CARE_PROVIDER_SITE_OTHER): Payer: BC Managed Care – PPO | Admitting: Family Medicine

## 2020-05-29 VITALS — BP 132/78 | HR 72 | Temp 97.9°F | Ht 70.0 in | Wt 326.0 lb

## 2020-05-29 DIAGNOSIS — E559 Vitamin D deficiency, unspecified: Secondary | ICD-10-CM

## 2020-05-29 DIAGNOSIS — Z9189 Other specified personal risk factors, not elsewhere classified: Secondary | ICD-10-CM

## 2020-05-29 DIAGNOSIS — F3289 Other specified depressive episodes: Secondary | ICD-10-CM

## 2020-05-29 DIAGNOSIS — Z6841 Body Mass Index (BMI) 40.0 and over, adult: Secondary | ICD-10-CM

## 2020-05-29 DIAGNOSIS — R7303 Prediabetes: Secondary | ICD-10-CM

## 2020-05-29 MED ORDER — VITAMIN D (ERGOCALCIFEROL) 1.25 MG (50000 UNIT) PO CAPS: 50000.0000 [IU] | ORAL_CAPSULE | ORAL | 0 refills | Status: DC

## 2020-05-29 MED ORDER — BUPROPION HCL ER (SR) 150 MG PO TB12: 150.0000 mg | ORAL_TABLET | Freq: Every morning | ORAL | 0 refills | Status: DC

## 2020-05-29 MED ORDER — METFORMIN HCL 500 MG PO TABS: 500.0000 mg | ORAL_TABLET | Freq: Three times a day (TID) | ORAL | 0 refills | Status: DC

## 2020-05-30 LAB — COMPREHENSIVE METABOLIC PANEL
ALT: 31 IU/L (ref 0–44)
AST: 19 IU/L (ref 0–40)
Albumin/Globulin Ratio: 1.7 (ref 1.2–2.2)
Albumin: 4.5 g/dL (ref 3.8–4.9)
Alkaline Phosphatase: 54 IU/L (ref 48–121)
BUN/Creatinine Ratio: 17 (ref 9–20)
BUN: 17 mg/dL (ref 6–24)
Bilirubin Total: 0.4 mg/dL (ref 0.0–1.2)
CO2: 23 mmol/L (ref 20–29)
Calcium: 9.4 mg/dL (ref 8.7–10.2)
Chloride: 100 mmol/L (ref 96–106)
Creatinine, Ser: 1.01 mg/dL (ref 0.76–1.27)
GFR calc Af Amer: 96 mL/min/{1.73_m2} (ref >59)
GFR calc non Af Amer: 83 mL/min/{1.73_m2} (ref >59)
Globulin, Total: 2.7 g/dL (ref 1.5–4.5)
Glucose: 118 mg/dL — ABNORMAL HIGH (ref 65–99)
Potassium: 4.3 mmol/L (ref 3.5–5.2)
Sodium: 140 mmol/L (ref 134–144)
Total Protein: 7.2 g/dL (ref 6.0–8.5)

## 2020-05-30 LAB — LIPID PANEL WITH LDL/HDL RATIO
Cholesterol, Total: 179 mg/dL (ref 100–199)
HDL: 43 mg/dL (ref >39)
LDL Chol Calc (NIH): 97 mg/dL (ref 0–99)
LDL/HDL Ratio: 2.3 ratio (ref 0.0–3.6)
Triglycerides: 232 mg/dL — ABNORMAL HIGH (ref 0–149)
VLDL Cholesterol Cal: 39 mg/dL (ref 5–40)

## 2020-05-30 LAB — INSULIN, RANDOM: INSULIN: 20.9 u[IU]/mL (ref 2.6–24.9)

## 2020-05-30 LAB — HEMOGLOBIN A1C
Est. average glucose Bld gHb Est-mCnc: 134 mg/dL
Hgb A1c MFr Bld: 6.3 % — ABNORMAL HIGH (ref 4.8–5.6)

## 2020-05-30 LAB — VITAMIN D 25 HYDROXY (VIT D DEFICIENCY, FRACTURES): Vit D, 25-Hydroxy: 43.6 ng/mL (ref 30.0–100.0)

## 2020-05-30 NOTE — Progress Notes (Signed)
Chief Complaint:   OBESITY Bryan [name] is here to discuss his progress with his obesity treatment plan along with follow-up of his obesity related diagnoses. Bryan [name] is on the Category 3 Plan and states he is following his eating plan approximately 60% of the time. Bryan [name] states he is walking for 45 minutes 6 times per week.  Today's visit was #: 72 Starting weight: 326 lbs Starting date: 09/08/2017 Today's weight: 326 lbs Today's date: 05/29/2020 Total lbs lost to date: 0 Total lbs lost since last in-office visit: 0  Interim History: Bryan [name] has been struggling more with his eating plan recently but thinks his life is settling down now, and is ready to get back on track.  Subjective:   1. Vitamin D deficiency Garrett's Vit D level is not yet at goal. He denies nausea or vomiting.  2. Pre-diabetes Bryan [name] is stable on metformin, and he is due for labs.  3. Other depression with emotional eating  Bryan [name] has been struggling with some stress eating and notes increased cravings.  4. At risk for diabetes mellitus Bryan [name] is at higher than average risk for developing diabetes due to his obesity.   Assessment/Plan:   1. Vitamin D deficiency Low Vitamin D level contributes to fatigue and are associated with obesity, breast, and colon cancer. We will check labs today, and we will refill prescription Vitamin D for 1 month. Bryan [name] will follow-up for routine testing of Vitamin D, at least 2-3 times per year to avoid over-replacement.  - VITAMIN D 25 Hydroxy (Vit-D Deficiency, Fractures)  - Vitamin D, Ergocalciferol, (DRISDOL) 1.25 MG (50000 UNIT) CAPS capsule; Take 1 capsule (50,000 Units total) by mouth every 7 (seven) days.  Dispense: 4 capsule; Refill: 0  2. Pre-diabetes Bryan [name] will continue to work on weight loss, exercise, and decreasing simple carbohydrates to help decrease the risk of diabetes. We will check labs today, and we will refill metformin for 1 month.  - Hemoglobin  A1c - Insulin, random - Lipid Panel With LDL/HDL Ratio  - metFORMIN (GLUCOPHAGE) 500 MG tablet; Take 1 tablet (500 mg total) by mouth 3 (three) times daily.  Dispense: 90 tablet; Refill: 0  3. Other depression with emotional eating  Behavior modification techniques were discussed today to help Bryan [name] deal with his emotional/non-hunger eating behaviors. Bryan [name] agreed to start Wellbutrin SR 150 mg q daily with no refills. Orders and follow up as documented in patient record.   - buPROPion (WELLBUTRIN SR) 150 MG 12 hr tablet; Take 1 tablet (150 mg total) by mouth in the morning.  Dispense: 30 tablet; Refill: 0  4. At risk for diabetes mellitus Bryan [name] was given approximately 15 minutes of diabetes education and counseling today. We discussed intensive lifestyle modifications today with an emphasis on weight loss as well as increasing exercise and decreasing simple carbohydrates in his diet. We also reviewed medication options with an emphasis on risk versus benefit of those discussed.   Repetitive spaced learning was employed today to elicit superior memory formation and behavioral change.  5. Class 3 severe obesity with serious comorbidity and body mass index (BMI) of 45.0 to 49.9 in adult, unspecified obesity type (HCC) [place] is currently in the action stage of change. As such, his goal is to continue with weight loss efforts. He has agreed to the Category 3 Plan.   Exercise goals: As is.  Behavioral modification strategies: meal planning and cooking strategies and emotional eating strategies.  Bryan [name] has agreed to follow-up with our clinic in  3 weeks. He was informed of the importance of frequent follow-up visits to maximize his success with intensive lifestyle modifications for his multiple health conditions.   Bryan [name] was informed we would discuss his lab results at his next visit unless there is a critical issue that needs to be addressed sooner. Bryan [name] agreed to keep his next visit  at the agreed upon time to discuss these results.  Objective:   Blood pressure 132/78, pulse 72, temperature 97.9 F (36.6 C), temperature source Oral, height 5\' 10"  (1.778 m), weight (!) 326 lb (147.9 kg), SpO2 96 %. Body mass index is 46.78 kg/m.  General: Cooperative, alert, well developed, in no acute distress. HEENT: Conjunctivae and lids unremarkable. Cardiovascular: Regular rhythm.  Lungs: Normal work of breathing. Neurologic: No focal deficits.   Lab Results  Component Value Date   CREATININE 1.01 05/29/2020   BUN 17 05/29/2020   NA 140 05/29/2020   K 4.3 05/29/2020   CL 100 05/29/2020   CO2 23 05/29/2020   Lab Results  Component Value Date   ALT 31 05/29/2020   AST 19 05/29/2020   ALKPHOS 54 05/29/2020   BILITOT 0.4 05/29/2020   Lab Results  Component Value Date   HGBA1C 6.3 (H) 05/29/2020   HGBA1C 6.4 (H) 01/10/2020   HGBA1C 6.2 07/07/2019   HGBA1C 5.9 (H) 11/17/2018   HGBA1C 5.9 (H) 08/18/2018   Lab Results  Component Value Date   INSULIN 20.9 05/29/2020   INSULIN 24.0 01/10/2020   INSULIN 21.9 11/17/2018   INSULIN 24.0 08/18/2018   INSULIN 33.0 (H) 02/17/2018   Lab Results  Component Value Date   TSH 1.560 01/10/2020   Lab Results  Component Value Date   CHOL 179 05/29/2020   HDL 43 05/29/2020   LDLCALC 97 05/29/2020   LDLDIRECT 136.0 11/16/2016   TRIG 232 (H) 05/29/2020   CHOLHDL 4 07/07/2019   Lab Results  Component Value Date   WBC 5.5 07/07/2019   HGB 15.3 07/07/2019   HCT 46.1 07/07/2019   MCV 86.1 07/07/2019   PLT 252.0 07/07/2019   No results found for: IRON, TIBC, FERRITIN  Attestation Statements:   Reviewed by clinician on day of visit: allergies, medications, problem list, medical history, surgical history, family history, social history, and previous encounter notes.    I, Trixie Dredge, am acting as transcriptionist for Dennard Nip, MD.  I have reviewed the above documentation for accuracy and completeness, and I  agree with the above. -  Dennard Nip, MD

## 2020-06-19 ENCOUNTER — Ambulatory Visit (INDEPENDENT_AMBULATORY_CARE_PROVIDER_SITE_OTHER): Payer: BC Managed Care – PPO | Admitting: Family Medicine

## 2020-06-19 ENCOUNTER — Other Ambulatory Visit: Payer: Self-pay

## 2020-06-19 ENCOUNTER — Encounter (INDEPENDENT_AMBULATORY_CARE_PROVIDER_SITE_OTHER): Payer: Self-pay | Admitting: Family Medicine

## 2020-06-19 VITALS — BP 138/79 | HR 82 | Temp 98.6°F | Ht 70.0 in | Wt 325.0 lb

## 2020-06-19 DIAGNOSIS — Z9189 Other specified personal risk factors, not elsewhere classified: Secondary | ICD-10-CM

## 2020-06-19 DIAGNOSIS — Z6841 Body Mass Index (BMI) 40.0 and over, adult: Secondary | ICD-10-CM

## 2020-06-19 DIAGNOSIS — E559 Vitamin D deficiency, unspecified: Secondary | ICD-10-CM

## 2020-06-19 DIAGNOSIS — F3289 Other specified depressive episodes: Secondary | ICD-10-CM | POA: Diagnosis not present

## 2020-06-19 MED ORDER — VITAMIN D (ERGOCALCIFEROL) 1.25 MG (50000 UNIT) PO CAPS: 50000.0000 [IU] | ORAL_CAPSULE | ORAL | 0 refills | Status: DC

## 2020-06-19 MED ORDER — BUPROPION HCL ER (SR) 150 MG PO TB12: 150.0000 mg | ORAL_TABLET | Freq: Every morning | ORAL | 0 refills | Status: DC

## 2020-06-20 NOTE — Progress Notes (Signed)
Chief Complaint:   OBESITY Bryan [name] is here to discuss his progress with his obesity treatment plan along with follow-up of his obesity related diagnoses. Bryan [name] is on the Category 3 Plan and states he is following his eating plan approximately 65% of the time. Bryan [name] states he is walking for 50 minutes 3 times per week, and playing pickleball for 60 minutes 1 time per week.  Today's visit was #: 28 Starting weight: 326 lbs Starting date: 09/08/2017 Today's weight: 325 lbs Today's date: 06/19/2020 Total lbs lost to date: 1 Total lbs lost since last in-office visit: 1  Interim History: Bryan [name] continues to lose weight on his Category 3 plan, even with increased celebration eating.  Subjective:   1. Vitamin D deficiency Bryan [name]'s Vit D level is slowly improving but not yet at goal.  2. Other depression with emotional eating Bryan [name]'s mood is stable on Wellbutrin, and he is working on diet and exercise.  3. At risk for dehydration Bryan [name] is at risk for dehydration due to inadequate water.  Assessment/Plan:   1. Vitamin D deficiency Low Vitamin D level contributes to fatigue and are associated with obesity, breast, and colon cancer. We will refill prescription Vitamin D for 1 month. Bryan [name] will follow-up for routine testing of Vitamin D, at least 2-3 times per year to avoid over-replacement.  - Vitamin D, Ergocalciferol, (DRISDOL) 1.25 MG (50000 UNIT) CAPS capsule; Take 1 capsule (50,000 Units total) by mouth every 7 (seven) days.  Dispense: 4 capsule; Refill: 0  2. Other depression with emotional eating Behavior modification techniques were discussed today to help Bryan [name] deal with his emotional/non-hunger eating behaviors. We will  refill Wellbutrin SR for 1 month. Orders and follow up as documented in patient record.   - buPROPion (WELLBUTRIN SR) 150 MG 12 hr tablet; Take 1 tablet (150 mg total) by mouth in the morning.  Dispense: 30 tablet; Refill: 0  3. At risk for  dehydration Bryan [name] was given approximately 15 minutes dehydration prevention counseling today. Bryan [name] is at risk for dehydration due to weight loss and current medication(s). He was encouraged to hydrate and monitor fluid status to avoid dehydration as well as weight loss plateaus.   4. Class 3 severe obesity with serious comorbidity and body mass index (BMI) of 45.0 to 49.9 in adult, unspecified obesity type (HCC) Bryan [name] is currently in the action stage of change. As such, his goal is to continue with weight loss efforts. He has agreed to the Category 3 Plan.   Exercise goals: As is.  Behavioral modification strategies: increasing lean protein intake and increasing water intake.  Bryan [name] has agreed to follow-up with our clinic in 3 weeks. He was informed of the importance of frequent follow-up visits to maximize his success with intensive lifestyle modifications for his multiple health conditions.   Objective:   Blood pressure 138/79, pulse 82, temperature 98.6 F (37 C), temperature source Oral, height 5\' 10"  (1.778 m), weight (!) 325 lb (147.4 kg), SpO2 97 %. Body mass index is 46.63 kg/m.  General: Cooperative, alert, well developed, in no acute distress. HEENT: Conjunctivae and lids unremarkable. Cardiovascular: Regular rhythm.  Lungs: Normal work of breathing. Neurologic: No focal deficits.   Lab Results  Component Value Date   CREATININE 1.01 05/29/2020   BUN 17 05/29/2020   NA 140 05/29/2020   K 4.3 05/29/2020   CL 100 05/29/2020   CO2 23 05/29/2020   Lab Results  Component Value Date   ALT 31 05/29/2020  AST 19 05/29/2020   ALKPHOS 54 05/29/2020   BILITOT 0.4 05/29/2020   Lab Results  Component Value Date   HGBA1C 6.3 (H) 05/29/2020   HGBA1C 6.4 (H) 01/10/2020   HGBA1C 6.2 07/07/2019   HGBA1C 5.9 (H) 11/17/2018   HGBA1C 5.9 (H) 08/18/2018   Lab Results  Component Value Date   INSULIN 20.9 05/29/2020   INSULIN 24.0 01/10/2020   INSULIN 21.9 11/17/2018     INSULIN 24.0 08/18/2018   INSULIN 33.0 (H) 02/17/2018   Lab Results  Component Value Date   TSH 1.560 01/10/2020   Lab Results  Component Value Date   CHOL 179 05/29/2020   HDL 43 05/29/2020   LDLCALC 97 05/29/2020   LDLDIRECT 136.0 11/16/2016   TRIG 232 (H) 05/29/2020   CHOLHDL 4 07/07/2019   Lab Results  Component Value Date   WBC 5.5 07/07/2019   HGB 15.3 07/07/2019   HCT 46.1 07/07/2019   MCV 86.1 07/07/2019   PLT 252.0 07/07/2019   No results found for: IRON, TIBC, FERRITIN  Attestation Statements:   Reviewed by clinician on day of visit: allergies, medications, problem list, medical history, surgical history, family history, social history, and previous encounter notes.   I, Trixie Dredge, am acting as transcriptionist for Dennard Nip, MD.  I have reviewed the above documentation for accuracy and completeness, and I agree with the above. -  Dennard Nip, MD

## 2020-06-23 ENCOUNTER — Other Ambulatory Visit (INDEPENDENT_AMBULATORY_CARE_PROVIDER_SITE_OTHER): Payer: Self-pay | Admitting: Family Medicine

## 2020-06-23 DIAGNOSIS — R7303 Prediabetes: Secondary | ICD-10-CM

## 2020-06-28 ENCOUNTER — Ambulatory Visit (INDEPENDENT_AMBULATORY_CARE_PROVIDER_SITE_OTHER): Payer: BC Managed Care – PPO | Admitting: Psychologist

## 2020-06-28 DIAGNOSIS — Z9119 Patient's noncompliance with other medical treatment and regimen: Secondary | ICD-10-CM | POA: Diagnosis not present

## 2020-06-28 DIAGNOSIS — Z630 Problems in relationship with spouse or partner: Secondary | ICD-10-CM

## 2020-06-28 DIAGNOSIS — F411 Generalized anxiety disorder: Secondary | ICD-10-CM

## 2020-07-10 ENCOUNTER — Ambulatory Visit (INDEPENDENT_AMBULATORY_CARE_PROVIDER_SITE_OTHER): Payer: BC Managed Care – PPO | Admitting: Family Medicine

## 2020-07-10 ENCOUNTER — Encounter (INDEPENDENT_AMBULATORY_CARE_PROVIDER_SITE_OTHER): Payer: Self-pay | Admitting: Family Medicine

## 2020-07-10 ENCOUNTER — Other Ambulatory Visit: Payer: Self-pay

## 2020-07-10 VITALS — BP 135/78 | HR 95 | Temp 98.4°F | Ht 70.0 in | Wt 321.0 lb

## 2020-07-10 DIAGNOSIS — E559 Vitamin D deficiency, unspecified: Secondary | ICD-10-CM | POA: Diagnosis not present

## 2020-07-10 DIAGNOSIS — F3289 Other specified depressive episodes: Secondary | ICD-10-CM | POA: Diagnosis not present

## 2020-07-10 DIAGNOSIS — Z9189 Other specified personal risk factors, not elsewhere classified: Secondary | ICD-10-CM | POA: Diagnosis not present

## 2020-07-10 DIAGNOSIS — Z6841 Body Mass Index (BMI) 40.0 and over, adult: Secondary | ICD-10-CM

## 2020-07-10 MED ORDER — VITAMIN D (ERGOCALCIFEROL) 1.25 MG (50000 UNIT) PO CAPS: 50000.0000 [IU] | ORAL_CAPSULE | ORAL | 0 refills | Status: DC

## 2020-07-10 MED ORDER — BUPROPION HCL ER (SR) 150 MG PO TB12: 150.0000 mg | ORAL_TABLET | Freq: Every morning | ORAL | 0 refills | Status: DC

## 2020-07-11 NOTE — Progress Notes (Signed)
Chief Complaint:   OBESITY Bryan [name] is here to discuss his progress with his obesity treatment plan along with follow-up of his obesity related diagnoses. Bryan [name] is on the Category 3 Plan and states he is following his eating plan approximately 65% of the time. Bryan [name] states he is walking for 45 minutes 3-4 times per week, and playing pickle ball for 60 minutes 1 time per week.  Today's visit was #: 81 Starting weight: 326 lbs Starting date: 09/08/2017 Today's weight: 321 lbs Today's date: 07/10/2020 Total lbs lost to date: 5 Total lbs lost since last in-office visit: 4  Interim History: Bryan [name] continues to do well with weight loss. He is going on vacation soon and he wants to try to damage control his celebration eating.  Subjective:   1. Vitamin D deficiency Bryan [name] is stable on Vit D, and he denies nausea or vomiting.  2. Other depression with emotional eating Bryan [name] continues to do well with decreasing emotional eating and making strategies to do well.  3. At risk for heart disease Bryan [name] is at a higher than average risk for cardiovascular disease due to obesity.   Assessment/Plan:   1. Vitamin D deficiency Low Vitamin D level contributes to fatigue and are associated with obesity, breast, and colon cancer. We will refill prescription Vitamin D for 1 month. Bryan [name] will follow-up for routine testing of Vitamin D, at least 2-3 times per year to avoid over-replacement.  - Vitamin D, Ergocalciferol, (DRISDOL) 1.25 MG (50000 UNIT) CAPS capsule; Take 1 capsule (50,000 Units total) by mouth every 7 (seven) days.  Dispense: 4 capsule; Refill: 0  2. Other depression with emotional eating Behavior modification techniques were discussed today to help Bryan [name] deal with his emotional/non-hunger eating behaviors. We will refill Wellbutrin SR for 1 month. Orders and follow up as documented in patient record.   - buPROPion (WELLBUTRIN SR) 150 MG 12 hr tablet; Take 1 tablet (150 mg  total) by mouth in the morning.  Dispense: 30 tablet; Refill: 0  3. At risk for heart disease Bryan [name] was given approximately 15 minutes of coronary artery disease prevention counseling today. He is 59 y.o. male and has risk factors for heart disease including obesity. We discussed intensive lifestyle modifications today with an emphasis on specific weight loss instructions and strategies.   Repetitive spaced learning was employed today to elicit superior memory formation and behavioral change.  4. Class 3 severe obesity with serious comorbidity and body mass index (BMI) of 45.0 to 49.9 in adult, unspecified obesity type (HCC) Bryan [name] is currently in the action stage of change. As such, his goal is to continue with weight loss efforts. He has agreed to the Category 3 Plan.   Exercise goals: As is.  Behavioral modification strategies: holiday eating strategies  and celebration eating strategies.  Bryan [name] has agreed to follow-up with our clinic in 3 weeks. He was informed of the importance of frequent follow-up visits to maximize his success with intensive lifestyle modifications for his multiple health conditions.   Objective:   Blood pressure 135/78, pulse 95, temperature 98.4 F (36.9 C), temperature source Oral, height 5\' 10"  (1.778 m), weight (!) 321 lb (145.6 kg), SpO2 96 %. Body mass index is 46.06 kg/m.  General: Cooperative, alert, well developed, in no acute distress. HEENT: Conjunctivae and lids unremarkable. Cardiovascular: Regular rhythm.  Lungs: Normal work of breathing. Neurologic: No focal deficits.   Lab Results  Component Value Date   CREATININE 1.01 05/29/2020   BUN 17  05/29/2020   NA 140 05/29/2020   K 4.3 05/29/2020   CL 100 05/29/2020   CO2 23 05/29/2020   Lab Results  Component Value Date   ALT 31 05/29/2020   AST 19 05/29/2020   ALKPHOS 54 05/29/2020   BILITOT 0.4 05/29/2020   Lab Results  Component Value Date   HGBA1C 6.3 (H) 05/29/2020   HGBA1C  6.4 (H) 01/10/2020   HGBA1C 6.2 07/07/2019   HGBA1C 5.9 (H) 11/17/2018   HGBA1C 5.9 (H) 08/18/2018   Lab Results  Component Value Date   INSULIN 20.9 05/29/2020   INSULIN 24.0 01/10/2020   INSULIN 21.9 11/17/2018   INSULIN 24.0 08/18/2018   INSULIN 33.0 (H) 02/17/2018   Lab Results  Component Value Date   TSH 1.560 01/10/2020   Lab Results  Component Value Date   CHOL 179 05/29/2020   HDL 43 05/29/2020   LDLCALC 97 05/29/2020   LDLDIRECT 136.0 11/16/2016   TRIG 232 (H) 05/29/2020   CHOLHDL 4 07/07/2019   Lab Results  Component Value Date   WBC 5.5 07/07/2019   HGB 15.3 07/07/2019   HCT 46.1 07/07/2019   MCV 86.1 07/07/2019   PLT 252.0 07/07/2019   No results found for: IRON, TIBC, FERRITIN  Attestation Statements:   Reviewed by clinician on day of visit: allergies, medications, problem list, medical history, surgical history, family history, social history, and previous encounter notes.   I, Trixie Dredge, am acting as transcriptionist for Dennard Nip, MD.  I have reviewed the above documentation for accuracy and completeness, and I agree with the above. -  Dennard Nip, MD

## 2020-08-02 ENCOUNTER — Ambulatory Visit (INDEPENDENT_AMBULATORY_CARE_PROVIDER_SITE_OTHER): Payer: BC Managed Care – PPO | Admitting: Psychologist

## 2020-08-02 DIAGNOSIS — Z9119 Patient's noncompliance with other medical treatment and regimen: Secondary | ICD-10-CM | POA: Diagnosis not present

## 2020-08-02 DIAGNOSIS — Z630 Problems in relationship with spouse or partner: Secondary | ICD-10-CM | POA: Diagnosis not present

## 2020-08-02 DIAGNOSIS — F411 Generalized anxiety disorder: Secondary | ICD-10-CM

## 2020-08-05 ENCOUNTER — Ambulatory Visit (INDEPENDENT_AMBULATORY_CARE_PROVIDER_SITE_OTHER): Payer: BC Managed Care – PPO | Admitting: Family Medicine

## 2020-08-05 ENCOUNTER — Encounter (INDEPENDENT_AMBULATORY_CARE_PROVIDER_SITE_OTHER): Payer: Self-pay | Admitting: Family Medicine

## 2020-08-05 ENCOUNTER — Other Ambulatory Visit: Payer: Self-pay

## 2020-08-05 VITALS — BP 136/81 | HR 89 | Temp 97.9°F | Ht 70.0 in | Wt 324.0 lb

## 2020-08-05 DIAGNOSIS — R7303 Prediabetes: Secondary | ICD-10-CM | POA: Diagnosis not present

## 2020-08-05 DIAGNOSIS — F3289 Other specified depressive episodes: Secondary | ICD-10-CM

## 2020-08-05 DIAGNOSIS — E559 Vitamin D deficiency, unspecified: Secondary | ICD-10-CM

## 2020-08-05 DIAGNOSIS — Z9189 Other specified personal risk factors, not elsewhere classified: Secondary | ICD-10-CM | POA: Diagnosis not present

## 2020-08-05 DIAGNOSIS — Z6841 Body Mass Index (BMI) 40.0 and over, adult: Secondary | ICD-10-CM

## 2020-08-05 MED ORDER — VITAMIN D (ERGOCALCIFEROL) 1.25 MG (50000 UNIT) PO CAPS: 50000.0000 [IU] | ORAL_CAPSULE | ORAL | 0 refills | Status: DC

## 2020-08-05 MED ORDER — METFORMIN HCL 500 MG PO TABS: 500.0000 mg | ORAL_TABLET | Freq: Three times a day (TID) | ORAL | 0 refills | Status: DC

## 2020-08-05 MED ORDER — BUPROPION HCL ER (SR) 150 MG PO TB12: 150.0000 mg | ORAL_TABLET | Freq: Every morning | ORAL | 0 refills | Status: DC

## 2020-08-06 NOTE — Progress Notes (Signed)
Chief Complaint:   OBESITY Bryan [name] is here to discuss his progress with his obesity treatment plan along with follow-up of his obesity related diagnoses. Bryan [name] is on the Category 3 Plan and states he is following his eating plan approximately 40% of the time. Bryan [name] states he is walking for 45 minutes 2 times per week, and playing pickle ball 1 time per week.  Today's visit was #: 17 Starting weight: 326 lbs Starting date: 09/08/2017 Today's weight: 324 lbs Today's date: 08/05/2020 Total lbs lost to date: 2 Total lbs lost since last in-office visit: 0  Interim History: Bryan [name] had forgotten his hydrochlorothiazide for 3-4 days, and he was up in weight weight. He restarted his medications and most of the water retention is gone.  Subjective:   1. Pre-diabetes Bryan [name] is tolerating metformin well, and he denies nausea or vomiting.  2. Vitamin D deficiency Bryan [name] is stable on Vit D, but his level is not yet at goal.  3. Other depression with emotional eating Bryan [name] is tolerating Wellbutrin, and his blood pressure is stable.  4. At risk for dehydration Bryan [name] is at risk for dehydration due to increased heat and outdoor activities.  Assessment/Plan:   1. Pre-diabetes Bryan [name] will continue to work on weight loss, diet, exercise, and decreasing simple carbohydrates to help decrease the risk of diabetes. We will refill metformin for 1 month and we will recheck labs in 4-6 weeks.  - metFORMIN (GLUCOPHAGE) 500 MG tablet; Take 1 tablet (500 mg total) by mouth 3 (three) times daily.  Dispense: 90 tablet; Refill: 0  2. Vitamin D deficiency Low Vitamin D level contributes to fatigue and are associated with obesity, breast, and colon cancer. We will refill prescription Vitamin D for 1 month. Bryan [name] will follow-up for routine testing of Vitamin D, at least 2-3 times per year to avoid over-replacement. We will recheck labs in 4-6 weeks.  - Vitamin D, Ergocalciferol, (DRISDOL) 1.25  MG (50000 UNIT) CAPS capsule; Take 1 capsule (50,000 Units total) by mouth every 7 (seven) days.  Dispense: 4 capsule; Refill: 0  3. Other depression with emotional eating Behavior modification techniques were discussed today to help Bryan [name] deal with his emotional/non-hunger eating behaviors. We will refill Wellbutrin SR for 1 month Orders and follow up as documented in patient record.   - buPROPion (WELLBUTRIN SR) 150 MG 12 hr tablet; Take 1 tablet (150 mg total) by mouth in the morning.  Dispense: 30 tablet; Refill: 0  4. At risk for dehydration Bryan [name] was given approximately 15 minutes dehydration prevention counseling today. Bryan [name] is at risk for dehydration due to weight loss and current medication(s). He was encouraged to hydrate and monitor fluid status to avoid dehydration as well as weight loss plateaus.   5. Class 3 severe obesity with serious comorbidity and body mass index (BMI) of 45.0 to 49.9 in adult, unspecified obesity type (HCC) Bryan [name] is currently in the action stage of change. As such, his goal is to continue with weight loss efforts. He has agreed to the Category 3 Plan.   Exercise goals: As is.  Behavioral modification strategies: increasing water intake, decreasing sodium intake and holiday eating strategies .  Bryan [name] has agreed to follow-up with our clinic in 3 weeks. He was informed of the importance of frequent follow-up visits to maximize his success with intensive lifestyle modifications for his multiple health conditions.   Objective:   Blood pressure 136/81, pulse 89, temperature 97.9 F (36.6 C), height 5\' 10"  (1.778 m),  weight (!) 324 lb (147 kg), SpO2 97 %. Body mass index is 46.49 kg/m.  General: Cooperative, alert, well developed, in no acute distress. HEENT: Conjunctivae and lids unremarkable. Cardiovascular: Regular rhythm.  Lungs: Normal work of breathing. Neurologic: No focal deficits.   Lab Results  Component Value Date   CREATININE 1.01  05/29/2020   BUN 17 05/29/2020   NA 140 05/29/2020   K 4.3 05/29/2020   CL 100 05/29/2020   CO2 23 05/29/2020   Lab Results  Component Value Date   ALT 31 05/29/2020   AST 19 05/29/2020   ALKPHOS 54 05/29/2020   BILITOT 0.4 05/29/2020   Lab Results  Component Value Date   HGBA1C 6.3 (H) 05/29/2020   HGBA1C 6.4 (H) 01/10/2020   HGBA1C 6.2 07/07/2019   HGBA1C 5.9 (H) 11/17/2018   HGBA1C 5.9 (H) 08/18/2018   Lab Results  Component Value Date   INSULIN 20.9 05/29/2020   INSULIN 24.0 01/10/2020   INSULIN 21.9 11/17/2018   INSULIN 24.0 08/18/2018   INSULIN 33.0 (H) 02/17/2018   Lab Results  Component Value Date   TSH 1.560 01/10/2020   Lab Results  Component Value Date   CHOL 179 05/29/2020   HDL 43 05/29/2020   LDLCALC 97 05/29/2020   LDLDIRECT 136.0 11/16/2016   TRIG 232 (H) 05/29/2020   CHOLHDL 4 07/07/2019   Lab Results  Component Value Date   WBC 5.5 07/07/2019   HGB 15.3 07/07/2019   HCT 46.1 07/07/2019   MCV 86.1 07/07/2019   PLT 252.0 07/07/2019   No results found for: IRON, TIBC, FERRITIN  Attestation Statements:   Reviewed by clinician on day of visit: allergies, medications, problem list, medical history, surgical history, family history, social history, and previous encounter notes.   I, Trixie Dredge, am acting as transcriptionist for Dennard Nip, MD.  I have reviewed the above documentation for accuracy and completeness, and I agree with the above. -  Dennard Nip, MD

## 2020-08-09 ENCOUNTER — Ambulatory Visit (INDEPENDENT_AMBULATORY_CARE_PROVIDER_SITE_OTHER): Payer: BC Managed Care – PPO | Admitting: Psychologist

## 2020-08-09 DIAGNOSIS — F411 Generalized anxiety disorder: Secondary | ICD-10-CM | POA: Diagnosis not present

## 2020-08-09 DIAGNOSIS — Z630 Problems in relationship with spouse or partner: Secondary | ICD-10-CM

## 2020-08-09 DIAGNOSIS — Z9119 Patient's noncompliance with other medical treatment and regimen: Secondary | ICD-10-CM

## 2020-08-13 ENCOUNTER — Ambulatory Visit (INDEPENDENT_AMBULATORY_CARE_PROVIDER_SITE_OTHER): Payer: BC Managed Care – PPO | Admitting: Psychologist

## 2020-08-13 DIAGNOSIS — Z630 Problems in relationship with spouse or partner: Secondary | ICD-10-CM

## 2020-08-13 DIAGNOSIS — F411 Generalized anxiety disorder: Secondary | ICD-10-CM | POA: Diagnosis not present

## 2020-08-13 DIAGNOSIS — Z9119 Patient's noncompliance with other medical treatment and regimen: Secondary | ICD-10-CM | POA: Diagnosis not present

## 2020-08-22 ENCOUNTER — Other Ambulatory Visit (INDEPENDENT_AMBULATORY_CARE_PROVIDER_SITE_OTHER): Payer: Self-pay | Admitting: Family Medicine

## 2020-08-22 DIAGNOSIS — R7303 Prediabetes: Secondary | ICD-10-CM

## 2020-08-28 ENCOUNTER — Other Ambulatory Visit: Payer: Self-pay

## 2020-08-28 ENCOUNTER — Encounter (INDEPENDENT_AMBULATORY_CARE_PROVIDER_SITE_OTHER): Payer: Self-pay | Admitting: Family Medicine

## 2020-08-28 ENCOUNTER — Ambulatory Visit (INDEPENDENT_AMBULATORY_CARE_PROVIDER_SITE_OTHER): Payer: BC Managed Care – PPO | Admitting: Family Medicine

## 2020-08-28 VITALS — BP 136/79 | HR 77 | Temp 98.0°F | Ht 70.0 in | Wt 322.0 lb

## 2020-08-28 DIAGNOSIS — I10 Essential (primary) hypertension: Secondary | ICD-10-CM

## 2020-08-28 DIAGNOSIS — F3289 Other specified depressive episodes: Secondary | ICD-10-CM | POA: Diagnosis not present

## 2020-08-28 DIAGNOSIS — R7303 Prediabetes: Secondary | ICD-10-CM

## 2020-08-28 DIAGNOSIS — E559 Vitamin D deficiency, unspecified: Secondary | ICD-10-CM | POA: Diagnosis not present

## 2020-08-28 DIAGNOSIS — Z9189 Other specified personal risk factors, not elsewhere classified: Secondary | ICD-10-CM | POA: Diagnosis not present

## 2020-08-28 DIAGNOSIS — Z6841 Body Mass Index (BMI) 40.0 and over, adult: Secondary | ICD-10-CM

## 2020-08-28 DIAGNOSIS — E66813 Obesity, class 3: Secondary | ICD-10-CM

## 2020-08-28 MED ORDER — METFORMIN HCL 500 MG PO TABS: 500.0000 mg | ORAL_TABLET | Freq: Three times a day (TID) | ORAL | 0 refills | Status: DC

## 2020-08-28 MED ORDER — BUPROPION HCL ER (SR) 150 MG PO TB12: 150.0000 mg | ORAL_TABLET | Freq: Every morning | ORAL | 0 refills | Status: DC

## 2020-08-28 MED ORDER — VITAMIN D (ERGOCALCIFEROL) 1.25 MG (50000 UNIT) PO CAPS: 50000.0000 [IU] | ORAL_CAPSULE | ORAL | 0 refills | Status: DC

## 2020-08-28 MED ORDER — HYDROCHLOROTHIAZIDE 25 MG PO TABS: 25.0000 mg | ORAL_TABLET | Freq: Every day | ORAL | 1 refills | Status: DC

## 2020-08-29 NOTE — Progress Notes (Signed)
Chief Complaint:   OBESITY Bryan [name] is here to discuss his progress with his obesity treatment plan along with follow-up of his obesity related diagnoses. Bryan [name] is on the Category 3 Plan and states he is following his eating plan approximately 65% of the time. Bryan [name] states he is walking for 30 minutes 2 times per week, and playing pickle ball for 60 minutes 2 times per week.  Today's visit was #: 64 Starting weight: 326 lbs Starting date: 09/08/2017 Today's weight: 322 lbs Today's date: 08/28/2020 Total lbs lost to date: 4 Total lbs lost since last in-office visit: 2  Interim History: Bryan [name] continue to do well with weight loss on his Category 3 plan. His hunger is controlled and he is working on eating all of his protein.  Subjective:   1. Essential hypertension Bryan [name]'s blood pressure is well controlled. He denies chest pain or headache.  2. Pre-diabetes Bryan [name] is doing well on his diet, and he is tolerating metformin well.  3. Vitamin D deficiency Bryan [name] is stable on Vit D, but his level is not yet at goal.  4. Other depression with emotional eating Bryan [name] is stable on Wellbutrin, and he denies worsening in sleep.  5. At risk for diabetes mellitus Bryan [name] is at higher than average risk for developing diabetes due to his obesity.   Assessment/Plan:   1. Essential hypertension Bryan [name] will continue working on healthy weight loss, diet, and exercise to improve blood pressure control. We will watch for signs of hypotension as he continues his lifestyle modifications. We will refill hydrochlorothiazide for 90 days, with no refills.  - hydrochlorothiazide (HYDRODIURIL) 25 MG tablet; Take 1 tablet (25 mg total) by mouth daily.  Dispense: 90 tablet; Refill: 1  2. Pre-diabetes Bryan [name] will continue to work on weight loss, exercise, and decreasing simple carbohydrates to help decrease the risk of diabetes. We will refill metformin for 1 month.  - metFORMIN  (GLUCOPHAGE) 500 MG tablet; Take 1 tablet (500 mg total) by mouth 3 (three) times daily.  Dispense: 90 tablet; Refill: 0  3. Vitamin D deficiency Low Vitamin D level contributes to fatigue and are associated with obesity, breast, and colon cancer. We will refill prescription Vitamin D for 1 month. Bryan [name] will follow-up for routine testing of Vitamin D, at least 2-3 times per year to avoid over-replacement.  - Vitamin D, Ergocalciferol, (DRISDOL) 1.25 MG (50000 UNIT) CAPS capsule; Take 1 capsule (50,000 Units total) by mouth every 7 (seven) days.  Dispense: 4 capsule; Refill: 0  4. Other depression with emotional eating Behavior modification techniques were discussed today to help Bryan [name] deal with his emotional/non-hunger eating behaviors. We will refill Wellbutrin SR for 1 month. Orders and follow up as documented in patient record.   - buPROPion (WELLBUTRIN SR) 150 MG 12 hr tablet; Take 1 tablet (150 mg total) by mouth in the morning.  Dispense: 30 tablet; Refill: 0  5. At risk for diabetes mellitus Bryan [name] was given approximately 15 minutes of diabetes education and counseling today. We discussed intensive lifestyle modifications today with an emphasis on weight loss as well as increasing exercise and decreasing simple carbohydrates in his diet. We also reviewed medication options with an emphasis on risk versus benefit of those discussed.   Repetitive spaced learning was employed today to elicit superior memory formation and behavioral change.  6. Class 3 severe obesity with serious comorbidity and body mass index (BMI) of 45.0 to 49.9 in adult, unspecified obesity type (HCC) Bryan [name] is currently in  the action stage of change. As such, his goal is to continue with weight loss efforts. He has agreed to the Category 3 Plan with shredded chicken soups.   Exercise goals: As is.  Behavioral modification strategies: increasing lean protein intake.  Bryan [name] has agreed to follow-up with our  clinic in 3 weeks. He was informed of the importance of frequent follow-up visits to maximize his success with intensive lifestyle modifications for his multiple health conditions.   Objective:   Blood pressure 136/79, pulse 77, temperature 98 F (36.7 C), height 5\' 10"  (1.778 m), weight (!) 322 lb (146.1 kg), SpO2 97 %. Body mass index is 46.2 kg/m.  General: Cooperative, alert, well developed, in no acute distress. HEENT: Conjunctivae and lids unremarkable. Cardiovascular: Regular rhythm.  Lungs: Normal work of breathing. Neurologic: No focal deficits.   Lab Results  Component Value Date   CREATININE 1.01 05/29/2020   BUN 17 05/29/2020   NA 140 05/29/2020   K 4.3 05/29/2020   CL 100 05/29/2020   CO2 23 05/29/2020   Lab Results  Component Value Date   ALT 31 05/29/2020   AST 19 05/29/2020   ALKPHOS 54 05/29/2020   BILITOT 0.4 05/29/2020   Lab Results  Component Value Date   HGBA1C 6.3 (H) 05/29/2020   HGBA1C 6.4 (H) 01/10/2020   HGBA1C 6.2 07/07/2019   HGBA1C 5.9 (H) 11/17/2018   HGBA1C 5.9 (H) 08/18/2018   Lab Results  Component Value Date   INSULIN 20.9 05/29/2020   INSULIN 24.0 01/10/2020   INSULIN 21.9 11/17/2018   INSULIN 24.0 08/18/2018   INSULIN 33.0 (H) 02/17/2018   Lab Results  Component Value Date   TSH 1.560 01/10/2020   Lab Results  Component Value Date   CHOL 179 05/29/2020   HDL 43 05/29/2020   LDLCALC 97 05/29/2020   LDLDIRECT 136.0 11/16/2016   TRIG 232 (H) 05/29/2020   CHOLHDL 4 07/07/2019   Lab Results  Component Value Date   WBC 5.5 07/07/2019   HGB 15.3 07/07/2019   HCT 46.1 07/07/2019   MCV 86.1 07/07/2019   PLT 252.0 07/07/2019   No results found for: IRON, TIBC, FERRITIN  Attestation Statements:   Reviewed by clinician on day of visit: allergies, medications, problem list, medical history, surgical history, family history, social history, and previous encounter notes.   I, Trixie Dredge, am acting as transcriptionist  for Dennard Nip, MD.  I have reviewed the above documentation for accuracy and completeness, and I agree with the above. -  Dennard Nip, MD

## 2020-08-30 ENCOUNTER — Ambulatory Visit (INDEPENDENT_AMBULATORY_CARE_PROVIDER_SITE_OTHER): Payer: BC Managed Care – PPO | Admitting: Psychologist

## 2020-08-30 DIAGNOSIS — F411 Generalized anxiety disorder: Secondary | ICD-10-CM

## 2020-08-30 DIAGNOSIS — Z9119 Patient's noncompliance with other medical treatment and regimen: Secondary | ICD-10-CM | POA: Diagnosis not present

## 2020-08-30 DIAGNOSIS — Z630 Problems in relationship with spouse or partner: Secondary | ICD-10-CM | POA: Diagnosis not present

## 2020-09-11 ENCOUNTER — Encounter: Payer: Self-pay | Admitting: Internal Medicine

## 2020-09-11 ENCOUNTER — Other Ambulatory Visit: Payer: Self-pay

## 2020-09-11 ENCOUNTER — Ambulatory Visit: Payer: BC Managed Care – PPO | Admitting: Internal Medicine

## 2020-09-11 VITALS — BP 132/85 | HR 69 | Temp 98.0°F | Resp 18 | Ht 70.0 in | Wt 329.4 lb

## 2020-09-11 DIAGNOSIS — I10 Essential (primary) hypertension: Secondary | ICD-10-CM

## 2020-09-11 DIAGNOSIS — Z23 Encounter for immunization: Secondary | ICD-10-CM | POA: Diagnosis not present

## 2020-09-11 DIAGNOSIS — Z0000 Encounter for general adult medical examination without abnormal findings: Secondary | ICD-10-CM | POA: Diagnosis not present

## 2020-09-11 DIAGNOSIS — R739 Hyperglycemia, unspecified: Secondary | ICD-10-CM | POA: Diagnosis not present

## 2020-09-11 NOTE — Assessment & Plan Note (Signed)
Here for CPX Diabetes: On Metformin, check A1c. Hyperlipidemia: Last LDL below 100, continue atorvastatin Morbid obesity: Working with a wellness clinic OSA: Reports good compliance with CPAP Seasonal allergies: Complain of cough without red flag symptoms, recommend consistent use of Flonase and OTC antihistaminics. Vitamin D deficiency: Last vitamin D level normal, on supplements. RTC 1 year

## 2020-09-11 NOTE — Patient Instructions (Addendum)
Check the  blood pressure regularly  BP GOAL is between 110/65 and  135/85. If it is consistently higher or lower, let me know  Stay tuned with the news about booster for COVID-19.   GO TO THE LAB : Get the blood work     [place], [place] back for your Shingrix booster in 2 months     Come back for a physical exam in 1 year

## 2020-09-11 NOTE — Assessment & Plan Note (Signed)
-  Td 2011 will hold off for now.  Getting 3 shots today - PNM 23 today (h/o OSA, obesity) - JJ covid shot 02/17/2020.  Recommend booster when approved by the CDC - shingrex discussed before, does like to proceed today, #1 provided -Flu shot today -CCS : +FH Mat. Aunt had colon Ca Cscope  aprox 12/07/2002, normal . cscope 2015- polyps, due for cscope, plans to contact GI -Prostate ca screening : last  PSA wnl -diet, exercise: sees the wellness clinic -Labs: CBC, A1c

## 2020-09-11 NOTE — Progress Notes (Signed)
Subjective:    Patient ID: Bryan [name], male    DOB: February 14, 1961, 59 y.o.   MRN: [id number]  DOS:  09/11/2020 Type of visit - description: CPX In general feeling well.  Wt Readings from Last 3 Encounters:  09/11/20 (!) 329 lb 6 oz (149.4 kg)  08/28/20 (!) 322 lb (146.1 kg)  08/05/20 (!) 324 lb (147 kg)     Review of Systems Reports occasional cough, he thinks related to allergies, postnasal dripping.  Denies fever chills.  No unintentional weight loss.  No chest pain no difficulty breathing.  No GERD symptoms   Other than above, a 14 point review of systems is negative    Past Medical History:  Diagnosis Date  . Dental crowns present   . Hyperlipidemia   . Lower back pain   . Nephrolithiasis 2017  . Prediabetes 09/04/2014  . Seasonal allergies   . Sebaceous cyst 04/2013   upper back  . Sleep apnea    uses CPAP nightly  . Urolithiasis 10/2016    Past Surgical History:  Procedure Laterality Date  . ADENOIDECTOMY     as a child  . CHOLECYSTECTOMY  01/12/2012   Procedure: LAPAROSCOPIC CHOLECYSTECTOMY WITH INTRAOPERATIVE CHOLANGIOGRAM;  Surgeon: [name], MD;  Location: [facility];  Service: General;  Laterality: N/A;  . CYST EXCISION     cyst removal from back  . EAR CYST EXCISION N/A 05/04/2013   Procedure: Excision subcutaneous mass posterior neck;  Surgeon: Imogene Burn. [name], MD;  Location: [facility];  Service: General;  Laterality: N/A;    Allergies as of 09/11/2020      Reactions   Lisinopril Cough      Medication List       Accurate as of September 11, 2020  9:07 PM. If you have any questions, ask your nurse or doctor.        aspirin 81 MG tablet Take 81 mg by mouth daily.   atorvastatin 20 MG tablet Commonly known as: LIPITOR Take 1 tablet (20 mg total) by mouth at bedtime.   azelastine 0.1 % nasal spray Commonly known as: ASTELIN Place 2 sprays into both nostrils at bedtime as needed for rhinitis. Use in each nostril as directed     buPROPion 150 MG 12 hr tablet Commonly known as: Wellbutrin SR Take 1 tablet (150 mg total) by mouth in the morning.   CENTRUM PO Take 1 tablet by mouth daily.   CO ENZYME Q-10 PO Take by mouth daily.   fish oil-omega-3 fatty acids 1000 MG capsule Take 1 g by mouth daily.   hydrochlorothiazide 25 MG tablet Commonly known as: HYDRODIURIL Take 1 tablet (25 mg total) by mouth daily.   Melatonin 1 MG Caps Take 1 mg by mouth at bedtime as needed.   metFORMIN 500 MG tablet Commonly known as: GLUCOPHAGE Take 1 tablet (500 mg total) by mouth 3 (three) times daily.   Vitamin D (Ergocalciferol) 1.25 MG (50000 UNIT) Caps capsule Commonly known as: DRISDOL Take 1 capsule (50,000 Units total) by mouth every 7 (seven) days.          Objective:   Physical Exam BP 132/85 (BP Location: Left Arm, Patient Position: Sitting, Cuff Size: Normal)   Pulse 69   Temp 98 F (36.7 C) (Oral)   Resp 18   Ht 5\' 10"  (1.778 m)   Wt (!) 329 lb 6 oz (149.4 kg)   SpO2 97%   BMI 47.26 kg/m  General:  Well developed, NAD, BMI noted Neck: No  thyromegaly  HEENT:  Normocephalic . Face symmetric, atraumatic Lungs:  CTA B Normal respiratory effort, no intercostal retractions, no accessory muscle use. Heart: RRR,  no murmur.  Abdomen:  Not distended, soft, non-tender. No rebound or rigidity.   Lower extremities: no pretibial edema bilaterally  Skin: Exposed areas without rash. Not pale. Not jaundice Neurologic:  alert & oriented X3.  Speech normal, gait appropriate for age and unassisted Strength symmetric and appropriate for age.  Psych: Cognition and judgment appear intact.  Cooperative with normal attention span and concentration.  Behavior appropriate. No anxious or depressed appearing.     Assessment    Assessment Prediabetes HTN Hyperlipidemia Insomnia: on OTCs Morbid obesity, BMI 46 OSA, CPAP Seasonal allergies Kidney stone (first) 10-2016 Vit D def dx 09/2017 Retina  detachment DX 06-2019  PLAN: Here for CPX Diabetes: On Metformin, check A1c. Hyperlipidemia: Last LDL below 100, continue atorvastatin Morbid obesity: Working with a wellness clinic OSA: Reports good compliance with CPAP Seasonal allergies: Complain of cough without red flag symptoms, recommend consistent use of Flonase and OTC antihistaminics. Vitamin D deficiency: Last vitamin D level normal, on supplements. RTC 1 year  This visit occurred during the SARS-CoV-2 public health emergency.  Safety protocols were in place, including screening questions prior to the visit, additional usage of staff PPE, and extensive cleaning of exam room while observing appropriate contact time as indicated for disinfecting solutions.

## 2020-09-11 NOTE — Progress Notes (Signed)
Pre visit review using our clinic review tool, if applicable. No additional management support is needed unless otherwise documented below in the visit note. 

## 2020-09-12 LAB — CBC WITH DIFFERENTIAL/PLATELET
Absolute Monocytes: 419 cells/uL (ref 200–950)
Basophils Absolute: 71 cells/uL (ref 0–200)
Basophils Relative: 1.2 %
Eosinophils Absolute: 201 cells/uL (ref 15–500)
Eosinophils Relative: 3.4 %
HCT: 45 % (ref 38.5–50.0)
Hemoglobin: 15.2 g/dL (ref 13.2–17.1)
Lymphs Abs: 1741 cells/uL (ref 850–3900)
MCH: 29 pg (ref 27.0–33.0)
MCHC: 33.8 g/dL (ref 32.0–36.0)
MCV: 85.9 fL (ref 80.0–100.0)
MPV: 9.2 fL (ref 7.5–12.5)
Monocytes Relative: 7.1 %
Neutro Abs: 3469 cells/uL (ref 1500–7800)
Neutrophils Relative %: 58.8 %
Platelets: 293 10*3/uL (ref 140–400)
RBC: 5.24 10*6/uL (ref 4.20–5.80)
RDW: 12.9 % (ref 11.0–15.0)
Total Lymphocyte: 29.5 %
WBC: 5.9 10*3/uL (ref 3.8–10.8)

## 2020-09-12 LAB — HEMOGLOBIN A1C
Hgb A1c MFr Bld: 6.3 % of total Hgb — ABNORMAL HIGH (ref <5.7)
Mean Plasma Glucose: 134 (calc)
eAG (mmol/L): 7.4 (calc)

## 2020-09-18 ENCOUNTER — Encounter (INDEPENDENT_AMBULATORY_CARE_PROVIDER_SITE_OTHER): Payer: Self-pay | Admitting: Family Medicine

## 2020-09-18 ENCOUNTER — Other Ambulatory Visit: Payer: Self-pay

## 2020-09-18 ENCOUNTER — Ambulatory Visit (INDEPENDENT_AMBULATORY_CARE_PROVIDER_SITE_OTHER): Payer: BC Managed Care – PPO | Admitting: Family Medicine

## 2020-09-18 VITALS — BP 123/78 | HR 73 | Temp 98.2°F | Ht 70.0 in | Wt 320.0 lb

## 2020-09-18 DIAGNOSIS — Z6841 Body Mass Index (BMI) 40.0 and over, adult: Secondary | ICD-10-CM | POA: Diagnosis not present

## 2020-09-18 DIAGNOSIS — R7303 Prediabetes: Secondary | ICD-10-CM

## 2020-09-18 DIAGNOSIS — Z9189 Other specified personal risk factors, not elsewhere classified: Secondary | ICD-10-CM | POA: Diagnosis not present

## 2020-09-18 DIAGNOSIS — E559 Vitamin D deficiency, unspecified: Secondary | ICD-10-CM

## 2020-09-18 MED ORDER — METFORMIN HCL 500 MG PO TABS: 500.0000 mg | ORAL_TABLET | Freq: Three times a day (TID) | ORAL | 0 refills | Status: DC

## 2020-09-18 MED ORDER — WEGOVY 0.25 MG/0.5ML ~~LOC~~ SOAJ: 0.2500 mg | SUBCUTANEOUS | 0 refills | Status: DC

## 2020-09-19 LAB — VITAMIN D 25 HYDROXY (VIT D DEFICIENCY, FRACTURES): Vit D, 25-Hydroxy: 57.5 ng/mL (ref 30.0–100.0)

## 2020-09-23 ENCOUNTER — Telehealth (INDEPENDENT_AMBULATORY_CARE_PROVIDER_SITE_OTHER): Payer: Self-pay

## 2020-09-23 NOTE — Telephone Encounter (Signed)
Prior Authorization has been initiated via CoverMyMeds.com for Wegovy 0.25mg /0.44mL injeciton. Currently waiting on response from insurance for approval or denial.   CoverMyMeds has established a business relationship with the pharmaceutical manufacturer that results in a differentiated user experience on CoverMyMeds for the PA process, and may include patient support services. There may be lower cost medications available or preferred on your patients plan. Bryan [name] (Key: [id number]) Wegovy 0.25MG /0.5ML auto-injectors   Form [employer] Electronic PA Form (2017 NCPDP)    Submit Clinical Questions  Determination: Wait for Determination Please wait for [employer] NCPDP 2017 to return a determination.   This is an Medical laboratory scientific officer form ([place]). Complete the fields below, then click the Send to Plan button to submit.  Name: Bryan [name] Date of Birth: 02/12/1961 Gender: Male  Male Member ID: .......[id number] Address [address]: [name]: [name] Zip: [name]    IMPORTANT! The medication selected determines the Criteria returned. Please make sure you are submitting the correct Brand or Generic medication, to enhance your ePA experience. HFWYOV 0.25MG /0.5ML auto-injectors Quantity Requested quantity should be entered based off a 30-day supply. For example, 90 tablets/90 days should be entered as quantity of 30 tablets. Enter numeric value with up to 2 decimal places and a preceding zero before the decimal when applicable. For example, .5 must be entered as 0.5. Please do not include any white spaces. 2 Confirm dosage form  Milliliter Days Supply Not to exceed a 30-day supply 30 Primary Diagnosis ICD-10 R73.03 - Prediabetes Commonly used ICD-10 diagnosis code(s):  E66* Overweight and obesity Z68* Body mass index [BMI]  These codes are presented for information purposes only. It represents no statement, promise,  or guarantee concerning coverage and/or levels of reimbursement, payment, or charge, and is not intended to increase or maximize reimbursement by any payer. It is the responsibility of the healthcare provider to determine appropriate code(s) for services provided to his or her patient.  Secondary Diagnosis ICD-10 Z68.42 - Body mass index [BMI] 45.0-49.9, adult  Provider NPI [id number] Name [place] Address [address] [place] [address] (optional) [place] Phone [phone] Fax [phone]  Type of Review Are you requesting an URGENT review? Definition of URGENT: When the physician believes that waiting for a decision under the standard time frame could place the enrollee's life, health, or ability to regain maximum function in serious jeopardy.  Yes  No   Prescriber Next Steps Click the "Send to Plan" button to submit this information to [place]. (If it is disabled, be sure all required fields have been completed.) This electronic submission does not require a signature. [employer] will respond automatically with your next steps.   [facility] [place] STD 05-2020 CAS WEB ePA Review [facility]); QSet has den ... Wegovy [place] STD 05-2020 CAS WEB ePA Review [facility]); QSet has denial reasons loaded alw [id number]  Has the patient received at least 3 months of therapy with the requested drug at a stable maintenance dose?  YES  NO Does the patient have a body mass index (BMI) greater than or equal to 30 kilogram per square meter?  YES  NO Has the patient participated in a comprehensive weight management program that encourages behavioral modification, reduced calorie diet and increased physical activity with continuing follow-up for at least 6 months prior to using drug therapy?  YES  NO Will the requested medication be used with a reduced calorie diet and increased physical  activity for chronic weight management in an  adult?  YES  NO

## 2020-09-24 ENCOUNTER — Encounter (INDEPENDENT_AMBULATORY_CARE_PROVIDER_SITE_OTHER): Payer: Self-pay

## 2020-09-24 NOTE — Telephone Encounter (Signed)
Elijan Start II (Key: [id number]) UGAYGE 0.25MG /0.5ML auto-injectors   Form: Charity fundraiser PA Form (2017 NCPDP) Created: 6 days ago Sent to Plan: 15 hours ago Plan Response: 15 hours ago Submit Clinical Questions: 15 hours ago Determination: Favorable When: 7 minutes ago Message from Plan: Your PA request has been approved. Additional information will be provided in the approval communication. (Message 1145)  [facility]  received a request from your provider for coverage of Wegovy 0.25MG /0.5ML [place] SOAJ. As long as you remain covered by the [facility] and there are no changes to your plan benefits, this request is approved for the following time period: 09/23/2020 - 04/23/2021 Approvals may be subject to dosing limits in accordance with FDA approved labeling, evidence-based practice guidelines or your prescription drug plan benefits. If you have not already done so, you may ask your pharmacist to fill the prescription. If you have questions, please call Customer Care toll-free at the number on your [place] ID card toll-free at [phone]. Sincerely, [facility]

## 2020-09-27 ENCOUNTER — Ambulatory Visit (INDEPENDENT_AMBULATORY_CARE_PROVIDER_SITE_OTHER): Payer: BC Managed Care – PPO | Admitting: Psychologist

## 2020-09-27 DIAGNOSIS — Z9119 Patient's noncompliance with other medical treatment and regimen: Secondary | ICD-10-CM

## 2020-09-27 DIAGNOSIS — Z630 Problems in relationship with spouse or partner: Secondary | ICD-10-CM

## 2020-09-27 DIAGNOSIS — F411 Generalized anxiety disorder: Secondary | ICD-10-CM | POA: Diagnosis not present

## 2020-09-30 NOTE — Progress Notes (Signed)
Chief Complaint:   OBESITY Bryan [name] is here to discuss his progress with his obesity treatment plan along with follow-up of his obesity related diagnoses. Bryan [name] is on the Category 3 Plan and states he is following his eating plan approximately 60% of the time. Bryan [name] states he is walking and playing pickle ball for 30-45 minutes 3 times per week.  Today's visit was #: 74 Starting weight: 326 lbs Starting date: 09/07/2017 Today's weight: 320 lbs Today's date: 09/18/2020 Total lbs lost to date: 6 Total lbs lost since last in-office visit: 2  Interim History: Bryan [name] continues to do well with weight loss. He is working on some meal planning but has extra challenges with his wife's health, but things seems to be settling down now.  Subjective:   1. Pre-diabetes Bryan [name]'s last A1c was 6.3, but he is still at high risk of diabetes mellitus.  2. Vitamin D deficiency Bryan [name] is on Vit D, and he is due for labs. He notes fatigue.  3. At risk for nausea Bryan [name] is at [facility] for nausea due to starting [facility].  Assessment/Plan:   1. Pre-diabetes Bryan [name] will continue to work on weight loss, exercise, and decreasing simple carbohydrates to help decrease the risk of diabetes. We will refill metformin for 1 month, and he will start GLP-1 and will continue to monitor closely.  - metFORMIN (GLUCOPHAGE) 500 MG tablet; Take 1 tablet (500 mg total) by mouth 3 (three) times daily.  Dispense: 90 tablet; Refill: 0  2. Vitamin D deficiency Low Vitamin D level contributes to fatigue and are associated with obesity, breast, and colon cancer. We will check labs today. Bryan [name] agreed to continue taking prescription Vitamin D as is. He will follow-up for routine testing of Vitamin D, at least 2-3 times per year to avoid over-replacement.  - VITAMIN D 25 Hydroxy (Vit-D Deficiency, Fractures)  3. At risk for nausea Bryan [name] was given approximately 15 minutes of nausea prevention counseling today.  Bryan [name] is at risk for nausea due to his new or current medication. He was encouraged to titrate his medication slowly, make sure to stay hydrated, eat smaller portions throughout the day, and avoid high fat meals.   4. Class 3 severe obesity with serious comorbidity and body mass index (BMI) of 45.0 to 49.9 in adult, unspecified obesity type (HCC) Bryan [name] is currently in the action stage of change. As such, his goal is to continue with weight loss efforts. He has agreed to the Category 3 Plan.   We discussed various medication options to help Bryan [name] with his weight loss efforts and we both agreed to start Wegovy at 0.25 mg with no refill.  - Semaglutide-Weight Management (WEGOVY) 0.25 MG/0.5ML SOAJ; Inject 0.25 mg into the skin once a week.  Dispense: 2 mL; Refill: 0  Exercise goals: As is.  Behavioral modification strategies: meal planning and cooking strategies.  Bryan [name] has agreed to follow-up with our clinic in 3 weeks. He was informed of the importance of frequent follow-up visits to maximize his success with intensive lifestyle modifications for his multiple health conditions.   Bryan [name] was informed we would discuss his lab results at his next visit unless there is a critical issue that needs to be addressed sooner. Bryan [name] agreed to keep his next visit at the agreed upon time to discuss these results.  Objective:   Blood pressure 123/78, pulse 73, temperature 98.2 F (36.8 C), height 5\' 10"  (1.778 m), weight (!) 320 lb (145.2 kg), SpO2 98 %.  Body mass index is 45.92 kg/m.  General: Cooperative, alert, well developed, in no acute distress. HEENT: Conjunctivae and lids unremarkable. Cardiovascular: Regular rhythm.  Lungs: Normal work of breathing. Neurologic: No focal deficits.   Lab Results  Component Value Date   CREATININE 1.01 05/29/2020   BUN 17 05/29/2020   NA 140 05/29/2020   K 4.3 05/29/2020   CL 100 05/29/2020   CO2 23 05/29/2020   Lab Results  Component Value  Date   ALT 31 05/29/2020   AST 19 05/29/2020   ALKPHOS 54 05/29/2020   BILITOT 0.4 05/29/2020   Lab Results  Component Value Date   HGBA1C 6.3 (H) 09/11/2020   HGBA1C 6.3 (H) 05/29/2020   HGBA1C 6.4 (H) 01/10/2020   HGBA1C 6.2 07/07/2019   HGBA1C 5.9 (H) 11/17/2018   Lab Results  Component Value Date   INSULIN 20.9 05/29/2020   INSULIN 24.0 01/10/2020   INSULIN 21.9 11/17/2018   INSULIN 24.0 08/18/2018   INSULIN 33.0 (H) 02/17/2018   Lab Results  Component Value Date   TSH 1.560 01/10/2020   Lab Results  Component Value Date   CHOL 179 05/29/2020   HDL 43 05/29/2020   LDLCALC 97 05/29/2020   LDLDIRECT 136.0 11/16/2016   TRIG 232 (H) 05/29/2020   CHOLHDL 4 07/07/2019   Lab Results  Component Value Date   WBC 5.9 09/11/2020   HGB 15.2 09/11/2020   HCT 45.0 09/11/2020   MCV 85.9 09/11/2020   PLT 293 09/11/2020   No results found for: IRON, TIBC, FERRITIN  Attestation Statements:   Reviewed by clinician on day of visit: allergies, medications, problem list, medical history, surgical history, family history, social history, and previous encounter notes.   I, Bryan [name], am acting as transcriptionist for Dennard Nip, MD.  I have reviewed the above documentation for accuracy and completeness, and I agree with the above. -  Dennard Nip, MD

## 2020-10-09 ENCOUNTER — Other Ambulatory Visit: Payer: Self-pay

## 2020-10-09 ENCOUNTER — Ambulatory Visit (INDEPENDENT_AMBULATORY_CARE_PROVIDER_SITE_OTHER): Payer: BC Managed Care – PPO | Admitting: Family Medicine

## 2020-10-09 VITALS — BP 143/82 | HR 79 | Temp 98.2°F | Ht 70.0 in | Wt 323.0 lb

## 2020-10-09 DIAGNOSIS — F3289 Other specified depressive episodes: Secondary | ICD-10-CM

## 2020-10-09 DIAGNOSIS — E559 Vitamin D deficiency, unspecified: Secondary | ICD-10-CM | POA: Diagnosis not present

## 2020-10-09 DIAGNOSIS — Z9189 Other specified personal risk factors, not elsewhere classified: Secondary | ICD-10-CM | POA: Diagnosis not present

## 2020-10-09 DIAGNOSIS — Z6841 Body Mass Index (BMI) 40.0 and over, adult: Secondary | ICD-10-CM

## 2020-10-09 DIAGNOSIS — R7303 Prediabetes: Secondary | ICD-10-CM | POA: Diagnosis not present

## 2020-10-09 MED ORDER — SAXENDA 18 MG/3ML ~~LOC~~ SOPN: 3.0000 mg | PEN_INJECTOR | Freq: Every day | SUBCUTANEOUS | 0 refills | Status: DC

## 2020-10-09 MED ORDER — VITAMIN D (ERGOCALCIFEROL) 1.25 MG (50000 UNIT) PO CAPS: 50000.0000 [IU] | ORAL_CAPSULE | ORAL | 0 refills | Status: DC

## 2020-10-09 MED ORDER — METFORMIN HCL 500 MG PO TABS: 500.0000 mg | ORAL_TABLET | Freq: Three times a day (TID) | ORAL | 0 refills | Status: DC

## 2020-10-09 MED ORDER — BUPROPION HCL ER (SR) 150 MG PO TB12: 150.0000 mg | ORAL_TABLET | Freq: Every morning | ORAL | 0 refills | Status: DC

## 2020-10-09 NOTE — Progress Notes (Signed)
Chief Complaint:   OBESITY Bryan [name] is here to discuss his progress with his obesity treatment plan along with follow-up of his obesity related diagnoses. Bryan [name] is on the Category 3 Plan and states he is following his eating plan approximately 60% of the time. Bryan [name] states he is walking and doing yard work for 45 minutes 2-3 times per week.  Today's visit was #: 20 Starting weight: 326 lbs Starting date: 09/07/2017 Today's weight: 323 lbs Today's date: 10/09/2020 Total lbs lost to date: 3 Total lbs lost since last in-office visit: 0  Interim History: Bryan [name] wasn't able to get WYOVZC due to insurance. He is open to looking at other medication options. He has tolerated Victoza in the past.  Subjective:   1. Pre-diabetes Bryan [name] is stable on metformin, and he is working on diet and has increased exercise. He denies nausea or vomiting.  2. Vitamin D deficiency Bryan [name] is doing well on Vit D, and he requests a refill today.  3. Other depression with emotional eating Bryan [name] is stable on Wellbutrin, and his blood pressure is elevated today. It is normally within normal limits. He denies chest pain.  4. At risk for nausea Bryan [name] is at risk of nausea due to starting GLP-1.  Assessment/Plan:   1. Pre-diabetes Domonik will continue to work on weight loss, exercise, and decreasing simple carbohydrates to help decrease the risk of diabetes. We will refill metformin for 1 month.  - metFORMIN (GLUCOPHAGE) 500 MG tablet; Take 1 tablet (500 mg total) by mouth 3 (three) times daily.  Dispense: 90 tablet; Refill: 0  2. Vitamin D deficiency Low Vitamin D level contributes to fatigue and are associated with obesity, breast, and colon cancer. We will refill prescription Vitamin D for 1 month. Jidenna will follow-up for routine testing of Vitamin D, at least 2-3 times per year to avoid over-replacement.  - Vitamin D, Ergocalciferol, (DRISDOL) 1.25 MG (50000 UNIT) CAPS capsule; Take 1  capsule (50,000 Units total) by mouth every 7 (seven) days.  Dispense: 4 capsule; Refill: 0  3. Other depression with emotional eating Behavior modification techniques were discussed today to help Bryan [name] deal with his emotional/non-hunger eating behaviors. We will refill Wellbutrin SR for 1 month, and will recheck his blood pressure in 2-3 weeks. Orders and follow up as documented in patient record.   - buPROPion (WELLBUTRIN SR) 150 MG 12 hr tablet; Take 1 tablet (150 mg total) by mouth in the morning.  Dispense: 30 tablet; Refill: 0  4. At risk for nausea Bryan [name] was given approximately 15 minutes of nausea prevention counseling today. Bryan [name] is at risk for nausea due to his new or current medication. He was encouraged to titrate his medication slowly, make sure to stay hydrated, eat smaller portions throughout the day, and avoid high fat meals.   5. Class 3 severe obesity with serious comorbidity and body mass index (BMI) of 45.0 to 49.9 in adult, unspecified obesity type (HCC) Clinten is currently in the action stage of change. As such, his goal is to continue with weight loss efforts. He has agreed to the Category 3 Plan.   We discussed various medication options to help Bryan [name] with his weight loss efforts and we both agreed to discontinue Wegovy, and start Saxenda 3.0 mg SubQ daily with no refills (he is to increase to 1.8 mg by next visit); and nano needles #100 with no refills.  - Liraglutide -Weight Management (SAXENDA) 18 MG/3ML SOPN; Inject 3 mg into the  skin daily.  Dispense: 15 mL; Refill: 0  Exercise goals: As is.  Behavioral modification strategies: increasing lean protein intake, meal planning and cooking strategies and emotional eating strategies.  Bryan [name] has agreed to follow-up with our clinic in 2 to 3 weeks. He was informed of the importance of frequent follow-up visits to maximize his success with intensive lifestyle modifications for his multiple health conditions.     Objective:   Blood pressure (!) 143/82, pulse 79, temperature 98.2 F (36.8 C), height 5\' 10"  (1.778 m), weight (!) 323 lb (146.5 kg), SpO2 97 %. Body mass index is 46.35 kg/m.  General: Cooperative, alert, well developed, in no acute distress. HEENT: Conjunctivae and lids unremarkable. Cardiovascular: Regular rhythm.  Lungs: Normal work of breathing. Neurologic: No focal deficits.   Lab Results  Component Value Date   CREATININE 1.01 05/29/2020   BUN 17 05/29/2020   NA 140 05/29/2020   K 4.3 05/29/2020   CL 100 05/29/2020   CO2 23 05/29/2020   Lab Results  Component Value Date   ALT 31 05/29/2020   AST 19 05/29/2020   ALKPHOS 54 05/29/2020   BILITOT 0.4 05/29/2020   Lab Results  Component Value Date   HGBA1C 6.3 (H) 09/11/2020   HGBA1C 6.3 (H) 05/29/2020   HGBA1C 6.4 (H) 01/10/2020   HGBA1C 6.2 07/07/2019   HGBA1C 5.9 (H) 11/17/2018   Lab Results  Component Value Date   INSULIN 20.9 05/29/2020   INSULIN 24.0 01/10/2020   INSULIN 21.9 11/17/2018   INSULIN 24.0 08/18/2018   INSULIN 33.0 (H) 02/17/2018   Lab Results  Component Value Date   TSH 1.560 01/10/2020   Lab Results  Component Value Date   CHOL 179 05/29/2020   HDL 43 05/29/2020   LDLCALC 97 05/29/2020   LDLDIRECT 136.0 11/16/2016   TRIG 232 (H) 05/29/2020   CHOLHDL 4 07/07/2019   Lab Results  Component Value Date   WBC 5.9 09/11/2020   HGB 15.2 09/11/2020   HCT 45.0 09/11/2020   MCV 85.9 09/11/2020   PLT 293 09/11/2020   No results found for: IRON, TIBC, FERRITIN  Attestation Statements:   Reviewed by clinician on day of visit: allergies, medications, problem list, medical history, surgical history, family history, social history, and previous encounter notes.   I, Trixie Dredge, am acting as transcriptionist for Dennard Nip, MD.  I have reviewed the above documentation for accuracy and completeness, and I agree with the above. -  Dennard Nip, MD

## 2020-10-10 ENCOUNTER — Telehealth (INDEPENDENT_AMBULATORY_CARE_PROVIDER_SITE_OTHER): Payer: Self-pay

## 2020-10-10 NOTE — Telephone Encounter (Signed)
PA has been initiated via [email or web address] for Saxenda 18mg /79mL injection.   Buckley Wisher II (Key: [id number]) Rx #: [id number] Saxenda 18MG Fayne Mediate pen-injectors   Form Charity fundraiser PA Form (2017 NCPDP) Created 1 day ago Sent to Plan 1 minute ago Plan Response 1 minute ago Submit Clinical Questions less than a minute ago Determination Wait for Determination Please wait for [employer] NCPDP 2017 to return a determination.   Kellyn Vittorio II (Key: [id number])  Your information has been submitted to [facility]. To check for an updated outcome later, reopen this PA request from your dashboard.  If [employer] has not responded to your request within 24 hours, contact [facility] at [phone]. If you think there may be a problem with your PA request, use our live chat feature at the bottom right.

## 2020-10-15 ENCOUNTER — Encounter (INDEPENDENT_AMBULATORY_CARE_PROVIDER_SITE_OTHER): Payer: Self-pay

## 2020-10-15 NOTE — Telephone Encounter (Signed)
PA has been approved.  [facility] [employer]  received a request from your provider for coverage of Saxenda (liraglutide). As long as you remain covered by the [facility] and there are no changes to your plan benefits, this request is approved for the following time period: 10/10/2020 - 10/10/2021

## 2020-10-25 ENCOUNTER — Ambulatory Visit: Payer: BC Managed Care – PPO | Admitting: Psychologist

## 2020-10-28 ENCOUNTER — Other Ambulatory Visit: Payer: Self-pay

## 2020-10-28 ENCOUNTER — Ambulatory Visit (INDEPENDENT_AMBULATORY_CARE_PROVIDER_SITE_OTHER): Payer: BC Managed Care – PPO | Admitting: Family Medicine

## 2020-10-28 ENCOUNTER — Encounter (INDEPENDENT_AMBULATORY_CARE_PROVIDER_SITE_OTHER): Payer: Self-pay | Admitting: Family Medicine

## 2020-10-28 VITALS — BP 126/70 | HR 76 | Temp 97.4°F | Ht 70.0 in | Wt 326.0 lb

## 2020-10-28 DIAGNOSIS — Z9189 Other specified personal risk factors, not elsewhere classified: Secondary | ICD-10-CM | POA: Diagnosis not present

## 2020-10-28 DIAGNOSIS — F3289 Other specified depressive episodes: Secondary | ICD-10-CM

## 2020-10-28 DIAGNOSIS — Z6841 Body Mass Index (BMI) 40.0 and over, adult: Secondary | ICD-10-CM | POA: Diagnosis not present

## 2020-10-28 DIAGNOSIS — E559 Vitamin D deficiency, unspecified: Secondary | ICD-10-CM

## 2020-10-28 MED ORDER — VITAMIN D (ERGOCALCIFEROL) 1.25 MG (50000 UNIT) PO CAPS: 50000.0000 [IU] | ORAL_CAPSULE | ORAL | 0 refills | Status: DC

## 2020-10-28 MED ORDER — BUPROPION HCL ER (SR) 150 MG PO TB12: 150.0000 mg | ORAL_TABLET | Freq: Every morning | ORAL | 0 refills | Status: DC

## 2020-10-28 MED ORDER — BD PEN NEEDLE NANO U/F 32G X 4 MM MISC: 1.0000 | Freq: Every day | 0 refills | Status: DC

## 2020-10-29 NOTE — Progress Notes (Signed)
Chief Complaint:   OBESITY Bryan [name] is here to discuss his progress with his obesity treatment plan along with follow-up of his obesity related diagnoses. Bryan [name] is on the Category 3 Plan and states he is following his eating plan approximately 60% of the time. Bryan [name] states he is walking and doing yoga for 15-20 minutes 5 times per week.  Today's visit was #: 36 Starting weight: 326 lbs Starting date: 09/07/2017 Today's weight: 326 lbs Today's date: 10/28/2020 Total lbs lost to date: 0 Total lbs lost since last in-office visit: 0  Interim History: Bryan [name] is retaining some fluid today. He is dealing with his stress level being increased with his wife's recent seizure diagnosis, and him having to do all the driving for the family. He is making plans for Thanksgiving and trying to portion control and make smarter choices.   Subjective:   1. Vitamin D deficiency Bryan [name] is stable on Vit D, and his last level was at goal. He denies nausea or vomiting.  2. Other depression with emotional eating Bryan [name] has had increased stress recently but seems to be dealing with it reasonably well. He is working on [employer], and being mindful of his food choices.  3. At risk for hyperglycemia Bryan [name] is at increased risk for hyperglycemia due to increased temptations over the holidays.  Assessment/Plan:   1. Vitamin D deficiency Low Vitamin D level contributes to fatigue and are associated with obesity, breast, and colon cancer. We will refill prescription Vitamin D for 1 month, and we will recheck labs in 1 month. [name] will follow-up for routine testing of Vitamin D, at least 2-3 times per year to avoid over-replacement.  - Vitamin D, Ergocalciferol, (DRISDOL) 1.25 MG (50000 UNIT) CAPS capsule; Take 1 capsule (50,000 Units total) by mouth every 7 (seven) days.  Dispense: 4 capsule; Refill: 0  2. Other depression with emotional eating Behavior modification techniques were  discussed today to help [name] deal with his emotional/non-hunger eating behaviors. We will refill Wellbutrin SR for 1 month. Orders and follow up as documented in patient record.   - buPROPion (WELLBUTRIN SR) 150 MG 12 hr tablet; Take 1 tablet (150 mg total) by mouth in the morning.  Dispense: 30 tablet; Refill: 0  3. At risk for hyperglycemia Bryan [name] was given approximately 15 minutes of counseling today regarding prevention of hyperglycemia. He was advised of hyperglycemia causes and the fact hyperglycemia is often asymptomatic. [name] was instructed to avoid skipping meals, eat regular protein rich meals and schedule low calorie but protein rich snacks as needed.   Repetitive spaced learning was employed today to elicit superior memory formation and behavioral change  4. Class 3 severe obesity with serious comorbidity and body mass index (BMI) of 45.0 to 49.9 in adult, unspecified obesity type (HCC) Tc is currently in the action stage of change. As such, his goal is to continue with weight loss efforts. He has agreed to the Category 3 Plan or practicing portion control and making smarter food choices, such as increasing vegetables and decreasing simple carbohydrates.   We discussed various medication options to help Bryan [name] with his weight loss efforts and we both agreed to continue Saxenda, and we will refill nano needles #100 with no refills.  - Insulin Pen Needle (BD PEN NEEDLE NANO U/F) 32G X 4 MM MISC; 1 each by Does not apply route daily. Use one pen needle once daily to inject Saxenda.  Dispense: 100 each; Refill: 0  Exercise goals: As  is.  Behavioral modification strategies: increasing lean protein intake, meal planning and cooking strategies, holiday eating strategies  and celebration eating strategies.  Bryan [name] has agreed to follow-up with our clinic in 2 to 3 weeks. He was informed of the importance of frequent follow-up visits to maximize his success with intensive  lifestyle modifications for his multiple health conditions.   Objective:   Blood pressure 126/70, pulse 76, temperature (!) 97.4 F (36.3 C), height 5\' 10"  (1.778 m), weight (!) 326 lb (147.9 kg), SpO2 96 %. Body mass index is 46.78 kg/m.  General: Cooperative, alert, well developed, in no acute distress. HEENT: Conjunctivae and lids unremarkable. Cardiovascular: Regular rhythm.  Lungs: Normal work of breathing. Neurologic: No focal deficits.   Lab Results  Component Value Date   CREATININE 1.01 05/29/2020   BUN 17 05/29/2020   NA 140 05/29/2020   K 4.3 05/29/2020   CL 100 05/29/2020   CO2 23 05/29/2020   Lab Results  Component Value Date   ALT 31 05/29/2020   AST 19 05/29/2020   ALKPHOS 54 05/29/2020   BILITOT 0.4 05/29/2020   Lab Results  Component Value Date   HGBA1C 6.3 (H) 09/11/2020   HGBA1C 6.3 (H) 05/29/2020   HGBA1C 6.4 (H) 01/10/2020   HGBA1C 6.2 07/07/2019   HGBA1C 5.9 (H) 11/17/2018   Lab Results  Component Value Date   INSULIN 20.9 05/29/2020   INSULIN 24.0 01/10/2020   INSULIN 21.9 11/17/2018   INSULIN 24.0 08/18/2018   INSULIN 33.0 (H) 02/17/2018   Lab Results  Component Value Date   TSH 1.560 01/10/2020   Lab Results  Component Value Date   CHOL 179 05/29/2020   HDL 43 05/29/2020   LDLCALC 97 05/29/2020   LDLDIRECT 136.0 11/16/2016   TRIG 232 (H) 05/29/2020   CHOLHDL 4 07/07/2019   Lab Results  Component Value Date   WBC 5.9 09/11/2020   HGB 15.2 09/11/2020   HCT 45.0 09/11/2020   MCV 85.9 09/11/2020   PLT 293 09/11/2020   No results found for: IRON, TIBC, FERRITIN  Attestation Statements:   Reviewed by clinician on day of visit: allergies, medications, problem list, medical history, surgical history, family history, social history, and previous encounter notes.   I, Trixie Dredge, am acting as transcriptionist for Dennard Nip, MD.  I have reviewed the above documentation for accuracy and completeness, and I agree with the  above. -  Dennard Nip, MD

## 2020-11-12 ENCOUNTER — Other Ambulatory Visit: Payer: Self-pay

## 2020-11-12 ENCOUNTER — Ambulatory Visit (INDEPENDENT_AMBULATORY_CARE_PROVIDER_SITE_OTHER): Payer: BC Managed Care – PPO

## 2020-11-12 DIAGNOSIS — Z23 Encounter for immunization: Secondary | ICD-10-CM | POA: Diagnosis not present

## 2020-11-15 ENCOUNTER — Ambulatory Visit (INDEPENDENT_AMBULATORY_CARE_PROVIDER_SITE_OTHER): Payer: BC Managed Care – PPO | Admitting: Psychologist

## 2020-11-15 DIAGNOSIS — Z9119 Patient's noncompliance with other medical treatment and regimen: Secondary | ICD-10-CM | POA: Diagnosis not present

## 2020-11-15 DIAGNOSIS — Z630 Problems in relationship with spouse or partner: Secondary | ICD-10-CM | POA: Diagnosis not present

## 2020-11-15 DIAGNOSIS — F411 Generalized anxiety disorder: Secondary | ICD-10-CM | POA: Diagnosis not present

## 2020-11-18 ENCOUNTER — Ambulatory Visit (INDEPENDENT_AMBULATORY_CARE_PROVIDER_SITE_OTHER): Payer: BC Managed Care – PPO | Admitting: Family Medicine

## 2020-11-18 ENCOUNTER — Other Ambulatory Visit: Payer: Self-pay

## 2020-11-18 ENCOUNTER — Encounter (INDEPENDENT_AMBULATORY_CARE_PROVIDER_SITE_OTHER): Payer: Self-pay | Admitting: Family Medicine

## 2020-11-18 VITALS — BP 145/75 | HR 83 | Temp 98.3°F | Ht 70.0 in | Wt 319.0 lb

## 2020-11-18 DIAGNOSIS — I10 Essential (primary) hypertension: Secondary | ICD-10-CM

## 2020-11-18 DIAGNOSIS — Z6841 Body Mass Index (BMI) 40.0 and over, adult: Secondary | ICD-10-CM | POA: Diagnosis not present

## 2020-11-18 DIAGNOSIS — E559 Vitamin D deficiency, unspecified: Secondary | ICD-10-CM

## 2020-11-18 DIAGNOSIS — Z9189 Other specified personal risk factors, not elsewhere classified: Secondary | ICD-10-CM | POA: Diagnosis not present

## 2020-11-18 MED ORDER — HYDROCHLOROTHIAZIDE 25 MG PO TABS: 25.0000 mg | ORAL_TABLET | Freq: Every day | ORAL | 0 refills | Status: DC

## 2020-11-18 MED ORDER — VITAMIN D (ERGOCALCIFEROL) 1.25 MG (50000 UNIT) PO CAPS: 50000.0000 [IU] | ORAL_CAPSULE | ORAL | 0 refills | Status: DC

## 2020-11-18 NOTE — Progress Notes (Signed)
Chief Complaint:   OBESITY Bryan [name] is here to discuss his progress with his obesity treatment plan along with follow-up of his obesity related diagnoses. Bryan [name] is on the Category 3 Plan and practicing portion control and making smarter food choices, such as increasing vegetables and decreasing simple carbohydrates and states he is following his eating plan approximately 70% of the time. Bryan [name] states he is rowing and on the treadmill for 10-15 minutes 4 times per week.  Today's visit was #: 46 Starting weight: 326 lbs Starting date: 09/07/2017 Today's weight: 319 lbs Today's date: 11/18/2020 Total lbs lost to date: 7 Total lbs lost since last in-office visit: 7  Interim History: Bryan [name] has done well with weight loss, even despite traveling and eating out more. He is on Saxenda and recently increased his dose to 1.2 mg. He notes mild GI upset but not enough to want to stop taking it.  Subjective:   1. Essential hypertension Bryan [name]'s blood pressure is slightly elevated today, normally well controlled on his medications. He denies chest pain.  2. Vitamin D deficiency Bryan [name] is stable on Vit D, and he denies nausea or vomiting. He requests a refill today.  3. At risk for heart disease Bryan [name] is at a higher than average risk for cardiovascular disease due to obesity.   Assessment/Plan:   1. Essential hypertension Bryan [name] is working on healthy weight loss and exercise to improve blood pressure control. We will watch for signs of hypotension as he continues his lifestyle modifications. We will refill hydrochlorothiazide for 1 month. We will recheck his blood pressure in 3 weeks, may need to adjust his medications if his blood pressure remains elevated.  - hydrochlorothiazide (HYDRODIURIL) 25 MG tablet; Take 1 tablet (25 mg total) by mouth daily.  Dispense: 30 tablet; Refill: 0  2. Vitamin D deficiency Low Vitamin D level contributes to fatigue and are associated with obesity,  breast, and colon cancer. We will refill prescription Vitamin D for 1 month. Bryan [name] will follow-up for routine testing of Vitamin D, at least 2-3 times per year to avoid over-replacement.  - Vitamin D, Ergocalciferol, (DRISDOL) 1.25 MG (50000 UNIT) CAPS capsule; Take 1 capsule (50,000 Units total) by mouth every 7 (seven) days.  Dispense: 4 capsule; Refill: 0  3. At risk for heart disease Bryan [name] was given approximately 15 minutes of coronary artery disease prevention counseling today. He is 59 y.o. male and has risk factors for heart disease including obesity. We discussed intensive lifestyle modifications today with an emphasis on specific weight loss instructions and strategies.   Repetitive spaced learning was employed today to elicit superior memory formation and behavioral change.  4. Class 3 severe obesity with serious comorbidity and body mass index (BMI) of 45.0 to 49.9 in adult, unspecified obesity type (HCC) Bryan [name] is currently in the action stage of change. As such, his goal is to continue with weight loss efforts. He has agreed to the Category 3 Plan.   We discussed various medication options to help Bryan [name] with his weight loss efforts and we both agreed to continue Saxenda at 1.2 mg, and will continue to follow closely.  Exercise goals: As is.  Behavioral modification strategies: increasing lean protein intake and holiday eating strategies .  Bryan [name] has agreed to follow-up with our clinic in 3 weeks. He was informed of the importance of frequent follow-up visits to maximize his success with intensive lifestyle modifications for his multiple health conditions.   Objective:   Blood pressure (!) 145/75,  pulse 83, temperature 98.3 F (36.8 C), height 5\' 10"  (1.778 m), weight (!) 319 lb (144.7 kg), SpO2 98 %. Body mass index is 45.77 kg/m.  General: Cooperative, alert, well developed, in no acute distress. HEENT: Conjunctivae and lids unremarkable. Cardiovascular: Regular  rhythm.  Lungs: Normal work of breathing. Neurologic: No focal deficits.   Lab Results  Component Value Date   CREATININE 1.01 05/29/2020   BUN 17 05/29/2020   NA 140 05/29/2020   K 4.3 05/29/2020   CL 100 05/29/2020   CO2 23 05/29/2020   Lab Results  Component Value Date   ALT 31 05/29/2020   AST 19 05/29/2020   ALKPHOS 54 05/29/2020   BILITOT 0.4 05/29/2020   Lab Results  Component Value Date   HGBA1C 6.3 (H) 09/11/2020   HGBA1C 6.3 (H) 05/29/2020   HGBA1C 6.4 (H) 01/10/2020   HGBA1C 6.2 07/07/2019   HGBA1C 5.9 (H) 11/17/2018   Lab Results  Component Value Date   INSULIN 20.9 05/29/2020   INSULIN 24.0 01/10/2020   INSULIN 21.9 11/17/2018   INSULIN 24.0 08/18/2018   INSULIN 33.0 (H) 02/17/2018   Lab Results  Component Value Date   TSH 1.560 01/10/2020   Lab Results  Component Value Date   CHOL 179 05/29/2020   HDL 43 05/29/2020   LDLCALC 97 05/29/2020   LDLDIRECT 136.0 11/16/2016   TRIG 232 (H) 05/29/2020   CHOLHDL 4 07/07/2019   Lab Results  Component Value Date   WBC 5.9 09/11/2020   HGB 15.2 09/11/2020   HCT 45.0 09/11/2020   MCV 85.9 09/11/2020   PLT 293 09/11/2020   No results found for: IRON, TIBC, FERRITIN  Attestation Statements:   Reviewed by clinician on day of visit: allergies, medications, problem list, medical history, surgical history, family history, social history, and previous encounter notes.   I, Trixie Dredge, am acting as transcriptionist for Dennard Nip, MD.  I have reviewed the above documentation for accuracy and completeness, and I agree with the above. -  Dennard Nip, MD

## 2020-11-22 ENCOUNTER — Ambulatory Visit: Payer: BC Managed Care – PPO | Admitting: Psychologist

## 2020-12-02 ENCOUNTER — Encounter (INDEPENDENT_AMBULATORY_CARE_PROVIDER_SITE_OTHER): Payer: Self-pay

## 2020-12-02 ENCOUNTER — Other Ambulatory Visit (INDEPENDENT_AMBULATORY_CARE_PROVIDER_SITE_OTHER): Payer: Self-pay | Admitting: Family Medicine

## 2020-12-02 DIAGNOSIS — R7303 Prediabetes: Secondary | ICD-10-CM

## 2020-12-02 NOTE — Telephone Encounter (Signed)
Message sent to pt.

## 2020-12-04 ENCOUNTER — Other Ambulatory Visit: Payer: Self-pay | Admitting: Internal Medicine

## 2020-12-09 NOTE — Telephone Encounter (Signed)
Refill form given to MD.

## 2020-12-11 ENCOUNTER — Ambulatory Visit (INDEPENDENT_AMBULATORY_CARE_PROVIDER_SITE_OTHER): Payer: BC Managed Care – PPO | Admitting: Family Medicine

## 2020-12-11 ENCOUNTER — Other Ambulatory Visit: Payer: Self-pay

## 2020-12-11 ENCOUNTER — Encounter (INDEPENDENT_AMBULATORY_CARE_PROVIDER_SITE_OTHER): Payer: Self-pay | Admitting: Family Medicine

## 2020-12-11 VITALS — BP 125/75 | HR 62 | Temp 98.6°F | Ht 70.0 in | Wt 317.0 lb

## 2020-12-11 DIAGNOSIS — F3289 Other specified depressive episodes: Secondary | ICD-10-CM | POA: Diagnosis not present

## 2020-12-11 DIAGNOSIS — Z9189 Other specified personal risk factors, not elsewhere classified: Secondary | ICD-10-CM | POA: Diagnosis not present

## 2020-12-11 DIAGNOSIS — Z6841 Body Mass Index (BMI) 40.0 and over, adult: Secondary | ICD-10-CM

## 2020-12-16 NOTE — Progress Notes (Signed)
Chief Complaint:   OBESITY Bryan [name] is here to discuss his progress with his obesity treatment plan along with follow-up of his obesity related diagnoses. Bryan [name] is on the Category 3 Plan and states he is following his eating plan approximately 70% of the time. Bryan [name] states he is rowing 20 minutes 5 times per week.  Today's visit was #: 34 Starting weight: 326 lbs Starting date: 09/07/2017 Today's weight: 317 lbs Today's date: 12/11/2020 Total lbs lost to date: 9 Total lbs lost since last in-office visit: 2  Interim History: Bryan [name] did well minimizing weight gain over Christmas. He was able to avoid as many temptations and was able to portion control well.  Subjective:   1. Other depression with emotional eating Bryan [name] is doing well minimizing emotional eating behaviors even over the holidays. He is tolerating his medications well and his blood pressure today was well controlled.  2. At increased risk of exposure to COVID-19 virus Bryan [name] is at higher risk of COVID-19 infection due to higher infection rates locally.  Assessment/Plan:   1. Other depression with emotional eating Behavior modification techniques were discussed today to help Bryan [name] deal with his emotional/non-hunger eating behaviors. Bryan [name] is to continue Wellbutrin as is, and will continue to follow. Orders and follow up as documented in patient record.   2. At increased risk of exposure to COVID-19 virus Bryan [name] was given approximately 15 minutes of COVID prevention counseling today. COVID precautions and testing methods were discussed. Bryan [name] is fully vaccinated and boosted.  3. Class 3 severe obesity with serious comorbidity and body mass index (BMI) of 45.0 to 49.9 in adult, unspecified obesity type (HCC) Bryan [name] is currently in the action stage of change. As such, his goal is to continue with weight loss efforts. He has agreed to the Category 3 Plan.   We discussed various medication options to help  Bryan [name] with his weight loss efforts and we both agreed to continue Saxenda as is at 1.2 mg (no refill needed)..  Exercise goals: As is.  Behavioral modification strategies: increasing lean protein intake and emotional eating strategies.  Bryan [name] has agreed to follow-up with our clinic in 2 to 3 weeks. He was informed of the importance of frequent follow-up visits to maximize his success with intensive lifestyle modifications for his multiple health conditions.   Objective:   Blood pressure 125/75, pulse 62, temperature 98.6 F (37 C), height 5\' 10"  (1.778 m), weight (!) 317 lb (143.8 kg), SpO2 98 %. Body mass index is 45.48 kg/m.  General: Cooperative, alert, well developed, in no acute distress. HEENT: Conjunctivae and lids unremarkable. Cardiovascular: Regular rhythm.  Lungs: Normal work of breathing. Neurologic: No focal deficits.   Lab Results  Component Value Date   CREATININE 1.01 05/29/2020   BUN 17 05/29/2020   NA 140 05/29/2020   K 4.3 05/29/2020   CL 100 05/29/2020   CO2 23 05/29/2020   Lab Results  Component Value Date   ALT 31 05/29/2020   AST 19 05/29/2020   ALKPHOS 54 05/29/2020   BILITOT 0.4 05/29/2020   Lab Results  Component Value Date   HGBA1C 6.3 (H) 09/11/2020   HGBA1C 6.3 (H) 05/29/2020   HGBA1C 6.4 (H) 01/10/2020   HGBA1C 6.2 07/07/2019   HGBA1C 5.9 (H) 11/17/2018   Lab Results  Component Value Date   INSULIN 20.9 05/29/2020   INSULIN 24.0 01/10/2020   INSULIN 21.9 11/17/2018   INSULIN 24.0 08/18/2018   INSULIN 33.0 (H) 02/17/2018   Lab  Results  Component Value Date   TSH 1.560 01/10/2020   Lab Results  Component Value Date   CHOL 179 05/29/2020   HDL 43 05/29/2020   LDLCALC 97 05/29/2020   LDLDIRECT 136.0 11/16/2016   TRIG 232 (H) 05/29/2020   CHOLHDL 4 07/07/2019   Lab Results  Component Value Date   WBC 5.9 09/11/2020   HGB 15.2 09/11/2020   HCT 45.0 09/11/2020   MCV 85.9 09/11/2020   PLT 293 09/11/2020   No results  found for: IRON, TIBC, FERRITIN  Attestation Statements:   Reviewed by clinician on day of visit: allergies, medications, problem list, medical history, surgical history, family history, social history, and previous encounter notes.   I, Trixie Dredge, am acting as transcriptionist for Dennard Nip, MD.  I have reviewed the above documentation for accuracy and completeness, and I agree with the above. -  Dennard Nip, MD

## 2020-12-20 ENCOUNTER — Ambulatory Visit: Payer: BC Managed Care – PPO | Admitting: Psychologist

## 2020-12-20 ENCOUNTER — Ambulatory Visit (INDEPENDENT_AMBULATORY_CARE_PROVIDER_SITE_OTHER): Payer: BC Managed Care – PPO | Admitting: Psychologist

## 2020-12-20 DIAGNOSIS — Z9119 Patient's noncompliance with other medical treatment and regimen: Secondary | ICD-10-CM | POA: Diagnosis not present

## 2020-12-20 DIAGNOSIS — F411 Generalized anxiety disorder: Secondary | ICD-10-CM | POA: Diagnosis not present

## 2020-12-20 DIAGNOSIS — Z630 Problems in relationship with spouse or partner: Secondary | ICD-10-CM

## 2020-12-31 ENCOUNTER — Encounter (INDEPENDENT_AMBULATORY_CARE_PROVIDER_SITE_OTHER): Payer: Self-pay | Admitting: Family Medicine

## 2020-12-31 ENCOUNTER — Other Ambulatory Visit: Payer: Self-pay

## 2020-12-31 ENCOUNTER — Ambulatory Visit (INDEPENDENT_AMBULATORY_CARE_PROVIDER_SITE_OTHER): Payer: BC Managed Care – PPO | Admitting: Family Medicine

## 2020-12-31 VITALS — BP 148/81 | HR 78 | Temp 98.0°F | Ht 70.0 in | Wt 319.0 lb

## 2020-12-31 DIAGNOSIS — E559 Vitamin D deficiency, unspecified: Secondary | ICD-10-CM | POA: Diagnosis not present

## 2020-12-31 DIAGNOSIS — Z6841 Body Mass Index (BMI) 40.0 and over, adult: Secondary | ICD-10-CM

## 2020-12-31 DIAGNOSIS — F3289 Other specified depressive episodes: Secondary | ICD-10-CM

## 2020-12-31 DIAGNOSIS — Z9189 Other specified personal risk factors, not elsewhere classified: Secondary | ICD-10-CM

## 2020-12-31 DIAGNOSIS — I10 Essential (primary) hypertension: Secondary | ICD-10-CM

## 2020-12-31 MED ORDER — CHLORTHALIDONE 25 MG PO TABS: 25.0000 mg | ORAL_TABLET | Freq: Every day | ORAL | 0 refills | Status: DC

## 2020-12-31 MED ORDER — SAXENDA 18 MG/3ML ~~LOC~~ SOPN
3.0000 mg | PEN_INJECTOR | Freq: Every day | SUBCUTANEOUS | 0 refills | Status: DC
Start: 2020-12-31 — End: 2021-03-05

## 2020-12-31 MED ORDER — VITAMIN D (ERGOCALCIFEROL) 1.25 MG (50000 UNIT) PO CAPS: 50000.0000 [IU] | ORAL_CAPSULE | ORAL | 0 refills | Status: DC

## 2020-12-31 MED ORDER — BUPROPION HCL ER (SR) 150 MG PO TB12: 150.0000 mg | ORAL_TABLET | Freq: Every morning | ORAL | 0 refills | Status: DC

## 2021-01-01 NOTE — Progress Notes (Signed)
Chief Complaint:   OBESITY Bryan [name] is here to discuss his progress with his obesity treatment plan along with follow-up of his obesity related diagnoses. Bryan [name] is on the Category 3 Plan and states he is following his eating plan approximately 70% of the time. Bryan [name] states he is on the rowing machine for 20-30 minutes 5 times per week.  Today's visit was #: 45 Starting weight: 326 lbs Starting date: 09/07/2017 Today's weight: 319 lbs Today's date: 12/31/2020 Total lbs lost to date: 7 Total lbs lost since last in-office visit: 0  Interim History: Bryan [name] is retaining some fluid today. The bioimpedance scale states he is still losing fat. He is stable on Saxenda but still struggling with some hunger.  Subjective:   1. Essential hypertension Bryan [name]'s blood pressure is elevated and he is retaining some water weight. He is on hydrochlorothiazide already.  2. Vitamin D deficiency Bryan [name] is stable on Vit D, and he denies nausea, vomiting, or muscle weakness.  3. Other depression with emotional eating Bryan [name] has stress at work, but he is doing well minimizing emotional eating behaviors. He is sleeping well overall with occasional insomnia.  4. At risk for heart disease Bryan [name] is at a higher than average risk for cardiovascular disease due to obesity.   Assessment/Plan:   1. Essential hypertension Bryan [name] is working on healthy weight loss and exercise to improve blood pressure control. We will watch for signs of hypotension as he continues his lifestyle modifications. Bryan [name] agreed to hold hydrochlorothiazide, and start chlorthalidone 25 mg q daily with no refills.  - chlorthalidone (HYGROTON) 25 MG tablet; Take 1 tablet (25 mg total) by mouth daily.  Dispense: 30 tablet; Refill: 0  2. Vitamin D deficiency Low Vitamin D level contributes to fatigue and are associated with obesity, breast, and colon cancer. We will refill prescription Vitamin D for 1 month. Bryan [name] will  follow-up for routine testing of Vitamin D, at least 2-3 times per year to avoid over-replacement.  - Vitamin D, Ergocalciferol, (DRISDOL) 1.25 MG (50000 UNIT) CAPS capsule; Take 1 capsule (50,000 Units total) by mouth every 7 (seven) days.  Dispense: 4 capsule; Refill: 0  3. Other depression with emotional eating Behavior modification techniques were discussed today to help Bryan [name] deal with his emotional/non-hunger eating behaviors. We will refill Wellbutrin SR for 1 month. Orders and follow up as documented in patient record.   - buPROPion (WELLBUTRIN SR) 150 MG 12 hr tablet; Take 1 tablet (150 mg total) by mouth in the morning.  Dispense: 30 tablet; Refill: 0  4. At risk for heart disease Bryan [name] was given approximately 15 minutes of coronary artery disease prevention counseling today. He is 61 y.o. male and has risk factors for heart disease including obesity. We discussed intensive lifestyle modifications today with an emphasis on specific weight loss instructions and strategies.   Repetitive spaced learning was employed today to elicit superior memory formation and behavioral change.  5. Class 3 severe obesity with serious comorbidity and body mass index (BMI) of 45.0 to 49.9 in adult, unspecified obesity type (HCC) Bryan [name] is currently in the action stage of change. As such, his goal is to continue with weight loss efforts. He has agreed to the Category 3 Plan.   We discussed various medication options to help Bryan [name] with his weight loss efforts and we both agreed to increase Saxenda to 5 clicks past 1.2 mg (approximately 1.5). Ok to refill Saxenda at 3.0 mg for 1 month.   - Liraglutide -Weight  Management (SAXENDA) 18 MG/3ML SOPN; Inject 3 mg into the skin daily.  Dispense: 15 mL; Refill: 0  Exercise goals: As is.  Behavioral modification strategies: meal planning and cooking strategies.  Bryan [name] has agreed to follow-up with our clinic in 3 weeks. He was informed of the importance  of frequent follow-up visits to maximize his success with intensive lifestyle modifications for his multiple health conditions.   Objective:   Blood pressure (!) 148/81, pulse 78, temperature 98 F (36.7 C), height 5\' 10"  (1.778 m), weight (!) 319 lb (144.7 kg), SpO2 97 %. Body mass index is 45.77 kg/m.  General: Cooperative, alert, well developed, in no acute distress. HEENT: Conjunctivae and lids unremarkable. Cardiovascular: Regular rhythm.  Lungs: Normal work of breathing. Neurologic: No focal deficits.   Lab Results  Component Value Date   CREATININE 1.01 05/29/2020   BUN 17 05/29/2020   NA 140 05/29/2020   K 4.3 05/29/2020   CL 100 05/29/2020   CO2 23 05/29/2020   Lab Results  Component Value Date   ALT 31 05/29/2020   AST 19 05/29/2020   ALKPHOS 54 05/29/2020   BILITOT 0.4 05/29/2020   Lab Results  Component Value Date   HGBA1C 6.3 (H) 09/11/2020   HGBA1C 6.3 (H) 05/29/2020   HGBA1C 6.4 (H) 01/10/2020   HGBA1C 6.2 07/07/2019   HGBA1C 5.9 (H) 11/17/2018   Lab Results  Component Value Date   INSULIN 20.9 05/29/2020   INSULIN 24.0 01/10/2020   INSULIN 21.9 11/17/2018   INSULIN 24.0 08/18/2018   INSULIN 33.0 (H) 02/17/2018   Lab Results  Component Value Date   TSH 1.560 01/10/2020   Lab Results  Component Value Date   CHOL 179 05/29/2020   HDL 43 05/29/2020   LDLCALC 97 05/29/2020   LDLDIRECT 136.0 11/16/2016   TRIG 232 (H) 05/29/2020   CHOLHDL 4 07/07/2019   Lab Results  Component Value Date   WBC 5.9 09/11/2020   HGB 15.2 09/11/2020   HCT 45.0 09/11/2020   MCV 85.9 09/11/2020   PLT 293 09/11/2020   No results found for: IRON, TIBC, FERRITIN  Attestation Statements:   Reviewed by clinician on day of visit: allergies, medications, problem list, medical history, surgical history, family history, social history, and previous encounter notes.   I, Trixie Dredge, am acting as transcriptionist for Dennard Nip, MD.  I have reviewed the above  documentation for accuracy and completeness, and I agree with the above. -  Dennard Nip, MD

## 2021-01-03 ENCOUNTER — Ambulatory Visit: Payer: BC Managed Care – PPO | Admitting: Psychologist

## 2021-01-03 ENCOUNTER — Other Ambulatory Visit (INDEPENDENT_AMBULATORY_CARE_PROVIDER_SITE_OTHER): Payer: Self-pay | Admitting: Family Medicine

## 2021-01-03 DIAGNOSIS — R7303 Prediabetes: Secondary | ICD-10-CM

## 2021-01-03 NOTE — Telephone Encounter (Signed)
Last OV with Dr. Beasley 

## 2021-01-17 ENCOUNTER — Ambulatory Visit: Payer: BC Managed Care – PPO | Admitting: Psychologist

## 2021-01-21 ENCOUNTER — Encounter (INDEPENDENT_AMBULATORY_CARE_PROVIDER_SITE_OTHER): Payer: Self-pay | Admitting: Family Medicine

## 2021-01-21 ENCOUNTER — Other Ambulatory Visit: Payer: Self-pay

## 2021-01-21 ENCOUNTER — Ambulatory Visit (INDEPENDENT_AMBULATORY_CARE_PROVIDER_SITE_OTHER): Payer: BC Managed Care – PPO | Admitting: Family Medicine

## 2021-01-21 VITALS — BP 141/80 | HR 91 | Temp 98.5°F | Ht 70.0 in | Wt 318.0 lb

## 2021-01-21 DIAGNOSIS — E66813 Obesity, class 3: Secondary | ICD-10-CM

## 2021-01-21 DIAGNOSIS — Z9189 Other specified personal risk factors, not elsewhere classified: Secondary | ICD-10-CM | POA: Diagnosis not present

## 2021-01-21 DIAGNOSIS — F3289 Other specified depressive episodes: Secondary | ICD-10-CM

## 2021-01-21 DIAGNOSIS — E559 Vitamin D deficiency, unspecified: Secondary | ICD-10-CM | POA: Diagnosis not present

## 2021-01-21 DIAGNOSIS — Z6841 Body Mass Index (BMI) 40.0 and over, adult: Secondary | ICD-10-CM

## 2021-01-21 DIAGNOSIS — I10 Essential (primary) hypertension: Secondary | ICD-10-CM

## 2021-01-21 MED ORDER — CHLORTHALIDONE 25 MG PO TABS: 25.0000 mg | ORAL_TABLET | Freq: Every day | ORAL | 0 refills | Status: DC

## 2021-01-21 MED ORDER — VITAMIN D (ERGOCALCIFEROL) 1.25 MG (50000 UNIT) PO CAPS: 50000.0000 [IU] | ORAL_CAPSULE | ORAL | 0 refills | Status: DC

## 2021-01-21 MED ORDER — BUPROPION HCL ER (SR) 150 MG PO TB12: 150.0000 mg | ORAL_TABLET | Freq: Every morning | ORAL | 0 refills | Status: DC

## 2021-01-22 ENCOUNTER — Encounter (INDEPENDENT_AMBULATORY_CARE_PROVIDER_SITE_OTHER): Payer: Self-pay | Admitting: Family Medicine

## 2021-01-27 NOTE — Progress Notes (Signed)
Chief Complaint:   OBESITY Bryan [name] is here to discuss his progress with his obesity treatment plan along with follow-up of his obesity related diagnoses. [name] is on the Category 3 Plan and states he is following his eating plan approximately 70% of the time. [name] states he is walking and rowing for 60 minutes 4 times per week.  Today's visit was #: 46 Starting weight: 326 lbs Starting date: 09/07/2017 Today's weight: 318 lbs Today's date: 01/21/2021 Total lbs lost to date: 8 Total lbs lost since last in-office visit: 1  Interim History: Bryan [name] continues to do well with weight loss. He has had a lot of ongoing work stress, but she is making some good choices. He is exercising regularly. He is doing better with meal planning and prepping.  Subjective:   1. Vitamin D deficiency [name] is stable on Vit D, and he is due to have labs checked.  2. Essential hypertension [name]'s blood pressure is slightly elevated today. It has been better controlled in the past. He denies chest pain.  3. Other depression with emotional eating [name] is stable on Wellbutrin, and he is working on decreasing emotional eating behaviors.   4. At risk for diabetes mellitus Bryan [name] is at higher than average risk for developing diabetes due to obesity.   Assessment/Plan:   1. Vitamin D deficiency Low Vitamin D level contributes to fatigue and are associated with obesity, breast, and colon cancer. We will refill prescription Vitamin D for 1 month. Reco will follow-up for routine testing of Vitamin D, at least 2-3 times per year to avoid over-replacement.  - Vitamin D, Ergocalciferol, (DRISDOL) 1.25 MG (50000 UNIT) CAPS capsule; Take 1 capsule (50,000 Units total) by mouth every 7 (seven) days.  Dispense: 4 capsule; Refill: 0  2. Essential hypertension Tatsuo is working on healthy weight loss and exercise to improve blood pressure control. We will watch for signs of hypotension as he  continues his lifestyle modifications. We will refill chlorthalidone for 1 month, and we will recheck his blood pressure in 2 weeks.  - chlorthalidone (HYGROTON) 25 MG tablet; Take 1 tablet (25 mg total) by mouth daily.  Dispense: 30 tablet; Refill: 0  3. Other depression with emotional eating Behavior modification techniques were discussed today to help Sander deal with his emotional/non-hunger eating behaviors. We will refill Wellbutrin SR for 1 month. Orders and follow up as documented in patient record.   - buPROPion (WELLBUTRIN SR) 150 MG 12 hr tablet; Take 1 tablet (150 mg total) by mouth in the morning.  Dispense: 30 tablet; Refill: 0  4. At risk for diabetes mellitus Bryan [name] was given approximately 15 minutes of diabetes education and counseling today. We discussed intensive lifestyle modifications today with an emphasis on weight loss as well as increasing exercise and decreasing simple carbohydrates in his diet. We also reviewed medication options with an emphasis on risk versus benefit of those discussed.   Repetitive spaced learning was employed today to elicit superior memory formation and behavioral change.  5. Class 3 severe obesity with serious comorbidity and body mass index (BMI) of 45.0 to 49.9 in adult, unspecified obesity type (HCC) Nashton is currently in the action stage of change. As such, his goal is to continue with weight loss efforts. He has agreed to the Category 3 Plan.   We discussed various medication options to help [name] with his weight loss efforts and we both agreed to continue Saxenda as is.  Exercise goals: As is.  Behavioral  modification strategies: increasing lean protein intake and decreasing simple carbohydrates.  Bryan [name] has agreed to follow-up with our clinic in 2 weeks. He was informed of the importance of frequent follow-up visits to maximize his success with intensive lifestyle modifications for his multiple health conditions.   Objective:    Blood pressure (!) 141/80, pulse 91, temperature 98.5 F (36.9 C), height 5\' 10"  (1.778 m), weight (!) 318 lb (144.2 kg), SpO2 98 %. Body mass index is 45.63 kg/m.  General: Cooperative, alert, well developed, in no acute distress. HEENT: Conjunctivae and lids unremarkable. Cardiovascular: Regular rhythm.  Lungs: Normal work of breathing. Neurologic: No focal deficits.   Lab Results  Component Value Date   CREATININE 1.01 05/29/2020   BUN 17 05/29/2020   NA 140 05/29/2020   K 4.3 05/29/2020   CL 100 05/29/2020   CO2 23 05/29/2020   Lab Results  Component Value Date   ALT 31 05/29/2020   AST 19 05/29/2020   ALKPHOS 54 05/29/2020   BILITOT 0.4 05/29/2020   Lab Results  Component Value Date   HGBA1C 6.3 (H) 09/11/2020   HGBA1C 6.3 (H) 05/29/2020   HGBA1C 6.4 (H) 01/10/2020   HGBA1C 6.2 07/07/2019   HGBA1C 5.9 (H) 11/17/2018   Lab Results  Component Value Date   INSULIN 20.9 05/29/2020   INSULIN 24.0 01/10/2020   INSULIN 21.9 11/17/2018   INSULIN 24.0 08/18/2018   INSULIN 33.0 (H) 02/17/2018   Lab Results  Component Value Date   TSH 1.560 01/10/2020   Lab Results  Component Value Date   CHOL 179 05/29/2020   HDL 43 05/29/2020   LDLCALC 97 05/29/2020   LDLDIRECT 136.0 11/16/2016   TRIG 232 (H) 05/29/2020   CHOLHDL 4 07/07/2019   Lab Results  Component Value Date   WBC 5.9 09/11/2020   HGB 15.2 09/11/2020   HCT 45.0 09/11/2020   MCV 85.9 09/11/2020   PLT 293 09/11/2020   No results found for: IRON, TIBC, FERRITIN  Attestation Statements:   Reviewed by clinician on day of visit: allergies, medications, problem list, medical history, surgical history, family history, social history, and previous encounter notes.   I, Trixie Dredge, am acting as transcriptionist for Dennard Nip, MD.  I have reviewed the above documentation for accuracy and completeness, and I agree with the above. -  Dennard Nip, MD

## 2021-02-10 ENCOUNTER — Encounter (INDEPENDENT_AMBULATORY_CARE_PROVIDER_SITE_OTHER): Payer: Self-pay | Admitting: Family Medicine

## 2021-02-10 ENCOUNTER — Other Ambulatory Visit: Payer: Self-pay

## 2021-02-10 ENCOUNTER — Ambulatory Visit (INDEPENDENT_AMBULATORY_CARE_PROVIDER_SITE_OTHER): Payer: BC Managed Care – PPO | Admitting: Family Medicine

## 2021-02-10 VITALS — BP 146/80 | HR 84 | Temp 98.0°F | Ht 70.0 in | Wt 316.0 lb

## 2021-02-10 DIAGNOSIS — I10 Essential (primary) hypertension: Secondary | ICD-10-CM

## 2021-02-10 DIAGNOSIS — G4709 Other insomnia: Secondary | ICD-10-CM | POA: Diagnosis not present

## 2021-02-10 DIAGNOSIS — Z9189 Other specified personal risk factors, not elsewhere classified: Secondary | ICD-10-CM

## 2021-02-10 DIAGNOSIS — E559 Vitamin D deficiency, unspecified: Secondary | ICD-10-CM | POA: Diagnosis not present

## 2021-02-10 DIAGNOSIS — F3289 Other specified depressive episodes: Secondary | ICD-10-CM

## 2021-02-10 DIAGNOSIS — R7303 Prediabetes: Secondary | ICD-10-CM

## 2021-02-10 DIAGNOSIS — Z6841 Body Mass Index (BMI) 40.0 and over, adult: Secondary | ICD-10-CM

## 2021-02-10 DIAGNOSIS — E7849 Other hyperlipidemia: Secondary | ICD-10-CM | POA: Diagnosis not present

## 2021-02-10 MED ORDER — LOSARTAN POTASSIUM-HCTZ 50-12.5 MG PO TABS: 1.0000 | ORAL_TABLET | Freq: Every day | ORAL | 0 refills | Status: DC

## 2021-02-10 MED ORDER — BUPROPION HCL ER (SR) 150 MG PO TB12: 150.0000 mg | ORAL_TABLET | Freq: Every morning | ORAL | 0 refills | Status: DC

## 2021-02-10 MED ORDER — VITAMIN D (ERGOCALCIFEROL) 1.25 MG (50000 UNIT) PO CAPS: 50000.0000 [IU] | ORAL_CAPSULE | ORAL | 0 refills | Status: DC

## 2021-02-11 LAB — COMPREHENSIVE METABOLIC PANEL
ALT: 34 IU/L (ref 0–44)
AST: 19 IU/L (ref 0–40)
Albumin/Globulin Ratio: 1.7 (ref 1.2–2.2)
Albumin: 4.6 g/dL (ref 3.8–4.9)
Alkaline Phosphatase: 62 IU/L (ref 44–121)
BUN/Creatinine Ratio: 15 (ref 9–20)
BUN: 13 mg/dL (ref 6–24)
Bilirubin Total: 0.5 mg/dL (ref 0.0–1.2)
CO2: 24 mmol/L (ref 20–29)
Calcium: 9.2 mg/dL (ref 8.7–10.2)
Chloride: 97 mmol/L (ref 96–106)
Creatinine, Ser: 0.86 mg/dL (ref 0.76–1.27)
Globulin, Total: 2.7 g/dL (ref 1.5–4.5)
Glucose: 80 mg/dL (ref 65–99)
Potassium: 3.3 mmol/L — ABNORMAL LOW (ref 3.5–5.2)
Sodium: 141 mmol/L (ref 134–144)
Total Protein: 7.3 g/dL (ref 6.0–8.5)
eGFR: 101 mL/min/{1.73_m2} (ref >59)

## 2021-02-11 LAB — LIPID PANEL WITH LDL/HDL RATIO
Cholesterol, Total: 157 mg/dL (ref 100–199)
HDL: 41 mg/dL (ref >39)
LDL Chol Calc (NIH): 79 mg/dL (ref 0–99)
LDL/HDL Ratio: 1.9 ratio (ref 0.0–3.6)
Triglycerides: 222 mg/dL — ABNORMAL HIGH (ref 0–149)
VLDL Cholesterol Cal: 37 mg/dL (ref 5–40)

## 2021-02-11 LAB — HEMOGLOBIN A1C
Est. average glucose Bld gHb Est-mCnc: 131 mg/dL
Hgb A1c MFr Bld: 6.2 % — ABNORMAL HIGH (ref 4.8–5.6)

## 2021-02-11 LAB — VITAMIN D 25 HYDROXY (VIT D DEFICIENCY, FRACTURES): Vit D, 25-Hydroxy: 47.1 ng/mL (ref 30.0–100.0)

## 2021-02-11 LAB — INSULIN, RANDOM: INSULIN: 22.6 u[IU]/mL (ref 2.6–24.9)

## 2021-02-12 NOTE — Progress Notes (Signed)
Chief Complaint:   OBESITY Bryan [name] is here to discuss his progress with his obesity treatment plan along with follow-up of his obesity related diagnoses. Bryan [name] is on the Category 3 Plan and states he is following his eating plan approximately 70% of the time. Bryan [name] states he is walking and rowing for 30 minutes 4 times per week.  Today's visit was #: 11 Starting weight: 326 lbs Starting date: 09/07/2017 Today's weight: 316 lbs Today's date: 02/10/2021 Total lbs lost to date: 10 Total lbs lost since last in-office visit: 2  Interim History: Bryan [name] continues to do well with weight loss. His hunger is mostly controlled. He is getting good family support. He is tolerating Saxenda, and he is on 5 clicks past 1.2 (approximately 1.5 mg) currently.  Subjective:   1. Essential hypertension Bryan [name]'s blood pressure is mildly elevated again. He denies chest pain or headache. He notes increased stress at work and with his wife's health and decreased sleep recently.  2. Vitamin D deficiency Bryan [name] is stable on Vit D, and he is due for labs.  3. Other insomnia Bryan [name] is doing well on melatonin. His wife sleeps poorly and she has had more frequent seizures, and this affects his sleep.  4. Other hyperlipidemia Bryan [name] is stable on Lipitor, and he is due for labs.  5. Pre-diabetes Bryan [name] is working on diet and exercise, and he is due for labs.  6. Other depression with emotional eating Bryan [name] has had increased stress but he appears to be dealing with this reasonably well. He continues to do better with decreasing emotional eating behaviors.  7. At risk for heart disease Bryan [name] is at a higher than average risk for cardiovascular disease due to obesity.   Assessment/Plan:   1. Essential hypertension Crosley agreed to discontinue chlorthalidone, and he will start losartan-hydrochlorothiazide 50-12.5 mg q daily with no refills. He will continue working on healthy weight loss and  exercise to improve blood pressure control. We will watch for signs of hypotension as he continues his lifestyle modifications. We will check labs today.  - Comprehensive metabolic panel - losartan-hydrochlorothiazide (HYZAAR) 50-12.5 MG tablet; Take 1 tablet by mouth daily.  Dispense: 30 tablet; Refill: 0  2. Vitamin D deficiency Low Vitamin D level contributes to fatigue and are associated with obesity, breast, and colon cancer. We will refill prescription Vitamin D for 1 month, and we will check labs today. Bryan [name] will follow-up for routine testing of Vitamin D, at least 2-3 times per year to avoid over-replacement.  - VITAMIN D 25 Hydroxy (Vit-D Deficiency, Fractures) - Vitamin D, Ergocalciferol, (DRISDOL) 1.25 MG (50000 UNIT) CAPS capsule; Take 1 capsule (50,000 Units total) by mouth every 7 (seven) days.  Dispense: 4 capsule; Refill: 0  3. Other insomnia The problem of recurrent insomnia was discussed. Orders and follow up as documented in patient record. Counseling: Intensive lifestyle modifications are the first line treatment for this issue. We discussed several lifestyle modifications today. Ezio is ok to continue melatonin as is, and will continue to monitor. He will continue to work on diet, exercise and weight loss efforts.   4. Other hyperlipidemia Cardiovascular risk and specific lipid/LDL goals reviewed. We discussed several lifestyle modifications today. We will check labs today. Bryan [name] will continue to work on diet, exercise and weight loss efforts. Orders and follow up as documented in patient record.   - Lipid Panel With LDL/HDL Ratio  5. Pre-diabetes Bryan [name] will continue to work on weight loss, exercise, and decreasing simple  carbohydrates to help decrease the risk of diabetes. We will check labs today.  - Insulin, random - Hemoglobin A1c  6. Other depression with emotional eating Behavior modification techniques were discussed today to help Bryan [name] deal with his  emotional/non-hunger eating behaviors. We will refill Wellbutrin SR for 1 month. Orders and follow up as documented in patient record.   - buPROPion (WELLBUTRIN SR) 150 MG 12 hr tablet; Take 1 tablet (150 mg total) by mouth in the morning.  Dispense: 30 tablet; Refill: 0  7. At risk for heart disease Bryan [name] was given approximately 15 minutes of coronary artery disease prevention counseling today. He is 60 y.o. male and has risk factors for heart disease including obesity. We discussed intensive lifestyle modifications today with an emphasis on specific weight loss instructions and strategies.   Repetitive spaced learning was employed today to elicit superior memory formation and behavioral change.  8. Class 3 severe obesity with serious comorbidity and body mass index (BMI) of 45.0 to 49.9 in adult, unspecified obesity type (HCC) Bryan [name] is currently in the action stage of change. As such, his goal is to continue with weight loss efforts. He has agreed to the Category 3 Plan.   We discussed various medication options to help Bryan [name] with his weight loss efforts and we both agreed to increase Saxenda to 1.8 mg (no refill needed).  Exercise goals: As is.  Behavioral modification strategies: increasing lean protein intake and meal planning and cooking strategies.  Bryan [name] has agreed to follow-up with our clinic in 2 to 3 weeks. He was informed of the importance of frequent follow-up visits to maximize his success with intensive lifestyle modifications for his multiple health conditions.   Bryan [name] was informed we would discuss his lab results at his next visit unless there is a critical issue that needs to be addressed sooner. Bryan [name] agreed to keep his next visit at the agreed upon time to discuss these results.  Objective:   Blood pressure (!) 146/80, pulse 84, temperature 98 F (36.7 C), temperature source Oral, height 5\' 10"  (1.778 m), weight (!) 316 lb (143.3 kg), SpO2 98 %. Body mass  index is 45.34 kg/m.  General: Cooperative, alert, well developed, in no acute distress. HEENT: Conjunctivae and lids unremarkable. Cardiovascular: Regular rhythm.  Lungs: Normal work of breathing. Neurologic: No focal deficits.   Lab Results  Component Value Date   CREATININE 0.86 02/10/2021   BUN 13 02/10/2021   NA 141 02/10/2021   K 3.3 (L) 02/10/2021   CL 97 02/10/2021   CO2 24 02/10/2021   Lab Results  Component Value Date   ALT 34 02/10/2021   AST 19 02/10/2021   ALKPHOS 62 02/10/2021   BILITOT 0.5 02/10/2021   Lab Results  Component Value Date   HGBA1C 6.2 (H) 02/10/2021   HGBA1C 6.3 (H) 09/11/2020   HGBA1C 6.3 (H) 05/29/2020   HGBA1C 6.4 (H) 01/10/2020   HGBA1C 6.2 07/07/2019   Lab Results  Component Value Date   INSULIN 22.6 02/10/2021   INSULIN 20.9 05/29/2020   INSULIN 24.0 01/10/2020   INSULIN 21.9 11/17/2018   INSULIN 24.0 08/18/2018   Lab Results  Component Value Date   TSH 1.560 01/10/2020   Lab Results  Component Value Date   CHOL 157 02/10/2021   HDL 41 02/10/2021   LDLCALC 79 02/10/2021   LDLDIRECT 136.0 11/16/2016   TRIG 222 (H) 02/10/2021   CHOLHDL 4 07/07/2019   Lab Results  Component Value Date   WBC  5.9 09/11/2020   HGB 15.2 09/11/2020   HCT 45.0 09/11/2020   MCV 85.9 09/11/2020   PLT 293 09/11/2020   No results found for: IRON, TIBC, FERRITIN  Attestation Statements:   Reviewed by clinician on day of visit: allergies, medications, problem list, medical history, surgical history, family history, social history, and previous encounter notes.   I, Trixie Dredge, am acting as transcriptionist for Dennard Nip, MD.  I have reviewed the above documentation for accuracy and completeness, and I agree with the above. -  Dennard Nip, MD

## 2021-02-14 ENCOUNTER — Ambulatory Visit: Payer: BC Managed Care – PPO | Admitting: Psychologist

## 2021-02-14 ENCOUNTER — Ambulatory Visit (INDEPENDENT_AMBULATORY_CARE_PROVIDER_SITE_OTHER): Payer: BC Managed Care – PPO | Admitting: Psychologist

## 2021-02-14 DIAGNOSIS — Z9119 Patient's noncompliance with other medical treatment and regimen: Secondary | ICD-10-CM

## 2021-02-14 DIAGNOSIS — Z630 Problems in relationship with spouse or partner: Secondary | ICD-10-CM

## 2021-02-14 DIAGNOSIS — F411 Generalized anxiety disorder: Secondary | ICD-10-CM | POA: Diagnosis not present

## 2021-03-05 ENCOUNTER — Ambulatory Visit (INDEPENDENT_AMBULATORY_CARE_PROVIDER_SITE_OTHER): Payer: BC Managed Care – PPO | Admitting: Family Medicine

## 2021-03-05 ENCOUNTER — Other Ambulatory Visit: Payer: Self-pay

## 2021-03-05 ENCOUNTER — Encounter (INDEPENDENT_AMBULATORY_CARE_PROVIDER_SITE_OTHER): Payer: Self-pay | Admitting: Family Medicine

## 2021-03-05 VITALS — BP 144/84 | HR 79 | Temp 97.8°F | Ht 70.0 in | Wt 322.0 lb

## 2021-03-05 DIAGNOSIS — Z9189 Other specified personal risk factors, not elsewhere classified: Secondary | ICD-10-CM | POA: Diagnosis not present

## 2021-03-05 DIAGNOSIS — I10 Essential (primary) hypertension: Secondary | ICD-10-CM | POA: Diagnosis not present

## 2021-03-05 DIAGNOSIS — F3289 Other specified depressive episodes: Secondary | ICD-10-CM

## 2021-03-05 DIAGNOSIS — E876 Hypokalemia: Secondary | ICD-10-CM | POA: Diagnosis not present

## 2021-03-05 DIAGNOSIS — Z6841 Body Mass Index (BMI) 40.0 and over, adult: Secondary | ICD-10-CM

## 2021-03-05 DIAGNOSIS — R7303 Prediabetes: Secondary | ICD-10-CM

## 2021-03-05 MED ORDER — LOSARTAN POTASSIUM-HCTZ 50-12.5 MG PO TABS: 1.0000 | ORAL_TABLET | Freq: Every day | ORAL | 0 refills | Status: DC

## 2021-03-05 MED ORDER — BUPROPION HCL ER (SR) 150 MG PO TB12: 150.0000 mg | ORAL_TABLET | Freq: Every morning | ORAL | 0 refills | Status: DC

## 2021-03-05 MED ORDER — SAXENDA 18 MG/3ML ~~LOC~~ SOPN: 3.0000 mg | PEN_INJECTOR | Freq: Every day | SUBCUTANEOUS | 0 refills | Status: DC

## 2021-03-05 MED ORDER — METFORMIN HCL 500 MG PO TABS
ORAL_TABLET | ORAL | 0 refills | Status: DC
Start: 2021-03-05 — End: 2021-04-09

## 2021-03-06 LAB — COMPREHENSIVE METABOLIC PANEL
ALT: 30 IU/L (ref 0–44)
AST: 19 IU/L (ref 0–40)
Albumin/Globulin Ratio: 1.4 (ref 1.2–2.2)
Albumin: 4.3 g/dL (ref 3.8–4.9)
Alkaline Phosphatase: 58 IU/L (ref 44–121)
BUN/Creatinine Ratio: 15 (ref 9–20)
BUN: 12 mg/dL (ref 6–24)
Bilirubin Total: 0.2 mg/dL (ref 0.0–1.2)
CO2: 22 mmol/L (ref 20–29)
Calcium: 9.5 mg/dL (ref 8.7–10.2)
Chloride: 101 mmol/L (ref 96–106)
Creatinine, Ser: 0.78 mg/dL (ref 0.76–1.27)
Globulin, Total: 3 g/dL (ref 1.5–4.5)
Glucose: 76 mg/dL (ref 65–99)
Potassium: 4.2 mmol/L (ref 3.5–5.2)
Sodium: 142 mmol/L (ref 134–144)
Total Protein: 7.3 g/dL (ref 6.0–8.5)
eGFR: 104 mL/min/{1.73_m2} (ref >59)

## 2021-03-12 NOTE — Progress Notes (Signed)
Chief Complaint:   OBESITY Bryan [name] is here to discuss his progress with his obesity treatment plan along with follow-up of his obesity related diagnoses. Bryan [name] is on the Category 3 Plan and states he is following his eating plan approximately 60% of the time. Bryan [name] states he is walking for 45 minutes 3-4 times per week.  Today's visit was #: 19 Starting weight: 326 lbs Starting date: 09/07/2017 Today's weight: 322 lbs Today's date: 03/05/2021 Total lbs lost to date: 4 Total lbs lost since last in-office visit: 0  Interim History: Bryan [name] is retaining some fluid today. He is working on diet and weight loss, but sometimes he struggles with meal planning. His wife is now a patient and this should make meal planning easier.  Subjective:   1. Essential hypertension Bryan [name]'s blood pressure at home ranges between 135-140/80 most days. He is on losartan-hydrochlorothiazide.  2. Pre-diabetes Bryan [name] is stable on metformin, and he is working on diet. He requests a refill today.  3. Hypokalemia Bryan [name]'s K+ is lower than expected. He is on hydrochlorothiazide at a low dose.  4. Other depression with emotional eating Bryan [name]'s mood is stable on Wellbutrin, and he is working on decreasing emotional eating behaviors. His blood pressure is slightly above goal.  5. At risk for heart disease Bryan [name] is at a higher than average risk for cardiovascular disease due to obesity.   Assessment/Plan:   1. Essential hypertension Bryan [name] will continue taking losartan-hydrochlorothiazide, and we will refill for 1 month. He will restart hydrochlorothiazide 12.5 mg q daily (no refill needed). He will continue working on healthy weight loss and exercise to improve blood pressure control. We will watch for signs of hypotension as he continues his lifestyle modifications.  - losartan-hydrochlorothiazide (HYZAAR) 50-12.5 MG tablet; Take 1 tablet by mouth daily.  Dispense: 30 tablet; Refill: 0  2.  Pre-diabetes Bryan [name] will continue to work on weight loss, exercise, and decreasing simple carbohydrates to help decrease the risk of diabetes. We will refill metformin for 1 month.  - metFORMIN (GLUCOPHAGE) 500 MG tablet; TAKE 1 TABLET(500 MG) BY MOUTH THREE TIMES DAILY  Dispense: 90 tablet; Refill: 0  3. Hypokalemia We will check labs today, and Bryan [name] will continue to follow up as directed.  - Comprehensive metabolic panel  4. Other depression with emotional eating Behavior modification techniques were discussed today to help Bryan [name] deal with his emotional/non-hunger eating behaviors. We will refill Wellbutrin SR for 1 month. Orders and follow up as documented in patient record.   - buPROPion (WELLBUTRIN SR) 150 MG 12 hr tablet; Take 1 tablet (150 mg total) by mouth in the morning.  Dispense: 30 tablet; Refill: 0  5. At risk for heart disease Bryan [name] was given approximately 15 minutes of coronary artery disease prevention counseling today. He is 60 y.o. male and has risk factors for heart disease including obesity. We discussed intensive lifestyle modifications today with an emphasis on specific weight loss instructions and strategies.   Repetitive spaced learning was employed today to elicit superior memory formation and behavioral change.  6. Class 3 severe obesity with serious comorbidity and body mass index (BMI) of 45.0 to 49.9 in adult, unspecified obesity type (HCC) Bryan [name] is currently in the action stage of change. As such, his goal is to continue with weight loss efforts. He has agreed to the Category 3 Plan.   We discussed various medication options to help Bryan [name] with his weight loss efforts and we both agreed to continue Saxenda, and we  will refill for 1 month.  - Liraglutide -Weight Management (SAXENDA) 18 MG/3ML SOPN; Inject 3 mg into the skin daily.  Dispense: 15 mL; Refill: 0  Exercise goals: As is.  Behavioral modification strategies: increasing water intake and  meal planning and cooking strategies.  Bryan [name] has agreed to follow-up with our clinic in 2 weeks. He was informed of the importance of frequent follow-up visits to maximize his success with intensive lifestyle modifications for his multiple health conditions.   Bryan [name] was informed we would discuss his lab results at his next visit unless there is a critical issue that needs to be addressed sooner. Bryan [name] agreed to keep his next visit at the agreed upon time to discuss these results.  Objective:   Blood pressure (!) 144/84, pulse 79, temperature 97.8 F (36.6 C), height 5\' 10"  (1.778 m), weight (!) 322 lb (146.1 kg), SpO2 96 %. Body mass index is 46.2 kg/m.  General: Cooperative, alert, well developed, in no acute distress. HEENT: Conjunctivae and lids unremarkable. Cardiovascular: Regular rhythm.  Lungs: Normal work of breathing. Neurologic: No focal deficits.   Lab Results  Component Value Date   CREATININE 0.78 03/05/2021   BUN 12 03/05/2021   NA 142 03/05/2021   K 4.2 03/05/2021   CL 101 03/05/2021   CO2 22 03/05/2021   Lab Results  Component Value Date   ALT 30 03/05/2021   AST 19 03/05/2021   ALKPHOS 58 03/05/2021   BILITOT 0.2 03/05/2021   Lab Results  Component Value Date   HGBA1C 6.2 (H) 02/10/2021   HGBA1C 6.3 (H) 09/11/2020   HGBA1C 6.3 (H) 05/29/2020   HGBA1C 6.4 (H) 01/10/2020   HGBA1C 6.2 07/07/2019   Lab Results  Component Value Date   INSULIN 22.6 02/10/2021   INSULIN 20.9 05/29/2020   INSULIN 24.0 01/10/2020   INSULIN 21.9 11/17/2018   INSULIN 24.0 08/18/2018   Lab Results  Component Value Date   TSH 1.560 01/10/2020   Lab Results  Component Value Date   CHOL 157 02/10/2021   HDL 41 02/10/2021   LDLCALC 79 02/10/2021   LDLDIRECT 136.0 11/16/2016   TRIG 222 (H) 02/10/2021   CHOLHDL 4 07/07/2019   Lab Results  Component Value Date   WBC 5.9 09/11/2020   HGB 15.2 09/11/2020   HCT 45.0 09/11/2020   MCV 85.9 09/11/2020   PLT 293  09/11/2020   No results found for: IRON, TIBC, FERRITIN  Attestation Statements:   Reviewed by clinician on day of visit: allergies, medications, problem list, medical history, surgical history, family history, social history, and previous encounter notes.   I, Trixie Dredge, am acting as transcriptionist for Dennard Nip, MD.  I have reviewed the above documentation for accuracy and completeness, and I agree with the above. -  Dennard Nip, MD

## 2021-03-18 ENCOUNTER — Encounter: Payer: Self-pay | Admitting: Internal Medicine

## 2021-03-19 ENCOUNTER — Encounter (INDEPENDENT_AMBULATORY_CARE_PROVIDER_SITE_OTHER): Payer: Self-pay | Admitting: Family Medicine

## 2021-03-19 ENCOUNTER — Ambulatory Visit (INDEPENDENT_AMBULATORY_CARE_PROVIDER_SITE_OTHER): Payer: BC Managed Care – PPO | Admitting: Family Medicine

## 2021-03-19 ENCOUNTER — Other Ambulatory Visit: Payer: Self-pay

## 2021-03-19 VITALS — BP 121/73 | HR 87 | Temp 98.2°F | Ht 70.0 in | Wt 317.0 lb

## 2021-03-19 DIAGNOSIS — Z6841 Body Mass Index (BMI) 40.0 and over, adult: Secondary | ICD-10-CM | POA: Diagnosis not present

## 2021-03-19 DIAGNOSIS — Z9189 Other specified personal risk factors, not elsewhere classified: Secondary | ICD-10-CM

## 2021-03-19 DIAGNOSIS — E559 Vitamin D deficiency, unspecified: Secondary | ICD-10-CM

## 2021-03-19 DIAGNOSIS — I10 Essential (primary) hypertension: Secondary | ICD-10-CM | POA: Diagnosis not present

## 2021-03-19 MED ORDER — VITAMIN D (ERGOCALCIFEROL) 1.25 MG (50000 UNIT) PO CAPS: 50000.0000 [IU] | ORAL_CAPSULE | ORAL | 0 refills | Status: DC

## 2021-03-27 NOTE — Progress Notes (Signed)
Chief Complaint:   OBESITY Bryan [name] is here to discuss his progress with his obesity treatment plan along with follow-up of his obesity related diagnoses. Bryan [name] is on the Category 3 Plan and states he is following his eating plan approximately 60% of the time. Bryan [name] states he is walking for 45 minutes 4-5 times per week.  Today's visit was #: 32 Starting weight: 326 lbs Starting date: 09/07/2017 Today's weight: 317 lbs Today's date: 03/19/2021 Total lbs lost to date: 9 Total lbs lost since last in-office visit: 5  Interim History: Bryan [name] has done well with weight loss on his plan. His wife is also working on weight loss, and this appears to be especially helpful. He increased his Saxenda to 5 clicks past 1.8 mg.  Subjective:   1. Vitamin D deficiency Bryan [name]'s Vit D is stable on his medication. He requests a refill today.  2. Essential hypertension Bryan [name]'s blood pressure is well controlled on his medications. He is doing well with weight loss and is exercising more. He denies chest pain or headache.  3. At risk for heart disease Bryan [name] is at a higher than average risk for cardiovascular disease due to obesity.   Assessment/Plan:   1. Vitamin D deficiency Low Vitamin D level contributes to fatigue and are associated with obesity, breast, and colon cancer. We will refill prescription Vitamin D for 1 month. He will follow-up for routine testing of Vitamin D, at least 2-3 times per year to avoid over-replacement.  - Vitamin D, Ergocalciferol, (DRISDOL) 1.25 MG (50000 UNIT) CAPS capsule; Take 1 capsule (50,000 Units total) by mouth every 7 (seven) days.  Dispense: 4 capsule; Refill: 0  2. Essential hypertension Bryan [name] will continue diet, exercise, and his medications to improve blood pressure control. We will continue to monitor as he continues his lifestyle modifications.  3. At risk for heart disease Bryan [name] was given approximately 15 minutes of coronary artery disease  prevention counseling today. He is 60 y.o. male and has risk factors for heart disease including obesity. We discussed intensive lifestyle modifications today with an emphasis on specific weight loss instructions and strategies.   Repetitive spaced learning was employed today to elicit superior memory formation and behavioral change.  4. Obesity with current BMI of 45.6 Bryan [name] is currently in the action stage of change. As such, his goal is to continue with weight loss efforts. He has agreed to the Category 3 Plan.   We discussed various medication options to help Bryan [name] with his weight loss efforts and we both agreed to continue Saxenda, and will continue to monitor.  Exercise goals: As is.  Behavioral modification strategies: increasing lean protein intake.  Bryan [name] has agreed to follow-up with our clinic in 3 weeks. He was informed of the importance of frequent follow-up visits to maximize his success with intensive lifestyle modifications for his multiple health conditions.   Objective:   Blood pressure 121/73, pulse 87, temperature 98.2 F (36.8 C), height 5\' 10"  (1.778 m), weight (!) 317 lb (143.8 kg), SpO2 96 %. Body mass index is 45.48 kg/m.  General: Cooperative, alert, well developed, in no acute distress. HEENT: Conjunctivae and lids unremarkable. Cardiovascular: Regular rhythm.  Lungs: Normal work of breathing. Neurologic: No focal deficits.   Lab Results  Component Value Date   CREATININE 0.78 03/05/2021   BUN 12 03/05/2021   NA 142 03/05/2021   K 4.2 03/05/2021   CL 101 03/05/2021   CO2 22 03/05/2021   Lab Results  Component Value  Date   ALT 30 03/05/2021   AST 19 03/05/2021   ALKPHOS 58 03/05/2021   BILITOT 0.2 03/05/2021   Lab Results  Component Value Date   HGBA1C 6.2 (H) 02/10/2021   HGBA1C 6.3 (H) 09/11/2020   HGBA1C 6.3 (H) 05/29/2020   HGBA1C 6.4 (H) 01/10/2020   HGBA1C 6.2 07/07/2019   Lab Results  Component Value Date   INSULIN 22.6  02/10/2021   INSULIN 20.9 05/29/2020   INSULIN 24.0 01/10/2020   INSULIN 21.9 11/17/2018   INSULIN 24.0 08/18/2018   Lab Results  Component Value Date   TSH 1.560 01/10/2020   Lab Results  Component Value Date   CHOL 157 02/10/2021   HDL 41 02/10/2021   LDLCALC 79 02/10/2021   LDLDIRECT 136.0 11/16/2016   TRIG 222 (H) 02/10/2021   CHOLHDL 4 07/07/2019   Lab Results  Component Value Date   WBC 5.9 09/11/2020   HGB 15.2 09/11/2020   HCT 45.0 09/11/2020   MCV 85.9 09/11/2020   PLT 293 09/11/2020   No results found for: IRON, TIBC, FERRITIN  Attestation Statements:   Reviewed by clinician on day of visit: allergies, medications, problem list, medical history, surgical history, family history, social history, and previous encounter notes.   I, Trixie Dredge, am acting as transcriptionist for Dennard Nip, MD.  I have reviewed the above documentation for accuracy and completeness, and I agree with the above. -  Dennard Nip, MD

## 2021-04-03 ENCOUNTER — Other Ambulatory Visit (INDEPENDENT_AMBULATORY_CARE_PROVIDER_SITE_OTHER): Payer: Self-pay | Admitting: Family Medicine

## 2021-04-03 DIAGNOSIS — R7303 Prediabetes: Secondary | ICD-10-CM

## 2021-04-09 ENCOUNTER — Other Ambulatory Visit: Payer: Self-pay

## 2021-04-09 ENCOUNTER — Encounter (INDEPENDENT_AMBULATORY_CARE_PROVIDER_SITE_OTHER): Payer: Self-pay | Admitting: Family Medicine

## 2021-04-09 ENCOUNTER — Ambulatory Visit (INDEPENDENT_AMBULATORY_CARE_PROVIDER_SITE_OTHER): Payer: BC Managed Care – PPO | Admitting: Family Medicine

## 2021-04-09 VITALS — BP 116/74 | HR 88 | Temp 98.1°F | Ht 70.0 in | Wt 314.0 lb

## 2021-04-09 DIAGNOSIS — E559 Vitamin D deficiency, unspecified: Secondary | ICD-10-CM

## 2021-04-09 DIAGNOSIS — R7303 Prediabetes: Secondary | ICD-10-CM | POA: Diagnosis not present

## 2021-04-09 DIAGNOSIS — I10 Essential (primary) hypertension: Secondary | ICD-10-CM | POA: Diagnosis not present

## 2021-04-09 DIAGNOSIS — Z9189 Other specified personal risk factors, not elsewhere classified: Secondary | ICD-10-CM

## 2021-04-09 DIAGNOSIS — Z6841 Body Mass Index (BMI) 40.0 and over, adult: Secondary | ICD-10-CM

## 2021-04-09 MED ORDER — VITAMIN D (ERGOCALCIFEROL) 1.25 MG (50000 UNIT) PO CAPS: 50000.0000 [IU] | ORAL_CAPSULE | ORAL | 0 refills | Status: DC

## 2021-04-09 MED ORDER — SAXENDA 18 MG/3ML ~~LOC~~ SOPN
3.0000 mg | PEN_INJECTOR | Freq: Every day | SUBCUTANEOUS | 0 refills | Status: DC
Start: 2021-04-09 — End: 2021-04-29

## 2021-04-09 MED ORDER — METFORMIN HCL 500 MG PO TABS: ORAL_TABLET | ORAL | 0 refills | Status: DC

## 2021-04-09 MED ORDER — LOSARTAN POTASSIUM-HCTZ 50-12.5 MG PO TABS: 1.0000 | ORAL_TABLET | Freq: Every day | ORAL | 0 refills | Status: DC

## 2021-04-10 MED ORDER — BD PEN NEEDLE NANO U/F 32G X 4 MM MISC: 1.0000 | Freq: Every day | 0 refills | Status: DC

## 2021-04-10 NOTE — Progress Notes (Signed)
Chief Complaint:   OBESITY Bryan [name] is here to discuss his progress with his obesity treatment plan along with follow-up of his obesity related diagnoses. Bryan [name] is on the Category 3 Plan and states he is following his eating plan approximately 75% of the time. Bryan [name] states he is walking for 45 minutes 4-5 times per week.  Today's visit was #: 61 Starting weight: 326 lbs Starting date: 09/07/2017 Today's weight: 314 lbs Today's date: 04/09/2021 Total lbs lost to date: 12 Total lbs lost since last in-office visit: 3  Interim History: Bryan [name] continues to do well with weight loss. He is doing better with meal planning. His hunger is mostly controlled but sometimes he wakes up hungry.  Subjective:   1. Pre-diabetes Bryan [name] is stable on metformin and GLP-1, and he is doing better with diet and weight loss.  2. Essential hypertension Bryan [name]'s blood pressure is stable, and he is taking his Hyzaar every other day.  3. Vitamin D deficiency Bryan [name] is stable on Vit D, and he denies nausea or vomiting.  4. At risk for heart disease Bryan [name] is at a higher than average risk for cardiovascular disease due to obesity.   Assessment/Plan:   1. Pre-diabetes Bryan [name] will continue to work on weight loss, exercise, and decreasing simple carbohydrates to help decrease the risk of diabetes. We will refill metformin for 1 month.  - metFORMIN (GLUCOPHAGE) 500 MG tablet; TAKE 1 TABLET(500 MG) BY MOUTH THREE TIMES DAILY  Dispense: 90 tablet; Refill: 0  2. Essential hypertension Bryan [name] is working on healthy weight loss and exercise to improve blood pressure control. We will watch for signs of hypotension as he continues his lifestyle modifications. We will refill Hyzaar for 1 month. Bryan [name] is to check his blood pressure to see if there is a difference in his blood pressure on "skip days".  - losartan-hydrochlorothiazide (HYZAAR) 50-12.5 MG tablet; Take 1 tablet by mouth daily.  Dispense: 30  tablet; Refill: 0  3. Vitamin D deficiency Low Vitamin D level contributes to fatigue and are associated with obesity, breast, and colon cancer. We will refill prescription Vitamin D for 1 month. Bryan [name] will follow-up for routine testing of Vitamin D, at least 2-3 times per year to avoid over-replacement.  - Vitamin D, Ergocalciferol, (DRISDOL) 1.25 MG (50000 UNIT) CAPS capsule; Take 1 capsule (50,000 Units total) by mouth every 7 (seven) days.  Dispense: 4 capsule; Refill: 0  4. At risk for heart disease Bryan [name] was given approximately 15 minutes of coronary artery disease prevention counseling today. He is 60 y.o. male and has risk factors for heart disease including obesity. We discussed intensive lifestyle modifications today with an emphasis on specific weight loss instructions and strategies.   Repetitive spaced learning was employed today to elicit superior memory formation and behavioral change.  5. Obesity with current BMI 45.1 Bryan [name] is currently in the action stage of change. As such, his goal is to continue with weight loss efforts. He has agreed to the Category 3 Plan.   We discussed various medication options to help Bryan [name] with his weight loss efforts and we both agreed to continue Saxenda, and we will refill for 1 month, and we will refill pen needles #[phone] with no refills.  - Liraglutide -Weight Management (SAXENDA) 18 MG/3ML SOPN; Inject 3 mg into the skin daily.  Dispense: 15 mL; Refill: 0  Exercise goals: As is.  Behavioral modification strategies: increasing lean protein intake and meal planning and cooking strategies.  Bryan [name] has agreed  to follow-up with our clinic in 3 to 4 weeks. He was informed of the importance of frequent follow-up visits to maximize his success with intensive lifestyle modifications for his multiple health conditions.    Objective:   Blood pressure 116/74, pulse 88, temperature 98.1 F (36.7 C), height 5\' 10"  (1.778 m), weight (!) 314 lb  (142.4 kg), SpO2 97 %. Body mass index is 45.05 kg/m.  General: Cooperative, alert, well developed, in no acute distress. HEENT: Conjunctivae and lids unremarkable. Cardiovascular: Regular rhythm.  Lungs: Normal work of breathing. Neurologic: No focal deficits.   Lab Results  Component Value Date   CREATININE 0.78 03/05/2021   BUN 12 03/05/2021   NA 142 03/05/2021   K 4.2 03/05/2021   CL 101 03/05/2021   CO2 22 03/05/2021   Lab Results  Component Value Date   ALT 30 03/05/2021   AST 19 03/05/2021   ALKPHOS 58 03/05/2021   BILITOT 0.2 03/05/2021   Lab Results  Component Value Date   HGBA1C 6.2 (H) 02/10/2021   HGBA1C 6.3 (H) 09/11/2020   HGBA1C 6.3 (H) 05/29/2020   HGBA1C 6.4 (H) 01/10/2020   HGBA1C 6.2 07/07/2019   Lab Results  Component Value Date   INSULIN 22.6 02/10/2021   INSULIN 20.9 05/29/2020   INSULIN 24.0 01/10/2020   INSULIN 21.9 11/17/2018   INSULIN 24.0 08/18/2018   Lab Results  Component Value Date   TSH 1.560 01/10/2020   Lab Results  Component Value Date   CHOL 157 02/10/2021   HDL 41 02/10/2021   LDLCALC 79 02/10/2021   LDLDIRECT 136.0 11/16/2016   TRIG 222 (H) 02/10/2021   CHOLHDL 4 07/07/2019   Lab Results  Component Value Date   WBC 5.9 09/11/2020   HGB 15.2 09/11/2020   HCT 45.0 09/11/2020   MCV 85.9 09/11/2020   PLT 293 09/11/2020   No results found for: IRON, TIBC, FERRITIN  Attestation Statements:   Reviewed by clinician on day of visit: allergies, medications, problem list, medical history, surgical history, family history, social history, and previous encounter notes.   I, Trixie Dredge, am acting as transcriptionist for Dennard Nip, MD.  I have reviewed the above documentation for accuracy and completeness, and I agree with the above. -  Dennard Nip, MD

## 2021-04-18 ENCOUNTER — Other Ambulatory Visit: Payer: Self-pay

## 2021-04-18 ENCOUNTER — Ambulatory Visit (INDEPENDENT_AMBULATORY_CARE_PROVIDER_SITE_OTHER): Payer: BC Managed Care – PPO | Admitting: Pulmonary Disease

## 2021-04-18 ENCOUNTER — Ambulatory Visit: Payer: BC Managed Care – PPO | Admitting: Psychologist

## 2021-04-18 ENCOUNTER — Encounter: Payer: Self-pay | Admitting: Pulmonary Disease

## 2021-04-18 VITALS — BP 138/82 | HR 79 | Temp 98.1°F | Ht 70.0 in | Wt 324.6 lb

## 2021-04-18 DIAGNOSIS — G4730 Sleep apnea, unspecified: Secondary | ICD-10-CM

## 2021-04-18 DIAGNOSIS — J301 Allergic rhinitis due to pollen: Secondary | ICD-10-CM | POA: Diagnosis not present

## 2021-04-18 DIAGNOSIS — E669 Obesity, unspecified: Secondary | ICD-10-CM | POA: Diagnosis not present

## 2021-04-18 DIAGNOSIS — G4733 Obstructive sleep apnea (adult) (pediatric): Secondary | ICD-10-CM | POA: Diagnosis not present

## 2021-04-18 NOTE — Patient Instructions (Signed)
Will arrange for new CPAP machine ° °Follow up in 5 months °

## 2021-04-18 NOTE — Progress Notes (Signed)
[facility]  Chief Complaint  Patient presents with  . Establish Care    Re-establish care for OSA. States he has been using his cpap. Is interested in getting a new machine. Wants to discuss pressure change.     Constitutional:  BP 138/82   Pulse 79   Temp 98.1 F (36.7 C) (Temporal)   Ht 5\' 10"  (1.778 m)   Wt (!) 324 lb 9.6 oz (147.2 kg)   SpO2 97% Comment: on RA  BMI 46.58 kg/m   Past Medical History:  HLD, Allergies, Pre-DM, Nephrolithiasis  Past Surgical History:  He  has a past surgical history that includes Cholecystectomy (01/12/2012); Adenoidectomy; Ear Cyst Excision (N/A, 05/04/2013); and Cyst excision.  Brief Summary:  Bryan [name] is a 60 y.o. male with obstructive sleep apnea.      Subjective:   I last saw him in 2018.    He uses CPAP nightly.  Has nasal pillows mask.  Was getting supplies from [place].  He feels like his machine isn't generating pressure like before.  He snores, has sleep disruption, and daytime sleepiness when he can't use his CPAP.  Physical Exam:   Appearance - well kempt   ENMT - no sinus tenderness, no oral exudate, no LAN, Mallampati 3 airway, no stridor  Respiratory - equal breath sounds bilaterally, no wheezing or rales  CV - s1s2 regular rate and rhythm, no murmurs  Ext - no clubbing, no edema  Skin - no rashes  Psych - normal mood and affect   Sleep Tests:   CPAP 02/04/21 to 03/05/21 >> used on 30 of 30 nights with average 7 hrs 34 min.  Average AHI 1.9 with CPAP 10 cm H2O  Cardiac Tests:   Echo 09/21/17 >> EF 65 to 70%, grade 2 DD, mod RA dilation  Social History:  He  reports that he has never smoked. He has never used smokeless tobacco. He reports current alcohol use. He reports that he does not use drugs.  Family History:  His family history includes Cancer in his brother, mother, and paternal grandmother; Cancer (age of onset: 42) in his father; Colon cancer (age of  onset: 62) in his maternal aunt; Diabetes in his brother; Hyperlipidemia in his mother; Hypertension in his mother; Kidney disease in his father; Obesity in his mother; Sleep apnea in his father.     Assessment/Plan:   Obstructive sleep apnea. - he is compliant with CPAP and reports benefit from therapy - he uses [facility] for his DME - his current device is more than 58 yrs old, not functioning properly - will arrange for new CPAP machine at 10 cm H2O - explained that his insurance might require a new home sleep study prior to getting a new CPAP machine - discussed issues with backlog on CPAP machine; he would prefer to wait for a Resmed device  Seasonal allergic rhinitis. - prn OTC antihistamines  Obesity. - he is aware of how his weight can impact his health, particularly with regard to sleep apnea   Time Spent Involved in Patient Care on Day of Examination:  32 minutes  Follow up:  Patient Instructions  Will arrange for new CPAP machine  Follow up in 5 months   Medication List:   Allergies as of 04/18/2021      Reactions   Lisinopril Cough      Medication List       Accurate as of Apr 18, 2021  9:36 AM. If you have any questions, ask your nurse or doctor.        aspirin 81 MG tablet Take 81 mg by mouth daily.   atorvastatin 20 MG tablet Commonly known as: LIPITOR Take 1 tablet (20 mg total) by mouth at bedtime.   azelastine 0.1 % nasal spray Commonly known as: ASTELIN Place 2 sprays into both nostrils at bedtime as needed for rhinitis. Use in each nostril as directed   BD Pen Needle Nano U/F 32G X 4 MM Misc Generic drug: Insulin Pen Needle 1 each by Does not apply route daily. Use one pen needle once daily to inject Saxenda.   buPROPion 150 MG 12 hr tablet Commonly known as: Wellbutrin SR Take 1 tablet (150 mg total) by mouth in the morning.   CENTRUM PO Take 1 tablet by mouth daily.   CO ENZYME Q-10 PO Take by mouth daily.   fish oil-omega-3 fatty  acids 1000 MG capsule Take 1 g by mouth daily.   losartan-hydrochlorothiazide 50-12.5 MG tablet Commonly known as: HYZAAR Take 1 tablet by mouth daily.   Melatonin 1 MG Caps Take 1 mg by mouth at bedtime as needed.   metFORMIN 500 MG tablet Commonly known as: GLUCOPHAGE TAKE 1 TABLET(500 MG) BY MOUTH THREE TIMES DAILY   Saxenda 18 MG/3ML Sopn Generic drug: Liraglutide -Weight Management Inject 3 mg into the skin daily.   Vitamin D (Ergocalciferol) 1.25 MG (50000 UNIT) Caps capsule Commonly known as: DRISDOL Take 1 capsule (50,000 Units total) by mouth every 7 (seven) days.       Signature:  Chesley Mires, MD [place] Pager - [phone] 04/18/2021, 9:36 AM

## 2021-04-29 ENCOUNTER — Other Ambulatory Visit: Payer: Self-pay

## 2021-04-29 ENCOUNTER — Encounter (INDEPENDENT_AMBULATORY_CARE_PROVIDER_SITE_OTHER): Payer: Self-pay | Admitting: Family Medicine

## 2021-04-29 ENCOUNTER — Ambulatory Visit (INDEPENDENT_AMBULATORY_CARE_PROVIDER_SITE_OTHER): Payer: BC Managed Care – PPO | Admitting: Family Medicine

## 2021-04-29 VITALS — BP 131/79 | HR 77 | Temp 97.9°F | Ht 70.0 in | Wt 325.0 lb

## 2021-04-29 DIAGNOSIS — Z6841 Body Mass Index (BMI) 40.0 and over, adult: Secondary | ICD-10-CM

## 2021-04-29 DIAGNOSIS — I10 Essential (primary) hypertension: Secondary | ICD-10-CM | POA: Diagnosis not present

## 2021-04-29 DIAGNOSIS — E559 Vitamin D deficiency, unspecified: Secondary | ICD-10-CM

## 2021-04-29 DIAGNOSIS — Z9189 Other specified personal risk factors, not elsewhere classified: Secondary | ICD-10-CM

## 2021-04-29 MED ORDER — SAXENDA 18 MG/3ML ~~LOC~~ SOPN
3.0000 mg | PEN_INJECTOR | Freq: Every day | SUBCUTANEOUS | 0 refills | Status: DC
Start: 2021-04-29 — End: 2021-06-10

## 2021-04-29 MED ORDER — VITAMIN D (ERGOCALCIFEROL) 1.25 MG (50000 UNIT) PO CAPS: 50000.0000 [IU] | ORAL_CAPSULE | ORAL | 0 refills | Status: DC

## 2021-04-29 MED ORDER — HYDROCHLOROTHIAZIDE 25 MG PO TABS: 25.0000 mg | ORAL_TABLET | Freq: Every day | ORAL | 0 refills | Status: DC

## 2021-04-29 MED ORDER — LOSARTAN POTASSIUM 50 MG PO TABS: 50.0000 mg | ORAL_TABLET | Freq: Every day | ORAL | 0 refills | Status: DC

## 2021-04-30 NOTE — Progress Notes (Signed)
Chief Complaint:   OBESITY Bryan [name] is here to discuss his progress with his obesity treatment plan along with follow-up of his obesity related diagnoses. Bryan [name] is on the Category 3 Plan and states he is following his eating plan approximately 50% of the time. Bryan [name] states he is walking for 45 minutes 3 times per week.  Today's visit was #: 58 Starting weight: 326 lbs Starting date: 09/07/2017 Today's weight: 325 lbs Today's date: 04/29/2021 Total lbs lost to date: 1 Total lbs lost since last in-office visit: 0  Interim History: Bryan [name] is retaining some water weight after doing a lot of traveling and not taking his hydrochlorothiazide as regularly. He is going on vacation soon and he is open to discussing vacation eating strategies.  Subjective:   1. Essential hypertension Bryan [name] blood pressure is stable on his medications, but he is retaining fluid today and he needs a higher dose of hydrochlorothiazide.  2. Vitamin D deficiency Bryan [name]'s Vit D level is stable, and he denies nausea or vomiting. His Vit D level is not yet at goal.  3. At risk for fluid volume overload Bryan [name] is at a higher than average risk for fluid retention due to obesity. Reviewed: no chest pain on exertion, no dyspnea at rest, and no swelling of ankles.  Assessment/Plan:   1. Essential hypertension Bryan [name] agreed to discontinue losartan-hydrochlorothiazide. Bryan [name] agreed to start losartan 50 mg q daily with no refills, and start hydrochlorothiazide 25 mg q daily with no refills. He will continue working on healthy weight loss and exercise to improve blood pressure control. We will watch for signs of hypotension as he continues his lifestyle modifications.  - losartan (COZAAR) 50 MG tablet; Take 1 tablet (50 mg total) by mouth daily.  Dispense: 30 tablet; Refill: 0 - hydrochlorothiazide (HYDRODIURIL) 25 MG tablet; Take 1 tablet (25 mg total) by mouth daily.  Dispense: 30 tablet; Refill: 0  2.  Vitamin D deficiency Low Vitamin D level contributes to fatigue and are associated with obesity, breast, and colon cancer. We will refill prescription Vitamin D for 1 month. Bryan [name] will follow-up for routine testing of Vitamin D, at least 2-3 times per year to avoid over-replacement.  - Vitamin D, Ergocalciferol, (DRISDOL) 1.25 MG (50000 UNIT) CAPS capsule; Take 1 capsule (50,000 Units total) by mouth every 7 (seven) days.  Dispense: 4 capsule; Refill: 0  3. At risk for fluid volume overload Bryan [name] was given approximately 15 minutes of fluid retention prevention counseling today. He is 60 y.o. male and has risk factors for fluid retention including obesity. We discussed intensive lifestyle modifications today with an emphasis on specific weight loss instructions, proper nutrition and exercise strategies.   Repetitive spaced learning was employed today to elicit superior memory formation and behavioral change.  4. Obesity with current BMI 46.7 Bryan [name] is currently in the action stage of change. As such, his goal is to continue with weight loss efforts. He has agreed to the Category 3 Plan.   We discussed various medication options to help Bryan [name] with his weight loss efforts and we both agreed to continue taking Saxenda at 3.0 mg and we will refill for 1 month.  - Liraglutide -Weight Management (SAXENDA) 18 MG/3ML SOPN; Inject 3 mg into the skin daily.  Dispense: 15 mL; Refill: 0  Exercise goals: As is.  Behavioral modification strategies: increasing water intake, decreasing sodium intake, travel eating strategies and celebration eating strategies.  Bryan [name] has agreed to follow-up with our clinic in 3 weeks.  He was informed of the importance of frequent follow-up visits to maximize his success with intensive lifestyle modifications for his multiple health conditions.   Objective:   Blood pressure 131/79, pulse 77, temperature 97.9 F (36.6 C), height 5\' 10"  (1.778 m), weight (!) 325 lb  (147.4 kg), SpO2 97 %. Body mass index is 46.63 kg/m.  General: Cooperative, alert, well developed, in no acute distress. HEENT: Conjunctivae and lids unremarkable. Cardiovascular: Regular rhythm.  Lungs: Normal work of breathing. Neurologic: No focal deficits.   Lab Results  Component Value Date   CREATININE 0.78 03/05/2021   BUN 12 03/05/2021   NA 142 03/05/2021   K 4.2 03/05/2021   CL 101 03/05/2021   CO2 22 03/05/2021   Lab Results  Component Value Date   ALT 30 03/05/2021   AST 19 03/05/2021   ALKPHOS 58 03/05/2021   BILITOT 0.2 03/05/2021   Lab Results  Component Value Date   HGBA1C 6.2 (H) 02/10/2021   HGBA1C 6.3 (H) 09/11/2020   HGBA1C 6.3 (H) 05/29/2020   HGBA1C 6.4 (H) 01/10/2020   HGBA1C 6.2 07/07/2019   Lab Results  Component Value Date   INSULIN 22.6 02/10/2021   INSULIN 20.9 05/29/2020   INSULIN 24.0 01/10/2020   INSULIN 21.9 11/17/2018   INSULIN 24.0 08/18/2018   Lab Results  Component Value Date   TSH 1.560 01/10/2020   Lab Results  Component Value Date   CHOL 157 02/10/2021   HDL 41 02/10/2021   LDLCALC 79 02/10/2021   LDLDIRECT 136.0 11/16/2016   TRIG 222 (H) 02/10/2021   CHOLHDL 4 07/07/2019   Lab Results  Component Value Date   WBC 5.9 09/11/2020   HGB 15.2 09/11/2020   HCT 45.0 09/11/2020   MCV 85.9 09/11/2020   PLT 293 09/11/2020   No results found for: IRON, TIBC, FERRITIN  Attestation Statements:   Reviewed by clinician on day of visit: allergies, medications, problem list, medical history, surgical history, family history, social history, and previous encounter notes.   I, Trixie Dredge, am acting as transcriptionist for Dennard Nip, MD.  I have reviewed the above documentation for accuracy and completeness, and I agree with the above. -  Dennard Nip, MD

## 2021-05-03 ENCOUNTER — Other Ambulatory Visit (INDEPENDENT_AMBULATORY_CARE_PROVIDER_SITE_OTHER): Payer: Self-pay | Admitting: Family Medicine

## 2021-05-03 DIAGNOSIS — R7303 Prediabetes: Secondary | ICD-10-CM

## 2021-05-07 NOTE — Telephone Encounter (Signed)
Ok x 90 days x 1

## 2021-05-07 NOTE — Telephone Encounter (Signed)
Patient is requesting a refill of the following medications: Requested Prescriptions   Pending Prescriptions Disp Refills   metFORMIN (GLUCOPHAGE) 500 MG tablet [Pharmacy Med Name: METFORMIN 500MG  TABLETS] 270 tablet     Sig: TAKE 1 TABLET(500 MG) BY MOUTH THREE TIMES DAILY     Last office visit: 04/29/21 Date of last refill: 04/09/21 Last refill amount: 90 for 30 day supply Follow up time period per chart: 3 week Next scheduled appt: 05/21/21

## 2021-05-15 ENCOUNTER — Other Ambulatory Visit: Payer: Self-pay

## 2021-05-15 ENCOUNTER — Ambulatory Visit (AMBULATORY_SURGERY_CENTER): Payer: BC Managed Care – PPO | Admitting: *Deleted

## 2021-05-15 VITALS — Ht 71.0 in | Wt 323.0 lb

## 2021-05-15 DIAGNOSIS — Z86010 Personal history of colonic polyps: Secondary | ICD-10-CM

## 2021-05-15 MED ORDER — SUTAB 1479-225-188 MG PO TABS: 24.0000 | ORAL_TABLET | ORAL | 0 refills | Status: DC

## 2021-05-15 NOTE — Progress Notes (Signed)
No egg or soy allergy known to patient  No issues with past sedation with any surgeries or procedures Patient denies ever being told they had issues or difficulty with intubation  No FH of Malignant Hyperthermia No diet pills per patient No home 02 use per patient  No blood thinners per patient  Pt denies issues with constipation  No A fib or A flutter  EMMI video to pt or via [place] [facility] guidelines implemented in [name] today with Pt and RN  Pt is fully vaccinated  for [employer] given to pt in PV today , Code to Pharmacy and  NO PA's for preps discussed with pt In PV today  Discussed with pt there will be an out-of-pocket cost for prep and that varies from $0 to 70 dollars   Due to the COVID-19 pandemic we are asking patients to follow certain guidelines.  Pt aware of COVID protocols and [facility] guidelines   Pt verified name, DOB, address and insurance during PV today. Pt mailed instruction packet to included paper to complete and mail back to [facility] with addressed and stamped envelope, Emmi video, copy of consent form to read and not return, and instructions. [employer]  coupon mailed in packet. PV completed over the phone. Pt encouraged to call with questions or issues. My Chart instructions to pt as well

## 2021-05-19 ENCOUNTER — Other Ambulatory Visit (INDEPENDENT_AMBULATORY_CARE_PROVIDER_SITE_OTHER): Payer: Self-pay | Admitting: Family Medicine

## 2021-05-19 DIAGNOSIS — I10 Essential (primary) hypertension: Secondary | ICD-10-CM

## 2021-05-19 NOTE — Telephone Encounter (Signed)
Dr.Beasley 

## 2021-05-20 NOTE — Telephone Encounter (Signed)
Will refill at office visit schedule 05/21/21

## 2021-05-21 ENCOUNTER — Encounter (INDEPENDENT_AMBULATORY_CARE_PROVIDER_SITE_OTHER): Payer: Self-pay | Admitting: Family Medicine

## 2021-05-21 ENCOUNTER — Ambulatory Visit (INDEPENDENT_AMBULATORY_CARE_PROVIDER_SITE_OTHER): Payer: BC Managed Care – PPO | Admitting: Family Medicine

## 2021-05-21 ENCOUNTER — Other Ambulatory Visit: Payer: Self-pay

## 2021-05-21 VITALS — BP 132/69 | HR 71 | Temp 98.0°F | Ht 71.0 in | Wt 319.0 lb

## 2021-05-21 DIAGNOSIS — F3289 Other specified depressive episodes: Secondary | ICD-10-CM

## 2021-05-21 DIAGNOSIS — I10 Essential (primary) hypertension: Secondary | ICD-10-CM

## 2021-05-21 DIAGNOSIS — E559 Vitamin D deficiency, unspecified: Secondary | ICD-10-CM

## 2021-05-21 DIAGNOSIS — Z6841 Body Mass Index (BMI) 40.0 and over, adult: Secondary | ICD-10-CM

## 2021-05-21 DIAGNOSIS — Z9189 Other specified personal risk factors, not elsewhere classified: Secondary | ICD-10-CM | POA: Diagnosis not present

## 2021-05-21 MED ORDER — BUPROPION HCL ER (SR) 150 MG PO TB12: 150.0000 mg | ORAL_TABLET | Freq: Every morning | ORAL | 0 refills | Status: DC

## 2021-05-21 MED ORDER — HYDROCHLOROTHIAZIDE 25 MG PO TABS: 25.0000 mg | ORAL_TABLET | Freq: Every day | ORAL | 0 refills | Status: DC

## 2021-05-21 MED ORDER — VITAMIN D (ERGOCALCIFEROL) 1.25 MG (50000 UNIT) PO CAPS: 50000.0000 [IU] | ORAL_CAPSULE | ORAL | 0 refills | Status: DC

## 2021-05-21 MED ORDER — LOSARTAN POTASSIUM 50 MG PO TABS: 50.0000 mg | ORAL_TABLET | Freq: Every day | ORAL | 0 refills | Status: DC

## 2021-05-25 ENCOUNTER — Emergency Department (HOSPITAL_BASED_OUTPATIENT_CLINIC_OR_DEPARTMENT_OTHER): Payer: BC Managed Care – PPO

## 2021-05-25 ENCOUNTER — Encounter (HOSPITAL_BASED_OUTPATIENT_CLINIC_OR_DEPARTMENT_OTHER): Payer: Self-pay

## 2021-05-25 ENCOUNTER — Telehealth: Payer: Self-pay | Admitting: Student in an Organized Health Care Education/Training Program

## 2021-05-25 ENCOUNTER — Telehealth: Payer: BC Managed Care – PPO | Admitting: Family

## 2021-05-25 ENCOUNTER — Encounter: Payer: Self-pay | Admitting: Family

## 2021-05-25 ENCOUNTER — Emergency Department (HOSPITAL_BASED_OUTPATIENT_CLINIC_OR_DEPARTMENT_OTHER)
Admission: EM | Admit: 2021-05-25 | Discharge: 2021-05-25 | Disposition: A | Discharge status: Home / Self Care | Payer: BC Managed Care – PPO | Attending: Emergency Medicine | Admitting: Emergency Medicine

## 2021-05-25 ENCOUNTER — Other Ambulatory Visit: Payer: Self-pay

## 2021-05-25 DIAGNOSIS — I483 Typical atrial flutter: Secondary | ICD-10-CM | POA: Insufficient documentation

## 2021-05-25 DIAGNOSIS — I4891 Unspecified atrial fibrillation: Secondary | ICD-10-CM | POA: Insufficient documentation

## 2021-05-25 DIAGNOSIS — Z7982 Long term (current) use of aspirin: Secondary | ICD-10-CM | POA: Diagnosis not present

## 2021-05-25 DIAGNOSIS — R000 Tachycardia, unspecified: Secondary | ICD-10-CM

## 2021-05-25 DIAGNOSIS — E1169 Type 2 diabetes mellitus with other specified complication: Secondary | ICD-10-CM | POA: Diagnosis not present

## 2021-05-25 DIAGNOSIS — R5383 Other fatigue: Secondary | ICD-10-CM | POA: Diagnosis present

## 2021-05-25 DIAGNOSIS — I10 Essential (primary) hypertension: Secondary | ICD-10-CM | POA: Diagnosis not present

## 2021-05-25 DIAGNOSIS — Z79899 Other long term (current) drug therapy: Secondary | ICD-10-CM | POA: Diagnosis not present

## 2021-05-25 DIAGNOSIS — Z7984 Long term (current) use of oral hypoglycemic drugs: Secondary | ICD-10-CM | POA: Insufficient documentation

## 2021-05-25 DIAGNOSIS — Z7901 Long term (current) use of anticoagulants: Secondary | ICD-10-CM | POA: Insufficient documentation

## 2021-05-25 DIAGNOSIS — E785 Hyperlipidemia, unspecified: Secondary | ICD-10-CM | POA: Insufficient documentation

## 2021-05-25 DIAGNOSIS — Z794 Long term (current) use of insulin: Secondary | ICD-10-CM | POA: Diagnosis not present

## 2021-05-25 DIAGNOSIS — I4892 Unspecified atrial flutter: Secondary | ICD-10-CM

## 2021-05-25 LAB — BASIC METABOLIC PANEL
Anion gap: 9 (ref 5–15)
BUN: 20 mg/dL (ref 6–20)
CO2: 25 mmol/L (ref 22–32)
Calcium: 9 mg/dL (ref 8.9–10.3)
Chloride: 102 mmol/L (ref 98–111)
Creatinine, Ser: 0.99 mg/dL (ref 0.61–1.24)
GFR, Estimated: >60 mL/min (ref >60)
Glucose, Bld: 136 mg/dL — ABNORMAL HIGH (ref 70–99)
Potassium: 3.2 mmol/L — ABNORMAL LOW (ref 3.5–5.1)
Sodium: 136 mmol/L (ref 135–145)

## 2021-05-25 LAB — CBC
HCT: 44.5 % (ref 39.0–52.0)
Hemoglobin: 15.2 g/dL (ref 13.0–17.0)
MCH: 28.8 pg (ref 26.0–34.0)
MCHC: 34.2 g/dL (ref 30.0–36.0)
MCV: 84.4 fL (ref 80.0–100.0)
Platelets: 325 10*3/uL (ref 150–400)
RBC: 5.27 MIL/uL (ref 4.22–5.81)
RDW: 13.5 % (ref 11.5–15.5)
WBC: 10.4 10*3/uL (ref 4.0–10.5)
nRBC: 0 % (ref 0.0–0.2)

## 2021-05-25 LAB — TSH: TSH: 1.6 u[IU]/mL (ref 0.350–4.500)

## 2021-05-25 LAB — MAGNESIUM: Magnesium: 2.2 mg/dL (ref 1.7–2.4)

## 2021-05-25 MED ORDER — DILTIAZEM HCL 30 MG PO TABS: 30.0000 mg | ORAL_TABLET | Freq: Once | ORAL | 0 refills | Status: DC | PRN

## 2021-05-25 MED ORDER — APIXABAN 2.5 MG PO TABS
10.0000 mg | ORAL_TABLET | ORAL | Status: AC
  Administered 2021-05-25: 10 mg via ORAL
  Filled 2021-05-25: qty 4

## 2021-05-25 MED ORDER — APIXABAN 5 MG PO TABS
5.0000 mg | ORAL_TABLET | Freq: Two times a day (BID) | ORAL | 0 refills | Status: DC
  Filled 2021-05-26: qty 60, 30d supply, fill #0

## 2021-05-25 MED ORDER — ADENOSINE 6 MG/2ML IV SOLN
6.0000 mg | Freq: Once | INTRAVENOUS | Status: AC
  Administered 2021-05-25: 6 mg via INTRAVENOUS
  Filled 2021-05-25: qty 2

## 2021-05-25 MED ORDER — ETOMIDATE 2 MG/ML IV SOLN
10.0000 mg | Freq: Once | INTRAVENOUS | Status: AC
  Administered 2021-05-25: 10 mg via INTRAVENOUS
  Filled 2021-05-25: qty 10

## 2021-05-25 MED ORDER — DILTIAZEM HCL 30 MG PO TABS
30.0000 mg | ORAL_TABLET | Freq: Once | ORAL | 0 refills | Status: AC | PRN
  Filled 2021-05-26: qty 30, 15d supply, fill #0

## 2021-05-25 MED ORDER — APIXABAN 2.5 MG PO TABS: 10.0000 mg | ORAL_TABLET | ORAL | Status: DC

## 2021-05-25 NOTE — Progress Notes (Signed)
Reviewed telemetry strips following adenosine, confirmed AFL. Successful DCCV 100 J x1, will f/u with AF clinic. Needs to get eliquis dose before leaving tonight and have prescription sent in where he can start taking in AM.

## 2021-05-25 NOTE — Discharge Instructions (Addendum)
You are diagnosed today with a heart rhythm/arrhythmia called atrial flutter.  This can cause your heart rate to go quite rapidly.  Please stop taking aspirin and start taking the blood thinner Eliquis that I have prescribed you.  You will take 5 mg twice daily once in the morning once in the evening.     I have also prescribed you 30 tablets of medication called diltiazem.  It is a 30 mg tablet you will take 1 tablet if you start having a racing heart rate again that is over 130 bpm at rest and if your symptoms do not resolve you will come back to the ER for reevaluation.  You may take as many as 2 tablets--a total of 60 mg--within a 6-hour period.  Ultimately if you have any uncertainty, new or concerning symptoms you may always return to the ER for reevaluation.

## 2021-05-25 NOTE — Sedation Documentation (Signed)
Syncronized shock delivered at [facility]

## 2021-05-25 NOTE — ED Triage Notes (Signed)
Pt was alerted by his Apple Watch that his HR was 135 while inactive. Pt reports some ShOB with exertion and fatigue.

## 2021-05-25 NOTE — ED Notes (Signed)
ED Provider at bedside. 

## 2021-05-25 NOTE — ED Provider Notes (Signed)
I provided a substantive portion of the care of this patient.  I personally performed the entirety of the history, exam, and medical decision making for this encounter.   Seen by me along with physician assistant.  Patient here for rapid heart rate.  EKG suggestive initially of atrial flutter.  Then some question of atrial fibrillation.  Heart rate pretty steady at around 145.  Patient has an apple watch and it indicated that his heart rate got fast about 3 in the morning.  None prior to that.  So he is less than 48 hours.  Patient takes a baby aspirin a day.  No prior history of atrial tachycardias.  She did have an echocardiogram done in 2018 without any significant findings  Labs and chest x-ray normal here the potassium being slightly low at 3.2.  We spoke to Dr. Renella Cunas from cardiology he recommended go ahead and trying adenosine to see what the underlying rhythm was.  We did give him 6 mg of adenosine and that showed flutter type waves.  He agreed.  Patient went back into rapid heart rate.  So we prepped him for cardioversion synchronized 100 J.  Patient received conscious sedation with etomidate.  Which I performed.  See procedure note below.  Patient then was shocked 1 time with 100 J synchronized and he went into a normal sinus rhythm heart rate around 78 with a right bundle branch block that he had prior.  .Sedation  Date/Time: 05/25/2021 8:00 PM Performed by: Fredia Sorrow, MD Authorized by: Fredia Sorrow, MD   Consent:    Consent obtained:  Written   Consent given by:  Patient   Risks discussed:  Allergic reaction, prolonged sedation necessitating reversal, prolonged hypoxia resulting in organ damage, inadequate sedation, respiratory compromise necessitating ventilatory assistance and intubation, nausea and vomiting   Alternatives discussed:  Analgesia without sedation Universal protocol:    Procedure explained and questions answered to patient or proxy's satisfaction: yes      Relevant documents present and verified: yes     Test results available: yes     Imaging studies available: yes     Required blood products, implants, devices, and special equipment available: yes     Site/side marked: no     Immediately prior to procedure, a time out was called: yes   Indications:    Procedure performed:  Cardioversion Pre-sedation assessment:    Time since last food or drink:  2 hours   NPO status caution: urgency dictates proceeding with non-ideal NPO status     ASA classification: class 2 - patient with mild systemic disease     Mallampati score:  I - soft palate, uvula, fauces, pillars visible   Neck mobility: normal     Pre-sedation assessments completed and reviewed: airway patency     Pre-sedation assessments completed and reviewed: pre-procedure nausea and vomiting status not reviewed and pre-procedure respiratory function not reviewed   Immediate pre-procedure details:    Reviewed: vital signs     Verified: bag valve mask available, emergency equipment available, intubation equipment available, IV patency confirmed, oxygen available and suction available   Procedure details (see MAR for exact dosages):    Preoxygenation:  Nasal cannula   Sedation:  Etomidate   Intended level of sedation: deep   Intra-procedure events: none     Total Provider sedation time (minutes):  20 Post-procedure details:    Post-sedation assessment completed:  05/25/2021 8:03 PM   Post-sedation assessments completed and reviewed: post-procedure nausea and  vomiting status not reviewed, pain score not reviewed and post-procedure respiratory function not reviewed     Patient is stable for discharge or admission: yes     Procedure completion:  Tolerated well, no immediate complications     EKG Interpretation  Date/Time:  Sunday May 25 2021 17:28:23 EDT Ventricular Rate:  146 PR Interval:  126 QRS Duration: 132 QT Interval:  302 QTC Calculation: 470 R Axis:   -24 Text  Interpretation: Sinus tachycardia with occasional Premature ventricular complexes Right bundle branch block T wave abnormality, consider inferolateral ischemia Abnormal ECG Confirmed by Fredia Sorrow [phone]) on 05/25/2021 5:49:14 PM    Fredia Sorrow, MD 05/25/21 2003

## 2021-05-25 NOTE — ED Provider Notes (Signed)
[facility] EMERGENCY DEPARTMENT Provider Note   CSN: [id number] Arrival date & time: 05/25/21  1719     History No chief complaint on file.   Bryan [name] is a 60 y.o. male.  HPI Patient is a 60 year old male presented today with medical history detailed below history of normal echocardiogram July 2018, history of some alcohol use had 6 alcoholic drinks yesterday.  History of DM, HLD, is not a smoker denies any recreational drug use denies any history of thyroid issues  Patient states that at 3 AM this morning his apple watch told him that his heart rate was greater than 130.  He states that when he got out of bed he did notice some mild fatigue.  No dyspnea lightheadedness dizziness or chest pain.  He states that he has been feeling somewhat off the whole day.  Give the ER for evaluation.  Denies any leg swelling no chest pain shortness of breath no fevers or chills.  No significant other associated symptoms.  Symptoms have been continuous.  Without any pain.  No aggravating mitigating factors.  No other significant associated symptoms apartment as discussed above.  He has taken no medications prior to arrival.     Past Medical History:  Diagnosis Date   Allergy    Dental crowns present    Hyperlipidemia    Lower back pain    Nephrolithiasis 2017   Prediabetes 09/04/2014   Seasonal allergies    Sebaceous cyst 04/2013   upper back   Sleep apnea    uses CPAP nightly   Urolithiasis 10/2016    Patient Active Problem List   Diagnosis Date Noted   Prediabetes 10/20/2018   Left chronic serous otitis media 07/26/2018   Essential hypertension 01/05/2018   Vitamin D deficiency 01/05/2018   Other fatigue 09/08/2017   Right bundle branch block 09/08/2017   Class 3 severe obesity with serious comorbidity and body mass index (BMI) of 45.0 to 49.9 in adult [facility]) 09/08/2017   Urolithiasis    PCP NOTES >>>>>>>>>>>>>>>>>>>>>>>> 05/23/2016   Hyperglycemia 41/96/2229    Chronic folliculitis 79/89/2119   Allergic rhinitis 02/27/2013   Insomnia 02/27/2013   Annual physical exam 02/26/2012   Hyperlipidemia 06/05/2008   Obstructive sleep apnea 06/05/2008    Past Surgical History:  Procedure Laterality Date   ADENOIDECTOMY     as a child   bone marrow donor     under anesthesia   CHOLECYSTECTOMY  01/12/2012   Procedure: LAPAROSCOPIC CHOLECYSTECTOMY WITH INTRAOPERATIVE CHOLANGIOGRAM;  Surgeon: [name], MD;  Location: [facility];  Service: General;  Laterality: N/A;   COLONOSCOPY     CYST EXCISION     cyst removal from back   EAR CYST EXCISION N/A 05/04/2013   Procedure: Excision subcutaneous mass posterior neck;  Surgeon: Imogene Burn. [name], MD;  Location: [place];  Service: General;  Laterality: N/A;   POLYPECTOMY     wisdom teeth         Family History  Problem Relation Age of Onset   Ovarian cancer Mother    Cancer Mother        ovarian   Hypertension Mother    Hyperlipidemia Mother    Obesity Mother    Cancer Father 66       renal   Kidney disease Father    Sleep apnea Father    Cancer Brother        leukemia   Diabetes Brother    Colon cancer  Maternal Aunt 75   Cancer Paternal Grandmother        breast   Prostate cancer Neg Hx    Esophageal cancer Neg Hx    Rectal cancer Neg Hx    Stomach cancer Neg Hx    CAD Neg Hx    Colon polyps Neg Hx     Social History   Tobacco Use   Smoking status: Never   Smokeless tobacco: Never  Vaping Use   Vaping Use: Never used  Substance Use Topics   Alcohol use: Yes    Comment: 1 glass wine every other day   Drug use: No    Home Medications Prior to Admission medications   Medication Sig Start Date End Date Taking? Authorizing Provider  apixaban (ELIQUIS) 5 MG TABS tablet Take 1 tablet (5 mg total) by mouth 2 (two) times daily. 05/25/21 06/24/21 Yes Pati Gallo S, PA  aspirin 81 MG tablet Take 81 mg by mouth daily.    [provider]  atorvastatin  (LIPITOR) 20 MG tablet Take 1 tablet (20 mg total) by mouth at bedtime. 12/04/20   Colon Branch, MD  azelastine (ASTELIN) 0.1 % nasal spray Place 2 sprays into both nostrils at bedtime as needed for rhinitis. Use in each nostril as directed 03/14/19   Colon Branch, MD  buPROPion [facility] SR) 150 MG 12 hr tablet Take 1 tablet (150 mg total) by mouth in the morning. 05/21/21   Dennard Nip D, MD  CO ENZYME Q-10 PO Take by mouth daily.    [provider]  diltiazem (CARDIZEM) 30 MG tablet Take 1 tablet (30 mg total) by mouth once as needed for up to 1 dose (for rapid heart rate). Take 1-2 tablets every 6 hours AS NEEDED FOR HEART RATE GREATER THAN 140 05/25/21   Daphne Karrer, Palmer S, PA  fish oil-omega-3 fatty acids 1000 MG capsule Take 1 g by mouth daily.    [provider]  hydrochlorothiazide (HYDRODIURIL) 25 MG tablet Take 1 tablet (25 mg total) by mouth daily. 05/21/21   Dennard Nip D, MD  Insulin Pen Needle (BD PEN NEEDLE NANO U/F) 32G X 4 MM MISC 1 each by Does not apply route daily. Use one pen needle once daily to inject Saxenda. 04/10/21   Dennard Nip D, MD  Liraglutide -Weight Management (SAXENDA) 18 MG/3ML SOPN Inject 3 mg into the skin daily. 04/29/21   Dennard Nip D, MD  losartan (COZAAR) 50 MG tablet Take 1 tablet (50 mg total) by mouth daily. 05/21/21   Dennard Nip D, MD  Melatonin 1 MG CAPS Take 1 mg by mouth at bedtime as needed.     [provider]  metFORMIN (GLUCOPHAGE) 500 MG tablet TAKE 1 TABLET(500 MG) BY MOUTH THREE TIMES DAILY 05/08/21   Dennard Nip D, MD  Multiple Vitamins-Minerals (CENTRUM PO) Take 1 tablet by mouth daily.    [provider]  Sodium Sulfate-Mag Sulfate-KCl (SUTAB) [phone] MG TABS Take 24 tablets by mouth as directed. MANUFACTURER CODES!! BIN: [id number] PCN: CN GROUP: [id number] MEMBER ID: [id number];TOI AS SECONDARY INSURANCE or cash  ;NO PRIOR AUTHORIZATION 05/15/21   Irene Shipper, MD  Vitamin D, Ergocalciferol,  (DRISDOL) 1.25 MG (50000 UNIT) CAPS capsule Take 1 capsule (50,000 Units total) by mouth every 7 (seven) days. 05/21/21   Dennard Nip D, MD    Allergies    Lisinopril  Review of Systems   Review of Systems  Constitutional:  Negative for chills and fever.  HENT:  Negative for congestion.   Eyes:  Negative for pain.  Respiratory:  Positive for shortness of breath (mild, with exertion). Negative for cough.   Cardiovascular:  Negative for chest pain and leg swelling.  Gastrointestinal:  Negative for abdominal pain, diarrhea, nausea and vomiting.  Genitourinary:  Negative for dysuria.  Musculoskeletal:  Negative for myalgias.  Skin:  Negative for rash.  Neurological:  Negative for dizziness and headaches.   Physical Exam Updated Vital Signs BP 104/65   Pulse 82   Temp 98.1 F (36.7 C) (Oral)   Resp 14   Ht 5\' 11"  (1.803 m)   Wt (!) 146.1 kg   SpO2 100%   BMI 44.91 kg/m   Physical Exam Vitals and nursing note reviewed.  Constitutional:      General: He is not in acute distress. HENT:     Head: Normocephalic and atraumatic.     Nose: Nose normal.  Eyes:     General: No scleral icterus. Cardiovascular:     Rate and Rhythm: Regular rhythm. Tachycardia present.     Pulses: Normal pulses.     Heart sounds: Normal heart sounds.     Comments: Regular  Pulmonary:     Effort: Pulmonary effort is normal. No respiratory distress.     Breath sounds: No wheezing.  Abdominal:     Palpations: Abdomen is soft.     Tenderness: There is no abdominal tenderness. There is no guarding or rebound.  Musculoskeletal:     Cervical back: Normal range of motion.     Right lower leg: No edema.     Left lower leg: No edema.  Skin:    General: Skin is warm and dry.     Capillary Refill: Capillary refill takes less than 2 seconds.  Neurological:     Mental Status: He is alert. Mental status is at baseline.  Psychiatric:        Mood and Affect: Mood normal.        Behavior: Behavior  normal.    ED Results / Procedures / Treatments   Labs (all labs ordered are listed, but only abnormal results are displayed) Labs Reviewed  BASIC METABOLIC PANEL - Abnormal; Notable for the following components:      Result Value   Potassium 3.2 (*)    Glucose, Bld 136 (*)    All other components within normal limits  MAGNESIUM  CBC  TSH    EKG EKG Interpretation  Date/Time:  Sunday May 25 2021 17:28:23 EDT Ventricular Rate:  146 PR Interval:  126 QRS Duration: 132 QT Interval:  302 QTC Calculation: 470 R Axis:   -24 Text Interpretation: Sinus tachycardia with occasional Premature ventricular complexes Right bundle branch block T wave abnormality, consider inferolateral ischemia Abnormal ECG Confirmed by Fredia Sorrow [phone]) on 05/25/2021 5:49:14 PM  Radiology DG Chest Port 1 View  Result Date: 05/25/2021 CLINICAL DATA:  Chest pain. EXAM: PORTABLE CHEST 1 VIEW COMPARISON:  None. FINDINGS: The heart size and mediastinal contours are within normal limits. Both lungs are clear. The visualized skeletal structures are unremarkable. IMPRESSION: No active disease. Electronically Signed   By: Fidela Salisbury M.D.   On: 05/25/2021 18:41     .Critical Care  Date/Time: 05/25/2021 9:03 PM Performed by: Tedd Sias, PA Authorized by: Tedd Sias, PA   Critical care provider statement:    Critical care time (minutes):  35   Critical care time was exclusive of:  Separately billable procedures and treating  other patients and teaching time   Critical care was time spent personally by me on the following activities:  Discussions with consultants, evaluation of patient's response to treatment, examination of patient, review of old charts, re-evaluation of patient's condition, pulse oximetry, ordering and review of radiographic studies, ordering and review of laboratory studies and ordering and performing treatments and interventions (aflutter w rapid HR)   I assumed  direction of critical care for this patient from another provider in my specialty: no    Procedures .Cardioversion  Date/Time: 05/25/2021 8:25 PM Performed by: Tedd Sias, PA Authorized by: Tedd Sias, PA   Consent:    Consent obtained:  Verbal and written   Consent given by:  Patient   Risks discussed:  Induced arrhythmia, pain, cutaneous burn and death   Alternatives discussed:  No treatment, rate-control medication, referral and delayed treatment Pre-procedure details:    Rhythm:  Atrial fibrillation   Electrode placement:  Anterior-lateral Patient sedated: Yes. Refer to sedation procedure documentation for details of sedation.  Attempt one:    Cardioversion mode:  Synchronous   Waveform:  Biphasic   Shock (joules) attempt one: 100 J.   Shock outcome:  Conversion to normal sinus rhythm Post-procedure details:    Patient status:  Awake   Patient tolerance of procedure:  Tolerated well, no immediate complications Comments:     DC cardioversion was completed by myself with my attending physician Dr. Rogene Houston in the room who administered conscious sedation with etomidate 10 mg.  Patient tolerated procedure well.  Normal sinus rhythm was confirmed on EKG after cardioversion and patient's rate has remained between 60 and 85 since cardioversion.    Medications Ordered in ED Medications  adenosine (ADENOCARD) 6 MG/2ML injection 6 mg (6 mg Intravenous Given 05/25/21 1925)  apixaban (ELIQUIS) tablet 10 mg (10 mg Oral Given 05/25/21 2018)  etomidate (AMIDATE) injection 10 mg (10 mg Intravenous Given 05/25/21 1946)    ED Course  I have reviewed the triage vital signs and the nursing notes.  Pertinent labs & imaging results that were available during my care of the patient were reviewed by me and considered in my medical decision making (see chart for details).    MDM Rules/Calculators/A&P                          Pt with rapid HR found to be in a flutter with rapid  ventricular response.  Discussed with cardiology who agrees with plan to give adenosine confirmed atrial flutter given patient's original rate suspect 2-1 a flutter.  Cardiology recommends cardioversion.  Patient is on baby aspirin 81 mg daily also notably had echocardiogram of EF approximately 65% this was done in 2018.  Patient provided with 1 dose of 10 mg Eliquis will discharge home with same medication 5 mg twice daily  CHADSVASC - score with is 2 will put on eliquis.   Successful DC cardioversion with 100 J  Reviewed EKG after cardioversion shows normal sinus rhythm  Reevaluated patient who is mentating well at this time.  Following commands.  We will allow him to tablets medication and discharge afterwards.  Discussed again with cardiology via text message.  Magnesium within normal limits.  BMP unremarkable apart from very mild hypokalemia.  CBC without leukocytosis or anemia TSH is pending this is a send out lab from this facility.  Chest x-ray with no pulmonary edema or cardiomegaly noted.  Plan to discharge patient home with Eliquis  twice daily 5 mg.  He will stop taking aspirin.  He will have a 30 mg immediate release diltiazem pill in the pocket.  I discussed with cardiology he recommends if he has symptoms similar today especially of heart rate greater 130 will take diltiazem if his rate does not decrease to less than 110 or if he is continue to have symptoms will come to ER.  EKG shows normal sinus rhythm with right bundle branch block which patient had prior  Final Clinical Impression(s) / ED Diagnoses Final diagnoses:  Typical atrial flutter (HCC)  Atrial flutter with rapid ventricular response ([place])    Rx / DC Orders ED Discharge Orders          Ordered    Amb Referral to AFIB Clinic        05/25/21 2021    apixaban (ELIQUIS) 5 MG TABS tablet  2 times daily        05/25/21 2022    diltiazem (CARDIZEM) 30 MG tablet  Once PRN,   Status:  Discontinued        05/25/21  2023    diltiazem (CARDIZEM) 30 MG tablet  Once PRN        05/25/21 2037             Tedd Sias, [place] 05/25/21 2104    Fredia Sorrow, MD 06/05/21 1343

## 2021-05-25 NOTE — Progress Notes (Signed)
Virtual Visit Consent   Bryan [name], you are scheduled for a virtual visit with a [facility] provider today.     Just as with appointments in the office, your consent must be obtained to participate.  Your consent will be active for this visit and any virtual visit you may have with one of our providers in the next 365 days.     If you have a MyChart account, a copy of this consent can be sent to you electronically.  All virtual visits are billed to your insurance company just like a traditional visit in the office.    As this is a virtual visit, video technology does not allow for your provider to perform a traditional examination.  This may limit your provider's ability to fully assess your condition.  If your provider identifies any concerns that need to be evaluated in person or the need to arrange testing (such as labs, EKG, etc.), we will make arrangements to do so.     Although advances in technology are sophisticated, we cannot ensure that it will always work on either your end or our end.  If the connection with a video visit is poor, the visit may have to be switched to a telephone visit.  With either a video or telephone visit, we are not always able to ensure that we have a secure connection.     I need to obtain your verbal consent now.   Are you willing to proceed with your visit today?    Bryan [name] has provided verbal consent on 05/25/2021 for a virtual visit (video or telephone).   Evelina Dun, FNP   Date: 05/25/2021 4:46 PM   Virtual Visit via Video Note   IEvelina Dun, connected [id number]@ ([id number], [birth year]) on 05/25/21 at  4:45 PM EDT by a video-enabled telemedicine application and verified that I am speaking with the correct person using two identifiers.  Location: Patient: Virtual Visit Location Patient: Home Provider: Virtual Visit Location Provider: Home   I discussed the limitations of evaluation and management by telemedicine and the  availability of in person appointments. The patient expressed understanding and agreed to proceed.    History of Present Illness: Bryan [name] is a 60 y.o. who identifies as a male who was assigned male at birth, and is being seen today for elevated heart rate. He reports his heart rate has been 135 and above. He states he has been active today, but his watch notified him his heart rate was elevated.  Denies any caffeine, or excessive stress.  Denies any SOB or nausea. Does report light headiness and fatigue.   Denies any chest pain or edema.    HPI: HPI  Problems:  Patient Active Problem List   Diagnosis Date Noted   Prediabetes 10/20/2018   Left chronic serous otitis media 07/26/2018   Essential hypertension 01/05/2018   Vitamin D deficiency 01/05/2018   Other fatigue 09/08/2017   Right bundle branch block 09/08/2017   Class 3 severe obesity with serious comorbidity and body mass index (BMI) of 45.0 to 49.9 in adult [facility]) 09/08/2017   Urolithiasis    PCP NOTES >>>>>>>>>>>>>>>>>>>>>>>> 05/23/2016   Hyperglycemia 50/27/7412   Chronic folliculitis 87/86/7672   Allergic rhinitis 02/27/2013   Insomnia 02/27/2013   Annual physical exam 02/26/2012   Hyperlipidemia 06/05/2008   Obstructive sleep apnea 06/05/2008    Allergies:  Allergies  Allergen Reactions   Lisinopril Cough   Medications:  Current Outpatient Medications:  aspirin 81 MG tablet, Take 81 mg by mouth daily., Disp: , Rfl:    atorvastatin (LIPITOR) 20 MG tablet, Take 1 tablet (20 mg total) by mouth at bedtime., Disp: 90 tablet, Rfl: 3   azelastine (ASTELIN) 0.1 % nasal spray, Place 2 sprays into both nostrils at bedtime as needed for rhinitis. Use in each nostril as directed, Disp: 30 mL, Rfl: 5   buPROPion (WELLBUTRIN SR) 150 MG 12 hr tablet, Take 1 tablet (150 mg total) by mouth in the morning., Disp: 30 tablet, Rfl: 0   CO ENZYME Q-10 PO, Take by mouth daily., Disp: , Rfl:    fish oil-omega-3 fatty acids 1000  MG capsule, Take 1 g by mouth daily., Disp: , Rfl:    hydrochlorothiazide (HYDRODIURIL) 25 MG tablet, Take 1 tablet (25 mg total) by mouth daily., Disp: 30 tablet, Rfl: 0   Insulin Pen Needle (BD PEN NEEDLE NANO U/F) 32G X 4 MM MISC, 1 each by Does not apply route daily. Use one pen needle once daily to inject Saxenda., Disp: 100 each, Rfl: 0   Liraglutide -Weight Management (SAXENDA) 18 MG/3ML SOPN, Inject 3 mg into the skin daily., Disp: 15 mL, Rfl: 0   losartan (COZAAR) 50 MG tablet, Take 1 tablet (50 mg total) by mouth daily., Disp: 30 tablet, Rfl: 0   Melatonin 1 MG CAPS, Take 1 mg by mouth at bedtime as needed. , Disp: , Rfl:    metFORMIN (GLUCOPHAGE) 500 MG tablet, TAKE 1 TABLET(500 MG) BY MOUTH THREE TIMES DAILY, Disp: 90 tablet, Rfl: 0   Multiple Vitamins-Minerals (CENTRUM PO), Take 1 tablet by mouth daily., Disp: , Rfl:    Sodium Sulfate-Mag Sulfate-KCl (SUTAB) [phone] MG TABS, Take 24 tablets by mouth as directed. MANUFACTURER CODES!! BIN: [id number] PCN: CN GROUP: [id number] MEMBER ID: [id number];LXB AS SECONDARY INSURANCE or cash  ;NO PRIOR AUTHORIZATION, Disp: 24 tablet, Rfl: 0   Vitamin D, Ergocalciferol, (DRISDOL) 1.25 MG (50000 UNIT) CAPS capsule, Take 1 capsule (50,000 Units total) by mouth every 7 (seven) days., Disp: 4 capsule, Rfl: 0  Observations/Objective: Patient is well-developed, well-nourished in no acute distress.  Resting comfortably in bed at home.  Head is normocephalic, atraumatic.  No labored breathing.  Speech is clear and coherent with logical content.  Patient is alert and oriented at baseline.   Bp-155/82, HR- 135   Assessment and Plan: 1. Tachycardia Instructed patient to go to Urgent Care for EKG, given tachycardia for hours even laying in bed.  Avoid caffeine and stress   Follow Up Instructions: I discussed the assessment and treatment plan with the patient. The patient was provided an opportunity to ask questions and all were answered. The patient  agreed with the plan and demonstrated an understanding of the instructions.  A copy of instructions were sent to the patient via MyChart.  The patient was advised to call back or seek an in-person evaluation if the symptoms worsen or if the condition fails to improve as anticipated.  Time:  I spent 8 minutes with the patient via telehealth technology discussing the above problems/concerns.    Evelina Dun, FNP

## 2021-05-25 NOTE — Telephone Encounter (Signed)
Paged by Karen Chafe regarding Bryan [name] with new onset SVT. Reviewed ECG which appears to be 2:1 AFL. Provider said he did become more irregular after ECG and questioned underlying AF. He has no prior sx of atrial arrhythmias and does not follow with cardiology. TTE (09/21/17) with preserved EF, grade II diastolic dysfxn, and normal LA size. He had symptom onset of palpitations with his apple watch also alerted at 3 AM this morning.  He started to feel fatigued with general malaise during the same time but denied any chest pain or exertional shortness of breath.  CHA2DS2-VASc (2) for HTN/DM2.  Given that he has known onset of AF I think it is reasonable to proceed with DCCV in the ED and if converted to normal sinus rhythm he can be discharged home.  They will try to give a adenosine prior to cardioversion to see if this is atrial flutter or atrial fibrillation given that his prior ECG looks more consistent with atrial flutter.  Recommended they give Eliquis 10 mg p.o. x1 dose now followed by Eliquis 5 mg p.o. twice daily until he has follow-up with cardiology.  Given his elevated CHA2DS2-VASc I would continue Eliquis indefinitely.  If this is atrial flutter he will be a candidate for atrial flutter ablation and then could subsequently be taken off [place] if no recurrence.  If this is atrial fibrillation I would recommend continued Eliquis either way.  TSH in February 2021 was no repeat since then.  ED provider will request for A. fib clinic follow-up.  Recommendations:  - adenosine now to better eval atrial arrhythmia - eliquis 10 mg PO x1 dose now - eliquis 5 mg PO bid starting 06/20 AM and continued until cardiology follow up, provide year prescription refills - diltiazem 30 mg IR PO q6h prn recurrence of symptoms or elevated HR on apple watch - recheck TSH today  - referral to AF clinic

## 2021-05-26 ENCOUNTER — Telehealth: Payer: Self-pay | Admitting: Internal Medicine

## 2021-05-26 ENCOUNTER — Other Ambulatory Visit (HOSPITAL_BASED_OUTPATIENT_CLINIC_OR_DEPARTMENT_OTHER): Payer: Self-pay

## 2021-05-26 NOTE — Telephone Encounter (Signed)
PT HAD AN INCIDENT OF A FLUTTER YESTERDAY IN THE ED- HE WAS SHOCKED BACK INTO [facility]- HE WAS STARTED ON ELIQUIS -  HE HAS OV WITH CARDIO Friday 6-24- PT IS ASKING SHOULD HE RS HIS COLON, WHICH IS Thursday 6-23 FOR HX COLON POLYPS  PT QUESTIONS WHEN HE SHOULD RS THIS -- I INSTRUCTED PT HE WILL DEF NEED TO KEEP CARDIO  OV FRI.  PLEASE ADVISE--  THANKS MARIE PV

## 2021-05-26 NOTE — Telephone Encounter (Signed)
Spoke with Bryan [name]- gave him recommendations per Dr Henrene Pastor  LEC 6-23 colon canceled Recall placed for 6 months- #[id number] Explained to pt he will need complete cardiac workup and clearance prior to a colon in 3-6 months- pt verbalized understanding  Pt aware he can call after 3 months and RS colon if cardio clears - notes in Epic on Eliquis

## 2021-05-26 NOTE — Progress Notes (Signed)
Chief Complaint:   OBESITY Bryan [name] is here to discuss his progress with his obesity treatment plan along with follow-up of his obesity related diagnoses. Bryan [name] is on the Category 3 Plan and states he is following his eating plan approximately 70% of the time. Bryan [name] states he is on the elliptical, rowing, and walking for 45 minutes 5-6 times per week.  Today's visit was #: 32 Starting weight: 326 lbs Starting date: 09/07/2017 Today's weight: 319 lbs Today's date: 05/21/2021 Total lbs lost to date: 7 Total lbs lost since last in-office visit: 6  Interim History: Bryan [name] has done well with weight loss even while on vacation. He is getting ready to have a colonoscopy soon. He has made a lot of better choices with his food and activity.  Subjective:   1. Essential hypertension Bryan [name]'s blood pressure is well controlled on his medications. He denies signs of hypotension.  2. Vitamin D deficiency Bryan [name] is stable on Vit D, and he requests a refill today.  3. Other depression with emotional eating Bryan [name] is stable on his medications, and he is doing well decreasing emotional eating behaviors. His blood pressure is stable.  4. At risk for dehydration Bryan [name] is at risk for dehydration due to inadequate protein intake.  Assessment/Plan:   1. Essential hypertension Bryan [name] is working on healthy weight loss and exercise to improve blood pressure control. We will watch for signs of hypotension as he continues his lifestyle modifications. We will refill losartan and hydrochlorothiazide for 1 month.  - hydrochlorothiazide (HYDRODIURIL) 25 MG tablet; Take 1 tablet (25 mg total) by mouth daily.  Dispense: 30 tablet; Refill: 0 - losartan (COZAAR) 50 MG tablet; Take 1 tablet (50 mg total) by mouth daily.  Dispense: 30 tablet; Refill: 0  2. Vitamin D deficiency Low Vitamin D level contributes to fatigue and are associated with obesity, breast, and colon cancer. We will refill  prescription Vitamin D for 1 month. Bryan [name] will follow-up for routine testing of Vitamin D, at least 2-3 times per year to avoid over-replacement.  - Vitamin D, Ergocalciferol, (DRISDOL) 1.25 MG (50000 UNIT) CAPS capsule; Take 1 capsule (50,000 Units total) by mouth every 7 (seven) days.  Dispense: 4 capsule; Refill: 0  3. Other depression with emotional eating Behavior modification techniques were discussed today to help Liev deal with his emotional/non-hunger eating behaviors. We will refill Wellbutrin SR for 1 month. Orders and follow up as documented in patient record.   - buPROPion (WELLBUTRIN SR) 150 MG 12 hr tablet; Take 1 tablet (150 mg total) by mouth in the morning.  Dispense: 30 tablet; Refill: 0  4. At risk for dehydration Bryan [name] was given approximately 15 minutes dehydration prevention counseling today. Bryan [name] is at risk for dehydration due to weight loss and current medication(s). He was encouraged to hydrate and monitor fluid status to avoid dehydration as well as weight loss plateaus.   5. Obesity with current BMI 44.5 Bryan [name] is currently in the action stage of change. As such, his goal is to continue with weight loss efforts. He has agreed to the Category 3 Plan.   Exercise goals: As is.  Behavioral modification strategies: decreasing simple carbohydrates and increasing water intake.  Bryan [name] has agreed to follow-up with our clinic in 2 to 3 weeks. He was informed of the importance of frequent follow-up visits to maximize his success with intensive lifestyle modifications for his multiple health conditions.   Objective:   Blood pressure 132/69, pulse 71, temperature 98 F (36.7  C), height 5\' 11"  (1.803 m), weight (!) 319 lb (144.7 kg), SpO2 97 %. Body mass index is 44.49 kg/m.  General: Cooperative, alert, well developed, in no acute distress. HEENT: Conjunctivae and lids unremarkable. Cardiovascular: Regular rhythm.  Lungs: Normal work of  breathing. Neurologic: No focal deficits.   Lab Results  Component Value Date   CREATININE 0.99 05/25/2021   BUN 20 05/25/2021   NA 136 05/25/2021   K 3.2 (L) 05/25/2021   CL 102 05/25/2021   CO2 25 05/25/2021   Lab Results  Component Value Date   ALT 30 03/05/2021   AST 19 03/05/2021   ALKPHOS 58 03/05/2021   BILITOT 0.2 03/05/2021   Lab Results  Component Value Date   HGBA1C 6.2 (H) 02/10/2021   HGBA1C 6.3 (H) 09/11/2020   HGBA1C 6.3 (H) 05/29/2020   HGBA1C 6.4 (H) 01/10/2020   HGBA1C 6.2 07/07/2019   Lab Results  Component Value Date   INSULIN 22.6 02/10/2021   INSULIN 20.9 05/29/2020   INSULIN 24.0 01/10/2020   INSULIN 21.9 11/17/2018   INSULIN 24.0 08/18/2018   Lab Results  Component Value Date   TSH 1.600 05/25/2021   Lab Results  Component Value Date   CHOL 157 02/10/2021   HDL 41 02/10/2021   LDLCALC 79 02/10/2021   LDLDIRECT 136.0 11/16/2016   TRIG 222 (H) 02/10/2021   CHOLHDL 4 07/07/2019   Lab Results  Component Value Date   WBC 10.4 05/25/2021   HGB 15.2 05/25/2021   HCT 44.5 05/25/2021   MCV 84.4 05/25/2021   PLT 325 05/25/2021   No results found for: IRON, TIBC, FERRITIN  Attestation Statements:   Reviewed by clinician on day of visit: allergies, medications, problem list, medical history, surgical history, family history, social history, and previous encounter notes.   I, Trixie Dredge, am acting as transcriptionist for Dennard Nip, MD.  I have reviewed the above documentation for accuracy and completeness, and I agree with the above. -  Dennard Nip, MD

## 2021-05-26 NOTE — Telephone Encounter (Signed)
Patient called and said he was at the ER yesterday and would like to discuss some things with a nurse also to confirm procedure scheduled pending on discussion.

## 2021-05-29 ENCOUNTER — Encounter: Payer: BC Managed Care – PPO | Admitting: Internal Medicine

## 2021-05-30 ENCOUNTER — Ambulatory Visit (HOSPITAL_COMMUNITY)
Admission: RE | Admit: 2021-05-30 | Discharge: 2021-05-30 | Disposition: A | Discharge status: Home / Self Care | Payer: BC Managed Care – PPO | Source: Ambulatory Visit | Attending: Physician Assistant | Admitting: Physician Assistant

## 2021-05-30 ENCOUNTER — Encounter (HOSPITAL_COMMUNITY): Payer: Self-pay | Admitting: Physician Assistant

## 2021-05-30 ENCOUNTER — Other Ambulatory Visit: Payer: Self-pay

## 2021-05-30 VITALS — BP 120/76 | HR 83 | Ht 71.0 in | Wt 322.6 lb

## 2021-05-30 DIAGNOSIS — I10 Essential (primary) hypertension: Secondary | ICD-10-CM | POA: Diagnosis not present

## 2021-05-30 DIAGNOSIS — Z79899 Other long term (current) drug therapy: Secondary | ICD-10-CM | POA: Insufficient documentation

## 2021-05-30 DIAGNOSIS — Z6841 Body Mass Index (BMI) 40.0 and over, adult: Secondary | ICD-10-CM | POA: Diagnosis not present

## 2021-05-30 DIAGNOSIS — Z8249 Family history of ischemic heart disease and other diseases of the circulatory system: Secondary | ICD-10-CM | POA: Insufficient documentation

## 2021-05-30 DIAGNOSIS — G4733 Obstructive sleep apnea (adult) (pediatric): Secondary | ICD-10-CM | POA: Diagnosis not present

## 2021-05-30 DIAGNOSIS — Z7901 Long term (current) use of anticoagulants: Secondary | ICD-10-CM | POA: Diagnosis not present

## 2021-05-30 DIAGNOSIS — I4892 Unspecified atrial flutter: Secondary | ICD-10-CM | POA: Insufficient documentation

## 2021-05-30 DIAGNOSIS — E669 Obesity, unspecified: Secondary | ICD-10-CM | POA: Insufficient documentation

## 2021-05-30 NOTE — Progress Notes (Signed)
Primary Care Physician: Colon Branch, MD Primary Cardiologist: none Primary Electrophysiologist: none Referring Physician: [place]   Bryan [name] is a 60 y.o. male with a history of HTN, OSA, atrial flutter who presents for consultation in the [facility].  The patient was initially diagnosed with atrial flutter 05/25/21 after presenting to the ED with heart racing noted on his smart watch. He reports that he felt "sluggish" but this was mild. He would not have presented to the ED if his watch had not shown a heart rate of 140-150 bpm. ECG at the ED showed atrial flutter with 2:1 conduction and he underwent DCCV. Patient is on Eliquis for a CHADS2VASC score of 1. He is compliant with CPAP therapy. He does admit to drinking 6-7 alcoholic drinks per week.   Today, he denies symptoms of palpitations, chest pain, shortness of breath, orthopnea, PND, lower extremity edema, dizziness, presyncope, syncope, snoring, daytime somnolence, bleeding, or neurologic sequela. The patient is tolerating medications without difficulties and is otherwise without complaint today.    Atrial Fibrillation Risk Factors:  he does have symptoms or diagnosis of sleep apnea. he is compliant with CPAP therapy. he does not have a history of rheumatic fever. he does have a history of alcohol use. The patient does have a history of early familial atrial fibrillation or other arrhythmias. Mother has afib.  he has a BMI of Body mass index is 44.99 kg/m.[name] Filed Weights   05/30/21 0852  Weight: (!) 146.3 kg    Family History  Problem Relation Age of Onset   Ovarian cancer Mother    Cancer Mother        ovarian   Hypertension Mother    Hyperlipidemia Mother    Obesity Mother    Cancer Father 16       renal   Kidney disease Father    Sleep apnea Father    Cancer Brother        leukemia   Diabetes Brother    Colon cancer Maternal Aunt 51   Cancer Paternal Grandmother         breast   Prostate cancer Neg Hx    Esophageal cancer Neg Hx    Rectal cancer Neg Hx    Stomach cancer Neg Hx    CAD Neg Hx    Colon polyps Neg Hx      Atrial Fibrillation Management history:  Previous antiarrhythmic drugs: none Previous cardioversions: 05/25/21 Previous ablations: none CHADS2VASC score: 1 Anticoagulation history: Eliquis   Past Medical History:  Diagnosis Date   Allergy    Dental crowns present    Hyperlipidemia    Lower back pain    Nephrolithiasis 2017   Prediabetes 09/04/2014   Seasonal allergies    Sebaceous cyst 04/2013   upper back   Sleep apnea    uses CPAP nightly   Urolithiasis 10/2016   Past Surgical History:  Procedure Laterality Date   ADENOIDECTOMY     as a child   bone marrow donor     under anesthesia   CHOLECYSTECTOMY  01/12/2012   Procedure: LAPAROSCOPIC CHOLECYSTECTOMY WITH INTRAOPERATIVE CHOLANGIOGRAM;  Surgeon: [name], MD;  Location: [facility];  Service: General;  Laterality: N/A;   COLONOSCOPY     CYST EXCISION     cyst removal from back   EAR CYST EXCISION N/A 05/04/2013   Procedure: Excision subcutaneous mass posterior neck;  Surgeon: Imogene Burn. [name], MD;  Location: [facility];  Service: General;  Laterality: N/A;   POLYPECTOMY     wisdom teeth      Current Outpatient Medications  Medication Sig Dispense Refill   apixaban (ELIQUIS) 5 MG TABS tablet Take 1 tablet by mouth 2 times daily 60 tablet 0   atorvastatin (LIPITOR) 20 MG tablet Take 1 tablet (20 mg total) by mouth at bedtime. 90 tablet 3   azelastine (ASTELIN) 0.1 % nasal spray Place 2 sprays into both nostrils at bedtime as needed for rhinitis. Use in each nostril as directed 30 mL 5   buPROPion (WELLBUTRIN SR) 150 MG 12 hr tablet Take 1 tablet (150 mg total) by mouth in the morning. 30 tablet 0   CO ENZYME Q-10 PO Take by mouth daily.     diltiazem (CARDIZEM) 30 MG tablet Take 1 - 2 tablets by mouth every 6 hours as needed for heart rate  greater than 130 30 tablet 0   fish oil-omega-3 fatty acids 1000 MG capsule Take 1 g by mouth daily.     hydrochlorothiazide (HYDRODIURIL) 25 MG tablet Take 1 tablet (25 mg total) by mouth daily. 30 tablet 0   Insulin Pen Needle (BD PEN NEEDLE NANO U/F) 32G X 4 MM MISC 1 each by Does not apply route daily. Use one pen needle once daily to inject Saxenda. 100 each 0   Liraglutide -Weight Management (SAXENDA) 18 MG/3ML SOPN Inject 3 mg into the skin daily. 15 mL 0   losartan (COZAAR) 50 MG tablet Take 1 tablet (50 mg total) by mouth daily. 30 tablet 0   Melatonin 1 MG CAPS Take 1 mg by mouth at bedtime as needed.      metFORMIN (GLUCOPHAGE) 500 MG tablet TAKE 1 TABLET(500 MG) BY MOUTH THREE TIMES DAILY 90 tablet 0   Multiple Vitamins-Minerals (CENTRUM PO) Take 1 tablet by mouth daily.     Sodium Sulfate-Mag Sulfate-KCl (SUTAB) [phone] MG TABS Take 24 tablets by mouth as directed. MANUFACTURER CODES!! BIN: [id number] PCN: CN GROUP: [id number] MEMBER ID: [id number];QJJ AS SECONDARY INSURANCE or cash  ;NO PRIOR AUTHORIZATION 24 tablet 0   Vitamin D, Ergocalciferol, (DRISDOL) 1.25 MG (50000 UNIT) CAPS capsule Take 1 capsule (50,000 Units total) by mouth every 7 (seven) days. 4 capsule 0   No current facility-administered medications for this encounter.    Allergies  Allergen Reactions   Lisinopril Cough    Social History   Socioeconomic History   Marital status: Married    Spouse name: Patty   Number of children: 3   Years of education: Not on file   Highest education level: Not on file  Occupational History   Occupation:  manufactoring rep, A/C units     Employer: [employer]  Tobacco Use   Smoking status: Never   Smokeless tobacco: Never  Vaping Use   Vaping Use: Never used  Substance and Sexual Activity   Alcohol use: Yes    Alcohol/week: 2.0 standard drinks    Types: 2 Glasses of wine per week    Comment: 1 glass wine every other day   Drug use: No   Sexual activity:  Not on file  Other Topics Concern   Not on file  Social History Narrative   3 children, 1 at home,  2 in college    Wife w/ breast ca dx 2016, doing well   she developed SZs 2017, sees Dr Jannifer Franklin          Social Determinants of Health  Financial Resource Strain: Not on file  Food Insecurity: Not on file  Transportation Needs: Not on file  Physical Activity: Not on file  Stress: Not on file  Social Connections: Not on file  Intimate Partner Violence: Not on file     ROS- All systems are reviewed and negative except as per the HPI above.  Physical Exam: Vitals:   05/30/21 0852  BP: 120/76  Pulse: 83  Weight: (!) 146.3 kg  Height: 5\' 11"  (1.803 m)    GEN- The patient is a well appearing obese male, alert and oriented x 3 today.   Head- normocephalic, atraumatic Eyes-  Sclera clear, conjunctiva pink Ears- hearing intact Oropharynx- clear Neck- supple  Lungs- Clear to ausculation bilaterally, normal work of breathing Heart- Regular rate and rhythm, no murmurs, rubs or gallops  GI- soft, NT, ND, + BS Extremities- no clubbing, cyanosis, or edema MS- no significant deformity or atrophy Skin- no rash or lesion Psych- euthymic mood, full affect Neuro- strength and sensation are intact  Wt Readings from Last 3 Encounters:  05/30/21 (!) 146.3 kg  05/25/21 (!) 146.1 kg  05/21/21 (!) 144.7 kg    EKG today demonstrates  SR, RBBB Vent. rate 83 BPM PR interval 168 ms QRS duration 154 ms QT/QTcB 414/486 ms   Epic records are reviewed at length today  CHA2DS2-VASc Score = 1  The patient's score is based upon: CHF History: No HTN History: Yes Diabetes History: No Stroke History: No Vascular Disease History: No Age Score: 0 Gender Score: 0     ASSESSMENT AND PLAN: 1. Atrial flutter The patient's CHA2DS2-VASc score is 1, indicating a 0.6% annual risk of stroke.   General education about atrial flutter provided and questions answered. We also discussed his  stroke risk and the risks and benefits of anticoagulation. Will need to continue Eliquis 5 mg BID for at least 4 weeks post DCCV. Could consider d/c at that point with low CV score. Check echocardiogram Encouraged alcohol reduction/avoidance.   2. Obesity Body mass index is 44.99 kg/m. Lifestyle modification was discussed at length including regular exercise and weight reduction. [facility]  3. Obstructive sleep apnea The importance of adequate treatment of sleep apnea was discussed today in order to improve our ability to maintain sinus rhythm long term. Patient reports compliance with CPAP therapy.  4. HTN Stable, no changes today.   Follow up in the AF clinic in one month.    [facility] [address] [place], [place] [address] [phone] 05/30/2021 1:34 PM

## 2021-05-31 ENCOUNTER — Other Ambulatory Visit (INDEPENDENT_AMBULATORY_CARE_PROVIDER_SITE_OTHER): Payer: Self-pay | Admitting: Family Medicine

## 2021-05-31 DIAGNOSIS — R7303 Prediabetes: Secondary | ICD-10-CM

## 2021-06-02 NOTE — Telephone Encounter (Signed)
Last OV with Dr. Beasley 

## 2021-06-10 ENCOUNTER — Ambulatory Visit (INDEPENDENT_AMBULATORY_CARE_PROVIDER_SITE_OTHER): Payer: BC Managed Care – PPO | Admitting: Family Medicine

## 2021-06-10 ENCOUNTER — Other Ambulatory Visit: Payer: Self-pay

## 2021-06-10 ENCOUNTER — Encounter (INDEPENDENT_AMBULATORY_CARE_PROVIDER_SITE_OTHER): Payer: Self-pay | Admitting: Family Medicine

## 2021-06-10 VITALS — BP 132/71 | HR 88 | Temp 98.1°F | Ht 71.0 in | Wt 313.0 lb

## 2021-06-10 DIAGNOSIS — Z9189 Other specified personal risk factors, not elsewhere classified: Secondary | ICD-10-CM | POA: Diagnosis not present

## 2021-06-10 DIAGNOSIS — I4892 Unspecified atrial flutter: Secondary | ICD-10-CM

## 2021-06-10 DIAGNOSIS — Z6841 Body Mass Index (BMI) 40.0 and over, adult: Secondary | ICD-10-CM

## 2021-06-10 DIAGNOSIS — E8881 Metabolic syndrome: Secondary | ICD-10-CM

## 2021-06-10 DIAGNOSIS — E559 Vitamin D deficiency, unspecified: Secondary | ICD-10-CM | POA: Diagnosis not present

## 2021-06-10 MED ORDER — METFORMIN HCL 500 MG PO TABS
ORAL_TABLET | ORAL | 0 refills | Status: DC
Start: 2021-06-10 — End: 2021-07-09

## 2021-06-10 MED ORDER — SAXENDA 18 MG/3ML ~~LOC~~ SOPN: 3.0000 mg | PEN_INJECTOR | Freq: Every day | SUBCUTANEOUS | 0 refills | Status: DC

## 2021-06-10 MED ORDER — VITAMIN D (ERGOCALCIFEROL) 1.25 MG (50000 UNIT) PO CAPS: 50000.0000 [IU] | ORAL_CAPSULE | ORAL | 0 refills | Status: DC

## 2021-06-16 NOTE — Progress Notes (Signed)
Chief Complaint:   OBESITY Bryan [name] is here to discuss his progress with his obesity treatment plan along with follow-up of his obesity related diagnoses. Bryan [name] is on the Category 3 Plan and states he is following his eating plan approximately 75% of the time. Bryan [name] states he is on the treadmill or row machine for 30 minutes 6 times per week.  Today's visit was #: 30 Starting weight: 326 lbs Starting date: 09/07/2017 Today's weight: 313 lbs Today's date: 06/10/2021 Total lbs lost to date: 13 Total lbs lost since last in-office visit: 6  Interim History: Bryan [name] continues to do well with weight loss. He is sleeping 6-7 hours per night on an average, which is good for him. He is trying to increase daytime water intake.  Subjective:   1. Vitamin D deficiency Hector is stable on Vit D, and he denies nausea, vomiting, or muscle weakness.  2. Insulin resistance Bryan [name] is stable on metformin. He is doing well with diet and weight loss. He denies nausea, vomiting, or hypoglycemia.  3. Atrial flutter, unspecified type (Bryan [name]) Bryan [name] had an episode last month. He was cardioverted and he is now on Eliquis per [facility]. He has an echocardiogram scheduled soon. He is increasing exercise as well.  4. At risk for heart disease Bryan [name] is at a higher than average risk for cardiovascular disease due to obesity.   Assessment/Plan:   1. Vitamin D deficiency Low Vitamin D level contributes to fatigue and are associated with obesity, breast, and colon cancer. We will refill prescription Vitamin D for 1 month. Giankarlo will follow-up for routine testing of Vitamin D, at least 2-3 times per year to avoid over-replacement.  - Vitamin D, Ergocalciferol, (DRISDOL) 1.25 MG (50000 UNIT) CAPS capsule; Take 1 capsule (50,000 Units total) by mouth every 7 (seven) days.  Dispense: 4 capsule; Refill: 0  2. Insulin resistance Matty will continue to work on weight loss, exercise, and decreasing simple  carbohydrates to help decrease the risk of diabetes. We will refill metformin for 1 month. Alexi agreed to follow-up with [place] as directed to closely monitor his progress.  - metFORMIN (GLUCOPHAGE) 500 MG tablet; TAKE 1 TABLET(500 MG) BY MOUTH THREE TIMES DAILY  Dispense: 90 tablet; Refill: 0  3. Atrial flutter, unspecified type (Pablo) Bryan [name] is to follow up with Cardiology and we will continue to work on weight loss to help decrease cardiac complications.  4. At risk for heart disease Bryan [name] was given approximately 30 minutes of coronary artery disease prevention counseling today. He is 60 y.o. male and has risk factors for heart disease including obesity. We discussed intensive lifestyle modifications today with an emphasis on specific weight loss instructions and strategies.   Repetitive spaced learning was employed today to elicit superior memory formation and behavioral change.  5. Obesity with current BMI 43.7 Bryan [name] is currently in the action stage of change. As such, his goal is to continue with weight loss efforts. He has agreed to the Category 3 Plan.   We discussed various medication options to help Bryan [name] with his weight loss efforts and we both agreed to increase Saxenda to 3.0 mg q daily with no refills.  - Liraglutide -Weight Management (SAXENDA) 18 MG/3ML SOPN; Inject 3 mg into the skin daily.  Dispense: 15 mL; Refill: 0  Exercise goals: As is, Bryan [name] is to work on cardio and strengthening 20 minutes 7 times per week.  Behavioral modification strategies: increasing lean protein intake.  Bryan [name] has agreed to follow-up  with our clinic in 2 to 3 weeks. He was informed of the importance of frequent follow-up visits to maximize his success with intensive lifestyle modifications for his multiple health conditions.   Objective:   Blood pressure 132/71, pulse 88, temperature 98.1 F (36.7 C), height 5\' 11"  (1.803 m), weight (!) 313 lb (142 kg), SpO2 98 %. Body mass index is  43.65 kg/m.  General: Cooperative, alert, well developed, in no acute distress. HEENT: Conjunctivae and lids unremarkable. Cardiovascular: Regular rhythm.  Lungs: Normal work of breathing. Neurologic: No focal deficits.   Lab Results  Component Value Date   CREATININE 0.99 05/25/2021   BUN 20 05/25/2021   NA 136 05/25/2021   K 3.2 (L) 05/25/2021   CL 102 05/25/2021   CO2 25 05/25/2021   Lab Results  Component Value Date   ALT 30 03/05/2021   AST 19 03/05/2021   ALKPHOS 58 03/05/2021   BILITOT 0.2 03/05/2021   Lab Results  Component Value Date   HGBA1C 6.2 (H) 02/10/2021   HGBA1C 6.3 (H) 09/11/2020   HGBA1C 6.3 (H) 05/29/2020   HGBA1C 6.4 (H) 01/10/2020   HGBA1C 6.2 07/07/2019   Lab Results  Component Value Date   INSULIN 22.6 02/10/2021   INSULIN 20.9 05/29/2020   INSULIN 24.0 01/10/2020   INSULIN 21.9 11/17/2018   INSULIN 24.0 08/18/2018   Lab Results  Component Value Date   TSH 1.600 05/25/2021   Lab Results  Component Value Date   CHOL 157 02/10/2021   HDL 41 02/10/2021   LDLCALC 79 02/10/2021   LDLDIRECT 136.0 11/16/2016   TRIG 222 (H) 02/10/2021   CHOLHDL 4 07/07/2019   Lab Results  Component Value Date   VD25OH 47.1 02/10/2021   VD25OH 57.5 09/18/2020   VD25OH 43.6 05/29/2020   Lab Results  Component Value Date   WBC 10.4 05/25/2021   HGB 15.2 05/25/2021   HCT 44.5 05/25/2021   MCV 84.4 05/25/2021   PLT 325 05/25/2021   No results found for: IRON, TIBC, FERRITIN  Attestation Statements:   Reviewed by clinician on day of visit: allergies, medications, problem list, medical history, surgical history, family history, social history, and previous encounter notes.   I, Trixie Dredge, am acting as transcriptionist for Dennard Nip, MD.  I have reviewed the above documentation for accuracy and completeness, and I agree with the above. -  Dennard Nip, MD

## 2021-06-18 ENCOUNTER — Ambulatory Visit (HOSPITAL_COMMUNITY)
Admission: RE | Admit: 2021-06-18 | Discharge: 2021-06-18 | Disposition: A | Discharge status: Home / Self Care | Payer: BC Managed Care – PPO | Source: Ambulatory Visit | Attending: Internal Medicine | Admitting: Internal Medicine

## 2021-06-18 ENCOUNTER — Other Ambulatory Visit: Payer: Self-pay

## 2021-06-18 ENCOUNTER — Other Ambulatory Visit (INDEPENDENT_AMBULATORY_CARE_PROVIDER_SITE_OTHER): Payer: Self-pay | Admitting: Family Medicine

## 2021-06-18 DIAGNOSIS — E785 Hyperlipidemia, unspecified: Secondary | ICD-10-CM | POA: Diagnosis not present

## 2021-06-18 DIAGNOSIS — I4891 Unspecified atrial fibrillation: Secondary | ICD-10-CM | POA: Diagnosis not present

## 2021-06-18 DIAGNOSIS — G4730 Sleep apnea, unspecified: Secondary | ICD-10-CM | POA: Diagnosis not present

## 2021-06-18 DIAGNOSIS — I4892 Unspecified atrial flutter: Secondary | ICD-10-CM | POA: Diagnosis not present

## 2021-06-18 DIAGNOSIS — I10 Essential (primary) hypertension: Secondary | ICD-10-CM

## 2021-06-18 LAB — ECHOCARDIOGRAM COMPLETE
Area-P 1/2: 3.26 cm2
S' Lateral: 3.4 cm
Single Plane A4C EF: 58.7 %

## 2021-06-18 NOTE — Progress Notes (Signed)
  Echocardiogram 2D Echocardiogram has been performed.  Michiel Cowboy 06/18/2021, 9:36 AM

## 2021-06-18 NOTE — Telephone Encounter (Signed)
LAST APPOINTMENT DATE: 06/10/2021  NEXT APPOINTMENT DATE: 06/25/2021   [id number] [address], [address] AT [facility] OF [address] [place] [address] Phone: [phone] Fax: [phone]   Patient is requesting a refill of the following medications: Requested Prescriptions   Pending Prescriptions Disp Refills   hydrochlorothiazide (HYDRODIURIL) 25 MG tablet [Pharmacy Med Name: HYDROCHLOROTHIAZIDE 25MG  TABLETS] 90 tablet     Sig: TAKE 1 TABLET(25 MG) BY MOUTH DAILY    Date last filled: 06/10/21 Previously prescribed by [facility]  Lab Results  Component Value Date   HGBA1C 6.2 (H) 02/10/2021   HGBA1C 6.3 (H) 09/11/2020   HGBA1C 6.3 (H) 05/29/2020   Lab Results  Component Value Date   LDLCALC 79 02/10/2021   CREATININE 0.99 05/25/2021   Lab Results  Component Value Date   VD25OH 47.1 02/10/2021   VD25OH 57.5 09/18/2020   VD25OH 43.6 05/29/2020   BP Readings from Last 3 Encounters:  06/10/21 132/71  05/30/21 120/76  05/25/21 104/65

## 2021-06-18 NOTE — Telephone Encounter (Signed)
Last OV with Dr. Beasley 

## 2021-06-23 ENCOUNTER — Other Ambulatory Visit: Payer: Self-pay | Admitting: Emergency Medicine

## 2021-06-25 ENCOUNTER — Other Ambulatory Visit: Payer: Self-pay

## 2021-06-25 ENCOUNTER — Encounter (INDEPENDENT_AMBULATORY_CARE_PROVIDER_SITE_OTHER): Payer: Self-pay | Admitting: Family Medicine

## 2021-06-25 ENCOUNTER — Ambulatory Visit (INDEPENDENT_AMBULATORY_CARE_PROVIDER_SITE_OTHER): Payer: BC Managed Care – PPO | Admitting: Family Medicine

## 2021-06-25 ENCOUNTER — Other Ambulatory Visit (HOSPITAL_BASED_OUTPATIENT_CLINIC_OR_DEPARTMENT_OTHER): Payer: Self-pay

## 2021-06-25 VITALS — BP 112/71 | HR 81 | Temp 98.0°F | Ht 71.0 in | Wt 312.0 lb

## 2021-06-25 DIAGNOSIS — F3289 Other specified depressive episodes: Secondary | ICD-10-CM

## 2021-06-25 DIAGNOSIS — I10 Essential (primary) hypertension: Secondary | ICD-10-CM | POA: Diagnosis not present

## 2021-06-25 DIAGNOSIS — Z9189 Other specified personal risk factors, not elsewhere classified: Secondary | ICD-10-CM | POA: Diagnosis not present

## 2021-06-25 DIAGNOSIS — I5189 Other ill-defined heart diseases: Secondary | ICD-10-CM

## 2021-06-25 DIAGNOSIS — Z6841 Body Mass Index (BMI) 40.0 and over, adult: Secondary | ICD-10-CM

## 2021-06-25 MED ORDER — APIXABAN 5 MG PO TABS
5.0000 mg | ORAL_TABLET | Freq: Two times a day (BID) | ORAL | 0 refills | Status: DC
  Filled 2021-06-25 – 2021-06-27 (×3): qty 60, 30d supply, fill #0

## 2021-06-25 MED ORDER — LOSARTAN POTASSIUM 50 MG PO TABS: 50.0000 mg | ORAL_TABLET | Freq: Every day | ORAL | 0 refills | Status: DC

## 2021-06-25 MED ORDER — BUPROPION HCL ER (SR) 150 MG PO TB12: 150.0000 mg | ORAL_TABLET | Freq: Every morning | ORAL | 0 refills | Status: DC

## 2021-06-27 ENCOUNTER — Other Ambulatory Visit (HOSPITAL_BASED_OUTPATIENT_CLINIC_OR_DEPARTMENT_OTHER): Payer: Self-pay

## 2021-06-30 ENCOUNTER — Other Ambulatory Visit (HOSPITAL_BASED_OUTPATIENT_CLINIC_OR_DEPARTMENT_OTHER): Payer: Self-pay

## 2021-06-30 ENCOUNTER — Encounter (HOSPITAL_COMMUNITY): Payer: Self-pay | Admitting: Physician Assistant

## 2021-06-30 ENCOUNTER — Other Ambulatory Visit: Payer: Self-pay

## 2021-06-30 ENCOUNTER — Ambulatory Visit (HOSPITAL_COMMUNITY)
Admission: RE | Admit: 2021-06-30 | Discharge: 2021-06-30 | Disposition: A | Discharge status: Home / Self Care | Payer: BC Managed Care – PPO | Source: Ambulatory Visit | Attending: Physician Assistant | Admitting: Physician Assistant

## 2021-06-30 VITALS — BP 130/68 | HR 81 | Ht 71.0 in | Wt 322.0 lb

## 2021-06-30 DIAGNOSIS — Z79899 Other long term (current) drug therapy: Secondary | ICD-10-CM | POA: Insufficient documentation

## 2021-06-30 DIAGNOSIS — G4733 Obstructive sleep apnea (adult) (pediatric): Secondary | ICD-10-CM | POA: Insufficient documentation

## 2021-06-30 DIAGNOSIS — I4892 Unspecified atrial flutter: Secondary | ICD-10-CM

## 2021-06-30 DIAGNOSIS — I10 Essential (primary) hypertension: Secondary | ICD-10-CM | POA: Diagnosis not present

## 2021-06-30 DIAGNOSIS — E669 Obesity, unspecified: Secondary | ICD-10-CM | POA: Diagnosis not present

## 2021-06-30 DIAGNOSIS — Z8249 Family history of ischemic heart disease and other diseases of the circulatory system: Secondary | ICD-10-CM | POA: Insufficient documentation

## 2021-06-30 DIAGNOSIS — Z7901 Long term (current) use of anticoagulants: Secondary | ICD-10-CM | POA: Diagnosis not present

## 2021-06-30 DIAGNOSIS — Z713 Dietary counseling and surveillance: Secondary | ICD-10-CM | POA: Diagnosis not present

## 2021-06-30 DIAGNOSIS — Z6841 Body Mass Index (BMI) 40.0 and over, adult: Secondary | ICD-10-CM | POA: Insufficient documentation

## 2021-06-30 NOTE — Progress Notes (Signed)
Primary Care Physician: Colon Branch, MD Primary Cardiologist: none Primary Electrophysiologist: none Referring Physician: [place]   Bryan [name] is a 60 y.o. male with a history of HTN, OSA, atrial flutter who presents for follow up in the [facility].  The patient was initially diagnosed with atrial flutter 05/25/21 after presenting to the ED with heart racing noted on his smart watch. He reports that he felt "sluggish" but this was mild. He would not have presented to the ED if his watch had not shown a heart rate of 140-150 bpm. ECG at the ED showed atrial flutter with 2:1 conduction and he underwent DCCV. Patient is on Eliquis for a CHADS2VASC score of 1.   On follow up today, patient reports that he has done well since his last visit. He denies any heart racing or palpitations. He denies any bleeding issues on anticoagulation. He had an echo which showed normal EF.   Today, he denies symptoms of palpitations, chest pain, shortness of breath, orthopnea, PND, lower extremity edema, dizziness, presyncope, syncope, bleeding, or neurologic sequela. The patient is tolerating medications without difficulties and is otherwise without complaint today.    Atrial Fibrillation Risk Factors:  he does have symptoms or diagnosis of sleep apnea. he is compliant with CPAP therapy. he does not have a history of rheumatic fever. he does have a history of alcohol use. 6-7 drinks weekly  The patient does have a history of early familial atrial fibrillation or other arrhythmias. Mother has afib.  he has a BMI of Body mass index is 44.91 kg/m.[name] Filed Weights   06/30/21 0903  Weight: (!) 146.1 kg     Family History  Problem Relation Age of Onset   Ovarian cancer Mother    Cancer Mother        ovarian   Hypertension Mother    Hyperlipidemia Mother    Obesity Mother    Cancer Father 4       renal   Kidney disease Father    Sleep apnea Father    Cancer  Brother        leukemia   Diabetes Brother    Colon cancer Maternal Aunt 42   Cancer Paternal Grandmother        breast   Prostate cancer Neg Hx    Esophageal cancer Neg Hx    Rectal cancer Neg Hx    Stomach cancer Neg Hx    CAD Neg Hx    Colon polyps Neg Hx      Atrial Fibrillation Management history:  Previous antiarrhythmic drugs: none Previous cardioversions: 05/25/21 Previous ablations: none CHADS2VASC score: 1 Anticoagulation history: Eliquis   Past Medical History:  Diagnosis Date   Allergy    Dental crowns present    Hyperlipidemia    Lower back pain    Nephrolithiasis 2017   Prediabetes 09/04/2014   Seasonal allergies    Sebaceous cyst 04/2013   upper back   Sleep apnea    uses CPAP nightly   Urolithiasis 10/2016   Past Surgical History:  Procedure Laterality Date   ADENOIDECTOMY     as a child   bone marrow donor     under anesthesia   CHOLECYSTECTOMY  01/12/2012   Procedure: LAPAROSCOPIC CHOLECYSTECTOMY WITH INTRAOPERATIVE CHOLANGIOGRAM;  Surgeon: Imogene Burn. [name], MD;  Location: [facility];  Service: General;  Laterality: N/A;   COLONOSCOPY     CYST EXCISION     cyst removal from  back   EAR CYST EXCISION N/A 05/04/2013   Procedure: Excision subcutaneous mass posterior neck;  Surgeon: [name], MD;  Location: [facility];  Service: General;  Laterality: N/A;   POLYPECTOMY     wisdom teeth      Current Outpatient Medications  Medication Sig Dispense Refill   atorvastatin (LIPITOR) 20 MG tablet Take 1 tablet (20 mg total) by mouth at bedtime. 90 tablet 3   azelastine (ASTELIN) 0.1 % nasal spray Place 2 sprays into both nostrils at bedtime as needed for rhinitis. Use in each nostril as directed 30 mL 5   buPROPion (WELLBUTRIN SR) 150 MG 12 hr tablet Take 1 tablet (150 mg total) by mouth in the morning. 30 tablet 0   CO ENZYME Q-10 PO Take by mouth daily.     diltiazem (CARDIZEM) 30 MG tablet Take 1 - 2 tablets by mouth every 6  hours as needed for heart rate greater than 130 30 tablet 0   fish oil-omega-3 fatty acids 1000 MG capsule Take 1 g by mouth daily.     hydrochlorothiazide (HYDRODIURIL) 25 MG tablet Take 1 tablet (25 mg total) by mouth daily. 30 tablet 0   Insulin Pen Needle (BD PEN NEEDLE NANO U/F) 32G X 4 MM MISC 1 each by Does not apply route daily. Use one pen needle once daily to inject Saxenda. 100 each 0   Liraglutide -Weight Management (SAXENDA) 18 MG/3ML SOPN Inject 3 mg into the skin daily. 15 mL 0   losartan (COZAAR) 50 MG tablet Take 1 tablet (50 mg total) by mouth daily. 30 tablet 0   Melatonin 1 MG CAPS Take 1 mg by mouth at bedtime as needed.      metFORMIN (GLUCOPHAGE) 500 MG tablet TAKE 1 TABLET(500 MG) BY MOUTH THREE TIMES DAILY 90 tablet 0   Multiple Vitamins-Minerals (CENTRUM PO) Take 1 tablet by mouth daily.     Vitamin D, Ergocalciferol, (DRISDOL) 1.25 MG (50000 UNIT) CAPS capsule Take 1 capsule (50,000 Units total) by mouth every 7 (seven) days. 4 capsule 0   No current facility-administered medications for this encounter.    Allergies  Allergen Reactions   Lisinopril Cough    Social History   Socioeconomic History   Marital status: Married    Spouse name: Patty   Number of children: 3   Years of education: Not on file   Highest education level: Not on file  Occupational History   Occupation:  manufactoring rep, A/C units     Employer: [employer]  Tobacco Use   Smoking status: Never   Smokeless tobacco: Never  Vaping Use   Vaping Use: Never used  Substance and Sexual Activity   Alcohol use: Yes    Alcohol/week: 2.0 standard drinks    Types: 2 Glasses of wine per week    Comment: 1 glass wine every other day   Drug use: No   Sexual activity: Not on file  Other Topics Concern   Not on file  Social History Narrative   3 children, 1 at home,  2 in college    Wife w/ breast ca dx 2016, doing well   she developed SZs 2017, sees Dr Jannifer Franklin          Social  Determinants of Health   Financial Resource Strain: Not on file  Food Insecurity: Not on file  Transportation Needs: Not on file  Physical Activity: Not on file  Stress: Not on file  Social Connections:  Not on file  Intimate Partner Violence: Not on file     ROS- All systems are reviewed and negative except as per the HPI above.  Physical Exam: Vitals:   06/30/21 0903  BP: 130/68  Pulse: 81  Weight: (!) 146.1 kg  Height: '5\' 11"'$  (1.803 m)    GEN- The patient is a well appearing obese male, alert and oriented x 3 today.   HEENT-head normocephalic, atraumatic, sclera clear, conjunctiva pink, hearing intact, trachea midline. Lungs- Clear to ausculation bilaterally, normal work of breathing Heart- Regular rate and rhythm, no murmurs, rubs or gallops  GI- soft, NT, ND, + BS Extremities- no clubbing, cyanosis, or edema MS- no significant deformity or atrophy Skin- no rash or lesion Psych- euthymic mood, full affect Neuro- strength and sensation are intact   Wt Readings from Last 3 Encounters:  06/30/21 (!) 146.1 kg  06/25/21 (!) 141.5 kg  06/10/21 (!) 142 kg    EKG today demonstrates  SR, RBBB Vent. rate 81 BPM PR interval 154 ms QRS duration 146 ms QT/QTcB 420/487 ms  Echo 06/18/21  1. Left ventricular ejection fraction, by estimation, is 65 to 70%. The  left ventricle has normal function. The left ventricle has no regional  wall motion abnormalities. Left ventricular diastolic parameters are  consistent with Grade I diastolic dysfunction (impaired relaxation).   2. Right ventricular systolic function is normal. The right ventricular  size is normal. Tricuspid regurgitation signal is inadequate for assessing PA pressure.   3. The mitral valve is grossly normal. No evidence of mitral valve  regurgitation. No evidence of mitral stenosis.   4. The aortic valve is tricuspid. Aortic valve regurgitation is not  visualized. No aortic stenosis is present.   5. The inferior  vena cava is normal in size with greater than 50%  respiratory variability, suggesting right atrial pressure of 3 mmHg.   Comparison(s): A prior study was performed on 09/21/2017. No significant change from prior study. Prior images reviewed side by side.    Epic records are reviewed at length today  CHA2DS2-VASc Score = 1  The patient's score is based upon: CHF History: No HTN History: Yes Diabetes History: No Stroke History: No Vascular Disease History: No Age Score: 0 Gender Score: 0     ASSESSMENT AND PLAN: 1. Atrial flutter The patient's CHA2DS2-VASc score is 1, indicating a 0.6% annual risk of stroke.   Patient appears to be maintaining SR. We discussed his stroke risk and the risks and benefits of anticoagulation. Will stop Eliquis now that he is >4 weeks post DCCV with low CV score. Continue diltiazem 30 mg q 4 hours PRN for heart racing.  2. Obesity Body mass index is 44.91 kg/m. Lifestyle modification was discussed and encouraged including regular physical activity and weight reduction. [facility]  3. Obstructive sleep apnea Patient reports compliance with CPAP therapy.  4. HTN Stable, no changes today.   Follow up in the [facility] clinic in 6 months.    [facility] [address] [place], [place] [address] [phone] 06/30/2021 9:20 AM

## 2021-06-30 NOTE — Patient Instructions (Signed)
Stop eliquis

## 2021-07-02 NOTE — Progress Notes (Signed)
Chief Complaint:   OBESITY Bryan [name] is here to discuss his progress with his obesity treatment plan along with follow-up of his obesity related diagnoses. Bryan [name] is on the Category 3 Plan and states he is following his eating plan approximately 75-80% of the time. Bryan [name] states he is walking and on the rowing machine for 25 minutes 7 times per week.  Today's visit was #: 53 Starting weight: 326 lbs Starting date: 09/07/2017 Today's weight: 312 lbs Today's date: 06/25/2021 Total lbs lost to date: 14 Total lbs lost since last in-office visit: 1  Interim History: Bryan [name] continues to work on diet and weight loss. He is working on following his plan and decreasing snacking in between meals. He is on Saxenda now at 3.0 mg and he is tolerating this well. He is trying to decrease his simple carbohydrates.  Subjective:   1. Essential hypertension Bryan [name]'s blood pressure is well controlled on his diet and medications.  2. Grade I diastolic dysfunction Bryan [name]'s recent echocardiogram was unchanged and showed an EF of 65-70% with mild diastolic dysfunction. He is working on diet and weight loss, and tight blood pressure control. I discussed labs with the patient today.  3. Other depression with emotional eating Bryan [name] is working on [employer] behaviors and he is doing well with his medications. He notes some increased headache recently but this appears to be due to decreased water intake.  4. At risk for heart disease Bryan [name] is at a higher than average risk for cardiovascular disease due to obesity.   Assessment/Plan:   1. Essential hypertension Bryan [name] will continue working on healthy weight loss and exercise to improve blood pressure control. We will watch for signs of hypotension as he continues his lifestyle modifications. We will refill losartan for 1 month.  - losartan (COZAAR) 50 MG tablet; Take 1 tablet (50 mg total) by mouth daily.  Dispense: 30 tablet;  Refill: 0  2. Grade I diastolic dysfunction Bryan [name] was educated on his test results in depth and I encouraged him to continue working on diet, exercise, and weight loss as is.  3. Other depression with emotional eating Behavior modification techniques were discussed today to help Bryan [name] deal with his emotional/non-hunger eating behaviors. We will refill Wellbutrin SR for 1 month. Orders and follow up as documented in patient record.   - buPROPion (WELLBUTRIN SR) 150 MG 12 hr tablet; Take 1 tablet (150 mg total) by mouth in the morning.  Dispense: 30 tablet; Refill: 0  4. At risk for heart disease Bryan [name] was given approximately 15 minutes of coronary artery disease prevention counseling today. He is 60 y.o. male and has risk factors for heart disease including obesity. We discussed intensive lifestyle modifications today with an emphasis on specific weight loss instructions and strategies.   Repetitive spaced learning was employed today to elicit superior memory formation and behavioral change.  5. Obesity with current BMI 43.6 Bryan [name] is currently in the action stage of change. As such, his goal is to continue with weight loss efforts. He has agreed to the Category 3 Plan.   Exercise goals: As is.  Behavioral modification strategies: increasing lean protein intake and meal planning and cooking strategies.  Bryan [name] has agreed to follow-up with our clinic in 2 weeks. He was informed of the importance of frequent follow-up visits to maximize his success with intensive lifestyle modifications for his multiple health conditions.   Objective:   Blood pressure 112/71, pulse 81, temperature 98 F (36.7 C), height  $'5\' 11"'W$  (1.803 m), weight (!) 312 lb (141.5 kg), SpO2 97 %. Body mass index is 43.52 kg/m.  General: Cooperative, alert, well developed, in no acute distress. HEENT: Conjunctivae and lids unremarkable. Cardiovascular: Regular rhythm.  Lungs: Normal work of breathing. Neurologic:  No focal deficits.   Lab Results  Component Value Date   CREATININE 0.99 05/25/2021   BUN 20 05/25/2021   NA 136 05/25/2021   K 3.2 (L) 05/25/2021   CL 102 05/25/2021   CO2 25 05/25/2021   Lab Results  Component Value Date   ALT 30 03/05/2021   AST 19 03/05/2021   ALKPHOS 58 03/05/2021   BILITOT 0.2 03/05/2021   Lab Results  Component Value Date   HGBA1C 6.2 (H) 02/10/2021   HGBA1C 6.3 (H) 09/11/2020   HGBA1C 6.3 (H) 05/29/2020   HGBA1C 6.4 (H) 01/10/2020   HGBA1C 6.2 07/07/2019   Lab Results  Component Value Date   INSULIN 22.6 02/10/2021   INSULIN 20.9 05/29/2020   INSULIN 24.0 01/10/2020   INSULIN 21.9 11/17/2018   INSULIN 24.0 08/18/2018   Lab Results  Component Value Date   TSH 1.600 05/25/2021   Lab Results  Component Value Date   CHOL 157 02/10/2021   HDL 41 02/10/2021   LDLCALC 79 02/10/2021   LDLDIRECT 136.0 11/16/2016   TRIG 222 (H) 02/10/2021   CHOLHDL 4 07/07/2019   Lab Results  Component Value Date   VD25OH 47.1 02/10/2021   VD25OH 57.5 09/18/2020   VD25OH 43.6 05/29/2020   Lab Results  Component Value Date   WBC 10.4 05/25/2021   HGB 15.2 05/25/2021   HCT 44.5 05/25/2021   MCV 84.4 05/25/2021   PLT 325 05/25/2021   No results found for: IRON, TIBC, FERRITIN  Attestation Statements:   Reviewed by clinician on day of visit: allergies, medications, problem list, medical history, surgical history, family history, social history, and previous encounter notes.   I, Trixie Dredge, am acting as transcriptionist for Dennard Nip, MD.  I have reviewed the above documentation for accuracy and completeness, and I agree with the above. -  Dennard Nip, MD

## 2021-07-09 ENCOUNTER — Other Ambulatory Visit: Payer: Self-pay

## 2021-07-09 ENCOUNTER — Encounter (INDEPENDENT_AMBULATORY_CARE_PROVIDER_SITE_OTHER): Payer: Self-pay | Admitting: Family Medicine

## 2021-07-09 ENCOUNTER — Ambulatory Visit (INDEPENDENT_AMBULATORY_CARE_PROVIDER_SITE_OTHER): Payer: BC Managed Care – PPO | Admitting: Family Medicine

## 2021-07-09 VITALS — BP 124/72 | HR 85 | Temp 97.8°F | Ht 71.0 in | Wt 309.0 lb

## 2021-07-09 DIAGNOSIS — E559 Vitamin D deficiency, unspecified: Secondary | ICD-10-CM | POA: Diagnosis not present

## 2021-07-09 DIAGNOSIS — I10 Essential (primary) hypertension: Secondary | ICD-10-CM | POA: Diagnosis not present

## 2021-07-09 DIAGNOSIS — Z6841 Body Mass Index (BMI) 40.0 and over, adult: Secondary | ICD-10-CM

## 2021-07-09 DIAGNOSIS — R7303 Prediabetes: Secondary | ICD-10-CM | POA: Diagnosis not present

## 2021-07-09 DIAGNOSIS — Z9189 Other specified personal risk factors, not elsewhere classified: Secondary | ICD-10-CM

## 2021-07-09 MED ORDER — METFORMIN HCL 500 MG PO TABS: ORAL_TABLET | ORAL | 0 refills | Status: DC

## 2021-07-09 MED ORDER — VITAMIN D (ERGOCALCIFEROL) 1.25 MG (50000 UNIT) PO CAPS: 50000.0000 [IU] | ORAL_CAPSULE | ORAL | 0 refills | Status: DC

## 2021-07-09 MED ORDER — LOSARTAN POTASSIUM 50 MG PO TABS: 50.0000 mg | ORAL_TABLET | Freq: Every day | ORAL | 0 refills | Status: DC

## 2021-07-09 MED ORDER — SAXENDA 18 MG/3ML ~~LOC~~ SOPN: 3.0000 mg | PEN_INJECTOR | Freq: Every day | SUBCUTANEOUS | 0 refills | Status: DC

## 2021-07-14 NOTE — Progress Notes (Signed)
Chief Complaint:   OBESITY Bryan [name] is here to discuss his progress with his obesity treatment plan along with follow-up of his obesity related diagnoses. Bryan [name] is on the Category 3 Plan and states he is following his eating plan approximately 85% of the time. Bryan [name] states he is on the treadmill and rower for 25-45 minutes 7 times per week.  Today's visit was #: 28 Starting weight: 326 lbs Starting date: 09/07/2017 Today's weight: 309 lbs Today's date: 07/09/2021 Total lbs lost to date: 17 Total lbs lost since last in-office visit: 3  Interim History: [place] continues to do well with weight loss. He has increased his exercise and he is feeling well overall. He is doing well on Saxenda and his hunger is controlled.  Subjective:   1. Essential hypertension Bryan [name]'s blood pressure is well controlled with his medications and weight loss.  2. Pre-diabetes Bryan [name] is stable on his medications and with diet.  3. Vitamin D deficiency Bryan [name] is stable on Vit D, and he has no signs of over-replacement.  4. At risk for dehydration Bryan [name] is at risk for dehydration due to inadequate water intake.  Assessment/Plan:   1. Essential hypertension We will refill losartan for 1 month. Kas[place]yn will continue with diet and exercise to improve blood pressure control. We will watch for signs of hypotension as he continues his lifestyle modifications.  - losartan (COZAAR) 50 MG tablet; Take 1 tablet (50 mg total) by mouth daily.  Dispense: 30 tablet; Refill: 0  2. Pre-diabetes [place] will continue to work on weight loss, exercise, and decreasing simple carbohydrates to help decrease the risk of diabetes. We will refill metformin for 1 month.  - metFORMIN (GLUCOPHAGE) 500 MG tablet; TAKE 1 TABLET(500 MG) BY MOUTH THREE TIMES DAILY  Dispense: 90 tablet; Refill: 0  3. Vitamin D deficiency Low Vitamin D level contributes to fatigue and are associated with obesity, breast, and colon cancer.  We will refill prescription Vitamin D for 1 month. Bryan [name] will follow-up for routine testing of Vitamin D, at least 2-3 times per year to avoid over-replacement.  - Vitamin D, Ergocalciferol, (DRISDOL) 1.25 MG (50000 UNIT) CAPS capsule; Take 1 capsule (50,000 Units total) by mouth every 7 (seven) days.  Dispense: 4 capsule; Refill: 0  4. At risk for dehydration Bryan [name] was given approximately 15 minutes dehydration prevention counseling today. Bryan [name] is at risk for dehydration due to weight loss and current medication(s). He was encouraged to hydrate and monitor fluid status to avoid dehydration as well as weight loss plateaus.   5. Obesity with current BMI 43.1 Bryan [name] is currently in the action stage of change. As such, his goal is to continue with weight loss efforts. He has agreed to the Category 3 Plan.   We discussed various medication options to help Bryan [name] with his weight loss efforts and we both agreed to continue Saxenda, and we will refill for 1 month.  - Liraglutide -Weight Management (SAXENDA) 18 MG/3ML SOPN; Inject 3 mg into the skin daily.  Dispense: 15 mL; Refill: 0  Exercise goals: As is.  Behavioral modification strategies: increasing lean protein intake.  Bryan [name] has agreed to follow-up with our clinic in 3 weeks. He was informed of the importance of frequent follow-up visits to maximize his success with intensive lifestyle modifications for his multiple health conditions.   Objective:   Blood pressure 124/72, pulse 85, temperature 97.8 F (36.6 C), height '5\' 11"'$  (1.803 m), weight (!) 309 lb (140.2 kg), SpO2 98 %.  Body mass index is 43.1 kg/m.  General: Cooperative, alert, well developed, in no acute distress. HEENT: Conjunctivae and lids unremarkable. Cardiovascular: Regular rhythm.  Lungs: Normal work of breathing. Neurologic: No focal deficits.   Lab Results  Component Value Date   CREATININE 0.99 05/25/2021   BUN 20 05/25/2021   NA 136 05/25/2021   K  3.2 (L) 05/25/2021   CL 102 05/25/2021   CO2 25 05/25/2021   Lab Results  Component Value Date   ALT 30 03/05/2021   AST 19 03/05/2021   ALKPHOS 58 03/05/2021   BILITOT 0.2 03/05/2021   Lab Results  Component Value Date   HGBA1C 6.2 (H) 02/10/2021   HGBA1C 6.3 (H) 09/11/2020   HGBA1C 6.3 (H) 05/29/2020   HGBA1C 6.4 (H) 01/10/2020   HGBA1C 6.2 07/07/2019   Lab Results  Component Value Date   INSULIN 22.6 02/10/2021   INSULIN 20.9 05/29/2020   INSULIN 24.0 01/10/2020   INSULIN 21.9 11/17/2018   INSULIN 24.0 08/18/2018   Lab Results  Component Value Date   TSH 1.600 05/25/2021   Lab Results  Component Value Date   CHOL 157 02/10/2021   HDL 41 02/10/2021   LDLCALC 79 02/10/2021   LDLDIRECT 136.0 11/16/2016   TRIG 222 (H) 02/10/2021   CHOLHDL 4 07/07/2019   Lab Results  Component Value Date   VD25OH 47.1 02/10/2021   VD25OH 57.5 09/18/2020   VD25OH 43.6 05/29/2020   Lab Results  Component Value Date   WBC 10.4 05/25/2021   HGB 15.2 05/25/2021   HCT 44.5 05/25/2021   MCV 84.4 05/25/2021   PLT 325 05/25/2021   No results found for: IRON, TIBC, FERRITIN  Attestation Statements:   Reviewed by clinician on day of visit: allergies, medications, problem list, medical history, surgical history, family history, social history, and previous encounter notes.   I, Trixie Dredge, am acting as transcriptionist for Dennard Nip, MD.  I have reviewed the above documentation for accuracy and completeness, and I agree with the above. -  Dennard Nip, MD

## 2021-07-27 ENCOUNTER — Other Ambulatory Visit (INDEPENDENT_AMBULATORY_CARE_PROVIDER_SITE_OTHER): Payer: Self-pay | Admitting: Family Medicine

## 2021-07-27 DIAGNOSIS — F3289 Other specified depressive episodes: Secondary | ICD-10-CM

## 2021-07-27 DIAGNOSIS — Z6841 Body Mass Index (BMI) 40.0 and over, adult: Secondary | ICD-10-CM

## 2021-07-27 DIAGNOSIS — R7303 Prediabetes: Secondary | ICD-10-CM

## 2021-07-28 NOTE — Telephone Encounter (Signed)
Last OV with Dr. Beasley 

## 2021-07-28 NOTE — Telephone Encounter (Signed)
Left a msg to sent me a mychart msg if patient nee refill on Bupropion, metformin, and B&D pen needles before his scheduled appt on 07/30/21. If no then we will refill these at his next appt

## 2021-07-30 ENCOUNTER — Encounter (INDEPENDENT_AMBULATORY_CARE_PROVIDER_SITE_OTHER): Payer: Self-pay | Admitting: Family Medicine

## 2021-07-30 ENCOUNTER — Other Ambulatory Visit: Payer: Self-pay

## 2021-07-30 ENCOUNTER — Ambulatory Visit (INDEPENDENT_AMBULATORY_CARE_PROVIDER_SITE_OTHER): Payer: BC Managed Care – PPO | Admitting: Family Medicine

## 2021-07-30 VITALS — BP 114/75 | HR 82 | Temp 98.1°F | Ht 71.0 in | Wt 306.0 lb

## 2021-07-30 DIAGNOSIS — E7849 Other hyperlipidemia: Secondary | ICD-10-CM

## 2021-07-30 DIAGNOSIS — I10 Essential (primary) hypertension: Secondary | ICD-10-CM | POA: Diagnosis not present

## 2021-07-30 DIAGNOSIS — F3289 Other specified depressive episodes: Secondary | ICD-10-CM | POA: Diagnosis not present

## 2021-07-30 DIAGNOSIS — Z9189 Other specified personal risk factors, not elsewhere classified: Secondary | ICD-10-CM

## 2021-07-30 DIAGNOSIS — Z6841 Body Mass Index (BMI) 40.0 and over, adult: Secondary | ICD-10-CM

## 2021-07-30 DIAGNOSIS — R7303 Prediabetes: Secondary | ICD-10-CM

## 2021-07-30 MED ORDER — BD PEN NEEDLE NANO U/F 32G X 4 MM MISC: 1.0000 | Freq: Every day | 0 refills | Status: DC

## 2021-07-30 MED ORDER — BUPROPION HCL ER (SR) 150 MG PO TB12: 150.0000 mg | ORAL_TABLET | Freq: Every morning | ORAL | 0 refills | Status: DC

## 2021-07-30 MED ORDER — ATORVASTATIN CALCIUM 10 MG PO TABS: 10.0000 mg | ORAL_TABLET | Freq: Every day | ORAL | 0 refills | Status: DC

## 2021-07-30 MED ORDER — METFORMIN HCL 500 MG PO TABS: ORAL_TABLET | ORAL | 0 refills | Status: DC

## 2021-07-30 MED ORDER — ATORVASTATIN CALCIUM 20 MG PO TABS: 20.0000 mg | ORAL_TABLET | Freq: Every day | ORAL | 3 refills | Status: DC

## 2021-07-30 MED ORDER — LOSARTAN POTASSIUM 50 MG PO TABS: 50.0000 mg | ORAL_TABLET | Freq: Every day | ORAL | 0 refills | Status: DC

## 2021-07-30 MED ORDER — SAXENDA 18 MG/3ML ~~LOC~~ SOPN: 3.0000 mg | PEN_INJECTOR | Freq: Every day | SUBCUTANEOUS | 0 refills | Status: DC

## 2021-07-31 NOTE — Progress Notes (Signed)
Chief Complaint:   OBESITY Bryan [name] is here to discuss his progress with his obesity treatment plan along with follow-up of his obesity related diagnoses. Bryan [name] is on the Category 3 Plan and states he is following his eating plan approximately 75-80% of the time. Bryan [name] states he is on thr treadmill and rowing machine for 25 minutes 7 times per week.  Today's visit was #: 39 Starting weight: 326 lbs Starting date: 09/07/2017 Today's weight: 306 lbs Today's date: 07/30/2021 Total lbs lost to date: 20 Total lbs lost since last in-office visit: 3  Interim History: Bryan [name] continues to do well with weight loss. He continues to work on exercise and no longer dreads working out. His hunger is mostly controlled and he doesn't feel deprived.  Subjective:   1. Pre-diabetes Bryan [name] continue to do well with weight loss. He is doing well on metformin and GLP-1.  2. Essential hypertension Bryan [name]'s blood pressure is well controlled on his medications and with weight loss.  3. Other hyperlipidemia Bryan [name] is stable on Lipitor and he is working on decreasing cholesterol in his diet.  4. Other depression with emotional eating Bryan [name] notes decreased emotional eating behaviors, and he feels more in control of his cravings.  5. At risk for diabetes mellitus Bryan [name] is at higher than average risk for developing diabetes due to obesity.   Assessment/Plan:   1. Pre-diabetes Bryan [name] will continue to work on weight loss, exercise, and decreasing simple carbohydrates to help decrease the risk of diabetes. We will refill metformin for 1 month.  - metFORMIN (GLUCOPHAGE) 500 MG tablet; TAKE 1 TABLET(500 MG) BY MOUTH THREE TIMES DAILY  Dispense: 90 tablet; Refill: 0  2. Essential hypertension Bryan [name] will continue working on healthy weight loss and exercise to improve blood pressure control. We will refill losartan for 1 month, and will watch for signs of hypotension as he continues his lifestyle  modifications.  - losartan (COZAAR) 50 MG tablet; Take 1 tablet (50 mg total) by mouth daily.  Dispense: 30 tablet; Refill: 0  3. Other hyperlipidemia Bryan [name]. We discussed several lifestyle modifications today. We will refill Lipitor for 1 month. Bryan [name] will continue to work on diet, exercise and weight loss efforts. Orders and follow up as documented in patient record.   - atorvastatin (LIPITOR) 10 MG tablet; Take 1 tablet (10 mg total) by mouth daily.  Dispense: 30 tablet; Refill: 0  4. Other depression with emotional eating Behavior modification techniques were discussed today to help Bryan [name] deal with his emotional/non-hunger eating behaviors. We will refill Wellbutrin SR for 1 month. Orders and follow up as documented in patient record.   - buPROPion (WELLBUTRIN SR) 150 MG 12 hr tablet; Take 1 tablet (150 mg total) by mouth in the morning.  Dispense: 30 tablet; Refill: 0  5. At risk for diabetes mellitus Bryan [name] was given approximately 15 minutes of diabetes education and counseling today. We discussed intensive lifestyle modifications today with an emphasis on weight loss as well as increasing exercise and decreasing simple carbohydrates in his diet. We also [name] medication options with an emphasis on risk versus benefit of those discussed.   Repetitive spaced learning was employed today to elicit superior memory formation and behavioral change.  6. Obesity with current BMI 42.7 Bryan [name] is currently in the action stage of change. As such, his goal is to continue with weight loss efforts. He has agreed to the Category 3 Plan.   We discussed various medication  options to help Bryan [name] with his weight loss efforts and we both agreed to continue taking Saxenda 3.0 mg and we will refill for 1 month, and we will refill nano needles #100 with no refills.  - Liraglutide -Weight Management (SAXENDA) 18 MG/3ML SOPN; Inject 3 mg into the skin  daily.  Dispense: 15 mL; Refill: 0 - Insulin Pen Needle (BD PEN NEEDLE NANO U/F) 32G X 4 MM MISC; 1 each by Does not apply route daily. Use one pen needle once daily to inject Saxenda.  Dispense: 100 each; Refill: 0  Exercise goals: As is.  Behavioral modification strategies: increasing lean protein intake and meal planning and cooking strategies.  Bryan [name] has agreed to follow-up with our clinic in 3 weeks. He was informed of the importance of frequent follow-up visits to maximize his success with intensive lifestyle modifications for his multiple health conditions.   Objective:   Blood pressure 114/75, pulse 82, temperature 98.1 F (36.7 C), height '5\' 11"'$  (1.803 m), weight (!) 306 lb (138.8 kg), SpO2 97 %. Body mass index is 42.68 kg/m.  General: Cooperative, alert, well developed, in no acute distress. HEENT: Conjunctivae and lids unremarkable. Bryan: Regular rhythm.  Lungs: Normal work of breathing. Neurologic: No focal deficits.   Lab Results  Component Value Date   CREATININE 0.99 05/25/2021   BUN 20 05/25/2021   NA 136 05/25/2021   K 3.2 (L) 05/25/2021   CL 102 05/25/2021   CO2 25 05/25/2021   Lab Results  Component Value Date   ALT 30 03/05/2021   AST 19 03/05/2021   ALKPHOS 58 03/05/2021   BILITOT 0.2 03/05/2021   Lab Results  Component Value Date   HGBA1C 6.2 (H) 02/10/2021   HGBA1C 6.3 (H) 09/11/2020   HGBA1C 6.3 (H) 05/29/2020   HGBA1C 6.4 (H) 01/10/2020   HGBA1C 6.2 07/07/2019   Lab Results  Component Value Date   INSULIN 22.6 02/10/2021   INSULIN 20.9 05/29/2020   INSULIN 24.0 01/10/2020   INSULIN 21.9 11/17/2018   INSULIN 24.0 08/18/2018   Lab Results  Component Value Date   TSH 1.600 05/25/2021   Lab Results  Component Value Date   CHOL 157 02/10/2021   HDL 41 02/10/2021   LDLCALC 79 02/10/2021   LDLDIRECT 136.0 11/16/2016   TRIG 222 (H) 02/10/2021   CHOLHDL 4 07/07/2019   Lab Results  Component Value Date   VD25OH 47.1  02/10/2021   VD25OH 57.5 09/18/2020   VD25OH 43.6 05/29/2020   Lab Results  Component Value Date   WBC 10.4 05/25/2021   HGB 15.2 05/25/2021   HCT 44.5 05/25/2021   MCV 84.4 05/25/2021   PLT 325 05/25/2021   No results found for: IRON, TIBC, FERRITIN  Attestation Statements:   [name] by clinician on day of visit: allergies, medications, problem list, medical history, surgical history, family history, social history, and previous encounter notes.   I, Bryan [name], am acting as transcriptionist for Dennard Nip, MD.  I have [name] the above documentation for accuracy and completeness, and I agree with the above. -  Dennard Nip, MD

## 2021-08-08 ENCOUNTER — Other Ambulatory Visit (INDEPENDENT_AMBULATORY_CARE_PROVIDER_SITE_OTHER): Payer: Self-pay | Admitting: Family Medicine

## 2021-08-08 DIAGNOSIS — R7303 Prediabetes: Secondary | ICD-10-CM

## 2021-08-12 NOTE — Telephone Encounter (Signed)
Dr.Beasley 

## 2021-08-20 ENCOUNTER — Ambulatory Visit (INDEPENDENT_AMBULATORY_CARE_PROVIDER_SITE_OTHER): Payer: BC Managed Care – PPO | Admitting: Family Medicine

## 2021-08-20 ENCOUNTER — Encounter (INDEPENDENT_AMBULATORY_CARE_PROVIDER_SITE_OTHER): Payer: Self-pay | Admitting: Family Medicine

## 2021-08-20 ENCOUNTER — Other Ambulatory Visit: Payer: Self-pay

## 2021-08-20 VITALS — BP 145/76 | HR 80 | Temp 98.0°F | Ht 71.0 in | Wt 305.0 lb

## 2021-08-20 DIAGNOSIS — Z9189 Other specified personal risk factors, not elsewhere classified: Secondary | ICD-10-CM | POA: Diagnosis not present

## 2021-08-20 DIAGNOSIS — I10 Essential (primary) hypertension: Secondary | ICD-10-CM | POA: Diagnosis not present

## 2021-08-20 DIAGNOSIS — R7303 Prediabetes: Secondary | ICD-10-CM | POA: Diagnosis not present

## 2021-08-20 DIAGNOSIS — Z6841 Body Mass Index (BMI) 40.0 and over, adult: Secondary | ICD-10-CM

## 2021-08-20 DIAGNOSIS — F3289 Other specified depressive episodes: Secondary | ICD-10-CM

## 2021-08-20 MED ORDER — LOSARTAN POTASSIUM 50 MG PO TABS: 50.0000 mg | ORAL_TABLET | Freq: Every day | ORAL | 0 refills | Status: DC

## 2021-08-20 MED ORDER — TIRZEPATIDE 12.5 MG/0.5ML ~~LOC~~ SOAJ: 12.5000 mg | SUBCUTANEOUS | 0 refills | Status: DC

## 2021-08-20 MED ORDER — BUPROPION HCL ER (SR) 150 MG PO TB12: 150.0000 mg | ORAL_TABLET | Freq: Every morning | ORAL | 0 refills | Status: DC

## 2021-08-21 NOTE — Progress Notes (Signed)
Chief Complaint:   OBESITY Bryan [name] is here to discuss his progress with his obesity treatment plan along with follow-up of his obesity related diagnoses. Bryan [name] is on the Category 3 Plan and states he is following his eating plan approximately 75% of the time. Bryan [name] states he is on the treadmill and rower for 25 minutes 7 times per week.  Today's visit was #: 50 Starting weight: 326 lbs Starting date: 09/07/2017 Today's weight: 305 lbs Today's date: 08/20/2021 Total lbs lost to date: 21 Total lbs lost since last in-office visit: 1  Interim History: Bryan [name] continues to work on diet and exercise. His hunger is controlled. He is tolerating Saxenda well but he still notes some polyphagia.  Subjective:   1. Pre-diabetes Bryan [name] is stable on metformin and Saxenda. He still notes some polyphagia. He is open to looking at other medication options.  2. Essential hypertension Bryan [name]'s blood pressure is elevated today, but normally well controlled with diet and his medications. He denies chest pain.  3. Other depression with emotional eating Bryan [name] is stable on Wellbutrin, and he is dealing with work stress reasonably well. He is mindful of emotional eating behaviors.  4. At risk for heart disease Bryan [name] is at a higher than average risk for cardiovascular disease due to obesity.   Assessment/Plan:   1. Pre-diabetes Bryan [name] agreed to start Mounjaro 12.5 mg q weekly with no refills, and he will continue metformin. He will continue to work on weight loss, exercise, and decreasing simple carbohydrates to help decrease the risk of diabetes.   - tirzepatide (MOUNJARO) 12.5 MG/0.5ML Pen; Inject 12.5 mg into the skin once a week.  Dispense: 2 mL; Refill: 0  2. Essential hypertension Bryan [name] will continue working on healthy weight loss and exercise to improve blood pressure control. We will refill losartan for 1 month. We will recheck his blood pressure in 3 weeks as he continues his  lifestyle modifications.  - losartan (COZAAR) 50 MG tablet; Take 1 tablet (50 mg total) by mouth daily.  Dispense: 30 tablet; Refill: 0  3. Other depression with emotional eating Behavior modification techniques were discussed today to help Bryan [name] deal with his emotional/non-hunger eating behaviors. We will refill Wellbutrin SR for 1 month. He will continue to follow up as directed. Orders and follow up as documented in patient record.   - buPROPion (WELLBUTRIN SR) 150 MG 12 hr tablet; Take 1 tablet (150 mg total) by mouth in the morning.  Dispense: 30 tablet; Refill: 0  4. At risk for heart disease Bryan [name] was given approximately 15 minutes of coronary artery disease prevention counseling today. He is 60 y.o. male and has risk factors for heart disease including obesity. We discussed intensive lifestyle modifications today with an emphasis on specific weight loss instructions and strategies.   Repetitive spaced learning was employed today to elicit superior memory formation and behavioral change.  5. Obesity with current BMI 42.6 Bryan [name] is currently in the action stage of change. As such, his goal is to continue with weight loss efforts. He has agreed to the Category 3 Plan.   We discussed various medication options to help Bryan [name] with his weight loss efforts and we both agreed to [employer], and will start another GLP-1.  Exercise goals: As is.  Behavioral modification strategies: increasing lean protein intake.  Bryan [name] has agreed to follow-up with our clinic in 3 weeks. He was informed of the importance of frequent follow-up visits to maximize his success with intensive lifestyle modifications  for his multiple health conditions.   Objective:   Blood pressure (!) 145/76, pulse 80, temperature 98 F (36.7 C), height '5\' 11"'$  (1.803 m), weight (!) 305 lb (138.3 kg), SpO2 98 %. Body mass index is 42.54 kg/m.  General: Cooperative, alert, well developed, in no acute  distress. HEENT: Conjunctivae and lids unremarkable. Cardiovascular: Regular rhythm.  Lungs: Normal work of breathing. Neurologic: No focal deficits.   Lab Results  Component Value Date   CREATININE 0.99 05/25/2021   BUN 20 05/25/2021   NA 136 05/25/2021   K 3.2 (L) 05/25/2021   CL 102 05/25/2021   CO2 25 05/25/2021   Lab Results  Component Value Date   ALT 30 03/05/2021   AST 19 03/05/2021   ALKPHOS 58 03/05/2021   BILITOT 0.2 03/05/2021   Lab Results  Component Value Date   HGBA1C 6.2 (H) 02/10/2021   HGBA1C 6.3 (H) 09/11/2020   HGBA1C 6.3 (H) 05/29/2020   HGBA1C 6.4 (H) 01/10/2020   HGBA1C 6.2 07/07/2019   Lab Results  Component Value Date   INSULIN 22.6 02/10/2021   INSULIN 20.9 05/29/2020   INSULIN 24.0 01/10/2020   INSULIN 21.9 11/17/2018   INSULIN 24.0 08/18/2018   Lab Results  Component Value Date   TSH 1.600 05/25/2021   Lab Results  Component Value Date   CHOL 157 02/10/2021   HDL 41 02/10/2021   LDLCALC 79 02/10/2021   LDLDIRECT 136.0 11/16/2016   TRIG 222 (H) 02/10/2021   CHOLHDL 4 07/07/2019   Lab Results  Component Value Date   VD25OH 47.1 02/10/2021   VD25OH 57.5 09/18/2020   VD25OH 43.6 05/29/2020   Lab Results  Component Value Date   WBC 10.4 05/25/2021   HGB 15.2 05/25/2021   HCT 44.5 05/25/2021   MCV 84.4 05/25/2021   PLT 325 05/25/2021   No results found for: IRON, TIBC, FERRITIN  Attestation Statements:   Reviewed by clinician on day of visit: allergies, medications, problem list, medical history, surgical history, family history, social history, and previous encounter notes.   I, Trixie Dredge, am acting as transcriptionist for Dennard Nip, MD.  I have reviewed the above documentation for accuracy and completeness, and I agree with the above. -  Dennard Nip, MD

## 2021-08-22 ENCOUNTER — Ambulatory Visit (INDEPENDENT_AMBULATORY_CARE_PROVIDER_SITE_OTHER): Payer: BC Managed Care – PPO | Admitting: Psychologist

## 2021-08-22 DIAGNOSIS — Z630 Problems in relationship with spouse or partner: Secondary | ICD-10-CM | POA: Diagnosis not present

## 2021-08-22 DIAGNOSIS — F411 Generalized anxiety disorder: Secondary | ICD-10-CM | POA: Diagnosis not present

## 2021-08-22 DIAGNOSIS — Z9119 Patient's noncompliance with other medical treatment and regimen: Secondary | ICD-10-CM | POA: Diagnosis not present

## 2021-09-02 ENCOUNTER — Encounter: Payer: Self-pay | Admitting: Gastroenterology

## 2021-09-06 ENCOUNTER — Emergency Department (HOSPITAL_BASED_OUTPATIENT_CLINIC_OR_DEPARTMENT_OTHER)
Admission: EM | Admit: 2021-09-06 | Discharge: 2021-09-06 | Disposition: A | Discharge status: Home / Self Care | Payer: BC Managed Care – PPO | Attending: Emergency Medicine | Admitting: Emergency Medicine

## 2021-09-06 ENCOUNTER — Encounter (HOSPITAL_BASED_OUTPATIENT_CLINIC_OR_DEPARTMENT_OTHER): Payer: Self-pay

## 2021-09-06 ENCOUNTER — Telehealth: Payer: Self-pay | Admitting: Physician Assistant

## 2021-09-06 ENCOUNTER — Other Ambulatory Visit: Payer: Self-pay

## 2021-09-06 ENCOUNTER — Emergency Department (HOSPITAL_BASED_OUTPATIENT_CLINIC_OR_DEPARTMENT_OTHER): Payer: BC Managed Care – PPO

## 2021-09-06 DIAGNOSIS — I4891 Unspecified atrial fibrillation: Secondary | ICD-10-CM | POA: Diagnosis not present

## 2021-09-06 DIAGNOSIS — Z79899 Other long term (current) drug therapy: Secondary | ICD-10-CM | POA: Insufficient documentation

## 2021-09-06 DIAGNOSIS — Z7984 Long term (current) use of oral hypoglycemic drugs: Secondary | ICD-10-CM | POA: Diagnosis not present

## 2021-09-06 DIAGNOSIS — I4892 Unspecified atrial flutter: Secondary | ICD-10-CM

## 2021-09-06 DIAGNOSIS — Z7901 Long term (current) use of anticoagulants: Secondary | ICD-10-CM | POA: Insufficient documentation

## 2021-09-06 DIAGNOSIS — R000 Tachycardia, unspecified: Secondary | ICD-10-CM | POA: Diagnosis present

## 2021-09-06 DIAGNOSIS — I10 Essential (primary) hypertension: Secondary | ICD-10-CM | POA: Insufficient documentation

## 2021-09-06 DIAGNOSIS — R7303 Prediabetes: Secondary | ICD-10-CM | POA: Diagnosis not present

## 2021-09-06 LAB — TROPONIN I (HIGH SENSITIVITY)
Troponin I (High Sensitivity): 9 ng/L (ref <18)
Troponin I (High Sensitivity): 8 ng/L (ref <18)

## 2021-09-06 LAB — CBC
HCT: 49.9 % (ref 39.0–52.0)
Hemoglobin: 16.9 g/dL (ref 13.0–17.0)
MCH: 28.5 pg (ref 26.0–34.0)
MCHC: 33.9 g/dL (ref 30.0–36.0)
MCV: 84 fL (ref 80.0–100.0)
Platelets: 380 10*3/uL (ref 150–400)
RBC: 5.94 MIL/uL — ABNORMAL HIGH (ref 4.22–5.81)
RDW: 13.5 % (ref 11.5–15.5)
WBC: 11.8 10*3/uL — ABNORMAL HIGH (ref 4.0–10.5)
nRBC: 0 % (ref 0.0–0.2)

## 2021-09-06 LAB — BASIC METABOLIC PANEL
Anion gap: 10 (ref 5–15)
BUN: 16 mg/dL (ref 6–20)
CO2: 25 mmol/L (ref 22–32)
Calcium: 9.4 mg/dL (ref 8.9–10.3)
Chloride: 101 mmol/L (ref 98–111)
Creatinine, Ser: 0.98 mg/dL (ref 0.61–1.24)
GFR, Estimated: >60 mL/min (ref >60)
Glucose, Bld: 135 mg/dL — ABNORMAL HIGH (ref 70–99)
Potassium: 3.8 mmol/L (ref 3.5–5.1)
Sodium: 136 mmol/L (ref 135–145)

## 2021-09-06 LAB — MAGNESIUM: Magnesium: 2.1 mg/dL (ref 1.7–2.4)

## 2021-09-06 MED ORDER — ETOMIDATE 2 MG/ML IV SOLN
INTRAVENOUS | Status: AC
  Administered 2021-09-06: 10 mg via INTRAVENOUS
  Filled 2021-09-06: qty 10

## 2021-09-06 MED ORDER — LACTATED RINGERS IV BOLUS
1000.0000 mL | Freq: Once | INTRAVENOUS | Status: AC
  Administered 2021-09-06: 1000 mL via INTRAVENOUS

## 2021-09-06 MED ORDER — APIXABAN 5 MG PO TABS: 5.0000 mg | ORAL_TABLET | Freq: Two times a day (BID) | ORAL | 0 refills | Status: DC

## 2021-09-06 MED ORDER — ETOMIDATE 2 MG/ML IV SOLN: 0.1000 mg/kg | Freq: Once | INTRAVENOUS | Status: AC

## 2021-09-06 NOTE — ED Triage Notes (Signed)
Pt reports his smartwatch notified him of an elevated heart rate. Pt stated in June he had similar episode which required cardioversion. Pt denies CP/SOB. States he took morning cardizem PTA.

## 2021-09-06 NOTE — ED Provider Notes (Signed)
[facility] DEPARTMENT Provider Note   CSN: [id number] Arrival date & time: 09/06/21  1017     History No chief complaint on file.   Bryan [name] is a 60 y.o. male.  HPI Patient presents for tachycardia.  Onset of tachycardia was 4:30 AM.  He was alerted of it by his watch.  He denies any associated symptoms.  Previously, on June 19, he presented to the ED with tachycardia.  He was found to be in atrial flutter with 2-1 conduction.  He was given adenosine which confirmed underlying atrial flutter rhythm.  He was subsequently cardioverted and he followed up with the atrial fibrillation clinic.  Since that time, he has not had any recurrence of tachycardia until today.  Following the first episode, he was on Eliquis for 1 month.  He was given a prescription for Cardizem and told to take it only as needed if he develops any further tachycardia.  This morning with the onset of tachycardia, he took a 60 mg dose of Cardizem at around 430.  He took another 60 mg dose at around 10 AM.  The tachycardia has been persistent.  Throughout the morning, he has not developed any symptoms.  He denies any sensation of palpitations or shortness of breath.  He states that he feels totally normal and would have gone to work if his apple watch have not notified him of his rapid heart rate.  Prior to this episode, patient was in his normal state of health.  He denies any recent symptoms prior to this morning.    Past Medical History:  Diagnosis Date   Allergy    Dental crowns present    Hyperlipidemia    Lower back pain    Nephrolithiasis 2017   Prediabetes 09/04/2014   Seasonal allergies    Sebaceous cyst 04/2013   upper back   Sleep apnea    uses CPAP nightly   Urolithiasis 10/2016    Patient Active Problem List   Diagnosis Date Noted   Atrial flutter ([place]) 05/30/2021   Prediabetes 10/20/2018   Left chronic serous otitis media 07/26/2018   Essential hypertension 01/05/2018    Vitamin D deficiency 01/05/2018   Other fatigue 09/08/2017   Right bundle branch block 09/08/2017   Class 3 severe obesity with serious comorbidity and body mass index (BMI) of 45.0 to 49.9 in adult [facility]) 09/08/2017   Urolithiasis    PCP NOTES >>>>>>>>>>>>>>>>>>>>>>>> 05/23/2016   Hyperglycemia 18/84/1660   Chronic folliculitis 63/12/6008   Allergic rhinitis 02/27/2013   Insomnia 02/27/2013   Annual physical exam 02/26/2012   Hyperlipidemia 06/05/2008   Obstructive sleep apnea 06/05/2008    Past Surgical History:  Procedure Laterality Date   ADENOIDECTOMY     as a child   bone marrow donor     under anesthesia   CHOLECYSTECTOMY  01/12/2012   Procedure: LAPAROSCOPIC CHOLECYSTECTOMY WITH INTRAOPERATIVE CHOLANGIOGRAM;  Surgeon: [name], MD;  Location: [facility];  Service: General;  Laterality: N/A;   COLONOSCOPY     CYST EXCISION     cyst removal from back   EAR CYST EXCISION N/A 05/04/2013   Procedure: Excision subcutaneous mass posterior neck;  Surgeon: Imogene Burn. [name], MD;  Location: [facility];  Service: General;  Laterality: N/A;   POLYPECTOMY     wisdom teeth         Family History  Problem Relation Age of Onset   Ovarian cancer Mother    Hypertension Mother  Hyperlipidemia Mother    Obesity Mother    Cancer Father 81       renal   Kidney disease Father    Sleep apnea Father    Cancer Brother        leukemia   Diabetes Brother    Cancer Paternal Grandmother        breast   Colon cancer Maternal Aunt 71   Prostate cancer Neg Hx    Esophageal cancer Neg Hx    Rectal cancer Neg Hx    Stomach cancer Neg Hx    CAD Neg Hx    Colon polyps Neg Hx     Social History   Tobacco Use   Smoking status: Never   Smokeless tobacco: Never  Vaping Use   Vaping Use: Never used  Substance Use Topics   Alcohol use: Yes    Alcohol/week: 2.0 standard drinks    Types: 2 Glasses of wine per week    Comment: 1 glass wine every other day   Drug  use: No    Home Medications Prior to Admission medications   Medication Sig Start Date End Date Taking? Authorizing Provider  apixaban (ELIQUIS) 5 MG TABS tablet Take 1 tablet (5 mg total) by mouth 2 (two) times daily. 09/06/21 10/06/21  Godfrey Pick, MD  atorvastatin (LIPITOR) 10 MG tablet Take 1 tablet (10 mg total) by mouth daily. 07/30/21   Dennard Nip D, MD  azelastine (ASTELIN) 0.1 % nasal spray Place 2 sprays into both nostrils at bedtime as needed for rhinitis. Use in each nostril as directed 03/14/19   Colon Branch, MD  buPROPion [facility] SR) 150 MG 12 hr tablet Take 1 tablet (150 mg total) by mouth in the morning. 08/20/21   Dennard Nip D, MD  CO ENZYME Q-10 PO Take by mouth daily.    [provider]  diltiazem (CARDIZEM) 30 MG tablet Take 1 - 2 tablets by mouth every 6 hours as needed for heart rate greater than 130 05/25/21   Fondaw, Deloit S, PA  fish oil-omega-3 fatty acids 1000 MG capsule Take 1 g by mouth daily.    [provider]  hydrochlorothiazide (HYDRODIURIL) 25 MG tablet Take 1 tablet (25 mg total) by mouth daily. 05/21/21   Dennard Nip D, MD  Insulin Pen Needle (BD PEN NEEDLE NANO U/F) 32G X 4 MM MISC 1 each by Does not apply route daily. Use one pen needle once daily to inject Saxenda. 07/30/21   Dennard Nip D, MD  losartan (COZAAR) 50 MG tablet Take 1 tablet (50 mg total) by mouth daily. 08/20/21   Dennard Nip D, MD  Melatonin 1 MG CAPS Take 1 mg by mouth at bedtime as needed.     [provider]  metFORMIN (GLUCOPHAGE) 500 MG tablet TAKE 1 TABLET(500 MG) BY MOUTH THREE TIMES DAILY 07/30/21   Dennard Nip D, MD  Multiple Vitamins-Minerals (CENTRUM PO) Take 1 tablet by mouth daily.    [provider]  tirzepatide Darcel Bayley) 12.5 MG/0.5ML Pen Inject 12.5 mg into the skin once a week. 08/20/21 09/19/21  Dennard Nip D, MD  Vitamin D, Ergocalciferol, (DRISDOL) 1.25 MG (50000 UNIT) CAPS capsule Take 1 capsule (50,000 Units total)  by mouth every 7 (seven) days. 07/09/21   Dennard Nip D, MD    Allergies    Lisinopril  Review of Systems   Review of Systems  Constitutional:  Negative for activity change, appetite change, chills, fatigue and fever.  HENT:  Negative for ear pain and sore throat.   Eyes:  Negative for pain and visual disturbance.  Respiratory:  Negative for cough, chest tightness, shortness of breath and wheezing.   Cardiovascular:  Negative for chest pain, palpitations and leg swelling.  Gastrointestinal:  Negative for abdominal pain, nausea and vomiting.  Genitourinary:  Negative for dysuria, flank pain and hematuria.  Musculoskeletal:  Negative for arthralgias, back pain, joint swelling, myalgias and neck pain.  Skin:  Negative for color change and rash.  Neurological:  Negative for dizziness, seizures, syncope, speech difficulty, weakness, light-headedness, numbness and headaches.  All other systems reviewed and are negative.  Physical Exam Updated Vital Signs BP 102/69   Pulse 75   Temp 98.3 F (36.8 C)   Resp 17   Ht 5\' 11"  (1.803 m)   Wt (!) 139.3 kg   SpO2 97%   BMI 42.82 kg/m   Physical Exam Vitals and nursing note reviewed.  Constitutional:      General: He is not in acute distress.    Appearance: Normal appearance. He is well-developed. He is not ill-appearing, toxic-appearing or diaphoretic.  HENT:     Head: Normocephalic and atraumatic.     Right Ear: External ear normal.     Left Ear: External ear normal.     Nose: Nose normal.     Mouth/Throat:     Mouth: Mucous membranes are moist.     Pharynx: Oropharynx is clear.  Eyes:     Extraocular Movements: Extraocular movements intact.     Conjunctiva/sclera: Conjunctivae normal.  Cardiovascular:     Rate and Rhythm: Regular rhythm. Tachycardia present.     Heart sounds: No murmur heard. Pulmonary:     Effort: Pulmonary effort is normal. No respiratory distress.     Breath sounds: Normal breath sounds. No wheezing or  rales.  Chest:     Chest wall: No tenderness.  Abdominal:     Palpations: Abdomen is soft.     Tenderness: There is no abdominal tenderness.  Musculoskeletal:     Cervical back: Normal range of motion and neck supple. No rigidity.     Right lower leg: No edema.     Left lower leg: No edema.  Skin:    General: Skin is warm and dry.     Coloration: Skin is not jaundiced or pale.  Neurological:     General: No focal deficit present.     Mental Status: He is alert and oriented to person, place, and time.     Cranial Nerves: No cranial nerve deficit.     Sensory: No sensory deficit.     Motor: No weakness.  Psychiatric:        Mood and Affect: Mood normal.        Behavior: Behavior normal.        Thought Content: Thought content normal.        Judgment: Judgment normal.    ED Results / Procedures / Treatments   Labs (all labs ordered are listed, but only abnormal results are displayed) Labs Reviewed  CBC - Abnormal; Notable for the following components:      Result Value   WBC 11.8 (*)    RBC 5.94 (*)    All other components within normal limits  BASIC METABOLIC PANEL - Abnormal; Notable for the following components:   Glucose, Bld 135 (*)    All other components within normal limits  MAGNESIUM  TROPONIN I (HIGH SENSITIVITY)  TROPONIN I (HIGH SENSITIVITY)  EKG ED ECG REPORT   Date: 09/07/2021  Rate: 142   Rhythm: atrial flutter w/2:1 conduction  QRS Axis: indeterminate  Intervals: QT prolonged  ST/T Wave abnormalities: indeterminate  Conduction Disutrbances:right bundle branch block      Radiology DG Chest Port 1 View  Result Date: 09/06/2021 CLINICAL DATA:  Elevated heart rate. EXAM: PORTABLE CHEST 1 VIEW COMPARISON:  01/25/2021 FINDINGS: The heart size and mediastinal contours are within normal limits. Both lungs are clear. The visualized skeletal structures are unremarkable. IMPRESSION: No active disease. Electronically Signed   By: Dorise Bullion III M.D.    On: 09/06/2021 11:58    Procedures .Sedation  Date/Time: 09/06/2021 11:10 AM Performed by: Godfrey Pick, MD Authorized by: Godfrey Pick, MD   Consent:    Consent obtained:  Verbal and written   Consent given by:  Patient   Risks discussed:  Allergic reaction, dysrhythmia, inadequate sedation, respiratory compromise necessitating ventilatory assistance and intubation and prolonged hypoxia resulting in organ damage   Alternatives discussed:  Analgesia without sedation Universal protocol:    Procedure explained and questions answered to patient or proxy's satisfaction: yes     Immediately prior to procedure, a time out was called: yes     Patient identity confirmed:  Arm band and verbally with patient Indications:    Procedure performed:  Cardioversion Pre-sedation assessment:    Time since last food or drink:  12 hours   ASA classification: class 2 - patient with mild systemic disease     Mouth opening:  3 or more finger widths   Thyromental distance:  3 finger widths   Mallampati score:  III - soft palate, base of uvula visible   Neck mobility: normal     Pre-sedation assessments completed and reviewed: airway patency, cardiovascular function, hydration status, mental status, nausea/vomiting, pain level and respiratory function     Pre-sedation assessment completed:  09/06/2021 11:00 AM Immediate pre-procedure details:    Reviewed: vital signs     Verified: bag valve mask available, emergency equipment available, intubation equipment available, IV patency confirmed and oxygen available   Procedure details (see MAR for exact dosages):    Preoxygenation:  Nasal cannula and room air   Sedation:  Etomidate   Intended level of sedation: moderate (conscious sedation)   Analgesia:  None   Intra-procedure monitoring:  Blood pressure monitoring, cardiac monitor, continuous capnometry, continuous pulse oximetry, frequent LOC assessments and frequent vital sign checks   Intra-procedure events:  none     Total Provider sedation time (minutes):  15 Post-procedure details:    Post-sedation assessment completed:  09/06/2021 12:00 PM   Attendance: Constant attendance by certified staff until patient recovered     Recovery: Patient returned to pre-procedure baseline     Post-sedation assessments completed and reviewed: airway patency, cardiovascular function, hydration status, mental status, nausea/vomiting, pain level and respiratory function     Patient is stable for discharge or admission: yes     Procedure completion:  Tolerated well, no immediate complications .Cardioversion  Date/Time: 09/06/2021 11:10 AM Performed by: Godfrey Pick, MD Authorized by: Godfrey Pick, MD   Consent:    Consent obtained:  Verbal and written   Consent given by:  Patient   Risks discussed:  Induced arrhythmia, death and pain   Alternatives discussed:  No treatment, rate-control medication and delayed treatment Universal protocol:    Procedure explained and questions answered to patient or proxy's satisfaction: yes     Immediately prior to procedure a time out  was called: yes     Patient identity confirmed:  Verbally with patient and arm band Pre-procedure details:    Cardioversion basis:  Elective   Rhythm:  Atrial flutter   Electrode placement:  Anterior-posterior Patient sedated: Yes. Refer to sedation procedure documentation for details of sedation.  Attempt one:    Cardioversion mode:  Synchronous   Shock (Joules):  200   Shock outcome:  Conversion to normal sinus rhythm Post-procedure details:    Patient status:  Awake   Patient tolerance of procedure:  Tolerated well, no immediate complications   Medications Ordered in ED Medications  etomidate (AMIDATE) injection 13.94 mg (10 mg Intravenous Given 09/06/21 1113)  lactated ringers bolus 1,000 mL (0 mLs Intravenous Stopped 09/06/21 1433)    ED Course  I have reviewed the triage vital signs and the nursing notes.  Pertinent labs &  imaging results that were available during my care of the patient were reviewed by me and considered in my medical decision making (see chart for details).    MDM Rules/Calculators/A&P                         CRITICAL CARE Performed by: Godfrey Pick   Total critical care time: 35 minutes  Critical care time was exclusive of separately billable procedures and treating other patients.  Critical care was necessary to treat or prevent imminent or life-threatening deterioration.  Critical care was time spent personally by me on the following activities: development of treatment plan with patient and/or surrogate as well as nursing, discussions with consultants, evaluation of patient's response to treatment, examination of patient, obtaining history from patient or surrogate, ordering and performing treatments and interventions, ordering and review of laboratory studies, ordering and review of radiographic studies, pulse oximetry and re-evaluation of patient's condition.   Patient presents for tachycardia.  Onset was 4:30 AM.  He was alarmed by his watch.  Tachycardia has been persistent since that time, despite 260 mg doses of Cardizem that he took at home.  Patient states that he always wears his watch and is confident that the tachycardia started at 430 and that he has not had any other episodes of tachycardia since a similar episode in June.  He denies any associated symptoms with the tachycardia.  Upon his arrival, EKG was obtained which shows heart rate in the range of 140-145.  EKG, when compared to prior EKG from his episode in June is similar.  In June, he was given adenosine which revealed underlying atrial flutter rhythm.  I suspect that that is his current rhythm.  ZOLL pads were placed on the patient.  Given his stability, I was able to discuss management with the patient and to consult cardiology.  Patient does feel comfortable with elective cardioversion.  I spoke with cardiology, who agrees  with this plan.  Patient was consented for procedural sedation as well as cardioversion.  This was performed, as per procedure notes above.  Following synchronized cardioversion, patient returned to normal sinus rhythm.  Subsequent EKG showed RBBB, consistent with his baseline EKGs.  Patient was monitored in the ED postprocedure for any recurrence or any new symptoms.  Lab results showed normal electrolytes.  On reassessment, patient denied any new symptoms.  He was found to have soft blood pressures.  Patient states that his normal blood pressure is in the range of 120/80.  He does take a single antihypertensive medication but has not taken it today.  He did  take the 260 mg doses of Cardizem earlier this morning.  This may be contributing to his low blood pressure in the ED.  He also feels that he may be dehydrated.  After several hours in the ED, he has not urinated.  Bolus of IV fluids was given with subsequent improvement in his blood pressure.  Patient also reported decreased fatigue following the IV fluids.  He remained in sinus rhythm.  Per cardiology recommendation, patient to resume taking Eliquis.  This was prescribed.  Patient is also to follow-up in atrial fibrillation clinic for further management.  He may benefit from a daily rate control medication.  Given his cell blood pressures in the ED, I will not start this today.  Initiation of this medication to be discussed at atrial fibrillation clinic follow-up.  Patient was discharged in good condition.  Final Clinical Impression(s) / ED Diagnoses Final diagnoses:  Tachycardia  Atrial flutter, unspecified type [facility])    Rx / DC Orders ED Discharge Orders          Ordered    Amb referral to AFIB Clinic        09/06/21 1430    apixaban (ELIQUIS) 5 MG TABS tablet  2 times daily,   Status:  Discontinued        09/06/21 1432    apixaban (ELIQUIS) 5 MG TABS tablet  2 times daily        09/06/21 1435             Godfrey Pick, MD 09/07/21  0041

## 2021-09-06 NOTE — ED Notes (Addendum)
Patient placed on ETCo2 monitor at this time. Suction set up and AMBU bag at bedside

## 2021-09-06 NOTE — ED Notes (Signed)
Rad tech attempted to get patient for 2-view chest x-ray. Was told by RN Raeanne Barry that patient would need portable chest x-ray instead due to their condition.  Message sent via Epic messenger to RN [facility] asking to advise when patient is ready for their imaging.  -DMG

## 2021-09-06 NOTE — Telephone Encounter (Signed)
   The patient called the answering service after-hours today. Chart reviewed. Prior hx atrial flutter, no longer on [place] given prior CHADSVASC 1, only with PRN diltiazem at this juncture. His watch alerted him to recurrent atrial flutter this AM with HR 135-140 range. He took 4 diltiazem over the course of the AM but HR remains >100. He otherwise feels OK. Last labs showed hypokalemia in 05/2021. Do not see ongoing supplementation. Given recurrent arrhythmia and tachy despite use of PRN diltiazem as well prior electrolyte disturbance have recommended he proceed to ED for evaluation and not to drive himself. The patient verbalized understanding and gratitude.  Charlie Pitter, PA-C

## 2021-09-06 NOTE — ED Notes (Signed)
Unable to rate pain during procedure

## 2021-09-06 NOTE — Discharge Instructions (Addendum)
Resume taking Eliquis.  Follow-up with atrial fibrillation clinic.  During follow-up, discuss starting a rate controlling blood pressure medication daily.  Return to the ED for any recurrence of rapid heart rate or any other concerning symptoms.

## 2021-09-10 ENCOUNTER — Encounter (INDEPENDENT_AMBULATORY_CARE_PROVIDER_SITE_OTHER): Payer: Self-pay | Admitting: Family Medicine

## 2021-09-10 ENCOUNTER — Ambulatory Visit (INDEPENDENT_AMBULATORY_CARE_PROVIDER_SITE_OTHER): Payer: BC Managed Care – PPO | Admitting: Family Medicine

## 2021-09-10 ENCOUNTER — Other Ambulatory Visit: Payer: Self-pay

## 2021-09-10 VITALS — BP 128/83 | HR 86 | Temp 97.8°F | Ht 71.0 in | Wt 301.0 lb

## 2021-09-10 DIAGNOSIS — Z9189 Other specified personal risk factors, not elsewhere classified: Secondary | ICD-10-CM

## 2021-09-10 DIAGNOSIS — F3289 Other specified depressive episodes: Secondary | ICD-10-CM

## 2021-09-10 DIAGNOSIS — I10 Essential (primary) hypertension: Secondary | ICD-10-CM

## 2021-09-10 DIAGNOSIS — R7303 Prediabetes: Secondary | ICD-10-CM

## 2021-09-10 DIAGNOSIS — E559 Vitamin D deficiency, unspecified: Secondary | ICD-10-CM

## 2021-09-10 DIAGNOSIS — Z6841 Body Mass Index (BMI) 40.0 and over, adult: Secondary | ICD-10-CM

## 2021-09-10 MED ORDER — TIRZEPATIDE 12.5 MG/0.5ML ~~LOC~~ SOAJ: 12.5000 mg | SUBCUTANEOUS | 0 refills | Status: DC

## 2021-09-10 MED ORDER — BUPROPION HCL ER (SR) 150 MG PO TB12: 150.0000 mg | ORAL_TABLET | Freq: Every morning | ORAL | 0 refills | Status: DC

## 2021-09-10 MED ORDER — HYDROCHLOROTHIAZIDE 25 MG PO TABS: 25.0000 mg | ORAL_TABLET | Freq: Every day | ORAL | 0 refills | Status: DC

## 2021-09-10 MED ORDER — LOSARTAN POTASSIUM 50 MG PO TABS
50.0000 mg | ORAL_TABLET | Freq: Every day | ORAL | 0 refills | Status: DC
Start: 2021-09-10 — End: 2021-10-01

## 2021-09-11 ENCOUNTER — Ambulatory Visit (HOSPITAL_COMMUNITY)
Admission: RE | Admit: 2021-09-11 | Discharge: 2021-09-11 | Disposition: A | Discharge status: Home / Self Care | Payer: BC Managed Care – PPO | Source: Ambulatory Visit | Attending: Physician Assistant | Admitting: Physician Assistant

## 2021-09-11 ENCOUNTER — Encounter (HOSPITAL_COMMUNITY): Payer: Self-pay | Admitting: Physician Assistant

## 2021-09-11 VITALS — BP 116/72 | HR 79 | Ht 71.0 in | Wt 305.6 lb

## 2021-09-11 DIAGNOSIS — G4733 Obstructive sleep apnea (adult) (pediatric): Secondary | ICD-10-CM | POA: Insufficient documentation

## 2021-09-11 DIAGNOSIS — I4892 Unspecified atrial flutter: Secondary | ICD-10-CM

## 2021-09-11 DIAGNOSIS — E669 Obesity, unspecified: Secondary | ICD-10-CM | POA: Insufficient documentation

## 2021-09-11 DIAGNOSIS — I10 Essential (primary) hypertension: Secondary | ICD-10-CM | POA: Insufficient documentation

## 2021-09-11 DIAGNOSIS — Z6841 Body Mass Index (BMI) 40.0 and over, adult: Secondary | ICD-10-CM | POA: Insufficient documentation

## 2021-09-11 DIAGNOSIS — Z7901 Long term (current) use of anticoagulants: Secondary | ICD-10-CM | POA: Diagnosis not present

## 2021-09-11 DIAGNOSIS — Z79899 Other long term (current) drug therapy: Secondary | ICD-10-CM | POA: Insufficient documentation

## 2021-09-11 DIAGNOSIS — Z8249 Family history of ischemic heart disease and other diseases of the circulatory system: Secondary | ICD-10-CM | POA: Insufficient documentation

## 2021-09-11 DIAGNOSIS — I483 Typical atrial flutter: Secondary | ICD-10-CM

## 2021-09-11 DIAGNOSIS — Z888 Allergy status to other drugs, medicaments and biological substances status: Secondary | ICD-10-CM | POA: Diagnosis not present

## 2021-09-11 MED ORDER — METOPROLOL SUCCINATE ER 25 MG PO TB24: 25.0000 mg | ORAL_TABLET | Freq: Every day | ORAL | 3 refills | Status: DC

## 2021-09-11 NOTE — Patient Instructions (Signed)
Start metoprolol 25mg once a day at bedtime 

## 2021-09-11 NOTE — Progress Notes (Signed)
Primary Care Physician: Colon Branch, MD Primary Cardiologist: none Primary Electrophysiologist: none Referring Physician: [place]   Bryan [name] is a 60 y.o. male with a history of HTN, OSA, atrial flutter who presents for follow up in the [facility].  The patient was initially diagnosed with atrial flutter 05/25/21 after presenting to the ED with heart racing noted on his smart watch. He reports that he felt "sluggish" but this was mild. He would not have presented to the ED if his watch had not shown a heart rate of 140-150 bpm. ECG at the ED showed atrial flutter with 2:1 conduction and he underwent DCCV. Patient is on Eliquis for a CHADS2VASC score of 1.   On follow up today, patient presented to ED 09/06/21 with rapid heart rates. He had tried his PRN diltiazem but this was ineffective to slow his heart rate. He went to the ED and was cardioverted again. He reports that he feels well today and was not very symptomatic when he was in atrial flutter. There were no specific triggers that he could identify. His Eliquis was resumed.   Today, he denies symptoms of palpitations, chest pain, shortness of breath, orthopnea, PND, lower extremity edema, dizziness, presyncope, syncope, bleeding, or neurologic sequela. The patient is tolerating medications without difficulties and is otherwise without complaint today.    Atrial Fibrillation Risk Factors:  he does have symptoms or diagnosis of sleep apnea. he is compliant with CPAP therapy. he does not have a history of rheumatic fever. he does have a history of alcohol use. 6-7 drinks weekly  The patient does have a history of early familial atrial fibrillation or other arrhythmias. Mother has afib.  he has a BMI of Body mass index is 42.62 kg/m.[name] Filed Weights   09/11/21 0937  Weight: (!) 138.6 kg      Family History  Problem Relation Age of Onset   Ovarian cancer Mother    Hypertension Mother     Hyperlipidemia Mother    Obesity Mother    Cancer Father 68       renal   Kidney disease Father    Sleep apnea Father    Cancer Brother        leukemia   Diabetes Brother    Cancer Paternal Grandmother        breast   Colon cancer Maternal Aunt 8   Prostate cancer Neg Hx    Esophageal cancer Neg Hx    Rectal cancer Neg Hx    Stomach cancer Neg Hx    CAD Neg Hx    Colon polyps Neg Hx      Atrial Fibrillation Management history:  Previous antiarrhythmic drugs: none Previous cardioversions: 05/25/21, 09/06/21 Previous ablations: none CHADS2VASC score: 1 Anticoagulation history: Eliquis   Past Medical History:  Diagnosis Date   Allergy    Dental crowns present    Hyperlipidemia    Lower back pain    Nephrolithiasis 2017   Prediabetes 09/04/2014   Seasonal allergies    Sebaceous cyst 04/2013   upper back   Sleep apnea    uses CPAP nightly   Urolithiasis 10/2016   Past Surgical History:  Procedure Laterality Date   ADENOIDECTOMY     as a child   bone marrow donor     under anesthesia   CHOLECYSTECTOMY  01/12/2012   Procedure: LAPAROSCOPIC CHOLECYSTECTOMY WITH INTRAOPERATIVE CHOLANGIOGRAM;  Surgeon: [name], MD;  Location: [facility];  Service: General;  Laterality: N/A;   COLONOSCOPY     CYST EXCISION     cyst removal from back   EAR CYST EXCISION N/A 05/04/2013   Procedure: Excision subcutaneous mass posterior neck;  Surgeon: [name], MD;  Location: [facility];  Service: General;  Laterality: N/A;   POLYPECTOMY     wisdom teeth      Current Outpatient Medications  Medication Sig Dispense Refill   apixaban (ELIQUIS) 5 MG TABS tablet Take 1 tablet (5 mg total) by mouth 2 (two) times daily. 60 tablet 0   atorvastatin (LIPITOR) 10 MG tablet Take 1 tablet (10 mg total) by mouth daily. 30 tablet 0   azelastine (ASTELIN) 0.1 % nasal spray Place 2 sprays into both nostrils at bedtime as needed for rhinitis. Use in each nostril as  directed 30 mL 5   buPROPion (WELLBUTRIN SR) 150 MG 12 hr tablet Take 1 tablet (150 mg total) by mouth in the morning. 30 tablet 0   CO ENZYME Q-10 PO Take by mouth daily.     diltiazem (CARDIZEM) 30 MG tablet Take 1 - 2 tablets by mouth every 6 hours as needed for heart rate greater than 130 30 tablet 0   fish oil-omega-3 fatty acids 1000 MG capsule Take 1 g by mouth daily.     hydrochlorothiazide (HYDRODIURIL) 25 MG tablet Take 1 tablet (25 mg total) by mouth daily. 30 tablet 0   Insulin Pen Needle (BD PEN NEEDLE NANO U/F) 32G X 4 MM MISC 1 each by Does not apply route daily. Use one pen needle once daily to inject Saxenda. 100 each 0   losartan (COZAAR) 50 MG tablet Take 1 tablet (50 mg total) by mouth daily. 30 tablet 0   Melatonin 1 MG CAPS Take 1 mg by mouth at bedtime as needed.      metFORMIN (GLUCOPHAGE) 500 MG tablet TAKE 1 TABLET(500 MG) BY MOUTH THREE TIMES DAILY 90 tablet 0   metoprolol succinate (TOPROL XL) 25 MG 24 hr tablet Take 1 tablet (25 mg total) by mouth at bedtime. 30 tablet 3   Multiple Vitamins-Minerals (CENTRUM PO) Take 1 tablet by mouth daily.     tirzepatide (MOUNJARO) 12.5 MG/0.5ML Pen Inject 12.5 mg into the skin once a week. 2 mL 0   Vitamin D, Ergocalciferol, (DRISDOL) 1.25 MG (50000 UNIT) CAPS capsule Take 1 capsule (50,000 Units total) by mouth every 7 (seven) days. 4 capsule 0   No current facility-administered medications for this encounter.    Allergies  Allergen Reactions   Lisinopril Cough    Social History   Socioeconomic History   Marital status: Married    Spouse name: Patty   Number of children: 3   Years of education: Not on file   Highest education level: Not on file  Occupational History   Occupation:  manufactoring rep, A/C units     Employer: [employer]  Tobacco Use   Smoking status: Never   Smokeless tobacco: Never  Vaping Use   Vaping Use: Never used  Substance and Sexual Activity   Alcohol use: Yes    Alcohol/week: 3.0  - 4.0 standard drinks    Types: 3 - 4 Glasses of wine per week    Comment: 1 glass wine every other day   Drug use: No   Sexual activity: Not on file  Other Topics Concern   Not on file  Social History Narrative   3 children, 1 at home,  2 in college    Wife w/ breast ca dx 2016, doing well   she developed SZs 2017, sees Dr Jannifer Franklin          Social Determinants of Health   Financial Resource Strain: Not on file  Food Insecurity: Not on file  Transportation Needs: Not on file  Physical Activity: Not on file  Stress: Not on file  Social Connections: Not on file  Intimate Partner Violence: Not on file     ROS- All systems are reviewed and negative except as per the HPI above.  Physical Exam: Vitals:   09/11/21 0937  BP: 116/72  Pulse: 79  Weight: (!) 138.6 kg  Height: 5\' 11"  (1.803 m)    GEN- The patient is a well appearing obese male, alert and oriented x 3 today.   HEENT-head normocephalic, atraumatic, sclera clear, conjunctiva pink, hearing intact, trachea midline. Lungs- Clear to ausculation bilaterally, normal work of breathing Heart- Regular rate and rhythm, no murmurs, rubs or gallops  GI- soft, NT, ND, + BS Extremities- no clubbing, cyanosis, or edema MS- no significant deformity or atrophy Skin- no rash or lesion Psych- euthymic mood, full affect Neuro- strength and sensation are intact   Wt Readings from Last 3 Encounters:  09/11/21 (!) 138.6 kg  09/10/21 (!) 136.5 kg  09/06/21 (!) 139.3 kg    EKG today demonstrates  SR, RBBB Vent. rate 79 BPM PR interval 168 ms QRS duration 154 ms QT/QTcB 422/483 ms  Echo 06/18/21  1. Left ventricular ejection fraction, by estimation, is 65 to 70%. The  left ventricle has normal function. The left ventricle has no regional  wall motion abnormalities. Left ventricular diastolic parameters are  consistent with Grade I diastolic dysfunction (impaired relaxation).   2. Right ventricular systolic function is normal.  The right ventricular  size is normal. Tricuspid regurgitation signal is inadequate for assessing PA pressure.   3. The mitral valve is grossly normal. No evidence of mitral valve  regurgitation. No evidence of mitral stenosis.   4. The aortic valve is tricuspid. Aortic valve regurgitation is not  visualized. No aortic stenosis is present.   5. The inferior vena cava is normal in size with greater than 50%  respiratory variability, suggesting right atrial pressure of 3 mmHg.   Comparison(s): A prior study was performed on 09/21/2017. No significant change from prior study. Prior images reviewed side by side.    Epic records are reviewed at length today  CHA2DS2-VASc Score = 1  The patient's score is based upon: CHF History: 0 HTN History: 1 Diabetes History: 0 Stroke History: 0 Vascular Disease History: 0 Age Score: 0 Gender Score: 0     ASSESSMENT AND PLAN: 1. Atrial flutter The patient's CHA2DS2-VASc score is 1, indicating a 0.6% annual risk of stroke.   S/p DCCV 09/06/21 Patient now with two cardioversions in 4 months. We discussed therapeutic options today including AAD vs ablation. Patient interested in discussing possible front-line ablation with EP.  Continue Eliquis 5 mg BID Start Toprol 25 mg daily Continue diltiazem 30 mg q 4 hours PRN for heart racing.  2. Obesity Body mass index is 42.62 kg/m. Lifestyle modification was discussed and encouraged including regular physical activity and weight reduction. He has lost 21 lbs, congratulated.  [facility]  3. Obstructive sleep apnea Patient reports compliance with CPAP therapy.  4. HTN Stable, med changes as above.    Follow up with EP.  [facility] [address] [place], [place] [address] [phone] 09/11/2021 1:24 PM

## 2021-09-15 ENCOUNTER — Encounter: Payer: Self-pay | Admitting: Internal Medicine

## 2021-09-15 ENCOUNTER — Other Ambulatory Visit (INDEPENDENT_AMBULATORY_CARE_PROVIDER_SITE_OTHER): Payer: Self-pay | Admitting: Family Medicine

## 2021-09-15 ENCOUNTER — Other Ambulatory Visit: Payer: Self-pay

## 2021-09-15 ENCOUNTER — Ambulatory Visit (INDEPENDENT_AMBULATORY_CARE_PROVIDER_SITE_OTHER): Payer: BC Managed Care – PPO | Admitting: Internal Medicine

## 2021-09-15 VITALS — BP 106/74 | HR 74 | Temp 98.0°F | Resp 18 | Ht 71.0 in | Wt 304.2 lb

## 2021-09-15 DIAGNOSIS — Z0000 Encounter for general adult medical examination without abnormal findings: Secondary | ICD-10-CM

## 2021-09-15 DIAGNOSIS — Z23 Encounter for immunization: Secondary | ICD-10-CM

## 2021-09-15 DIAGNOSIS — Z0001 Encounter for general adult medical examination with abnormal findings: Secondary | ICD-10-CM | POA: Diagnosis not present

## 2021-09-15 DIAGNOSIS — R739 Hyperglycemia, unspecified: Secondary | ICD-10-CM

## 2021-09-15 DIAGNOSIS — E559 Vitamin D deficiency, unspecified: Secondary | ICD-10-CM

## 2021-09-15 DIAGNOSIS — I4892 Unspecified atrial flutter: Secondary | ICD-10-CM

## 2021-09-15 DIAGNOSIS — I10 Essential (primary) hypertension: Secondary | ICD-10-CM

## 2021-09-15 DIAGNOSIS — E7849 Other hyperlipidemia: Secondary | ICD-10-CM | POA: Diagnosis not present

## 2021-09-15 LAB — ALT: ALT: 21 U/L (ref 0–53)

## 2021-09-15 LAB — VITAMIN D 25 HYDROXY (VIT D DEFICIENCY, FRACTURES): VITD: 55.45 ng/mL (ref 30.00–100.00)

## 2021-09-15 LAB — AST: AST: 13 U/L (ref 0–37)

## 2021-09-15 LAB — PSA: PSA: 0.86 ng/mL (ref 0.10–4.00)

## 2021-09-15 LAB — HEMOGLOBIN A1C: Hgb A1c MFr Bld: 5.8 % (ref 4.6–6.5)

## 2021-09-15 NOTE — Patient Instructions (Addendum)
Recommend to proceed with the following vaccine at your pharmacy:  Covid #3  You are due for your repeat colonoscopy with Dr. Henrene Pastor. Please call his office at [phone] to get on his schedule.    Check the  blood pressure twice weekly  BP GOAL is between 110/65 and  135/85. If it is consistently higher or lower, let me know      GO TO THE LAB : Get the blood work     [place], [place] back for   a physical exam in 1 year

## 2021-09-15 NOTE — Progress Notes (Signed)
Chief Complaint:   OBESITY Bryan [name] is here to discuss his progress with his obesity treatment plan along with follow-up of his obesity related diagnoses. Bryan [name] is on the Category 3 Plan and states he is following his eating plan approximately 80% of the time. Bryan [name] states he is walking and rowing for 25 minutes 5 times per week.  Today's visit was #: 35 Starting weight: 326 lbs Starting date: 09/07/2017 Today's weight: 301 lbs Today's date: 09/10/2021 Total lbs lost to date: 25 Total lbs lost since last in-office visit: 4  Interim History: Bryan [name] has done well with weight loss. He sometimes struggles to eat all of the food on his plan at times. He had another episode of afib and he is following up with the afib clinic soon.  Subjective:   1. Vitamin D deficiency Bryan [name] is stable on Vit D, and he denies signs of over-replacement.  2. Essential hypertension Bryan [name]'s blood pressure is controlled on his medications. He denies signs of hypotension. His goal is to lose enough weight to be able to decrease his medications.  3. Pre-diabetes Bryan [name] is stable on Mounjaro, and he notes decreased polyphagia. He sometimes struggles to eat all of the food on his plan.  4. Other depression with emotional eating Bryan [name] is stable on Wellbutrin, and he notes decreased emotional eating behaviors. He denies insomnia and his blood pressure is stable.  5. At risk for dehydration Bryan [name] is at risk for dehydration if water intake is too low.  Assessment/Plan:   1. Vitamin D deficiency Low Vitamin D level contributes to fatigue and are associated with obesity, breast, and colon cancer. We will refill prescription Vitamin D and Bryan [name] will follow-up for routine testing of Vitamin D, at least 2-3 times per year to avoid over-replacement.  2. Essential hypertension Bryan [name] will continue with his diet and exercise to improve blood pressure control. We refill losartan and hydrochlorothiazide  for 1 month. He will watch for signs of hypotension as he continues his lifestyle modifications.  - hydrochlorothiazide (HYDRODIURIL) 25 MG tablet; Take 1 tablet (25 mg total) by mouth daily.  Dispense: 30 tablet; Refill: 0 - losartan (COZAAR) 50 MG tablet; Take 1 tablet (50 mg total) by mouth daily.  Dispense: 30 tablet; Refill: 0  3. Pre-diabetes Bryan [name] will continue to work on weight loss, exercise, and decreasing simple carbohydrates to help decrease the risk of diabetes. We will refill Mounjaro for 1 month.  - tirzepatide (MOUNJARO) 12.5 MG/0.5ML Pen; Inject 12.5 mg into the skin once a week.  Dispense: 2 mL; Refill: 0  4. Other depression with emotional eating Behavior modification techniques were discussed today to help Bryan [name] deal with his emotional/non-hunger eating behaviors. We will refill Wellbutrin SR for 1 month. Orders and follow up as documented in patient record.   - buPROPion (WELLBUTRIN SR) 150 MG 12 hr tablet; Take 1 tablet (150 mg total) by mouth in the morning.  Dispense: 30 tablet; Refill: 0  5. At risk for dehydration Bryan [name] was given approximately 15 minutes dehydration prevention counseling today. Bryan [name] is at risk for dehydration due to weight loss and current medication(s). He was encouraged to hydrate and monitor fluid status to avoid dehydration as well as weight loss plateaus.   6. Obesity with current BMI 42.1 Bryan [name] is currently in the action stage of change. As such, his goal is to continue with weight loss efforts. He has agreed to the Category 3 Plan.   Exercise goals: As is.  Behavioral modification  strategies: increasing lean protein intake.  Bryan [name] has agreed to follow-up with our clinic in 3 weeks. He was informed of the importance of frequent follow-up visits to maximize his success with intensive lifestyle modifications for his multiple health conditions.   Objective:   Blood pressure 128/83, pulse 86, temperature 97.8 F (36.6 C), height  5\' 11"  (1.803 m), weight (!) 301 lb (136.5 kg), SpO2 97 %. Body mass index is 41.98 kg/m.  General: Cooperative, alert, well developed, in no acute distress. HEENT: Conjunctivae and lids unremarkable. Cardiovascular: Regular rhythm.  Lungs: Normal work of breathing. Neurologic: No focal deficits.   Lab Results  Component Value Date   CREATININE 0.98 09/06/2021   BUN 16 09/06/2021   NA 136 09/06/2021   K 3.8 09/06/2021   CL 101 09/06/2021   CO2 25 09/06/2021   Lab Results  Component Value Date   ALT 30 03/05/2021   AST 19 03/05/2021   ALKPHOS 58 03/05/2021   BILITOT 0.2 03/05/2021   Lab Results  Component Value Date   HGBA1C 6.2 (H) 02/10/2021   HGBA1C 6.3 (H) 09/11/2020   HGBA1C 6.3 (H) 05/29/2020   HGBA1C 6.4 (H) 01/10/2020   HGBA1C 6.2 07/07/2019   Lab Results  Component Value Date   INSULIN 22.6 02/10/2021   INSULIN 20.9 05/29/2020   INSULIN 24.0 01/10/2020   INSULIN 21.9 11/17/2018   INSULIN 24.0 08/18/2018   Lab Results  Component Value Date   TSH 1.600 05/25/2021   Lab Results  Component Value Date   CHOL 157 02/10/2021   HDL 41 02/10/2021   LDLCALC 79 02/10/2021   LDLDIRECT 136.0 11/16/2016   TRIG 222 (H) 02/10/2021   CHOLHDL 4 07/07/2019   Lab Results  Component Value Date   VD25OH 47.1 02/10/2021   VD25OH 57.5 09/18/2020   VD25OH 43.6 05/29/2020   Lab Results  Component Value Date   WBC 11.8 (H) 09/06/2021   HGB 16.9 09/06/2021   HCT 49.9 09/06/2021   MCV 84.0 09/06/2021   PLT 380 09/06/2021   No results found for: IRON, TIBC, FERRITIN  Attestation Statements:   Reviewed by clinician on day of visit: allergies, medications, problem list, medical history, surgical history, family history, social history, and previous encounter notes.   I, Bryan [name], am acting as transcriptionist for Dennard Nip, MD.  I have reviewed the above documentation for accuracy and completeness, and I agree with the above. -  .mm

## 2021-09-15 NOTE — Telephone Encounter (Signed)
Dr.Beasley 

## 2021-09-15 NOTE — Assessment & Plan Note (Signed)
Here for CPX Atrial flutter: Since the last visit, was Dx w/ Atrial Flutter 05/25/2021.  Typically asx, he is alerted by his wristwatch.  Cardioverted x2. Last visit with cardiology 09/11/2021.  They recommend to continue Eliquis, started Toprol, diltiazem as needed palpitations.  He is contemplating ablation. Diabetes: Seen at the wellness clinic, check A1c HTN: Recently metoprolol was added to his normal regimen, BP is a slightly low, he denies any symptoms.  No change Hyperlipidemia: Well-controlled on atorvastatin. Vitamin D deficiency: On supplements, labs Morbid obesity: On Wellbutrin and Mounjaro weekly.  Follow-up elsewhere RTC 1 year.  Sooner if needed

## 2021-09-15 NOTE — Assessment & Plan Note (Signed)
-  Td 09-2021 - PNM 23 : 2021 (h/o OSA, obesity) - had 2 covid vax, rec booster w/ new vaccine - s/p shingrex   -Flu shot today -CCS : +FH Mat. Aunt had colon Ca Cscope  aprox  12/07/2002,  normal . cscope 2015- polyps, next was due 2020, C-scope has been postponed by cardiac issues.  The patient does plan to reach out to GI.  -Prostate ca screening: No symptoms, DRE normal, check a PSA - diet, exercise: sees the wellness clinic -Labs reviewed, check LFTs, A1c, vitamin D, PSA

## 2021-09-15 NOTE — Progress Notes (Signed)
Subjective:    Patient ID: Bryan [name], male    DOB: Apr 07, 1961, 60 y.o.   MRN: [id number]  DOS:  09/15/2021 Type of visit - description: CPX  Since the last visit, he was diagnosed with atrial tachycardia.  Chart reviewed. He actually has no symptoms, typical alerted  by his wristwatch.   Review of Systems Has on and off cough, he thinks related to mucus pooling of his throat, some PND.  Other than above, a 14 point review of systems is negative     Past Medical History:  Diagnosis Date   Allergy    Dental crowns present    Hyperlipidemia    Lower back pain    Nephrolithiasis 2017   Prediabetes 09/04/2014   Seasonal allergies    Sebaceous cyst 04/2013   upper back   Sleep apnea    uses CPAP nightly   Urolithiasis 10/2016    Past Surgical History:  Procedure Laterality Date   ADENOIDECTOMY     as a child   bone marrow donor     under anesthesia   CHOLECYSTECTOMY  01/12/2012   Procedure: LAPAROSCOPIC CHOLECYSTECTOMY WITH INTRAOPERATIVE CHOLANGIOGRAM;  Surgeon: [name], MD;  Location: [facility];  Service: General;  Laterality: N/A;   COLONOSCOPY     CYST EXCISION     cyst removal from back   EAR CYST EXCISION N/A 05/04/2013   Procedure: Excision subcutaneous mass posterior neck;  Surgeon: Imogene Burn. [name], MD;  Location: [facility];  Service: General;  Laterality: N/A;   POLYPECTOMY     wisdom teeth     Social History   Socioeconomic History   Marital status: Married    Spouse name: Patty   Number of children: 3   Years of education: Not on file   Highest education level: Not on file  Occupational History   Occupation:  manufactoring rep, A/C units     Employer: [employer]  Tobacco Use   Smoking status: Never   Smokeless tobacco: Never  Vaping Use   Vaping Use: Never used  Substance and Sexual Activity   Alcohol use: Yes    Alcohol/week: 3.0 - 4.0 standard drinks    Types: 3 - 4 Glasses of wine per week    Comment: 1  glass wine every other day   Drug use: No   Sexual activity: Not on file  Other Topics Concern   Not on file  Social History Narrative   3 children, 1 at home,  2 in college    Wife w/ breast ca dx 2016, doing well   she developed SZs 2017, sees Dr Jannifer Franklin          Social Determinants of Health   Financial Resource Strain: Not on file  Food Insecurity: Not on file  Transportation Needs: Not on file  Physical Activity: Not on file  Stress: Not on file  Social Connections: Not on file  Intimate Partner Violence: Not on file     Allergies as of 09/15/2021       Reactions   Lisinopril Cough        Medication List        Accurate as of September 15, 2021  8:57 PM. If you have any questions, ask your nurse or doctor.          apixaban 5 MG Tabs tablet Commonly known as: ELIQUIS Take 1 tablet (5 mg total) by mouth 2 (two) times daily.   atorvastatin  10 MG tablet Commonly known as: Lipitor Take 1 tablet (10 mg total) by mouth daily.   azelastine 0.1 % nasal spray Commonly known as: ASTELIN Place 2 sprays into both nostrils at bedtime as needed for rhinitis. Use in each nostril as directed   BD Pen Needle Nano U/F 32G X 4 MM Misc Generic drug: Insulin Pen Needle 1 each by Does not apply route daily. Use one pen needle once daily to inject Saxenda.   buPROPion 150 MG 12 hr tablet Commonly known as: Wellbutrin SR Take 1 tablet (150 mg total) by mouth in the morning.   CENTRUM PO Take 1 tablet by mouth daily.   CO ENZYME Q-10 PO Take by mouth daily.   diltiazem 30 MG tablet Commonly known as: CARDIZEM Take 1 - 2 tablets by mouth every 6 hours as needed for heart rate greater than 130   fish oil-omega-3 fatty acids 1000 MG capsule Take 1 g by mouth daily.   hydrochlorothiazide 25 MG tablet Commonly known as: HYDRODIURIL Take 1 tablet (25 mg total) by mouth daily.   losartan 50 MG tablet Commonly known as: COZAAR Take 1 tablet (50 mg total) by mouth  daily.   Melatonin 1 MG Caps Take 1 mg by mouth at bedtime as needed.   metFORMIN 500 MG tablet Commonly known as: GLUCOPHAGE TAKE 1 TABLET(500 MG) BY MOUTH THREE TIMES DAILY   metoprolol succinate 25 MG 24 hr tablet Commonly known as: Toprol XL Take 1 tablet (25 mg total) by mouth at bedtime.   tirzepatide 12.5 MG/0.5ML Pen Commonly known as: MOUNJARO Inject 12.5 mg into the skin once a week.   Vitamin D (Ergocalciferol) 1.25 MG (50000 UNIT) Caps capsule Commonly known as: DRISDOL TAKE 1 CAPSULE BY MOUTH EVERY 7 DAYS           Objective:   Physical Exam BP 106/74 (BP Location: Left Arm, Patient Position: Sitting, Cuff Size: Normal)   Pulse 74   Temp 98 F (36.7 C) (Oral)   Resp 18   Ht 5\' 11"  (1.803 m)   Wt (!) 304 lb 4 oz (138 kg)   SpO2 98%   BMI 42.43 kg/m  General: Well developed, NAD, BMI noted Neck: No  thyromegaly  HEENT:  Normocephalic . Face symmetric, atraumatic Lungs:  CTA B Normal respiratory effort, no intercostal retractions, no accessory muscle use. Heart: RRR,  no murmur.  Abdomen:  Not distended, soft, non-tender. No rebound or rigidity.   Lower extremities: no pretibial edema bilaterally  Skin: Exposed areas without rash. Not pale. Not jaundice DRE: Normal sphincter tone, no stools, prostate normal Neurologic:  alert & oriented X3.  Speech normal, gait appropriate for age and unassisted Strength symmetric and appropriate for age.  Psych: Cognition and judgment appear intact.  Cooperative with normal attention span and concentration.  Behavior appropriate. No anxious or depressed appearing.     Assessment    Assessment Prediabetes HTN Hyperlipidemia CV: Atrial tachycardia Dx 05/2021 Insomnia: on OTCs Morbid obesity, BMI 46 OSA, CPAP Seasonal allergies Kidney stone (first) 10-2016 Vit D def dx 09/2017 Retina detachment DX 06-2019  PLAN: Here for CPX Atrial flutter: Since the last visit, was Dx w/ Atrial Flutter 05/25/2021.   Typically asx, he is alerted by his wristwatch.  Cardioverted x2. Last visit with cardiology 09/11/2021.  They recommend to continue Eliquis, started Toprol, diltiazem as needed palpitations.  He is contemplating ablation. Diabetes: Seen at the wellness clinic, check A1c HTN: Recently metoprolol was added to  his normal regimen, BP is a slightly low, he denies any symptoms.  No change Hyperlipidemia: Well-controlled on atorvastatin. Vitamin D deficiency: On supplements, labs Morbid obesity: On Wellbutrin and Mounjaro weekly.  Follow-up elsewhere RTC 1 year.  Sooner if needed  In addition to CPX, I addressed all his chronic medical problems including reviewing the chart for a new diagnosis (atrial flutter)  This visit occurred during the SARS-CoV-2 public health emergency.  Safety protocols were in place, including screening questions prior to the visit, additional usage of staff PPE, and extensive cleaning of exam room while observing appropriate contact time as indicated for disinfecting solutions.

## 2021-09-15 NOTE — Telephone Encounter (Signed)
LAST APPOINTMENT DATE: 09/10/21 NEXT APPOINTMENT DATE: 10/01/21   [facility] #[id number] [address], [address] AT [facility] OF [place] [address] Phone: [phone] Fax: [phone]  Patient is requesting a refill of the following medications: Requested Prescriptions   Pending Prescriptions Disp Refills   Vitamin D, Ergocalciferol, (DRISDOL) 1.25 MG (50000 UNIT) CAPS capsule [Pharmacy Med Name: VITAMIN D2 50,000IU (ERGO) CAP RX] 4 capsule 0    Sig: TAKE 1 CAPSULE BY MOUTH EVERY 7 DAYS    Date last filled: 07/09/21 Previously prescribed by [name]  Lab Results  Component Value Date   HGBA1C 6.2 (H) 02/10/2021   HGBA1C 6.3 (H) 09/11/2020   HGBA1C 6.3 (H) 05/29/2020   Lab Results  Component Value Date   LDLCALC 79 02/10/2021   CREATININE 0.98 09/06/2021   Lab Results  Component Value Date   VD25OH 47.1 02/10/2021   VD25OH 57.5 09/18/2020   VD25OH 43.6 05/29/2020    BP Readings from Last 3 Encounters:  09/15/21 106/74  09/11/21 116/72  09/10/21 128/83

## 2021-09-16 ENCOUNTER — Encounter (INDEPENDENT_AMBULATORY_CARE_PROVIDER_SITE_OTHER): Payer: Self-pay

## 2021-09-16 ENCOUNTER — Encounter (INDEPENDENT_AMBULATORY_CARE_PROVIDER_SITE_OTHER): Payer: Self-pay | Admitting: Family Medicine

## 2021-09-16 NOTE — Telephone Encounter (Signed)
Dr.Beasley 

## 2021-10-01 ENCOUNTER — Ambulatory Visit (INDEPENDENT_AMBULATORY_CARE_PROVIDER_SITE_OTHER): Payer: BC Managed Care – PPO | Admitting: Family Medicine

## 2021-10-01 ENCOUNTER — Other Ambulatory Visit: Payer: Self-pay

## 2021-10-01 ENCOUNTER — Encounter (INDEPENDENT_AMBULATORY_CARE_PROVIDER_SITE_OTHER): Payer: Self-pay | Admitting: Family Medicine

## 2021-10-01 VITALS — BP 114/75 | HR 66 | Temp 97.9°F | Ht 71.0 in | Wt 299.0 lb

## 2021-10-01 DIAGNOSIS — R7303 Prediabetes: Secondary | ICD-10-CM

## 2021-10-01 DIAGNOSIS — Z6841 Body Mass Index (BMI) 40.0 and over, adult: Secondary | ICD-10-CM

## 2021-10-01 DIAGNOSIS — I10 Essential (primary) hypertension: Secondary | ICD-10-CM | POA: Diagnosis not present

## 2021-10-01 DIAGNOSIS — F3289 Other specified depressive episodes: Secondary | ICD-10-CM | POA: Diagnosis not present

## 2021-10-01 DIAGNOSIS — Z9189 Other specified personal risk factors, not elsewhere classified: Secondary | ICD-10-CM | POA: Diagnosis not present

## 2021-10-01 MED ORDER — METFORMIN HCL 500 MG PO TABS: ORAL_TABLET | ORAL | 0 refills | Status: DC

## 2021-10-01 MED ORDER — TIRZEPATIDE 12.5 MG/0.5ML ~~LOC~~ SOAJ: 12.5000 mg | SUBCUTANEOUS | 0 refills | Status: AC

## 2021-10-01 MED ORDER — BUPROPION HCL ER (SR) 150 MG PO TB12: 150.0000 mg | ORAL_TABLET | Freq: Every morning | ORAL | 0 refills | Status: DC

## 2021-10-01 NOTE — Progress Notes (Signed)
Chief Complaint:   OBESITY Bryan [name] is here to discuss his progress with his obesity treatment plan along with follow-up of his obesity related diagnoses. Bryan [name] is on the Category 3 Plan and states he is following his eating plan approximately 80% of the time. Bryan [name] states he is on the treadmill for 25 minutes 6 times per week.  Today's visit was #: 41 Starting weight: 326 lbs Starting date: 09/07/2017 Today's weight: 299 lbs Today's date: 10/01/2021 Total lbs lost to date: 27 Total lbs lost since last in-office visit: 2  Interim History: Bryan [name] continues to do well with weight loss. His hunger is mostly controlled. He is exercising regularly both cardio and some strengthening.  Subjective:   1. Essential hypertension Bryan [name]'s blood pressure is well controlled on his medications. He denies chest pain or headaches, or signs of hypotension.  2. Pre-diabetes Bryan [name]'s recent fasting blood sugar was 135. He is working on diet and exercise, and he is doing well with Mounjaro with decreased polyphagia.  3. Other depression with emotional eating Bryan [name]'s mood is stable on Wellbutrin. He notes decreased emotional eating behaviors and cravings.  4. At risk for heart disease Bryan [name] is at a higher than average risk for cardiovascular disease due to obesity.   Assessment/Plan:   1. Essential hypertension Bryan [name] will continue with diet and exercise, and will watch for signs of hypotension as he continues his lifestyle modifications. We will refill losartan 50 mg q daily #30 for 1 month.  2. Pre-diabetes Bryan [name] will continue to work on weight loss, exercise, and decreasing simple carbohydrates to help decrease the risk of diabetes. We will refill Mounjaro and metformin for 1 month.  - metFORMIN (GLUCOPHAGE) 500 MG tablet; TAKE 1 TABLET(500 MG) BY MOUTH THREE TIMES DAILY  Dispense: 90 tablet; Refill: 0 - tirzepatide (MOUNJARO) 12.5 MG/0.5ML Pen; Inject 12.5 mg into the skin  once a week.  Dispense: 2 mL; Refill: 0  3. Other depression with emotional eating Behavior modification techniques were discussed today to help Bryan [name] deal with his emotional/non-hunger eating behaviors. We will refill Wellbutrin SR for 1 month. Orders and follow up as documented in patient record.   - buPROPion (WELLBUTRIN SR) 150 MG 12 hr tablet; Take 1 tablet (150 mg total) by mouth in the morning.  Dispense: 30 tablet; Refill: 0  4. At risk for heart disease Bryan [name] was given approximately 15 minutes of coronary artery disease prevention counseling today. He is 60 y.o. male and has risk factors for heart disease including obesity. We discussed intensive lifestyle modifications today with an emphasis on specific weight loss instructions and strategies.   Repetitive spaced learning was employed today to elicit superior memory formation and behavioral change.  5. Obesity with current BMI 41.7 Bryan [name] is currently in the action stage of change. As such, his goal is to continue with weight loss efforts. He has agreed to the Category 3 Plan.   Exercise goals: As is, consider increasing strengthening.  Behavioral modification strategies: increasing lean protein intake, meal planning and cooking strategies, and holiday eating strategies .  Bryan [name] has agreed to follow-up with our clinic in 3 weeks. He was informed of the importance of frequent follow-up visits to maximize his success with intensive lifestyle modifications for his multiple health conditions.   Objective:   Blood pressure 114/75, pulse 66, temperature 97.9 F (36.6 C), height 5\' 11"  (1.803 m), weight 299 lb (135.6 kg), SpO2 98 %. Body mass index is 41.7 kg/m.  General: Cooperative, alert,  well developed, in no acute distress. HEENT: Conjunctivae and lids unremarkable. Cardiovascular: Regular rhythm.  Lungs: Normal work of breathing. Neurologic: No focal deficits.   Lab Results  Component Value Date   CREATININE 0.98  09/06/2021   BUN 16 09/06/2021   NA 136 09/06/2021   K 3.8 09/06/2021   CL 101 09/06/2021   CO2 25 09/06/2021   Lab Results  Component Value Date   ALT 21 09/15/2021   AST 13 09/15/2021   ALKPHOS 58 03/05/2021   BILITOT 0.2 03/05/2021   Lab Results  Component Value Date   HGBA1C 5.8 09/15/2021   HGBA1C 6.2 (H) 02/10/2021   HGBA1C 6.3 (H) 09/11/2020   HGBA1C 6.3 (H) 05/29/2020   HGBA1C 6.4 (H) 01/10/2020   Lab Results  Component Value Date   INSULIN 22.6 02/10/2021   INSULIN 20.9 05/29/2020   INSULIN 24.0 01/10/2020   INSULIN 21.9 11/17/2018   INSULIN 24.0 08/18/2018   Lab Results  Component Value Date   TSH 1.600 05/25/2021   Lab Results  Component Value Date   CHOL 157 02/10/2021   HDL 41 02/10/2021   LDLCALC 79 02/10/2021   LDLDIRECT 136.0 11/16/2016   TRIG 222 (H) 02/10/2021   CHOLHDL 4 07/07/2019   Lab Results  Component Value Date   VD25OH 55.45 09/15/2021   VD25OH 47.1 02/10/2021   VD25OH 57.5 09/18/2020   Lab Results  Component Value Date   WBC 11.8 (H) 09/06/2021   HGB 16.9 09/06/2021   HCT 49.9 09/06/2021   MCV 84.0 09/06/2021   PLT 380 09/06/2021   No results found for: IRON, TIBC, FERRITIN  Attestation Statements:   Reviewed by clinician on day of visit: allergies, medications, problem list, medical history, surgical history, family history, social history, and previous encounter notes.   I, Trixie Dredge, am acting as transcriptionist for Dennard Nip, MD.  I have reviewed the above documentation for accuracy and completeness, and I agree with the above. -  Dennard Nip, MD

## 2021-10-06 MED ORDER — LOSARTAN POTASSIUM 50 MG PO TABS: 50.0000 mg | ORAL_TABLET | Freq: Every day | ORAL | 0 refills | Status: DC

## 2021-10-07 ENCOUNTER — Other Ambulatory Visit (INDEPENDENT_AMBULATORY_CARE_PROVIDER_SITE_OTHER): Payer: Self-pay | Admitting: Family Medicine

## 2021-10-07 DIAGNOSIS — I10 Essential (primary) hypertension: Secondary | ICD-10-CM

## 2021-10-07 NOTE — Telephone Encounter (Signed)
LAST APPOINTMENT DATE: 10/01/21 NEXT APPOINTMENT DATE: 10/22/21   [facility] #[id number] [address], [address] AT [facility] OF [place] [address] Phone: [phone] Fax: [phone]  Patient is requesting a refill of the following medications: Requested Prescriptions   Pending Prescriptions Disp Refills   hydrochlorothiazide (HYDRODIURIL) 25 MG tablet [Pharmacy Med Name: HYDROCHLOROTHIAZIDE 25MG  TABLETS] 90 tablet     Sig: TAKE 1 TABLET(25 MG) BY MOUTH DAILY    Date last filled: 09/10/21 Previously prescribed by [name]  Lab Results  Component Value Date   HGBA1C 5.8 09/15/2021   HGBA1C 6.2 (H) 02/10/2021   HGBA1C 6.3 (H) 09/11/2020   Lab Results  Component Value Date   LDLCALC 79 02/10/2021   CREATININE 0.98 09/06/2021   Lab Results  Component Value Date   VD25OH 55.45 09/15/2021   VD25OH 47.1 02/10/2021   VD25OH 57.5 09/18/2020    BP Readings from Last 3 Encounters:  10/01/21 114/75  09/15/21 106/74  09/11/21 116/72

## 2021-10-14 ENCOUNTER — Ambulatory Visit (INDEPENDENT_AMBULATORY_CARE_PROVIDER_SITE_OTHER): Payer: BC Managed Care – PPO

## 2021-10-14 ENCOUNTER — Other Ambulatory Visit: Payer: Self-pay

## 2021-10-14 ENCOUNTER — Encounter: Payer: Self-pay | Admitting: Cardiology

## 2021-10-14 ENCOUNTER — Ambulatory Visit: Payer: BC Managed Care – PPO | Admitting: Cardiology

## 2021-10-14 VITALS — BP 116/64 | HR 74 | Ht 71.0 in | Wt 304.0 lb

## 2021-10-14 DIAGNOSIS — I4892 Unspecified atrial flutter: Secondary | ICD-10-CM | POA: Diagnosis not present

## 2021-10-14 DIAGNOSIS — I4510 Unspecified right bundle-branch block: Secondary | ICD-10-CM | POA: Diagnosis not present

## 2021-10-14 DIAGNOSIS — I10 Essential (primary) hypertension: Secondary | ICD-10-CM

## 2021-10-14 DIAGNOSIS — I4891 Unspecified atrial fibrillation: Secondary | ICD-10-CM

## 2021-10-14 DIAGNOSIS — G4733 Obstructive sleep apnea (adult) (pediatric): Secondary | ICD-10-CM | POA: Diagnosis not present

## 2021-10-14 NOTE — Patient Instructions (Signed)
Medication Instructions:  Your physician recommends that you continue on your current medications as directed. Please refer to the Current Medication list given to you today. *If you need a refill on your cardiac medications before your next appointment, please call your pharmacy*  Lab Work: None ordered. If you have labs (blood work) drawn today and your tests are completely normal, you will receive your results only by: [place] (if you have MyChart) OR A paper copy in the mail If you have any lab test that is abnormal or we need to change your treatment, we will call you to review the results.  Testing/Procedures: Your physician has recommended that you have an ablation. Catheter ablation is a medical procedure used to treat some cardiac arrhythmias (irregular heartbeats). During catheter ablation, a long, thin, flexible tube is put into a blood vessel in your groin (upper thigh), or neck. This tube is called an ablation catheter. It is then guided to your heart through the blood vessel. Radio frequency waves destroy small areas of heart tissue where abnormal heartbeats may cause an arrhythmia to start. Please see the instruction sheet given to you today.  Your physician has requested that you have cardiac CT. Cardiac computed tomography (CT) is a painless test that uses an x-ray machine to take clear, detailed pictures of your heart.   Your physician has recommended that you wear a holter monitor. Holter monitors are medical devices that record the heart's electrical activity. Doctors most often use these monitors to diagnose arrhythmias. Arrhythmias are problems with the speed or rhythm of the heartbeat. The monitor is a small, portable device. You can wear one while you do your normal daily activities. This is usually used to diagnose what is causing palpitations/syncope (passing out).  You will wear a 14 day ZIO monitor    Follow-Up: At [facility], you and your health needs  are our priority.  As part of our continuing mission to provide you with exceptional heart care, we have created designated Provider Care Teams.  These Care Teams include your primary Cardiologist (physician) and Advanced Practice Providers (APPs -  Physician Assistants and Nurse Practitioners) who all work together to provide you with the care you need, when you need it.  Your next appointment:    SEE INSTRUCTION LETTER  Your physician has recommended that you wear a Zio monitor.   This monitor is a medical device that records the heart's electrical activity. Doctors most often use these monitors to diagnose arrhythmias. Arrhythmias are problems with the speed or rhythm of the heartbeat. The monitor is a small device applied to your chest. You can wear one while you do your normal daily activities. While wearing this monitor if you have any symptoms to push the button and record what you felt. Once you have worn this monitor for the period of time provider prescribed (Usually 14 days), you will return the monitor device in the postage paid box. Once it is returned they will download the data collected and provide [place] with a report which the provider will then review and we will call you with those results. Important tips:  Avoid showering during the first 24 hours of wearing the monitor. Avoid excessive sweating to help maximize wear time. Do not submerge the device, no hot tubs, and no swimming pools. Keep any lotions or oils away from the patch. After 24 hours you may shower with the patch on. Take brief showers with your back facing the shower head.  Do  not remove patch once it has been placed because that will interrupt data and decrease adhesive wear time. Push the button when you have any symptoms and write down what you were feeling. Once you have completed wearing your monitor, remove and place into box which has postage paid and place in your outgoing mailbox.  If for some reason you have  misplaced your box then call our office and we can provide another box and/or mail it off for you.     Cardiac Ablation Cardiac ablation is a procedure to destroy, or ablate, a small amount of heart tissue in very specific places. The heart has many electrical connections. Sometimes these connections are abnormal and can cause the heart to beat very fast or irregularly. Ablating some of the areas that cause problems can improve the heart's rhythm or return it to normal. Ablation may be done for people who: Have Wolff-Parkinson-White syndrome. Have fast heart rhythms (tachycardia). Have taken medicines for an abnormal heart rhythm (arrhythmia) that were not effective or caused side effects. Have a high-risk heartbeat that may be life-threatening. During the procedure, a small incision is made in the neck or the groin, and a long, thin tube (catheter) is inserted into the incision and moved to the heart. Small devices (electrodes) on the tip of the catheter will send out electrical currents. A type of X-ray (fluoroscopy) will be used to help guide the catheter and to provide images of the heart. Tell a health care provider about: Any allergies you have. All medicines you are taking, including vitamins, herbs, eye drops, creams, and over-the-counter medicines. Any problems you or family members have had with anesthetic medicines. Any blood disorders you have. Any surgeries you have had. Any medical conditions you have, such as kidney failure. Whether you are pregnant or may be pregnant. What are the risks? Generally, this is a safe procedure. However, problems may occur, including: Infection. Bruising and bleeding at the catheter insertion site. Bleeding into the chest, especially into the sac that surrounds the heart. This is a serious complication. Stroke or blood clots. Damage to nearby structures or organs. Allergic reaction to medicines or dyes. Need for a permanent pacemaker if the  normal electrical system is damaged. A pacemaker is a small computer that sends electrical signals to the heart and helps your heart beat normally. The procedure not being fully effective. This may not be recognized until months later. Repeat ablation procedures are sometimes done. What happens before the procedure? Medicines Ask your health care provider about: Changing or stopping your regular medicines. This is especially important if you are taking diabetes medicines or blood thinners. Taking medicines such as aspirin and ibuprofen. These medicines can thin your blood. Do not take these medicines unless your health care provider tells you to take them. Taking over-the-counter medicines, vitamins, herbs, and supplements. General instructions Follow instructions from your health care provider about eating or drinking restrictions. Plan to have someone take you home from the hospital or clinic. If you will be going home right after the procedure, plan to have someone with you for 24 hours. Ask your health care provider what steps will be taken to prevent infection. What happens during the procedure?  An IV will be inserted into one of your veins. You will be given a medicine to help you relax (sedative). The skin on your neck or groin will be numbed. An incision will be made in your neck or your groin. A needle will be inserted through  the incision and into a large vein in your neck or groin. A catheter will be inserted into the needle and moved to your heart. Dye may be injected through the catheter to help your surgeon see the area of the heart that needs treatment. Electrical currents will be sent from the catheter to ablate heart tissue in desired areas. There are three types of energy that may be used to do this: Heat (radiofrequency energy). Laser energy. Extreme cold (cryoablation). When the tissue has been ablated, the catheter will be removed. Pressure will be held on the  insertion area to prevent a lot of bleeding. A bandage (dressing) will be placed over the insertion area. The exact procedure may vary among health care providers and hospitals. What happens after the procedure? Your blood pressure, heart rate, breathing rate, and blood oxygen level will be monitored until you leave the hospital or clinic. Your insertion area will be monitored for bleeding. You will need to lie still for a few hours to ensure that you do not bleed from the insertion area. Do not drive for 24 hours or as long as told by your health care provider. Summary Cardiac ablation is a procedure to destroy, or ablate, a small amount of heart tissue using an electrical current. This procedure can improve the heart rhythm or return it to normal. Tell your health care provider about any medical conditions you may have and all medicines you are taking to treat them. This is a safe procedure, but problems may occur. Problems may include infection, bruising, damage to nearby organs or structures, or allergic reactions to medicines. Follow your health care provider's instructions about eating and drinking before the procedure. You may also be told to change or stop some of your medicines. After the procedure, do not drive for 24 hours or as long as told by your health care provider. This information is not intended to replace advice given to you by your health care provider. Make sure you discuss any questions you have with your health care provider. Document Revised: 10/02/2019 Document Reviewed: 10/02/2019 Elsevier Patient Education  [place].

## 2021-10-14 NOTE — Progress Notes (Signed)
Electrophysiology Office Note:    Date:  10/14/2021   ID:  Bryan [name], DOB 08-Jun-1961, MRN [id number]  PCP:  Colon Branch, MD  [name] Cardiologist:  None  [facility] Electrophysiologist:  None   Referring MD: Oliver Barre, PA   Chief Complaint: Atrial flutter  History of Present Illness:    Bryan [name] is a 60 y.o. male who presents for an evaluation of atrial flutter at the request of Adline Peals, PA-C. Their medical history includes obesity, hyperlipidemia, sleep apnea on CPAP.  The patient last saw Adline Peals September 11, 2021.  He first was diagnosed with atrial flutter in June 2022 after his watch alerted him to a rapid heart rate.  He did experience fatigue during this episode.  In the emergency department, ECG showed atrial flutter with 2-1 AV conduction and he underwent a successful cardioversion.  He is maintained on Eliquis for stroke prophylaxis.  He had another presentation to the emergency department on September 06, 2021 and had another cardioversion.  At the appointment with Audry Pili, he reported a 21 pound weight loss.  He continues to work with the weight loss clinic and is reported a steady weight loss to me today.  He is interested in a rhythm control strategy that avoids long-term antiarrhythmic drug therapy.     Past Medical History:  Diagnosis Date   Allergy    Dental crowns present    Hyperlipidemia    Lower back pain    Nephrolithiasis 2017   Prediabetes 09/04/2014   Seasonal allergies    Sebaceous cyst 04/2013   upper back   Sleep apnea    uses CPAP nightly   Urolithiasis 10/2016    Past Surgical History:  Procedure Laterality Date   ADENOIDECTOMY     as a child   bone marrow donor     under anesthesia   CHOLECYSTECTOMY  01/12/2012   Procedure: LAPAROSCOPIC CHOLECYSTECTOMY WITH INTRAOPERATIVE CHOLANGIOGRAM;  Surgeon: [name], MD;  Location: [facility];  Service: General;  Laterality: N/A;   COLONOSCOPY     CYST EXCISION      cyst removal from back   EAR CYST EXCISION N/A 05/04/2013   Procedure: Excision subcutaneous mass posterior neck;  Surgeon: [name], MD;  Location: [place];  Service: General;  Laterality: N/A;   POLYPECTOMY     wisdom teeth      Current Medications: Current Meds  Medication Sig   atorvastatin (LIPITOR) 10 MG tablet Take 1 tablet (10 mg total) by mouth daily.   azelastine (ASTELIN) 0.1 % nasal spray Place 2 sprays into both nostrils at bedtime as needed for rhinitis. Use in each nostril as directed   buPROPion (WELLBUTRIN SR) 150 MG 12 hr tablet Take 1 tablet (150 mg total) by mouth in the morning.   CO ENZYME Q-10 PO Take by mouth daily.   diltiazem (CARDIZEM) 30 MG tablet Take 1 - 2 tablets by mouth every 6 hours as needed for heart rate greater than 130   fish oil-omega-3 fatty acids 1000 MG capsule Take 1 g by mouth daily.   hydrochlorothiazide (HYDRODIURIL) 25 MG tablet TAKE 1 TABLET(25 MG) BY MOUTH DAILY   Insulin Pen Needle (BD PEN NEEDLE NANO U/F) 32G X 4 MM MISC 1 each by Does not apply route daily. Use one pen needle once daily to inject Saxenda.   losartan (COZAAR) 50 MG tablet Take 1 tablet (50 mg total) by mouth daily.  Melatonin 1 MG CAPS Take 1 mg by mouth at bedtime as needed.    metFORMIN (GLUCOPHAGE) 500 MG tablet TAKE 1 TABLET(500 MG) BY MOUTH THREE TIMES DAILY   metoprolol succinate (TOPROL XL) 25 MG 24 hr tablet Take 1 tablet (25 mg total) by mouth at bedtime.   Multiple Vitamins-Minerals (CENTRUM PO) Take 1 tablet by mouth daily.   tirzepatide (MOUNJARO) 12.5 MG/0.5ML Pen Inject 12.5 mg into the skin once a week.   Vitamin D, Ergocalciferol, (DRISDOL) 1.25 MG (50000 UNIT) CAPS capsule TAKE 1 CAPSULE BY MOUTH EVERY 7 DAYS     Allergies:   Lisinopril   Social History   Socioeconomic History   Marital status: Married    Spouse name: Patty   Number of children: 3   Years of education: Not on file   Highest education level: Not on file   Occupational History   Occupation:  manufactoring rep, A/C units     Employer: [employer]  Tobacco Use   Smoking status: Never   Smokeless tobacco: Never  Vaping Use   Vaping Use: Never used  Substance and Sexual Activity   Alcohol use: Yes    Alcohol/week: 3.0 - 4.0 standard drinks    Types: 3 - 4 Glasses of wine per week    Comment: 1 glass wine every other day   Drug use: No   Sexual activity: Not on file  Other Topics Concern   Not on file  Social History Narrative   3 children, 1 at home,  2 in college    Wife w/ breast ca dx 2016, doing well   she developed SZs 2017, sees Dr Jannifer Franklin          Social Determinants of Health   Financial Resource Strain: Not on file  Food Insecurity: Not on file  Transportation Needs: Not on file  Physical Activity: Not on file  Stress: Not on file  Social Connections: Not on file     Family History: The patient's family history includes Cancer in his brother and paternal grandmother; Cancer (age of onset: 30) in his father; Colon cancer (age of onset: 52) in his maternal aunt; Diabetes in his brother; Hyperlipidemia in his mother; Hypertension in his mother; Kidney disease in his father; Obesity in his mother; Ovarian cancer in his mother; Sleep apnea in his father. There is no history of Prostate cancer, Esophageal cancer, Rectal cancer, Stomach cancer, CAD, or Colon polyps.  ROS:   Please see the history of present illness.    All other systems reviewed and are negative.  EKGs/Labs/Other Studies Reviewed:    The following studies were reviewed today:  June 18, 2021 echo Left ventricular function normal, 65% Right ventricular function normal No MR  September 06, 2021 EKG Atrial flutter with a right bundle aberrancy 2-1 AV conduction    September 11, 2021 EKG shows sinus rhythm, right bundle branch block, borderline left anterior fascicular block    EKG:  The ekg ordered today demonstrates sinus rhythm, right bundle  branch block, left anterior fascicular block   Recent Labs: 05/25/2021: TSH 1.600 09/06/2021: BUN 16; Creatinine, Ser 0.98; Hemoglobin 16.9; Magnesium 2.1; Platelets 380; Potassium 3.8; Sodium 136 09/15/2021: ALT 21  Recent Lipid Panel    Component Value Date/Time   CHOL 157 02/10/2021 1416   TRIG 222 (H) 02/10/2021 1416   HDL 41 02/10/2021 1416   CHOLHDL 4 07/07/2019 0835   VLDL 38.2 07/07/2019 0835   LDLCALC 79 02/10/2021 1416  LDLDIRECT 136.0 11/16/2016 0902    Physical Exam:    VS:  BP 116/64   Pulse 74   Ht 5\' 11"  (1.803 m)   Wt (!) 304 lb (137.9 kg)   SpO2 96%   BMI 42.40 kg/m     Wt Readings from Last 3 Encounters:  10/14/21 (!) 304 lb (137.9 kg)  10/01/21 299 lb (135.6 kg)  09/15/21 (!) 304 lb 4 oz (138 kg)     GEN:  Well nourished, well developed in no acute distress.  Obese HEENT: Normal NECK: No JVD; No carotid bruits LYMPHATICS: No lymphadenopathy CARDIAC: RRR, no murmurs, rubs, gallops RESPIRATORY:  Clear to auscultation without rales, wheezing or rhonchi  ABDOMEN: Soft, non-tender, non-distended MUSCULOSKELETAL:  No edema; No deformity  SKIN: Warm and dry NEUROLOGIC:  Alert and oriented x 3 PSYCHIATRIC:  Normal affect       ASSESSMENT:    1. Atrial flutter, unspecified type ([place])   2. Essential hypertension   3. Right bundle branch block   4. Obstructive sleep apnea   5. Atrial fibrillation, unspecified type ([place])    PLAN:    In order of problems listed above:   #Atrial flutter, typical Symptomatic.  Frequently with 2 to 1 conduction.  Has had multiple episodes requiring cardioversion.  Is interested in a rhythm control strategy that avoids long-term use of antiarrhythmic drugs.  No evidence of atrial fibrillation thus far.  I discussed antiarrhythmic drug therapy and catheter ablation in detail with the patient during today's visit.  We discussed the efficacy, procedural details, risks and recovery.  He is interested in pursuing catheter  ablation.  I will plan on a 2-week ZIO monitor to assess for any atrial fibrillation.  We discussed how this would change the ablation strategy.  If I do not detect any atrial fibrillation, would plan for a CTI ablation only.  If A. fib is detected, plan for PVI plus CTI.  Risk, benefits, and alternatives to EP study and radiofrequency ablation for afib were also discussed in detail today. These risks include but are not limited to stroke, bleeding, vascular damage, tamponade, perforation, damage to the esophagus, lungs, and other structures, pulmonary vein stenosis, worsening renal function, and death. The patient understands these risk and wishes to proceed.  We will therefore proceed with catheter ablation at the next available time.  Carto, ICE, anesthesia are requested for the procedure.  Will also obtain CT PV protocol prior to the procedure to exclude LAA thrombus and further evaluate atrial anatomy.   #Hypertension Controlled Continue current regimen  #Obstructive sleep apnea Compliant with CPAP therapy    Total time spent with patient today 65 minutes. This includes reviewing records, evaluating the patient and coordinating care.  Medication Adjustments/Labs and Tests Ordered: Current medicines are reviewed at length with the patient today.  Concerns regarding medicines are outlined above.  Orders Placed This Encounter  Procedures   CT CARDIAC MORPH/PULM VEIN W/CM&W/O CA SCORE   Basic Metabolic Panel (BMET)   CBC w/Diff   LONG TERM MONITOR (3-14 DAYS)   EKG 12-Lead    No orders of the defined types were placed in this encounter.    Signed, Hilton Cork. Quentin Ore, MD, [facility], [facility] 10/14/2021 2:26 PM    Electrophysiology [facility]

## 2021-10-14 NOTE — Progress Notes (Unsigned)
Enrolled patient for a 14 day Zio XT  monitor to be mailed to patients home  °

## 2021-10-17 DIAGNOSIS — I4892 Unspecified atrial flutter: Secondary | ICD-10-CM | POA: Diagnosis not present

## 2021-10-20 ENCOUNTER — Encounter: Payer: Self-pay | Admitting: Family Medicine

## 2021-10-20 ENCOUNTER — Other Ambulatory Visit (HOSPITAL_BASED_OUTPATIENT_CLINIC_OR_DEPARTMENT_OTHER): Payer: Self-pay

## 2021-10-20 ENCOUNTER — Encounter: Payer: Self-pay | Admitting: Internal Medicine

## 2021-10-20 ENCOUNTER — Telehealth (INDEPENDENT_AMBULATORY_CARE_PROVIDER_SITE_OTHER): Payer: BC Managed Care – PPO | Admitting: Family Medicine

## 2021-10-20 ENCOUNTER — Other Ambulatory Visit: Payer: Self-pay | Admitting: Family Medicine

## 2021-10-20 DIAGNOSIS — U071 COVID-19: Secondary | ICD-10-CM

## 2021-10-20 MED ORDER — MOLNUPIRAVIR EUA 200MG CAPSULE: 4.0000 | ORAL_CAPSULE | Freq: Two times a day (BID) | ORAL | 0 refills | Status: AC

## 2021-10-20 MED ORDER — PROMETHAZINE-CODEINE 6.25-10 MG/5ML PO SYRP
5.0000 mL | ORAL_SOLUTION | Freq: Four times a day (QID) | ORAL | 0 refills | Status: DC | PRN
Start: 2021-10-20 — End: 2022-09-22
  Filled 2021-10-20: qty 120, 6d supply, fill #0

## 2021-10-20 MED ORDER — PROMETHAZINE-CODEINE 6.25-10 MG/5ML PO SYRP: 5.0000 mL | ORAL_SOLUTION | Freq: Four times a day (QID) | ORAL | 0 refills | Status: DC | PRN

## 2021-10-20 NOTE — Progress Notes (Signed)
Chief Complaint  Patient presents with   Covid Positive    Tested positive 10/20/21 Symptoms started on Friday 10/17/21 Congestion Sore throat.    [name] here for URI complaints. Due to COVID-19 pandemic, we are interacting via web portal for an electronic face-to-face visit. I verified patient's ID using 2 identifiers. Patient agreed to proceed with visit via this method. Patient is at home, I am at office. Patient and I are present for visit.   Duration: 3 days  Associated symptoms: low grade fever that resolved, ear pain, sinus congestion, sore throat, and coughing. Denies: sinus pain, itchy watery eyes, ear drainage, wheezing, shortness of breath, myalgia, and loss of taste/smell Treatment to date: Mucinex, OTC cough syrup Sick contacts: Yes; Might have had a co-worker who infected him.  Tested + for covid today.  Had JJ vaccine w a Moderna booster.   Past Medical History:  Diagnosis Date   Allergy    Dental crowns present    Hyperlipidemia    Lower back pain    Nephrolithiasis 2017   Prediabetes 09/04/2014   Seasonal allergies    Sebaceous cyst 04/2013   upper back   Sleep apnea    uses CPAP nightly   Urolithiasis 10/2016    Objective No conversational dyspnea Age appropriate judgment and insight Nml affect and mood  COVID-19 - Plan: promethazine-codeine (PHENERGAN WITH CODEINE) 6.25-10 MG/5ML syrup, molnupiravir EUA (LAGEVRIO) 200 mg CAPS capsule  Given co-morbidities, will tx w antiviral. Syrup for both cough and soothing throat. Warnings about sedation verbalized.  Continue to push fluids, practice good hand hygiene, cover mouth when coughing. F/u prn. If starting to experience irreplaceable fluid loss, shaking, or shortness of breath, seek immediate care. Pt voiced understanding and agreement to the plan.  [place], DO 10/20/21 8:57 AM

## 2021-10-22 ENCOUNTER — Encounter (INDEPENDENT_AMBULATORY_CARE_PROVIDER_SITE_OTHER): Payer: Self-pay | Admitting: Family Medicine

## 2021-10-22 ENCOUNTER — Telehealth (INDEPENDENT_AMBULATORY_CARE_PROVIDER_SITE_OTHER): Payer: BC Managed Care – PPO | Admitting: Family Medicine

## 2021-10-22 VITALS — Ht 71.0 in | Wt 292.0 lb

## 2021-10-22 DIAGNOSIS — Z6841 Body Mass Index (BMI) 40.0 and over, adult: Secondary | ICD-10-CM

## 2021-10-22 DIAGNOSIS — R7303 Prediabetes: Secondary | ICD-10-CM

## 2021-10-22 DIAGNOSIS — F3289 Other specified depressive episodes: Secondary | ICD-10-CM

## 2021-10-22 DIAGNOSIS — E559 Vitamin D deficiency, unspecified: Secondary | ICD-10-CM | POA: Diagnosis not present

## 2021-10-22 DIAGNOSIS — I10 Essential (primary) hypertension: Secondary | ICD-10-CM | POA: Diagnosis not present

## 2021-10-22 MED ORDER — METFORMIN HCL 500 MG PO TABS: ORAL_TABLET | ORAL | 0 refills | Status: DC

## 2021-10-22 MED ORDER — LOSARTAN POTASSIUM 50 MG PO TABS: 50.0000 mg | ORAL_TABLET | Freq: Every day | ORAL | 0 refills | Status: DC

## 2021-10-22 MED ORDER — HYDROCHLOROTHIAZIDE 25 MG PO TABS: ORAL_TABLET | ORAL | 0 refills | Status: DC

## 2021-10-22 MED ORDER — BUPROPION HCL ER (SR) 150 MG PO TB12: 150.0000 mg | ORAL_TABLET | Freq: Every morning | ORAL | 0 refills | Status: DC

## 2021-10-22 MED ORDER — VITAMIN D (ERGOCALCIFEROL) 1.25 MG (50000 UNIT) PO CAPS: 50000.0000 [IU] | ORAL_CAPSULE | ORAL | 0 refills | Status: DC

## 2021-10-22 NOTE — Progress Notes (Signed)
TeleHealth Visit:  Due to the COVID-19 pandemic, this visit was completed with telemedicine (audio/video) technology to reduce patient and provider exposure as well as to preserve personal protective equipment.   Asad has verbally consented to this TeleHealth visit. The patient is located at home, the provider is located at the [facility] office. The participants in this visit include the listed provider and patient. The visit was conducted today via MyChart video.   Chief Complaint: OBESITY Tracker is here to discuss his progress with his obesity treatment plan along with follow-up of his obesity related diagnoses. Laron is on the Category 3 Plan and states he is following his eating plan approximately 80% of the time. Airon states he is walking and on the treadmill for 30 minutes 7 times per week.  Today's visit was #: 13 Starting weight: 326 lbs Starting date: 09/07/2017  Interim History: Samael is at home recovering from COVID-19, and his visit was changed to virtual. He continues to do well with  weight loss and despite being sick he is doing well with following his plan. His hunger is controlled. He denies loss of taste or smell.  Subjective:   1. Pre-diabetes Mattix is stable on metformin and Mounjaro. He notes decreased polyphagia, and no nausea or vomiting.  2. Vitamin D deficiency Daimion is stable on Vit D, and he denies nausea, vomiting, or muscle weakness.  3. Essential hypertension Lantz is stable on his medications, and with diet and exercise. No side effects were noted.  4. Other depression with emotional eating Cougar is doing well with his medications. He notes decreased emotional eating behaviors, and his mood is appropriate.  Assessment/Plan:   1. Pre-diabetes Rion will continue [facility] as is, and we will refill metformin for 1 month. He will continue with diet, exercise, and decreasing simple carbohydrates to help decrease the  risk of diabetes.   - metFORMIN (GLUCOPHAGE) 500 MG tablet; TAKE 1 TABLET(500 MG) BY MOUTH THREE TIMES DAILY  Dispense: 30 tablet; Refill: 0  2. Vitamin D deficiency Low Vitamin D level contributes to fatigue and are associated with obesity, breast, and colon cancer. [place] will continue prescription Vitamin D 50,000 IU every week and we will refill for 1 month. He will follow-up for routine testing of Vitamin D, at least 2-3 times per year to avoid over-replacement.  - Vitamin D, Ergocalciferol, (DRISDOL) 1.25 MG (50000 UNIT) CAPS capsule; Take 1 capsule (50,000 Units total) by mouth every 7 (seven) days.  Dispense: 4 capsule; Refill: 0  3. Essential hypertension Zander will continue his medications, and we will refill losartan and hydrochlorothiazide for 1 month. He will continue with diet and exercise, and will watch for signs of hypotension as he continues his lifestyle modifications.  - losartan (COZAAR) 50 MG tablet; Take 1 tablet (50 mg total) by mouth daily.  Dispense: 30 tablet; Refill: 0 - hydrochlorothiazide (HYDRODIURIL) 25 MG tablet; TAKE 1 TABLET(25 MG) BY MOUTH DAILY  Dispense: 30 tablet; Refill: 0  4. Other depression with emotional eating Behavior modification techniques were discussed today to help Ivon deal with his emotional/non-hunger eating behaviors. We will refill Wellbutrin SR for 1 month. Orders and follow up as documented in patient record.   - buPROPion (WELLBUTRIN SR) 150 MG 12 hr tablet; Take 1 tablet (150 mg total) by mouth in the morning.  Dispense: 30 tablet; Refill: 0  5. Obesity with current BMI 41.42 Dow is currently in the action stage of change. As such, his  goal is to continue with weight loss efforts. He has agreed to the Category 3 Plan.   Exercise goals: As is.  Behavioral modification strategies: increasing lean protein intake.  Simone has agreed to follow-up with our clinic in 3 to 4 weeks. He was informed of the importance of frequent  follow-up visits to maximize his success with intensive lifestyle modifications for his multiple health conditions.  Objective:   VITALS: Per patient if applicable, see vitals. GENERAL: Alert and in no acute distress. CARDIOPULMONARY: No increased WOB. Speaking in clear sentences.  PSYCH: Pleasant and cooperative. Speech normal rate and rhythm. Affect is appropriate. Insight and judgement are appropriate. Attention is focused, linear, and appropriate.  NEURO: Oriented as arrived to appointment on time with no prompting.   Lab Results  Component Value Date   CREATININE 0.98 09/06/2021   BUN 16 09/06/2021   NA 136 09/06/2021   K 3.8 09/06/2021   CL 101 09/06/2021   CO2 25 09/06/2021   Lab Results  Component Value Date   ALT 21 09/15/2021   AST 13 09/15/2021   ALKPHOS 58 03/05/2021   BILITOT 0.2 03/05/2021   Lab Results  Component Value Date   HGBA1C 5.8 09/15/2021   HGBA1C 6.2 (H) 02/10/2021   HGBA1C 6.3 (H) 09/11/2020   HGBA1C 6.3 (H) 05/29/2020   HGBA1C 6.4 (H) 01/10/2020   Lab Results  Component Value Date   INSULIN 22.6 02/10/2021   INSULIN 20.9 05/29/2020   INSULIN 24.0 01/10/2020   INSULIN 21.9 11/17/2018   INSULIN 24.0 08/18/2018   Lab Results  Component Value Date   TSH 1.600 05/25/2021   Lab Results  Component Value Date   CHOL 157 02/10/2021   HDL 41 02/10/2021   LDLCALC 79 02/10/2021   LDLDIRECT 136.0 11/16/2016   TRIG 222 (H) 02/10/2021   CHOLHDL 4 07/07/2019   Lab Results  Component Value Date   VD25OH 55.45 09/15/2021   VD25OH 47.1 02/10/2021   VD25OH 57.5 09/18/2020   Lab Results  Component Value Date   WBC 11.8 (H) 09/06/2021   HGB 16.9 09/06/2021   HCT 49.9 09/06/2021   MCV 84.0 09/06/2021   PLT 380 09/06/2021   No results found for: IRON, TIBC, FERRITIN  Attestation Statements:   Reviewed by clinician on day of visit: allergies, medications, problem list, medical history, surgical history, family history, social history, and  previous encounter notes.   I, Trixie Dredge, am acting as transcriptionist for Dennard Nip, MD.  I have reviewed the above documentation for accuracy and completeness, and I agree with the above. - Dennard Nip, MD

## 2021-10-24 ENCOUNTER — Ambulatory Visit: Payer: BC Managed Care – PPO | Admitting: Psychologist

## 2021-11-02 ENCOUNTER — Encounter: Payer: Self-pay | Admitting: Cardiology

## 2021-11-03 MED ORDER — APIXABAN 5 MG PO TABS: 5.0000 mg | ORAL_TABLET | Freq: Two times a day (BID) | ORAL | 1 refills | Status: DC

## 2021-11-12 ENCOUNTER — Encounter (INDEPENDENT_AMBULATORY_CARE_PROVIDER_SITE_OTHER): Payer: Self-pay | Admitting: Family Medicine

## 2021-11-12 ENCOUNTER — Ambulatory Visit (INDEPENDENT_AMBULATORY_CARE_PROVIDER_SITE_OTHER): Payer: BC Managed Care – PPO | Admitting: Family Medicine

## 2021-11-12 ENCOUNTER — Other Ambulatory Visit: Payer: Self-pay

## 2021-11-12 VITALS — BP 113/73 | HR 69 | Temp 97.9°F | Ht 71.0 in | Wt 294.0 lb

## 2021-11-12 DIAGNOSIS — R7303 Prediabetes: Secondary | ICD-10-CM | POA: Diagnosis not present

## 2021-11-12 DIAGNOSIS — F3289 Other specified depressive episodes: Secondary | ICD-10-CM

## 2021-11-12 DIAGNOSIS — Z9189 Other specified personal risk factors, not elsewhere classified: Secondary | ICD-10-CM | POA: Diagnosis not present

## 2021-11-12 DIAGNOSIS — Z6841 Body Mass Index (BMI) 40.0 and over, adult: Secondary | ICD-10-CM

## 2021-11-12 DIAGNOSIS — R632 Polyphagia: Secondary | ICD-10-CM | POA: Diagnosis not present

## 2021-11-12 DIAGNOSIS — E559 Vitamin D deficiency, unspecified: Secondary | ICD-10-CM | POA: Diagnosis not present

## 2021-11-12 DIAGNOSIS — E66813 Obesity, class 3: Secondary | ICD-10-CM

## 2021-11-12 MED ORDER — METFORMIN HCL 500 MG PO TABS: ORAL_TABLET | ORAL | 0 refills | Status: DC

## 2021-11-12 MED ORDER — VITAMIN D (ERGOCALCIFEROL) 1.25 MG (50000 UNIT) PO CAPS: 50000.0000 [IU] | ORAL_CAPSULE | ORAL | 0 refills | Status: DC

## 2021-11-12 MED ORDER — BUPROPION HCL ER (SR) 150 MG PO TB12: 150.0000 mg | ORAL_TABLET | Freq: Every morning | ORAL | 0 refills | Status: DC

## 2021-11-12 MED ORDER — TIRZEPATIDE 15 MG/0.5ML ~~LOC~~ SOAJ: 15.0000 mg | SUBCUTANEOUS | 0 refills | Status: DC

## 2021-11-13 ENCOUNTER — Other Ambulatory Visit (INDEPENDENT_AMBULATORY_CARE_PROVIDER_SITE_OTHER): Payer: Self-pay | Admitting: Family Medicine

## 2021-11-13 DIAGNOSIS — R7303 Prediabetes: Secondary | ICD-10-CM

## 2021-11-13 NOTE — Progress Notes (Signed)
Chief Complaint:   OBESITY Bryan [name] is here to discuss his progress with his obesity treatment plan along with follow-up of his obesity related diagnoses. Bryan [name] is on the Category 3 Plan and states he is following his eating plan approximately 75-80% of the time. Bryan [name] states he is walking for 30 minutes 2-3 times per week.  Today's visit was #: 70 Starting weight: 326 lbs Starting date: 09/08/2017 Today's weight: 294 lbs Today's date: 11/12/2021 Total lbs lost to date: 32 Total lbs lost since last in-office visit: 5  Interim History: Bryan [name] is a patient of Dr. Migdalia [name] and this is first office visit with me. He did portion control and made smarter choices over the holidays. He eats out for lunch and dinner 4 days per week. He has no issues with the meal plan, but it is hard for him to eat all of his protein some days.  Subjective:   1. Polyphagia Bryan [name] would like to increase Mounjaro dose, and he states he and Dr. Leafy [name] discussed this at his last office visit. He has been on the same dose for approximately 1 month now. He desires extra help over Christmas.   2. Vitamin D deficiency Bryan [name] is currently taking prescription vitamin D 50,000 IU each week. He denies nausea, vomiting or muscle weakness.  3. Pre-diabetes Bryan [name] is on metformin currently. He denies carbohydrate cravings, and he continues to work on diet and exercise to decrease his risk of diabetes. He denies nausea or hypoglycemia.  4. Other depression with emotional eating Bryan [name] struggles with emotional eating and using food for comfort to the extent that it is negatively impacting his health. He has been working on behavior modification techniques to help reduce his emotional eating, and his symptoms are well controlled with his current regimen. He shows no sign of suicidal or homicidal ideations.  5. At risk for impaired metabolic function Bryan [name] is at increased risk for impaired metabolic function due  to obesity and pre-diabetes.  Assessment/Plan:  No orders of the defined types were placed in this encounter.   Medications Discontinued During This Encounter  Medication Reason   tirzepatide (MOUNJARO) 12.5 MG/0.5ML Pen    buPROPion (WELLBUTRIN SR) 150 MG 12 hr tablet Reorder   metFORMIN (GLUCOPHAGE) 500 MG tablet Reorder   Vitamin D, Ergocalciferol, (DRISDOL) 1.25 MG (50000 UNIT) CAPS capsule Reorder     Meds ordered this encounter  Medications   buPROPion (WELLBUTRIN SR) 150 MG 12 hr tablet    Sig: Take 1 tablet (150 mg total) by mouth in the morning.    Dispense:  30 tablet    Refill:  0   metFORMIN (GLUCOPHAGE) 500 MG tablet    Sig: TAKE 1 TABLET(500 MG) BY MOUTH THREE TIMES DAILY    Dispense:  30 tablet    Refill:  0   Vitamin D, Ergocalciferol, (DRISDOL) 1.25 MG (50000 UNIT) CAPS capsule    Sig: Take 1 capsule (50,000 Units total) by mouth every 7 (seven) days.    Dispense:  4 capsule    Refill:  0   tirzepatide (MOUNJARO) 15 MG/0.5ML Pen    Sig: Inject 15 mg into the skin once a week.    Dispense:  2 mL    Refill:  0     1. Polyphagia Bryan [name] agreed to increase Mounjaro to 15 mg q weekly, and we will refill for 1 month. Intensive lifestyle modifications are the first line treatment for this issue. We discussed several lifestyle modifications today. Branch  will continue with his prudent nutritional plan, decreasing simple carbohydrates, and increasing protein. Orders and follow up as documented in patient record.  Counseling Polyphagia is excessive hunger. Causes can include: low blood sugars, hyperthyroidism, PMS, lack of sleep, stress, insulin resistance, diabetes, certain medications, and diets that are deficient in protein and fiber.   - tirzepatide (MOUNJARO) 15 MG/0.5ML Pen; Inject 15 mg into the skin once a week.  Dispense: 2 mL; Refill: 0  2. Vitamin D deficiency We will refill prescription Vitamin D for 1 month. Bryan [name] will follow-up for routine testing  of Vitamin D, at least 2-3 times per year to avoid over-replacement.  - Vitamin D, Ergocalciferol, (DRISDOL) 1.25 MG (50000 UNIT) CAPS capsule; Take 1 capsule (50,000 Units total) by mouth every 7 (seven) days.  Dispense: 4 capsule; Refill: 0  3. Pre-diabetes Bryan [name] will continue to work on weight loss, exercise, and decreasing simple carbohydrates to help decrease the risk of diabetes. We will refill metformin for 1 month.  - metFORMIN (GLUCOPHAGE) 500 MG tablet; TAKE 1 TABLET(500 MG) BY MOUTH THREE TIMES DAILY  Dispense: 90 tablet; Refill: 0  4. Other depression with emotional eating Bryan [name] would like to keep Wellbutrin SR at the same dose for now, but he will consider decreasing to 100 mg in the new year because he wishes to discontinue it in the future. Orders and follow up as documented in patient record.   - buPROPion (WELLBUTRIN SR) 150 MG 12 hr tablet; Take 1 tablet (150 mg total) by mouth in the morning.  Dispense: 30 tablet; Refill: 0  5. At risk for impaired metabolic function Bryan [name] was given approximately 9 minutes of impaired  metabolic function prevention counseling today. We discussed intensive lifestyle modifications today with an emphasis on specific nutrition and exercise instructions and strategies.   Repetitive spaced learning was employed today to elicit superior memory formation and behavioral change.   6. Obesity with current BMI of 41.1 Bryan [name] is currently in the action stage of change. As such, his goal is to continue with weight loss efforts. He has agreed to the Category 3 Plan.   Bryan [name] is to try to eat out only 2 times per week. Increase Mounjaro to 15 mg (up from 12.5 mg), and change dosing of Mounjaro to every Wednesday and Thursday, instead of Sunday and Monday.  Exercise goals: As is.  Behavioral modification strategies: increasing lean protein intake, decreasing simple carbohydrates, dealing with coworker sabotage (eats out for work), and holiday  eating strategies .  Bryan [name] has agreed to follow-up with our clinic in 2 to 4 weeks. He was informed of the importance of frequent follow-up visits to maximize his success with intensive lifestyle modifications for his multiple health conditions.   Objective:   Blood pressure 113/73, pulse 69, temperature 97.9 F (36.6 C), height 5\' 11"  (1.803 m), weight 294 lb (133.4 kg), SpO2 98 %. Body mass index is 41 kg/m.  General: Cooperative, alert, well developed, in no acute distress. HEENT: Conjunctivae and lids unremarkable. Cardiovascular: Regular rhythm.  Lungs: Normal work of breathing. Neurologic: No focal deficits.   Lab Results  Component Value Date   CREATININE 0.98 09/06/2021   BUN 16 09/06/2021   NA 136 09/06/2021   K 3.8 09/06/2021   CL 101 09/06/2021   CO2 25 09/06/2021   Lab Results  Component Value Date   ALT 21 09/15/2021   AST 13 09/15/2021   ALKPHOS 58 03/05/2021   BILITOT 0.2 03/05/2021   Lab Results  Component Value Date   HGBA1C 5.8 09/15/2021   HGBA1C 6.2 (H) 02/10/2021   HGBA1C 6.3 (H) 09/11/2020   HGBA1C 6.3 (H) 05/29/2020   HGBA1C 6.4 (H) 01/10/2020   Lab Results  Component Value Date   INSULIN 22.6 02/10/2021   INSULIN 20.9 05/29/2020   INSULIN 24.0 01/10/2020   INSULIN 21.9 11/17/2018   INSULIN 24.0 08/18/2018   Lab Results  Component Value Date   TSH 1.600 05/25/2021   Lab Results  Component Value Date   CHOL 157 02/10/2021   HDL 41 02/10/2021   LDLCALC 79 02/10/2021   LDLDIRECT 136.0 11/16/2016   TRIG 222 (H) 02/10/2021   CHOLHDL 4 07/07/2019   Lab Results  Component Value Date   VD25OH 55.45 09/15/2021   VD25OH 47.1 02/10/2021   VD25OH 57.5 09/18/2020   Lab Results  Component Value Date   WBC 11.8 (H) 09/06/2021   HGB 16.9 09/06/2021   HCT 49.9 09/06/2021   MCV 84.0 09/06/2021   PLT 380 09/06/2021   No results found for: IRON, TIBC, FERRITIN  Attestation Statements:   Reviewed by clinician on day of visit:  allergies, medications, problem list, medical history, surgical history, family history, social history, and previous encounter notes.   Wilhemena Durie, am acting as transcriptionist for [employer], DO.  I have reviewed the above documentation for accuracy and completeness, and I agree with the above. Marjory Sneddon, D.O.  The [place] was signed into law in 2016 which includes the topic of electronic health records.  This provides immediate access to information in MyChart.  This includes consultation notes, operative notes, office notes, lab results and pathology reports.  If you have any questions about what you read please let us know at your next visit so we can discuss your concerns and take corrective action if need be.  We are right here with you.

## 2021-11-26 ENCOUNTER — Other Ambulatory Visit (INDEPENDENT_AMBULATORY_CARE_PROVIDER_SITE_OTHER): Payer: Self-pay | Admitting: Family Medicine

## 2021-11-26 DIAGNOSIS — I10 Essential (primary) hypertension: Secondary | ICD-10-CM

## 2021-11-27 ENCOUNTER — Encounter (INDEPENDENT_AMBULATORY_CARE_PROVIDER_SITE_OTHER): Payer: Self-pay | Admitting: Family Medicine

## 2021-11-27 ENCOUNTER — Ambulatory Visit (INDEPENDENT_AMBULATORY_CARE_PROVIDER_SITE_OTHER): Payer: BC Managed Care – PPO | Admitting: Family Medicine

## 2021-11-27 ENCOUNTER — Other Ambulatory Visit: Payer: Self-pay

## 2021-11-27 VITALS — BP 101/66 | HR 70 | Temp 97.5°F | Ht 71.0 in | Wt 295.0 lb

## 2021-11-27 DIAGNOSIS — Z9189 Other specified personal risk factors, not elsewhere classified: Secondary | ICD-10-CM

## 2021-11-27 DIAGNOSIS — Z6841 Body Mass Index (BMI) 40.0 and over, adult: Secondary | ICD-10-CM

## 2021-11-27 DIAGNOSIS — R632 Polyphagia: Secondary | ICD-10-CM | POA: Diagnosis not present

## 2021-11-27 DIAGNOSIS — F3289 Other specified depressive episodes: Secondary | ICD-10-CM

## 2021-11-27 DIAGNOSIS — E559 Vitamin D deficiency, unspecified: Secondary | ICD-10-CM | POA: Diagnosis not present

## 2021-11-27 DIAGNOSIS — I10 Essential (primary) hypertension: Secondary | ICD-10-CM | POA: Diagnosis not present

## 2021-11-27 MED ORDER — LOSARTAN POTASSIUM 50 MG PO TABS
50.0000 mg | ORAL_TABLET | Freq: Every day | ORAL | 0 refills | Status: DC
Start: 2021-11-27 — End: 2021-12-17

## 2021-11-27 MED ORDER — TIRZEPATIDE 15 MG/0.5ML ~~LOC~~ SOAJ: 15.0000 mg | SUBCUTANEOUS | 0 refills | Status: DC

## 2021-11-27 MED ORDER — VITAMIN D (ERGOCALCIFEROL) 1.25 MG (50000 UNIT) PO CAPS: 50000.0000 [IU] | ORAL_CAPSULE | ORAL | 0 refills | Status: DC

## 2021-11-27 MED ORDER — METFORMIN HCL 500 MG PO TABS: ORAL_TABLET | ORAL | 0 refills | Status: DC

## 2021-11-27 MED ORDER — BUPROPION HCL ER (SR) 150 MG PO TB12: 150.0000 mg | ORAL_TABLET | Freq: Every morning | ORAL | 0 refills | Status: DC

## 2021-12-08 NOTE — Progress Notes (Signed)
Chief Complaint:   OBESITY Bryan [name] is here to discuss his progress with his obesity treatment plan along with follow-up of his obesity related diagnoses. Bryan [name] is on the Category 3 Plan and states he is following his eating plan approximately 75% of the time. Bryan [name] states he is walking 30 minutes 3-4 times per week.  Today's visit was #: 8 Starting weight: 326 lbs Starting date: 09/08/2017 Today's weight: 295 lbs Today's date: 11/27/2021 Total lbs lost to date: 31 Total lbs lost since last in-office visit: +1  Interim History: Bryan [name] is here for a follow up office visit.  We reviewed his meal plan and questions were answered.  Patient's food recall appears to be accurate and consistent with what is on plan when he is following it.   When eating on plan, his hunger and cravings are well controlled.   Bryan [name] had a couple of celebration eating events he went to recently. He is trying to eat more proteins and less carbs. He is happy how he managed them.  Subjective:   1. Polyphagia At [name]'s last OV we increased his dose of Mounjaro to 15 mg. He has had 2 doses and is tolerating them well with no side effects.  2. Essential hypertension Pt denies dizziness or lightheadedness. He is not checking his BP at home because he has felt fine.  3. Other depression with emotional eating Bryan [name]'s symptoms are controlled and he denies emotional eating.  4. Vitamin D deficiency He is currently taking prescription vitamin D 50,000 IU each week. He denies nausea, vomiting or muscle weakness.  5. At risk for complication associated with hypotension  The patient is at a higher than average risk of hypotension due to lower BP today.  Assessment/Plan:  No orders of the defined types were placed in this encounter.   Medications Discontinued During This Encounter  Medication Reason   Insulin Pen Needle (BD PEN NEEDLE NANO U/F) 32G X 4 MM MISC    losartan (COZAAR) 50 MG  tablet Reorder   buPROPion (WELLBUTRIN SR) 150 MG 12 hr tablet Reorder   metFORMIN (GLUCOPHAGE) 500 MG tablet Reorder   Vitamin D, Ergocalciferol, (DRISDOL) 1.25 MG (50000 UNIT) CAPS capsule Reorder   tirzepatide (MOUNJARO) 15 MG/0.5ML Pen Reorder     Meds ordered this encounter  Medications   buPROPion (WELLBUTRIN SR) 150 MG 12 hr tablet    Sig: Take 1 tablet (150 mg total) by mouth in the morning.    Dispense:  30 tablet    Refill:  0   losartan (COZAAR) 50 MG tablet    Sig: Take 1 tablet (50 mg total) by mouth daily.    Dispense:  30 tablet    Refill:  0   metFORMIN (GLUCOPHAGE) 500 MG tablet    Sig: TAKE 1 TABLET(500 MG) BY MOUTH THREE TIMES DAILY    Dispense:  90 tablet    Refill:  0   tirzepatide (MOUNJARO) 15 MG/0.5ML Pen    Sig: Inject 15 mg into the skin once a week.    Dispense:  2 mL    Refill:  0   Vitamin D, Ergocalciferol, (DRISDOL) 1.25 MG (50000 UNIT) CAPS capsule    Sig: Take 1 capsule (50,000 Units total) by mouth every 7 (seven) days.    Dispense:  4 capsule    Refill:  0     1. Polyphagia Intensive lifestyle modifications are the first line treatment for this issue. We discussed several lifestyle modifications  today and he will continue to work on diet, exercise and weight loss efforts. Orders and follow up as documented in patient record.  Counseling Polyphagia is excessive hunger. Causes can include: low blood sugars, hypERthyroidism, PMS, lack of sleep, stress, insulin resistance, diabetes, certain medications, and diets that are deficient in protein and fiber.   Refill- metFORMIN (GLUCOPHAGE) 500 MG tablet; TAKE 1 TABLET(500 MG) BY MOUTH THREE TIMES DAILY  Dispense: 90 tablet; Refill: 0 Refill- tirzepatide (MOUNJARO) 15 MG/0.5ML Pen; Inject 15 mg into the skin once a week.  Dispense: 2 mL; Refill: 0  2. Essential hypertension Bryan [name] is working on healthy weight loss and exercise to improve blood pressure control. We will watch for signs of  hypotension as he continues his lifestyle modifications.  Refill- losartan (COZAAR) 50 MG tablet; Take 1 tablet (50 mg total) by mouth daily.  Dispense: 30 tablet; Refill: 0  3. Other depression with emotional eating Behavior modification techniques were discussed today to help Bryan [name] deal with his emotional/non-hunger eating behaviors.  Orders and follow up as documented in patient record.   Refill- buPROPion (WELLBUTRIN SR) 150 MG 12 hr tablet; Take 1 tablet (150 mg total) by mouth in the morning.  Dispense: 30 tablet; Refill: 0  4. Vitamin D deficiency Low Vitamin D level contributes to fatigue and are associated with obesity, breast, and colon cancer. He agrees to continue to take prescription Vitamin D 50,000 IU every week and will follow-up for routine testing of Vitamin D, at least 2-3 times per year to avoid over-replacement.  Refill- Vitamin D, Ergocalciferol, (DRISDOL) 1.25 MG (50000 UNIT) CAPS capsule; Take 1 capsule (50,000 Units total) by mouth every 7 (seven) days.  Dispense: 4 capsule; Refill: 0  5. At risk for complication associated with hypotension Bryan [name] was given approximately 9 minutes of education and counseling today regarding the condition of hypotension, the pathophysiology of the condition and concerns regarding prevention and treatment of this condition.  We discussed risks of medications used to treat hypertension, which in the setting of weight loss, can inadvertently cause hypotension.  Signs of hypotension such as feeling lightheaded or unsteady, esp when getting up first thing in the AM or off the toilet, were reviewed in detail and all questions were answered.  The use of motivational interviewing was employed as a technique as well today to aid in the treatment of patient's multiple conditions.  Pt understands importance of proper hydration and also of more prudent home BP monitoring while actively losing weight. He will call us, or their PCP,  or other specialists  who treat their BP with medications, with any questions or concerns that may develop. Pt will check BP at home.  6. Obesity with current BMI of 41.2  Bryan [name] is currently in the action stage of change. As such, his goal is to continue with weight loss efforts. He has agreed to the Category 3 Plan.   Exercise goals:  As is and increase as tolerated.  Behavioral modification strategies: celebration eating strategies and avoiding temptations.  Bryan [name] has agreed to follow-up with our clinic in 3 weeks. He was informed of the importance of frequent follow-up visits to maximize his success with intensive lifestyle modifications for his multiple health conditions.   Objective:   Blood pressure 101/66, pulse 70, temperature (!) 97.5 F (36.4 C), height 5\' 11"  (1.803 m), weight 295 lb (133.8 kg), SpO2 99 %. Body mass index is 41.14 kg/m.  General: Cooperative, alert, well developed, in no acute distress. HEENT:  Conjunctivae and lids unremarkable. Cardiovascular: Regular rhythm.  Lungs: Normal work of breathing. Neurologic: No focal deficits.   Lab Results  Component Value Date   CREATININE 0.98 09/06/2021   BUN 16 09/06/2021   NA 136 09/06/2021   K 3.8 09/06/2021   CL 101 09/06/2021   CO2 25 09/06/2021   Lab Results  Component Value Date   ALT 21 09/15/2021   AST 13 09/15/2021   ALKPHOS 58 03/05/2021   BILITOT 0.2 03/05/2021   Lab Results  Component Value Date   HGBA1C 5.8 09/15/2021   HGBA1C 6.2 (H) 02/10/2021   HGBA1C 6.3 (H) 09/11/2020   HGBA1C 6.3 (H) 05/29/2020   HGBA1C 6.4 (H) 01/10/2020   Lab Results  Component Value Date   INSULIN 22.6 02/10/2021   INSULIN 20.9 05/29/2020   INSULIN 24.0 01/10/2020   INSULIN 21.9 11/17/2018   INSULIN 24.0 08/18/2018   Lab Results  Component Value Date   TSH 1.600 05/25/2021   Lab Results  Component Value Date   CHOL 157 02/10/2021   HDL 41 02/10/2021   LDLCALC 79 02/10/2021   LDLDIRECT 136.0 11/16/2016   TRIG 222 (H)  02/10/2021   CHOLHDL 4 07/07/2019   Lab Results  Component Value Date   VD25OH 55.45 09/15/2021   VD25OH 47.1 02/10/2021   VD25OH 57.5 09/18/2020   Lab Results  Component Value Date   WBC 11.8 (H) 09/06/2021   HGB 16.9 09/06/2021   HCT 49.9 09/06/2021   MCV 84.0 09/06/2021   PLT 380 09/06/2021    Attestation Statements:   Reviewed by clinician on day of visit: allergies, medications, problem list, medical history, surgical history, family history, social history, and previous encounter notes.  I, Kathlene November, CM A, am acting as transcriptionist for [employer], DO.  I have reviewed the above documentation for accuracy and completeness, and I agree with the above. Marjory Sneddon, D.O.  The [place] was signed into law in 2016 which includes the topic of electronic health records.  This provides immediate access to information in MyChart.  This includes consultation notes, operative notes, office notes, lab results and pathology reports.  If you have any questions about what you read please let us know at your next visit so we can discuss your concerns and take corrective action if need be.  We are right here with you.

## 2021-12-09 ENCOUNTER — Telehealth (INDEPENDENT_AMBULATORY_CARE_PROVIDER_SITE_OTHER): Payer: Self-pay

## 2021-12-09 NOTE — Telephone Encounter (Signed)
PA initiated via [email or web address] for Mounjaro mg weekly injection.  KeyDelman Cheadle - PA Case ID: [id number]   Rx #: [id number]  Status: Sent to [facility]: Bryan [name] 15MG /0.5ML pen-injectors Form: Charity fundraiser PA Form (2017 [place]) Original Claim Info 70,MR

## 2021-12-17 ENCOUNTER — Ambulatory Visit (INDEPENDENT_AMBULATORY_CARE_PROVIDER_SITE_OTHER): Payer: BC Managed Care – PPO | Admitting: Family Medicine

## 2021-12-17 ENCOUNTER — Other Ambulatory Visit: Payer: Self-pay

## 2021-12-17 ENCOUNTER — Encounter (INDEPENDENT_AMBULATORY_CARE_PROVIDER_SITE_OTHER): Payer: Self-pay | Admitting: Family Medicine

## 2021-12-17 VITALS — BP 116/75 | HR 77 | Temp 97.7°F | Ht 71.0 in | Wt 292.0 lb

## 2021-12-17 DIAGNOSIS — R7303 Prediabetes: Secondary | ICD-10-CM | POA: Diagnosis not present

## 2021-12-17 DIAGNOSIS — I10 Essential (primary) hypertension: Secondary | ICD-10-CM | POA: Diagnosis not present

## 2021-12-17 DIAGNOSIS — Z6841 Body Mass Index (BMI) 40.0 and over, adult: Secondary | ICD-10-CM

## 2021-12-17 DIAGNOSIS — F3289 Other specified depressive episodes: Secondary | ICD-10-CM

## 2021-12-17 DIAGNOSIS — E66813 Obesity, class 3: Secondary | ICD-10-CM

## 2021-12-17 DIAGNOSIS — E559 Vitamin D deficiency, unspecified: Secondary | ICD-10-CM

## 2021-12-17 DIAGNOSIS — E7849 Other hyperlipidemia: Secondary | ICD-10-CM

## 2021-12-17 MED ORDER — BUPROPION HCL ER (SR) 150 MG PO TB12: 150.0000 mg | ORAL_TABLET | Freq: Every morning | ORAL | 0 refills | Status: DC

## 2021-12-17 MED ORDER — LOSARTAN POTASSIUM 50 MG PO TABS: 50.0000 mg | ORAL_TABLET | Freq: Every day | ORAL | 0 refills | Status: DC

## 2021-12-17 MED ORDER — VITAMIN D (ERGOCALCIFEROL) 1.25 MG (50000 UNIT) PO CAPS: 50000.0000 [IU] | ORAL_CAPSULE | ORAL | 0 refills | Status: DC

## 2021-12-18 NOTE — Progress Notes (Signed)
Chief Complaint:   OBESITY Bryan [name] is here to discuss his progress with his obesity treatment plan along with follow-up of his obesity related diagnoses. Bryan [name] is on the Category 3 Plan and states he is following his eating plan approximately 75% of the time. Bryan [name] states he is walking for 30 minutes 4 times per week.  Today's visit was #: 102 Starting weight: 326 lbs Starting date: 09/08/2017 Today's weight: 292 lbs Today's date: 12/17/2021 Total lbs lost to date: 34 Total lbs lost since last in-office visit: 3  Interim History: Bryan [name] notes over the holidays he tried to get his protein in, and he tried to have smaller amounts of the "unhealthy" foods. He is back to walking and doing cardio 4 days per week.  Subjective:   1. Pre-diabetes with polyphagia Bryan [name] is tolerating Mounjaro well, but his insurance will not continue to cover it.  2. Essential hypertension Bryan [name]'s blood pressure is at goal today. He is taking losartan.  BP Readings from Last 3 Encounters:  12/17/21 116/75  11/27/21 101/66  11/12/21 113/73   3. Vitamin D deficiency Bryan [name] is currently taking prescription vitamin D 50,000 IU each week. He denies nausea, vomiting or muscle weakness.  4. Other depression with emotional eating Bryan [name] struggles with emotional eating and using food for comfort to the extent that it is negatively impacting his health. He has been working on behavior modification techniques to help reduce his emotional eating and has been somewhat successful. His mood is stable and he is doing well. He shows no sign of suicidal or homicidal ideations.  Assessment/Plan:  No orders of the defined types were placed in this encounter.   Medications Discontinued During This Encounter  Medication Reason   buPROPion (WELLBUTRIN SR) 150 MG 12 hr tablet Reorder   Vitamin D, Ergocalciferol, (DRISDOL) 1.25 MG (50000 UNIT) CAPS capsule Reorder   losartan (COZAAR) 50 MG tablet Reorder      Meds ordered this encounter  Medications   buPROPion (WELLBUTRIN SR) 150 MG 12 hr tablet    Sig: Take 1 tablet (150 mg total) by mouth in the morning.    Dispense:  30 tablet    Refill:  0   Vitamin D, Ergocalciferol, (DRISDOL) 1.25 MG (50000 UNIT) CAPS capsule    Sig: Take 1 capsule (50,000 Units total) by mouth every 7 (seven) days.    Dispense:  4 capsule    Refill:  0   losartan (COZAAR) 50 MG tablet    Sig: Take 1 tablet (50 mg total) by mouth daily.    Dispense:  30 tablet    Refill:  0     1. Pre-diabetes with polyphagia Bryan [name] will continue [facility] for now, and will continue metformin. He will continue to work on weight loss, exercise, and decreasing simple carbohydrates to help decrease the risk of diabetes.   2. Essential hypertension We will refill losartan for 1 month. Bryan [name] will continue working on healthy weight loss and exercise to improve blood pressure control. We will watch for signs of hypotension as he continues his lifestyle modifications.  - losartan (COZAAR) 50 MG tablet; Take 1 tablet (50 mg total) by mouth daily.  Dispense: 30 tablet; Refill: 0  3. Vitamin D deficiency We will refill prescription Vitamin D for 1 month. Bryan [name] will follow-up for routine testing of Vitamin D, at least 2-3 times per year to avoid over-replacement.  - Vitamin D, Ergocalciferol, (DRISDOL) 1.25 MG (50000 UNIT) CAPS capsule; Take 1 capsule (50,000  Units total) by mouth every 7 (seven) days.  Dispense: 4 capsule; Refill: 0  4. Other depression with emotional eating Behavior modification techniques were discussed today to help Bryan [name] deal with his emotional/non-hunger eating behaviors. We will refill Wellbutrin SR for 1 month. Orders and follow up as documented in patient record.   - buPROPion (WELLBUTRIN SR) 150 MG 12 hr tablet; Take 1 tablet (150 mg total) by mouth in the morning.  Dispense: 30 tablet; Refill: 0  5. Obesity with current BMI of 40.8 Bryan [name] is currently  in the action stage of change. As such, his goal is to continue with weight loss efforts. He has agreed to the Category 3 Plan.   Exercise goals: For substantial health benefits, adults should do at least 150 minutes (2 hours and 30 minutes) a week of moderate-intensity, or 75 minutes (1 hour and 15 minutes) a week of vigorous-intensity aerobic physical activity, or an equivalent combination of moderate- and vigorous-intensity aerobic activity. Aerobic activity should be performed in episodes of at least 10 minutes, and preferably, it should be spread throughout the week. (He is to walk 5 days per week, and row machine for 30 minutes).  Behavioral modification strategies: increasing lean protein intake, decreasing simple carbohydrates, and planning for success.  Bryan [name] has agreed to follow-up with our clinic in 3 weeks. He was informed of the importance of frequent follow-up visits to maximize his success with intensive lifestyle modifications for his multiple health conditions.   Objective:   Blood pressure 116/75, pulse 77, temperature 97.7 F (36.5 C), height 5\' 11"  (1.803 m), weight 292 lb (132.5 kg), SpO2 99 %. Body mass index is 40.73 kg/m.  General: Cooperative, alert, well developed, in no acute distress. HEENT: Conjunctivae and lids unremarkable. Cardiovascular: Regular rhythm.  Lungs: Normal work of breathing. Neurologic: No focal deficits.   Lab Results  Component Value Date   CREATININE 0.98 09/06/2021   BUN 16 09/06/2021   NA 136 09/06/2021   K 3.8 09/06/2021   CL 101 09/06/2021   CO2 25 09/06/2021   Lab Results  Component Value Date   ALT 21 09/15/2021   AST 13 09/15/2021   ALKPHOS 58 03/05/2021   BILITOT 0.2 03/05/2021   Lab Results  Component Value Date   HGBA1C 5.8 09/15/2021   HGBA1C 6.2 (H) 02/10/2021   HGBA1C 6.3 (H) 09/11/2020   HGBA1C 6.3 (H) 05/29/2020   HGBA1C 6.4 (H) 01/10/2020   Lab Results  Component Value Date   INSULIN 22.6 02/10/2021    INSULIN 20.9 05/29/2020   INSULIN 24.0 01/10/2020   INSULIN 21.9 11/17/2018   INSULIN 24.0 08/18/2018   Lab Results  Component Value Date   TSH 1.600 05/25/2021   Lab Results  Component Value Date   CHOL 157 02/10/2021   HDL 41 02/10/2021   LDLCALC 79 02/10/2021   LDLDIRECT 136.0 11/16/2016   TRIG 222 (H) 02/10/2021   CHOLHDL 4 07/07/2019   Lab Results  Component Value Date   VD25OH 55.45 09/15/2021   VD25OH 47.1 02/10/2021   VD25OH 57.5 09/18/2020   Lab Results  Component Value Date   WBC 11.8 (H) 09/06/2021   HGB 16.9 09/06/2021   HCT 49.9 09/06/2021   MCV 84.0 09/06/2021   PLT 380 09/06/2021   No results found for: IRON, TIBC, FERRITIN  Obesity Behavioral Intervention:   Approximately 15 minutes were spent on the discussion below.  ASK: We discussed the diagnosis of obesity with Bryan [name] today and Bryan [name] agreed to give  [place] permission to discuss obesity behavioral modification therapy today.  ASSESS: Bryan [name] has the diagnosis of obesity and his BMI today is 40.8. Bryan [name] is in the action stage of change.   ADVISE: Bryan [name] was educated on the multiple health risks of obesity as well as the benefit of weight loss to improve his health. He was advised of the need for long term treatment and the importance of lifestyle modifications to improve his current health and to decrease his risk of future health problems.  AGREE: Multiple dietary modification options and treatment options were discussed and Bryan [name] agreed to follow the recommendations documented in the above note.  ARRANGE: Bryan [name] was educated on the importance of frequent visits to treat obesity as outlined per CMS and USPSTF guidelines and agreed to schedule his next follow up appointment today.  Attestation Statements:   Reviewed by clinician on day of visit: allergies, medications, problem list, medical history, surgical history, family history, social history, and previous encounter notes.   Bryan [name], am acting as transcriptionist for [employer], DO.  I have reviewed the above documentation for accuracy and completeness, and I agree with the above. Bryan [name], D.O.  [place] was signed into law in 2016 which includes the topic of electronic health records.  This provides immediate access to information in MyChart.  This includes consultation notes, operative notes, office notes, lab results and pathology reports.  If you have any questions about what you read please let us know at your next visit so we can discuss your concerns and take corrective action if need be.  We are right here with you.

## 2021-12-31 ENCOUNTER — Other Ambulatory Visit: Payer: Self-pay

## 2021-12-31 ENCOUNTER — Other Ambulatory Visit: Payer: BC Managed Care – PPO | Admitting: *Deleted

## 2021-12-31 ENCOUNTER — Ambulatory Visit (HOSPITAL_COMMUNITY): Payer: BC Managed Care – PPO | Admitting: Physician Assistant

## 2021-12-31 DIAGNOSIS — I4510 Unspecified right bundle-branch block: Secondary | ICD-10-CM

## 2021-12-31 DIAGNOSIS — G4733 Obstructive sleep apnea (adult) (pediatric): Secondary | ICD-10-CM

## 2021-12-31 DIAGNOSIS — I10 Essential (primary) hypertension: Secondary | ICD-10-CM

## 2021-12-31 DIAGNOSIS — I4892 Unspecified atrial flutter: Secondary | ICD-10-CM

## 2021-12-31 DIAGNOSIS — I4891 Unspecified atrial fibrillation: Secondary | ICD-10-CM

## 2021-12-31 LAB — CBC WITH DIFFERENTIAL/PLATELET
Basophils Absolute: 0.1 10*3/uL (ref 0.0–0.2)
Basos: 1 %
EOS (ABSOLUTE): 0.2 10*3/uL (ref 0.0–0.4)
Eos: 3 %
Hematocrit: 44.8 % (ref 37.5–51.0)
Hemoglobin: 15.4 g/dL (ref 13.0–17.7)
Immature Grans (Abs): 0 10*3/uL (ref 0.0–0.1)
Immature Granulocytes: 0 %
Lymphocytes Absolute: 2.2 10*3/uL (ref 0.7–3.1)
Lymphs: 28 %
MCH: 28.9 pg (ref 26.6–33.0)
MCHC: 34.4 g/dL (ref 31.5–35.7)
MCV: 84 fL (ref 79–97)
Monocytes Absolute: 0.6 10*3/uL (ref 0.1–0.9)
Monocytes: 8 %
Neutrophils Absolute: 4.7 10*3/uL (ref 1.4–7.0)
Neutrophils: 60 %
Platelets: 301 10*3/uL (ref 150–450)
RBC: 5.33 x10E6/uL (ref 4.14–5.80)
RDW: 13.3 % (ref 11.6–15.4)
WBC: 7.9 10*3/uL (ref 3.4–10.8)

## 2021-12-31 LAB — BASIC METABOLIC PANEL
BUN/Creatinine Ratio: 16 (ref 9–20)
BUN: 15 mg/dL (ref 6–24)
CO2: 28 mmol/L (ref 20–29)
Calcium: 9.6 mg/dL (ref 8.7–10.2)
Chloride: 100 mmol/L (ref 96–106)
Creatinine, Ser: 0.92 mg/dL (ref 0.76–1.27)
Glucose: 105 mg/dL — ABNORMAL HIGH (ref 70–99)
Potassium: 3.9 mmol/L (ref 3.5–5.2)
Sodium: 139 mmol/L (ref 134–144)
eGFR: 96 mL/min/{1.73_m2} (ref >59)

## 2022-01-06 ENCOUNTER — Other Ambulatory Visit (HOSPITAL_COMMUNITY): Payer: Self-pay | Admitting: Physician Assistant

## 2022-01-08 ENCOUNTER — Encounter (INDEPENDENT_AMBULATORY_CARE_PROVIDER_SITE_OTHER): Payer: Self-pay | Admitting: Family Medicine

## 2022-01-08 ENCOUNTER — Encounter: Payer: Self-pay | Admitting: Cardiology

## 2022-01-08 ENCOUNTER — Other Ambulatory Visit: Payer: Self-pay

## 2022-01-08 ENCOUNTER — Ambulatory Visit (INDEPENDENT_AMBULATORY_CARE_PROVIDER_SITE_OTHER): Payer: BC Managed Care – PPO | Admitting: Family Medicine

## 2022-01-08 VITALS — BP 114/72 | HR 71 | Temp 97.9°F | Ht 71.0 in | Wt 289.0 lb

## 2022-01-08 DIAGNOSIS — E669 Obesity, unspecified: Secondary | ICD-10-CM

## 2022-01-08 DIAGNOSIS — R7303 Prediabetes: Secondary | ICD-10-CM

## 2022-01-08 DIAGNOSIS — Z6841 Body Mass Index (BMI) 40.0 and over, adult: Secondary | ICD-10-CM

## 2022-01-08 DIAGNOSIS — E559 Vitamin D deficiency, unspecified: Secondary | ICD-10-CM | POA: Diagnosis not present

## 2022-01-08 DIAGNOSIS — K219 Gastro-esophageal reflux disease without esophagitis: Secondary | ICD-10-CM | POA: Diagnosis not present

## 2022-01-08 MED ORDER — SAXENDA 18 MG/3ML ~~LOC~~ SOPN: 3.0000 mg | PEN_INJECTOR | Freq: Every day | SUBCUTANEOUS | 0 refills | Status: AC

## 2022-01-08 MED ORDER — VITAMIN D (ERGOCALCIFEROL) 1.25 MG (50000 UNIT) PO CAPS: 50000.0000 [IU] | ORAL_CAPSULE | ORAL | 0 refills | Status: DC

## 2022-01-08 NOTE — Progress Notes (Signed)
Chief Complaint:   OBESITY Bryan [name] is here to discuss his progress with his obesity treatment plan along with follow-up of his obesity related diagnoses. Jerelle is on the Category 3 Plan and states he is following his eating plan approximately 75% of the time. Beckem states he is walking on the treadmill for 30 minutes 4 times per week.  Today's visit was #: 14 Starting weight: 326 lbs Starting date: 09/08/2017 Today's weight: 289 lbs Today's date: 01/08/2022 Total lbs lost to date: 37 Total lbs lost since last in-office visit: 3  Interim History: Ole continues to do well with weight loss. His insurance will no longer cover his previous GLP-1. He did well with Saxenda in the past.  Subjective:   1. Gastroesophageal reflux disease, unspecified whether esophagitis present Markice notes increase in symptoms especially when he drinks coffee. He is also on Mounjaro which my be contributing.  2. Pre-diabetes Trey is working on diet and exercise, and insurance will not cover any longer.   3. Vitamin D deficiency Mabel is stable with no side effects noted. Last Vit D level was at goal.  Assessment/Plan:   1. Gastroesophageal reflux disease, unspecified whether esophagitis present Intensive lifestyle modifications are the first line treatment for this issue. We discussed several lifestyle modifications today. Travin will discontinue coffee and will continue to follow. Orders and follow up as documented in patient record.  2. Pre-diabetes Hasnain agreed to discontinue Mounjaro, and will continue metformin as is. He is to hold metformin for 3 days before his IV contrast. He will continue to work on weight loss, exercise, and decreasing simple carbohydrates to help decrease the risk of diabetes.   3. Vitamin D deficiency We will refill prescription Vitamin D for 1 month. Jorma will follow-up for routine testing of Vitamin D, at least 2-3 times per year to avoid  over-replacement.  - Vitamin D, Ergocalciferol, (DRISDOL) 1.25 MG (50000 UNIT) CAPS capsule; Take 1 capsule (50,000 Units total) by mouth every 7 (seven) days.  Dispense: 4 capsule; Refill: 0  4. Obesity with current BMI of 40.4 Korrey is currently in the action stage of change. As such, his goal is to continue with weight loss efforts. He has agreed to the Category 3 Plan.   We discussed various medication options to help Alucard with his weight loss efforts and we both agreed to start Saxenda 3 mg daily with no refills.  - Liraglutide -Weight Management (SAXENDA) 18 MG/3ML SOPN; Inject 3 mg into the skin daily with breakfast.  Dispense: 15 mL; Refill: 0  Exercise goals: As is.  Behavioral modification strategies: increasing lean protein intake and meal planning and cooking strategies.  Fitzpatrick has agreed to follow-up with our clinic in 3 weeks. He was informed of the importance of frequent follow-up visits to maximize his success with intensive lifestyle modifications for his multiple health conditions.   Objective:   Blood pressure 114/72, pulse 71, temperature 97.9 F (36.6 C), height 5\' 11"  (1.803 m), weight 289 lb (131.1 kg), SpO2 99 %. Body mass index is 40.31 kg/m.  General: Cooperative, alert, well developed, in no acute distress. HEENT: Conjunctivae and lids unremarkable. Cardiovascular: Regular rhythm.  Lungs: Normal work of breathing. Neurologic: No focal deficits.   Lab Results  Component Value Date   CREATININE 0.92 12/31/2021   BUN 15 12/31/2021   NA 139 12/31/2021   K 3.9 12/31/2021   CL 100 12/31/2021   CO2 28 12/31/2021   Lab Results  Component Value  Date   ALT 21 09/15/2021   AST 13 09/15/2021   ALKPHOS 58 03/05/2021   BILITOT 0.2 03/05/2021   Lab Results  Component Value Date   HGBA1C 5.8 09/15/2021   HGBA1C 6.2 (H) 02/10/2021   HGBA1C 6.3 (H) 09/11/2020   HGBA1C 6.3 (H) 05/29/2020   HGBA1C 6.4 (H) 01/10/2020   Lab Results  Component Value  Date   INSULIN 22.6 02/10/2021   INSULIN 20.9 05/29/2020   INSULIN 24.0 01/10/2020   INSULIN 21.9 11/17/2018   INSULIN 24.0 08/18/2018   Lab Results  Component Value Date   TSH 1.600 05/25/2021   Lab Results  Component Value Date   CHOL 157 02/10/2021   HDL 41 02/10/2021   LDLCALC 79 02/10/2021   LDLDIRECT 136.0 11/16/2016   TRIG 222 (H) 02/10/2021   CHOLHDL 4 07/07/2019   Lab Results  Component Value Date   VD25OH 55.45 09/15/2021   VD25OH 47.1 02/10/2021   VD25OH 57.5 09/18/2020   Lab Results  Component Value Date   WBC 7.9 12/31/2021   HGB 15.4 12/31/2021   HCT 44.8 12/31/2021   MCV 84 12/31/2021   PLT 301 12/31/2021   No results found for: IRON, TIBC, FERRITIN  Attestation Statements:   Reviewed by clinician on day of visit: allergies, medications, problem list, medical history, surgical history, family history, social history, and previous encounter notes.   I, Trixie Dredge, am acting as transcriptionist for Dennard Nip, MD.  I have reviewed the above documentation for accuracy and completeness, and I agree with the above. -  Dennard Nip, MD

## 2022-01-09 ENCOUNTER — Telehealth: Payer: Self-pay | Admitting: Cardiology

## 2022-01-09 ENCOUNTER — Telehealth (HOSPITAL_COMMUNITY): Payer: Self-pay | Admitting: *Deleted

## 2022-01-09 NOTE — Telephone Encounter (Signed)
Patient calling in to see what he needs to do do prepare for his procedure tomorrow. Please advise

## 2022-01-09 NOTE — Telephone Encounter (Signed)
Reaching out to patient to offer assistance regarding upcoming cardiac imaging study; pt verbalizes understanding of appt date/time, parking situation and where to check in, pre-test NPO status, and verified current allergies; name and call back number provided for further questions should they arise  Gordy Clement RN Navigator Cardiac [place] [facility] [phone] office [phone] cell  Patient aware to arrive at 1pm for his 1:30pm scan.

## 2022-01-09 NOTE — Telephone Encounter (Signed)
Returned call to Pt.  Advised of instructions for CT.  Also provided education on how to find instruction letters in [place].  All questions answered.

## 2022-01-12 ENCOUNTER — Ambulatory Visit (HOSPITAL_COMMUNITY)
Admission: RE | Admit: 2022-01-12 | Discharge: 2022-01-12 | Disposition: A | Discharge status: Home / Self Care | Payer: BC Managed Care – PPO | Source: Ambulatory Visit | Attending: Cardiology | Admitting: Cardiology

## 2022-01-12 ENCOUNTER — Other Ambulatory Visit: Payer: Self-pay

## 2022-01-12 ENCOUNTER — Encounter (HOSPITAL_COMMUNITY): Payer: Self-pay

## 2022-01-12 DIAGNOSIS — I4891 Unspecified atrial fibrillation: Secondary | ICD-10-CM | POA: Diagnosis present

## 2022-01-12 MED ORDER — IOHEXOL 350 MG/ML SOLN
80.0000 mL | Freq: Once | INTRAVENOUS | Status: AC | PRN
  Administered 2022-01-12: 80 mL via INTRAVENOUS

## 2022-01-16 NOTE — Pre-Procedure Instructions (Signed)
Attempted to call patient regarding procedure instructions for Monday.  Left voice mail on the following items: Arrival time 0530 Nothing to eat or drink after midnight No meds AM of procedure Responsible person to drive you home and stay with you for 24 hrs  Have you missed any doses of anti-coagulant Eliquis- take this weekend, don't take any Monday morning

## 2022-01-19 ENCOUNTER — Ambulatory Visit (HOSPITAL_COMMUNITY): Payer: BC Managed Care – PPO | Admitting: Certified Registered"

## 2022-01-19 ENCOUNTER — Encounter (HOSPITAL_COMMUNITY): Payer: Self-pay | Admitting: Cardiology

## 2022-01-19 ENCOUNTER — Ambulatory Visit (HOSPITAL_COMMUNITY)
Admission: RE | Admit: 2022-01-19 | Discharge: 2022-01-19 | Disposition: A | Discharge status: Home / Self Care | Payer: BC Managed Care – PPO | Attending: Cardiology | Admitting: Cardiology

## 2022-01-19 ENCOUNTER — Other Ambulatory Visit: Payer: Self-pay

## 2022-01-19 ENCOUNTER — Encounter (HOSPITAL_COMMUNITY)
Admission: RE | Disposition: A | Discharge status: Home / Self Care | Payer: BC Managed Care – PPO | Source: Home / Self Care | Attending: Cardiology

## 2022-01-19 DIAGNOSIS — Z6841 Body Mass Index (BMI) 40.0 and over, adult: Secondary | ICD-10-CM | POA: Diagnosis not present

## 2022-01-19 DIAGNOSIS — I483 Typical atrial flutter: Secondary | ICD-10-CM | POA: Insufficient documentation

## 2022-01-19 DIAGNOSIS — I10 Essential (primary) hypertension: Secondary | ICD-10-CM | POA: Insufficient documentation

## 2022-01-19 DIAGNOSIS — E785 Hyperlipidemia, unspecified: Secondary | ICD-10-CM | POA: Insufficient documentation

## 2022-01-19 DIAGNOSIS — G4733 Obstructive sleep apnea (adult) (pediatric): Secondary | ICD-10-CM | POA: Diagnosis not present

## 2022-01-19 DIAGNOSIS — E669 Obesity, unspecified: Secondary | ICD-10-CM | POA: Diagnosis not present

## 2022-01-19 DIAGNOSIS — I4510 Unspecified right bundle-branch block: Secondary | ICD-10-CM | POA: Diagnosis not present

## 2022-01-19 DIAGNOSIS — I4891 Unspecified atrial fibrillation: Secondary | ICD-10-CM | POA: Insufficient documentation

## 2022-01-19 HISTORY — PX: A-FLUTTER ABLATION: EP1230

## 2022-01-19 LAB — GLUCOSE, CAPILLARY
Glucose-Capillary: 98 mg/dL (ref 70–99)
Glucose-Capillary: 108 mg/dL — ABNORMAL HIGH (ref 70–99)

## 2022-01-19 SURGERY — A-FLUTTER ABLATION
Anesthesia: General

## 2022-01-19 MED ORDER — HEPARIN SODIUM (PORCINE) 1000 UNIT/ML IJ SOLN
INTRAMUSCULAR | Status: AC
  Filled 2022-01-19: qty 10

## 2022-01-19 MED ORDER — DEXAMETHASONE SODIUM PHOSPHATE 10 MG/ML IJ SOLN
INTRAMUSCULAR | Status: DC | PRN
  Administered 2022-01-19: 5 mg via INTRAVENOUS

## 2022-01-19 MED ORDER — ISOPROTERENOL HCL 0.2 MG/ML IJ SOLN
INTRAMUSCULAR | Status: AC
  Filled 2022-01-19: qty 5

## 2022-01-19 MED ORDER — LIDOCAINE 2% (20 MG/ML) 5 ML SYRINGE
INTRAMUSCULAR | Status: DC | PRN
  Administered 2022-01-19: 80 mg via INTRAVENOUS

## 2022-01-19 MED ORDER — FENTANYL CITRATE (PF) 100 MCG/2ML IJ SOLN
INTRAMUSCULAR | Status: DC | PRN
Start: 2022-01-19 — End: 2022-01-19
  Administered 2022-01-19: 50 ug via INTRAVENOUS
  Administered 2022-01-19: 25 ug via INTRAVENOUS

## 2022-01-19 MED ORDER — SODIUM CHLORIDE 0.9% FLUSH: 3.0000 mL | Freq: Two times a day (BID) | INTRAVENOUS | Status: DC

## 2022-01-19 MED ORDER — SODIUM CHLORIDE 0.9 % IV SOLN: 250.0000 mL | INTRAVENOUS | Status: DC | PRN

## 2022-01-19 MED ORDER — SODIUM CHLORIDE 0.9 % IV SOLN: INTRAVENOUS | Status: DC

## 2022-01-19 MED ORDER — MIDAZOLAM HCL 2 MG/2ML IJ SOLN
INTRAMUSCULAR | Status: DC | PRN
  Administered 2022-01-19: 2 mg via INTRAVENOUS

## 2022-01-19 MED ORDER — ONDANSETRON HCL 4 MG/2ML IJ SOLN: 4.0000 mg | Freq: Four times a day (QID) | INTRAMUSCULAR | Status: DC | PRN

## 2022-01-19 MED ORDER — HEPARIN (PORCINE) IN NACL 1000-0.9 UT/500ML-% IV SOLN
INTRAVENOUS | Status: AC
  Filled 2022-01-19: qty 500

## 2022-01-19 MED ORDER — HEPARIN SODIUM (PORCINE) 1000 UNIT/ML IJ SOLN
INTRAMUSCULAR | Status: DC | PRN
  Administered 2022-01-19: 1000 [IU] via INTRAVENOUS

## 2022-01-19 MED ORDER — ROCURONIUM BROMIDE 10 MG/ML (PF) SYRINGE
PREFILLED_SYRINGE | INTRAVENOUS | Status: DC | PRN
  Administered 2022-01-19 (×2): 50 mg via INTRAVENOUS

## 2022-01-19 MED ORDER — PHENYLEPHRINE HCL-NACL 20-0.9 MG/250ML-% IV SOLN
INTRAVENOUS | Status: DC | PRN
  Administered 2022-01-19: 25 ug/min via INTRAVENOUS

## 2022-01-19 MED ORDER — HEPARIN (PORCINE) IN NACL 1000-0.9 UT/500ML-% IV SOLN
INTRAVENOUS | Status: DC | PRN
  Administered 2022-01-19: 500 mL

## 2022-01-19 MED ORDER — ACETAMINOPHEN 325 MG PO TABS: 650.0000 mg | ORAL_TABLET | ORAL | Status: DC | PRN

## 2022-01-19 MED ORDER — ISOPROTERENOL HCL 0.2 MG/ML IJ SOLN
INTRAVENOUS | Status: DC | PRN
  Administered 2022-01-19: 2 ug/min via INTRAVENOUS

## 2022-01-19 MED ORDER — SUGAMMADEX SODIUM 200 MG/2ML IV SOLN
INTRAVENOUS | Status: DC | PRN
  Administered 2022-01-19: 400 mg via INTRAVENOUS

## 2022-01-19 MED ORDER — ONDANSETRON HCL 4 MG/2ML IJ SOLN
INTRAMUSCULAR | Status: DC | PRN
  Administered 2022-01-19: 4 mg via INTRAVENOUS

## 2022-01-19 MED ORDER — SUCCINYLCHOLINE CHLORIDE 200 MG/10ML IV SOSY
PREFILLED_SYRINGE | INTRAVENOUS | Status: DC | PRN
  Administered 2022-01-19: 140 mg via INTRAVENOUS

## 2022-01-19 MED ORDER — APIXABAN 5 MG PO TABS
5.0000 mg | ORAL_TABLET | Freq: Two times a day (BID) | ORAL | Status: DC
  Administered 2022-01-19: 5 mg via ORAL
  Filled 2022-01-19: qty 1

## 2022-01-19 MED ORDER — SODIUM CHLORIDE 0.9% FLUSH: 3.0000 mL | INTRAVENOUS | Status: DC | PRN

## 2022-01-19 MED ORDER — PROPOFOL 10 MG/ML IV BOLUS
INTRAVENOUS | Status: DC | PRN
Start: 2022-01-19 — End: 2022-01-19
  Administered 2022-01-19: 180 mg via INTRAVENOUS
  Administered 2022-01-19: 20 mg via INTRAVENOUS

## 2022-01-19 SURGICAL SUPPLY — 14 items
CATH 8FR REPROCESSED SOUNDSTAR (CATHETERS) ×2 IMPLANT
CATH 8FR SOUNDSTAR REPROCESSED (CATHETERS) IMPLANT
CATH SMTCH THERMOCOOL SF DF (CATHETERS) ×1 IMPLANT
CATH WEB BI DIR CSDF CRV REPRO (CATHETERS) ×1 IMPLANT
CLOSURE PERCLOSE PROSTYLE (VASCULAR PRODUCTS) ×3 IMPLANT
MAT PREVALON FULL STRYKER (MISCELLANEOUS) ×1 IMPLANT
PACK EP LATEX FREE (CUSTOM PROCEDURE TRAY) ×2
PACK EP LF (CUSTOM PROCEDURE TRAY) ×1 IMPLANT
PAD DEFIB RADIO PHYSIO CONN (PAD) ×2 IMPLANT
PATCH CARTO3 (PAD) ×1 IMPLANT
SHEATH PINNACLE 8F 10CM (SHEATH) ×2 IMPLANT
SHEATH PINNACLE 9F 10CM (SHEATH) ×1 IMPLANT
SHEATH PROBE COVER 6X72 (BAG) ×1 IMPLANT
TUBING SMART ABLATE COOLFLOW (TUBING) ×1 IMPLANT

## 2022-01-19 NOTE — Progress Notes (Signed)
Patient was given discharge instructions and verbalized understanding. 

## 2022-01-19 NOTE — Anesthesia Procedure Notes (Addendum)
Procedure Name: Intubation Date/Time: 01/19/2022 7:49 AM Performed by: Barrington Ellison, CRNA Pre-anesthesia Checklist: Patient identified, Emergency Drugs available, Suction available and Patient being monitored Patient Re-evaluated:Patient Re-evaluated prior to induction Oxygen Delivery Method: Circle System Utilized Preoxygenation: Pre-oxygenation with 100% oxygen Induction Type: IV induction Ventilation: Two handed mask ventilation required, Oral airway inserted - appropriate to patient size and Mask ventilation with difficulty Laryngoscope Size: McGraph and 4 Grade View: Grade I Tube type: Oral Number of attempts: 1 Airway Equipment and Method: Stylet and Oral airway Placement Confirmation: ETT inserted through vocal cords under direct vision, positive ETCO2 and breath sounds checked- equal and bilateral Secured at: 23 cm Tube secured with: Tape Dental Injury: Teeth and Oropharynx as per pre-operative assessment  Difficulty Due To: Difficulty was anticipated and Difficult Airway- due to large tongue Future Recommendations: Recommend- induction with short-acting agent, and alternative techniques readily available

## 2022-01-19 NOTE — Transfer of Care (Signed)
Immediate Anesthesia Transfer of Care Note  Patient: Bryan [name]  Procedure(s) Performed: A-FLUTTER ABLATION  Patient Location: Cath Lab  Anesthesia Type:General  Level of Consciousness: awake, alert  and oriented  Airway & Oxygen Therapy: Patient Spontanous Breathing  Post-op Assessment: Report given to RN  Post vital signs: Reviewed and stable  Last Vitals:  Vitals Value Taken Time  BP 123/73 01/19/22 0901  Temp 36.4 C 01/19/22 0901  Pulse 69 01/19/22 0902  Resp 19 01/19/22 0902  SpO2 99 % 01/19/22 0902  Vitals shown include unvalidated device data.  Last Pain:  Vitals:   01/19/22 0901  TempSrc: Temporal  PainSc: 4       Patients Stated Pain Goal: 3 (54/56/25 6389)  Complications: No notable events documented.

## 2022-01-19 NOTE — Anesthesia Preprocedure Evaluation (Signed)
Anesthesia Evaluation  Patient identified by MRN, date of birth, ID band Patient awake    Reviewed: Allergy & Precautions, NPO status , Patient's Chart, lab work & pertinent test results  History of Anesthesia Complications Negative for: history of anesthetic complications  Airway Mallampati: III  TM Distance: >3 FB Neck ROM: Full    Dental  (+) Dental Advisory Given, Teeth Intact   Pulmonary neg shortness of breath, sleep apnea , neg COPD, neg recent URI,    breath sounds clear to auscultation       Cardiovascular hypertension, Pt. on medications (-) angina(-) Past MI + dysrhythmias Atrial Fibrillation  Rhythm:Irregular  1. Left ventricular ejection fraction, by estimation, is 65 to 70%. The  left ventricle has normal function. The left ventricle has no regional  wall motion abnormalities. Left ventricular diastolic parameters are  consistent with Grade I diastolic  dysfunction (impaired relaxation).  2. Right ventricular systolic function is normal. The right ventricular  size is normal. Tricuspid regurgitation signal is inadequate for assessing  PA pressure.  3. The mitral valve is grossly normal. No evidence of mitral valve  regurgitation. No evidence of mitral stenosis.  4. The aortic valve is tricuspid. Aortic valve regurgitation is not  visualized. No aortic stenosis is present.  5. The inferior vena cava is normal in size with greater than 50%  respiratory variability, suggesting right atrial pressure of 3 mmHg.    Neuro/Psych negative neurological ROS  negative psych ROS   GI/Hepatic negative GI ROS,   Endo/Other  Morbid obesity  Renal/GU Renal diseaseLab Results      Component                Value               Date                      CREATININE               0.92                12/31/2021                Musculoskeletal   Abdominal   Peds  Hematology  (+) Blood dyscrasia, , Lab Results       Component                Value               Date                      WBC                      7.9                 12/31/2021                HGB                      15.4                12/31/2021                HCT                      44.8                12/31/2021  MCV                      84                  12/31/2021                PLT                      301                 12/31/2021             eliquis   Anesthesia Other Findings   Reproductive/Obstetrics                             Anesthesia Physical Anesthesia Plan  ASA: 3  Anesthesia Plan: General   Post-op Pain Management: Minimal or no pain anticipated   Induction: Intravenous  PONV Risk Score and Plan: 2 and Ondansetron and Dexamethasone  Airway Management Planned: Oral ETT  Additional Equipment: None  Intra-op Plan:   Post-operative Plan: Extubation in OR  Informed Consent: I have reviewed the patients History and Physical, chart, labs and discussed the procedure including the risks, benefits and alternatives for the proposed anesthesia with the patient or authorized representative who has indicated his/her understanding and acceptance.     Dental advisory given  Plan Discussed with: CRNA and Anesthesiologist  Anesthesia Plan Comments:         Anesthesia Quick Evaluation

## 2022-01-19 NOTE — H&P (Signed)
Electrophysiology Office Note:     Date:  10/14/2021    ID:  Bryan [name], DOB 1961/10/30, MRN [id number]   PCP:  Colon Branch, MD       [facility] Cardiologist:  None  [facility] Electrophysiologist:  None    Referring MD: [place] Barre, PA    Chief Complaint: Atrial flutter   History of Present Illness:     Bryan [name] is a 61 y.o. male who presents for an evaluation of atrial flutter at the request of Adline Peals, PA-C. Their medical history includes obesity, hyperlipidemia, sleep apnea on CPAP.  The patient last saw Adline Peals September 11, 2021.  He first was diagnosed with atrial flutter in June 2022 after his watch alerted him to a rapid heart rate.  He did experience fatigue during this episode.  In the emergency department, ECG showed atrial flutter with 2-1 AV conduction and he underwent a successful cardioversion.  He is maintained on Eliquis for stroke prophylaxis.  He had another presentation to the emergency department on September 06, 2021 and had another cardioversion.  At the appointment with Audry Pili, he reported a 21 pound weight loss.  He continues to work with the weight loss clinic and is reported a steady weight loss to me today.  He is interested in a rhythm control strategy that avoids long-term antiarrhythmic drug therapy.       Objective        Past Medical History:  Diagnosis Date   Allergy     Dental crowns present     Hyperlipidemia     Lower back pain     Nephrolithiasis 2017   Prediabetes 09/04/2014   Seasonal allergies     Sebaceous cyst 04/2013    upper back   Sleep apnea      uses CPAP nightly   Urolithiasis 10/2016           Past Surgical History:  Procedure Laterality Date   ADENOIDECTOMY        as a child   bone marrow donor        under anesthesia   CHOLECYSTECTOMY   01/12/2012    Procedure: LAPAROSCOPIC CHOLECYSTECTOMY WITH INTRAOPERATIVE CHOLANGIOGRAM;  Surgeon: [name], MD;  Location: [facility];  Service: General;   Laterality: N/A;   COLONOSCOPY       CYST EXCISION        cyst removal from back   EAR CYST EXCISION N/A 05/04/2013    Procedure: Excision subcutaneous mass posterior neck;  Surgeon: Imogene Burn. [name], MD;  Location: [facility];  Service: General;  Laterality: N/A;   POLYPECTOMY       wisdom teeth          Current Medications: Active Medications      Current Meds  Medication Sig   atorvastatin (LIPITOR) 10 MG tablet Take 1 tablet (10 mg total) by mouth daily.   azelastine (ASTELIN) 0.1 % nasal spray Place 2 sprays into both nostrils at bedtime as needed for rhinitis. Use in each nostril as directed   buPROPion (WELLBUTRIN SR) 150 MG 12 hr tablet Take 1 tablet (150 mg total) by mouth in the morning.   CO ENZYME Q-10 PO Take by mouth daily.   diltiazem (CARDIZEM) 30 MG tablet Take 1 - 2 tablets by mouth every 6 hours as needed for heart rate greater than 130   fish oil-omega-3 fatty acids 1000 MG capsule Take 1 g by mouth  daily.   hydrochlorothiazide (HYDRODIURIL) 25 MG tablet TAKE 1 TABLET(25 MG) BY MOUTH DAILY   Insulin Pen Needle (BD PEN NEEDLE NANO U/F) 32G X 4 MM MISC 1 each by Does not apply route daily. Use one pen needle once daily to inject Saxenda.   losartan (COZAAR) 50 MG tablet Take 1 tablet (50 mg total) by mouth daily.   Melatonin 1 MG CAPS Take 1 mg by mouth at bedtime as needed.    metFORMIN (GLUCOPHAGE) 500 MG tablet TAKE 1 TABLET(500 MG) BY MOUTH THREE TIMES DAILY   metoprolol succinate (TOPROL XL) 25 MG 24 hr tablet Take 1 tablet (25 mg total) by mouth at bedtime.   Multiple Vitamins-Minerals (CENTRUM PO) Take 1 tablet by mouth daily.   tirzepatide (MOUNJARO) 12.5 MG/0.5ML Pen Inject 12.5 mg into the skin once a week.   Vitamin D, Ergocalciferol, (DRISDOL) 1.25 MG (50000 UNIT) CAPS capsule TAKE 1 CAPSULE BY MOUTH EVERY 7 DAYS        Allergies:   Lisinopril    Social History         Socioeconomic History   Marital status: Married      Spouse  name: Patty   Number of children: 3   Years of education: Not on file   Highest education level: Not on file  Occupational History   Occupation:  manufactoring rep, A/C units       Employer: [employer]  Tobacco Use   Smoking status: Never   Smokeless tobacco: Never  Vaping Use   Vaping Use: Never used  Substance and Sexual Activity   Alcohol use: Yes      Alcohol/week: 3.0 - 4.0 standard drinks      Types: 3 - 4 Glasses of wine per week      Comment: 1 glass wine every other day   Drug use: No   Sexual activity: Not on file  Other Topics Concern   Not on file  Social History Narrative    3 children, 1 at home,  2 in college     Wife w/ breast ca dx 2016, doing well    she developed SZs 2017, sees Dr Jannifer Franklin               Social Determinants of Health    Financial Resource Strain: Not on file  Food Insecurity: Not on file  Transportation Needs: Not on file  Physical Activity: Not on file  Stress: Not on file  Social Connections: Not on file      Family History: The patient's family history includes Cancer in his brother and paternal grandmother; Cancer (age of onset: 67) in his father; Colon cancer (age of onset: 69) in his maternal aunt; Diabetes in his brother; Hyperlipidemia in his mother; Hypertension in his mother; Kidney disease in his father; Obesity in his mother; Ovarian cancer in his mother; Sleep apnea in his father. There is no history of Prostate cancer, Esophageal cancer, Rectal cancer, Stomach cancer, CAD, or Colon polyps.   ROS:   Please see the history of present illness.    All other systems reviewed and are negative.   EKGs/Labs/Other Studies Reviewed:     The following studies were reviewed today:   June 18, 2021 echo Left ventricular function normal, 65% Right ventricular function normal No MR   September 06, 2021 EKG Atrial flutter with a right bundle aberrancy 2-1 AV conduction      September 11, 2021 EKG shows sinus rhythm, right  bundle  branch block, borderline left anterior fascicular block       EKG:  The ekg ordered today demonstrates sinus rhythm, right bundle branch block, left anterior fascicular block     Recent Labs: 05/25/2021: TSH 1.600 09/06/2021: BUN 16; Creatinine, Ser 0.98; Hemoglobin 16.9; Magnesium 2.1; Platelets 380; Potassium 3.8; Sodium 136 09/15/2021: ALT 21  Recent Lipid Panel Labs (Brief)          Component Value Date/Time    CHOL 157 02/10/2021 1416    TRIG 222 (H) 02/10/2021 1416    HDL 41 02/10/2021 1416    CHOLHDL 4 07/07/2019 0835    VLDL 38.2 07/07/2019 0835    LDLCALC 79 02/10/2021 1416    LDLDIRECT 136.0 11/16/2016 0902        Physical Exam:     VS:  BP 116/64    Pulse 74    Ht 5\' 11"  (1.803 m)    Wt (!) 304 lb (137.9 kg)    SpO2 96%    BMI 42.40 kg/m         Wt Readings from Last 3 Encounters:  10/14/21 (!) 304 lb (137.9 kg)  10/01/21 299 lb (135.6 kg)  09/15/21 (!) 304 lb 4 oz (138 kg)      GEN:  Well nourished, well developed in no acute distress.  Obese HEENT: Normal NECK: No JVD; No carotid bruits LYMPHATICS: No lymphadenopathy CARDIAC: RRR, no murmurs, rubs, gallops RESPIRATORY:  Clear to auscultation without rales, wheezing or rhonchi  ABDOMEN: Soft, non-tender, non-distended MUSCULOSKELETAL:  No edema; No deformity  SKIN: Warm and dry NEUROLOGIC:  Alert and oriented x 3 PSYCHIATRIC:  Normal affect          Assessment     ASSESSMENT:     1. Atrial flutter, unspecified type ([place])   2. Essential hypertension   3. Right bundle branch block   4. Obstructive sleep apnea   5. Atrial fibrillation, unspecified type ([place])     PLAN:     In order of problems listed above:     #Atrial flutter, typical Symptomatic.  Frequently with 2 to 1 conduction.  Has had multiple episodes requiring cardioversion.  Is interested in a rhythm control strategy that avoids long-term use of antiarrhythmic drugs.  No evidence of atrial fibrillation thus far.  I  discussed antiarrhythmic drug therapy and catheter ablation in detail with the patient during today's visit.  We discussed the efficacy, procedural details, risks and recovery.  He is interested in pursuing catheter ablation.  I will plan on a 2-week ZIO monitor to assess for any atrial fibrillation.  We discussed how this would change the ablation strategy.  If I do not detect any atrial fibrillation, would plan for a CTI ablation only.  If A. fib is detected, plan for PVI plus CTI.  Risk, benefits, and alternatives to EP study and radiofrequency ablation for afib were also discussed in detail today. These risks include but are not limited to stroke, bleeding, vascular damage, tamponade, perforation, damage to the esophagus, lungs, and other structures, pulmonary vein stenosis, worsening renal function, and death. The patient understands these risk and wishes to proceed.  We will therefore proceed with catheter ablation at the next available time.  Carto, ICE, anesthesia are requested for the procedure.  Will also obtain CT PV protocol prior to the procedure to exclude LAA thrombus and further evaluate atrial anatomy.     #Hypertension Controlled Continue current regimen  #Obstructive sleep apnea Compliant with CPAP therapy  Total time spent with patient today 65 minutes. This includes reviewing records, evaluating the patient and coordinating care.   Medication Adjustments/Labs and Tests Ordered: Current medicines are reviewed at length with the patient today.  Concerns regarding medicines are outlined above.     Orders Placed This Encounter  Procedures   CT CARDIAC MORPH/PULM VEIN W/CM&W/O CA SCORE   Basic Metabolic Panel (BMET)   CBC w/Diff   LONG TERM MONITOR (3-14 DAYS)   EKG 12-Lead      No orders of the defined types were placed in this encounter.       Signed, Hilton Cork. Quentin Ore, MD, [facility], [facility] 10/14/2021 2:26 PM    Electrophysiology [place]       -----------------------------------------  I have seen, examined the patient, and reviewed the above assessment and plan.    Plan for CTI ablation today. Procedure reviewed.    Vickie Epley, MD 01/19/2022 7:19 AM

## 2022-01-19 NOTE — Anesthesia Postprocedure Evaluation (Signed)
Anesthesia Post Note  Patient: [name]  Procedure(s) Performed: A-FLUTTER ABLATION     Patient location during evaluation: PACU Anesthesia Type: General Level of consciousness: awake and alert Pain management: pain level controlled Vital Signs Assessment: post-procedure vital signs reviewed and stable Respiratory status: spontaneous breathing, nonlabored ventilation, respiratory function stable and patient connected to nasal cannula oxygen Cardiovascular status: blood pressure returned to baseline and stable Postop Assessment: no apparent nausea or vomiting Anesthetic complications: no   No notable events documented.  Last Vitals:  Vitals:   01/19/22 1100 01/19/22 1200  BP: 132/67 129/69  Pulse: 66 69  Resp: 19 13  Temp:    SpO2: 97% 98%    Last Pain:  Vitals:   01/19/22 0931  TempSrc: Temporal  PainSc:                  Starlene Consuegra

## 2022-01-19 NOTE — Discharge Instructions (Signed)
Post procedure care instructions No driving for 4 days. No lifting over 5 lbs for 1 week. No vigorous or sexual activity for 1 week. You may return to work/your usual activities on 01/26/22. Keep procedure site clean & dry. If you notice increased pain, swelling, bleeding or pus, call/return!  You may shower after 24 hours, but no soaking in baths/hot tubs/pools for 1 week.

## 2022-01-20 ENCOUNTER — Telehealth (INDEPENDENT_AMBULATORY_CARE_PROVIDER_SITE_OTHER): Payer: Self-pay | Admitting: Family Medicine

## 2022-01-20 ENCOUNTER — Encounter (INDEPENDENT_AMBULATORY_CARE_PROVIDER_SITE_OTHER): Payer: Self-pay

## 2022-01-20 NOTE — Telephone Encounter (Signed)
Prior authorization approved for Saxenda. Patient sent message via mychart.

## 2022-01-29 ENCOUNTER — Other Ambulatory Visit: Payer: Self-pay

## 2022-01-29 ENCOUNTER — Other Ambulatory Visit (INDEPENDENT_AMBULATORY_CARE_PROVIDER_SITE_OTHER): Payer: Self-pay | Admitting: Family Medicine

## 2022-01-29 ENCOUNTER — Ambulatory Visit (INDEPENDENT_AMBULATORY_CARE_PROVIDER_SITE_OTHER): Payer: BC Managed Care – PPO | Admitting: Family Medicine

## 2022-01-29 ENCOUNTER — Encounter (INDEPENDENT_AMBULATORY_CARE_PROVIDER_SITE_OTHER): Payer: Self-pay | Admitting: Family Medicine

## 2022-01-29 VITALS — BP 118/76 | HR 70 | Temp 97.9°F | Ht 71.0 in | Wt 298.0 lb

## 2022-01-29 DIAGNOSIS — Z6841 Body Mass Index (BMI) 40.0 and over, adult: Secondary | ICD-10-CM

## 2022-01-29 DIAGNOSIS — I10 Essential (primary) hypertension: Secondary | ICD-10-CM | POA: Diagnosis not present

## 2022-01-29 DIAGNOSIS — E559 Vitamin D deficiency, unspecified: Secondary | ICD-10-CM

## 2022-01-29 DIAGNOSIS — E669 Obesity, unspecified: Secondary | ICD-10-CM | POA: Diagnosis not present

## 2022-01-29 DIAGNOSIS — F3289 Other specified depressive episodes: Secondary | ICD-10-CM

## 2022-01-29 DIAGNOSIS — Z9189 Other specified personal risk factors, not elsewhere classified: Secondary | ICD-10-CM | POA: Diagnosis not present

## 2022-01-29 MED ORDER — HYDROCHLOROTHIAZIDE 25 MG PO TABS: ORAL_TABLET | ORAL | 0 refills | Status: DC

## 2022-01-29 MED ORDER — BUPROPION HCL ER (SR) 150 MG PO TB12: 150.0000 mg | ORAL_TABLET | Freq: Every morning | ORAL | 0 refills | Status: DC

## 2022-01-29 MED ORDER — VITAMIN D (ERGOCALCIFEROL) 1.25 MG (50000 UNIT) PO CAPS: 50000.0000 [IU] | ORAL_CAPSULE | ORAL | 0 refills | Status: DC

## 2022-01-29 MED ORDER — LOSARTAN POTASSIUM 50 MG PO TABS: 50.0000 mg | ORAL_TABLET | Freq: Every day | ORAL | 0 refills | Status: DC

## 2022-02-03 NOTE — Progress Notes (Signed)
Chief Complaint:   OBESITY Bryan [name] is here to discuss his progress with his obesity treatment plan along with follow-up of his obesity related diagnoses. Mihir is on the Category 3 Plan and states he is following his eating plan approximately 60% of the time. Booker states he is not currently exercising.  Today's visit was #: 46 Starting weight: 326 lbs Starting date: 09/08/2017 Today's weight: 298 lbs Today's date: 01/29/2022 Total lbs lost to date: 28 Total lbs lost since last in-office visit: 0  Interim History: Bryan [name] had cardiac ablation on Feb 13th. Hasn't followed his meal plan as closely as prior. Getting 80 oz of water in per day or more. No issues with the meal plan, just knows he needs to "get back at it".   Subjective:   1. Essential hypertension Bryan [name] has no symptoms or concerns. He denies headache, dizziness, or heart palpitations.  BP Readings from Last 3 Encounters:  01/29/22 118/76  01/19/22 129/69  01/12/22 131/78   2. Vitamin D deficiency Bryan [name] is currently taking prescription vitamin D 50,000 IU each week. He denies nausea, vomiting or muscle weakness.  3. Other depression with emotional eating Bryan [name] is on Wellbutrin, and he is tolerating medication(s) well without side effects.  Medication compliance is good and patient appears to be taking it as prescribed.  The patient denies additional concerns regarding this condition.   4. At risk for heart disease Bryan [name] is at higher than average risk for cardiovascular disease due to history of hypertension, pre-diabetes, and hyperlipidemia.  Assessment/Plan:  No orders of the defined types were placed in this encounter.   Medications Discontinued During This Encounter  Medication Reason   hydrochlorothiazide (HYDRODIURIL) 25 MG tablet Reorder   buPROPion (WELLBUTRIN SR) 150 MG 12 hr tablet Reorder   losartan (COZAAR) 50 MG tablet Reorder   Vitamin D, Ergocalciferol, (DRISDOL) 1.25 MG (50000  UNIT) CAPS capsule Reorder     Meds ordered this encounter  Medications   buPROPion (WELLBUTRIN SR) 150 MG 12 hr tablet    Sig: Take 1 tablet (150 mg total) by mouth in the morning.    Dispense:  30 tablet    Refill:  0    30 d supply;  ** OV for RF **   Do not send RF request   hydrochlorothiazide (HYDRODIURIL) 25 MG tablet    Sig: TAKE 1 TABLET(25 MG) BY MOUTH DAILY    Dispense:  30 tablet    Refill:  0    30 d supply;  ** OV for RF **   Do not send RF request   losartan (COZAAR) 50 MG tablet    Sig: Take 1 tablet (50 mg total) by mouth daily.    Dispense:  30 tablet    Refill:  0    30 d supply;  ** OV for RF **   Do not send RF request   Vitamin D, Ergocalciferol, (DRISDOL) 1.25 MG (50000 UNIT) CAPS capsule    Sig: Take 1 capsule (50,000 Units total) by mouth every Sunday.    Dispense:  4 capsule    Refill:  0    30 d supply;  ** OV for RF **   Do not send RF request     1. Essential hypertension Bryan [name]'s blood pressure is at goal today. We will refill losartan and HCTZ for 1 month. He will continue working on healthy weight loss and exercise to improve blood pressure control. We will watch for signs of hypotension  as he continues his lifestyle modifications.  - hydrochlorothiazide (HYDRODIURIL) 25 MG tablet; TAKE 1 TABLET(25 MG) BY MOUTH DAILY  Dispense: 30 tablet; Refill: 0 - losartan (COZAAR) 50 MG tablet; Take 1 tablet (50 mg total) by mouth daily.  Dispense: 30 tablet; Refill: 0  2. Vitamin D deficiency We will refill prescription Vitamin D 50,000 IU every week for 1 month. Bryan [name] will follow-up for routine testing of Vitamin D, at least 2-3 times per year to avoid over-replacement.  - Vitamin D, Ergocalciferol, (DRISDOL) 1.25 MG (50000 UNIT) CAPS capsule; Take 1 capsule (50,000 Units total) by mouth every Sunday.  Dispense: 4 capsule; Refill: 0  3. Other depression with emotional eating Bryan [name] is stable, controlled with no issues or concerns. We will refill  Wellbutrin SR for 1 month.  - buPROPion (WELLBUTRIN SR) 150 MG 12 hr tablet; Take 1 tablet (150 mg total) by mouth in the morning.  Dispense: 30 tablet; Refill: 0  4. At risk for heart disease Due to Equan's current state of health and medical condition(s), he is at a higher risk for heart disease.  This puts the patient at much greater risk to subsequently develop cardiopulmonary conditions that can significantly affect patient's quality of life in a negative manner.    At least 15 minutes were spent on counseling Bryan [name] about these concerns today, and I stressed the importance of reversing risks factors of obesity, especially truncal and visceral fat, hypertension, hyperlipidemia, and pre-diabetes.  The initial goal is to lose at least 5-10% of starting weight to help reduce these risk factors.  Counseling:  Intensive lifestyle modifications were discussed with Bryan [name] as the most appropriate first line of treatment.  he will continue to work on diet, exercise, and weight loss efforts.  We will continue to reassess these conditions on a fairly regular basis in an attempt to decrease the patient's overall morbidity and mortality.  Evidence-based interventions for health behavior change were utilized today including the discussion of self monitoring techniques, problem-solving barriers, and SMART goal setting techniques.  Specifically, regarding patient's less desirable eating habits and patterns, we employed the technique of small changes when Bryan [name] has not been able to fully commit to his prudent nutritional plan.  5. Obesity with current BMI of 41.6 [name] is currently in the action stage of change. As such, his goal is to continue with weight loss efforts. He has agreed to the Category 3 Plan.   Mindful eating practices was discussed with the patient. With his history of pre-diabetes, I recommended he decrease simple carbohydrates and get back on the plan. Strategies were discussed for meal  prep.  Exercise goals: All adults should avoid inactivity. Some physical activity is better than none, and adults who participate in any amount of physical activity gain some health benefits.  Behavioral modification strategies: increasing lean protein intake, decreasing simple carbohydrates, and meal planning and cooking strategies.  Egan has agreed to follow-up with our clinic in 2 to 4 weeks with [name]. He was informed of the importance of frequent follow-up visits to maximize his success with intensive lifestyle modifications for his multiple health conditions.   Objective:   Blood pressure 118/76, pulse 70, temperature 97.9 F (36.6 C), height 5\' 11"  (1.803 m), weight 298 lb (135.2 kg), SpO2 98 %. Body mass index is 41.56 kg/m.  General: Cooperative, alert, well developed, in no acute distress. HEENT: Conjunctivae and lids unremarkable. Cardiovascular: Regular rhythm.  Lungs: Normal work of breathing. Neurologic: No focal deficits.  Lab Results  Component Value Date   CREATININE 0.92 12/31/2021   BUN 15 12/31/2021   NA 139 12/31/2021   K 3.9 12/31/2021   CL 100 12/31/2021   CO2 28 12/31/2021   Lab Results  Component Value Date   ALT 21 09/15/2021   AST 13 09/15/2021   ALKPHOS 58 03/05/2021   BILITOT 0.2 03/05/2021   Lab Results  Component Value Date   HGBA1C 5.8 09/15/2021   HGBA1C 6.2 (H) 02/10/2021   HGBA1C 6.3 (H) 09/11/2020   HGBA1C 6.3 (H) 05/29/2020   HGBA1C 6.4 (H) 01/10/2020   Lab Results  Component Value Date   INSULIN 22.6 02/10/2021   INSULIN 20.9 05/29/2020   INSULIN 24.0 01/10/2020   INSULIN 21.9 11/17/2018   INSULIN 24.0 08/18/2018   Lab Results  Component Value Date   TSH 1.600 05/25/2021   Lab Results  Component Value Date   CHOL 157 02/10/2021   HDL 41 02/10/2021   LDLCALC 79 02/10/2021   LDLDIRECT 136.0 11/16/2016   TRIG 222 (H) 02/10/2021   CHOLHDL 4 07/07/2019   Lab Results  Component Value Date   VD25OH 55.45  09/15/2021   VD25OH 47.1 02/10/2021   VD25OH 57.5 09/18/2020   Lab Results  Component Value Date   WBC 7.9 12/31/2021   HGB 15.4 12/31/2021   HCT 44.8 12/31/2021   MCV 84 12/31/2021   PLT 301 12/31/2021   No results found for: IRON, TIBC, FERRITIN  Attestation Statements:   Reviewed by clinician on day of visit: allergies, medications, problem list, medical history, surgical history, family history, social history, and previous encounter notes.   Wilhemena Durie, am acting as transcriptionist for [employer], DO.  I have reviewed the above documentation for accuracy and completeness, and I agree with the above. Marjory Sneddon, D.O.  The [place] was signed into law in 2016 which includes the topic of electronic health records.  This provides immediate access to information in MyChart.  This includes consultation notes, operative notes, office notes, lab results and pathology reports.  If you have any questions about what you read please let us know at your next visit so we can discuss your concerns and take corrective action if need be.  We are right here with you.

## 2022-02-15 NOTE — Progress Notes (Deleted)
2/13 atrial flutter ablation ?4 week follow up ?

## 2022-02-17 ENCOUNTER — Ambulatory Visit (INDEPENDENT_AMBULATORY_CARE_PROVIDER_SITE_OTHER): Payer: BC Managed Care – PPO | Admitting: Cardiology

## 2022-02-17 ENCOUNTER — Encounter: Payer: Self-pay | Admitting: Cardiology

## 2022-02-17 ENCOUNTER — Other Ambulatory Visit: Payer: Self-pay

## 2022-02-17 VITALS — BP 124/72 | HR 71 | Ht 71.0 in | Wt 306.8 lb

## 2022-02-17 DIAGNOSIS — I4510 Unspecified right bundle-branch block: Secondary | ICD-10-CM

## 2022-02-17 DIAGNOSIS — G4733 Obstructive sleep apnea (adult) (pediatric): Secondary | ICD-10-CM

## 2022-02-17 DIAGNOSIS — I4892 Unspecified atrial flutter: Secondary | ICD-10-CM

## 2022-02-17 DIAGNOSIS — I10 Essential (primary) hypertension: Secondary | ICD-10-CM

## 2022-02-17 NOTE — Patient Instructions (Signed)
Medication Instructions:  ?Your physician has recommended you make the following change in your medication:  ? ?** You may stop your Eliquis on Saturday,04/18/2022. ? ?*If you need a refill on your cardiac medications before your next appointment, please call your pharmacy* ? ? ?Lab Work: ?None ordered. ? ?If you have labs (blood work) drawn today and your tests are completely normal, you will receive your results only by: ?MyChart Message (if you have MyChart) OR ?A paper copy in the mail ?If you have any lab test that is abnormal or we need to change your treatment, we will call you to review the results. ? ? ?Testing/Procedures: ?None ordered. ? ? ? ?Follow-Up: ?At [facility], you and your health needs are our priority.  As part of our continuing mission to provide you with exceptional heart care, we have created designated Provider Care Teams.  These Care Teams include your primary Cardiologist (physician) and Advanced Practice Providers (APPs -  Physician Assistants and Nurse Practitioners) who all work together to provide you with the care you need, when you need it. ? ?We recommend signing up for the patient portal called "MyChart".  Sign up information is provided on this After Visit Summary.  MyChart is used to connect with patients for Virtual Visits (Telemedicine).  Patients are able to view lab/test results, encounter notes, upcoming appointments, etc.  Non-urgent messages can be sent to your provider as well.   ?To learn more about what you can do with MyChart, go to [email or web address].   ? ?Your next appointment:   ?6 month(s) ? ?The format for your next appointment:   ?In Person ? ?Provider:   ?You will see one of the following Advanced Practice Providers on your designated Care Team:   ?Tommye Standard, PA-C ?Legrand Como "Jonni Sanger" [place], PA-C ? ?

## 2022-02-17 NOTE — Progress Notes (Signed)
?Electrophysiology Office Follow up Visit Note:   ? ?Date:  02/17/2022  ? ?ID:  Bryan [name], DOB 01/13/1961, MRN [id number] ? ?PCP:  Colon Branch, MD  ?[facility] Cardiologist:  None  ?[facility] Electrophysiologist:  Vickie Epley, MD  ? ? ?Interval History:   ? ?Bryan [name] is a 61 y.o. male who presents for a follow up visit. They were last seen in clinic 10/14/2021. ?Since their last appointment, they underwent atrial flutter ablation on 01/19/2022. ? ?Overall, he is feeling okay. He denies any recurrent episodes of arrhythmia. Generally he felt "sluggish" during his previous episodes, which he now knows to monitor for this symptom. ? ?In the past 4-5 months he has noticed his blood pressure is well controlled. ? ?For exercise he usually walks outdoors or on the treadmill. ? ?He remains compliant with Eliquis, morning and night doses. No bleeding issues. ? ?He denies any chest pain, shortness of breath, or peripheral edema. No lightheadedness, headaches, syncope, orthopnea, or PND. ? ?In his family, his mother has atrial fibrillation. ? ?  ? ?Past Medical History:  ?Diagnosis Date  ? Allergy   ? Dental crowns present   ? Hyperlipidemia   ? Lower back pain   ? Nephrolithiasis 2017  ? Prediabetes 09/04/2014  ? Seasonal allergies   ? Sebaceous cyst 04/2013  ? upper back  ? Sleep apnea   ? uses CPAP nightly  ? Urolithiasis 10/2016  ? ? ?Past Surgical History:  ?Procedure Laterality Date  ? A-FLUTTER ABLATION N/A 01/19/2022  ? Procedure: A-FLUTTER ABLATION;  Surgeon: Vickie Epley, MD;  Location: [facility];  Service: Cardiovascular;  Laterality: N/A;  ? ADENOIDECTOMY    ? as a child  ? bone marrow donor    ? under anesthesia  ? CHOLECYSTECTOMY  01/12/2012  ? Procedure: LAPAROSCOPIC CHOLECYSTECTOMY WITH INTRAOPERATIVE CHOLANGIOGRAM;  Surgeon: Imogene Burn. [name], MD;  Location: [facility];  Service: General;  Laterality: N/A;  ? COLONOSCOPY    ? CYST EXCISION    ? cyst removal from back  ? EAR CYST  EXCISION N/A 05/04/2013  ? Procedure: Excision subcutaneous mass posterior neck;  Surgeon: Imogene Burn. [name], MD;  Location: [facility];  Service: General;  Laterality: N/A;  ? POLYPECTOMY    ? wisdom teeth    ? ? ?Current Medications: ?Current Meds  ?Medication Sig  ? apixaban (ELIQUIS) 5 MG TABS tablet Take 1 tablet (5 mg total) by mouth 2 (two) times daily.  ? atorvastatin (LIPITOR) 10 MG tablet Take 1 tablet (10 mg total) by mouth daily.  ? atorvastatin (LIPITOR) 20 MG tablet Take 20 mg by mouth every evening.  ? azelastine (ASTELIN) 0.1 % nasal spray Place 2 sprays into both nostrils at bedtime as needed for rhinitis. Use in each nostril as directed  ? buPROPion (WELLBUTRIN SR) 150 MG 12 hr tablet Take 1 tablet (150 mg total) by mouth in the morning.  ? CO ENZYME Q-10 PO Take 1 capsule by mouth in the morning.  ? diltiazem (CARDIZEM) 30 MG tablet Take 1 - 2 tablets by mouth every 6 hours as needed for heart rate greater than 130  ? fish oil-omega-3 fatty acids 1000 MG capsule Take 1 g by mouth in the morning.  ? hydrochlorothiazide (HYDRODIURIL) 25 MG tablet TAKE 1 TABLET(25 MG) BY MOUTH DAILY  ? losartan (COZAAR) 50 MG tablet Take 1 tablet (50 mg total) by mouth daily.  ? Melatonin 3 MG TBDP Take  5 mg by mouth at bedtime.  ? metFORMIN (GLUCOPHAGE) 500 MG tablet TAKE 1 TABLET(500 MG) BY MOUTH THREE TIMES DAILY  ? metoprolol succinate (TOPROL-XL) 25 MG 24 hr tablet TAKE 1 TABLET(25 MG) BY MOUTH AT BEDTIME  ? Multiple Vitamins-Minerals (CENTRUM PO) Take 1 tablet by mouth in the morning.  ? Niacin (VITAMIN B-3 PO) Take 1 tablet by mouth in the morning.  ? promethazine-codeine (PHENERGAN WITH CODEINE) 6.25-10 MG/5ML syrup Take 5 mLs by mouth every 6 (six) hours as needed for cough.  ? Vitamin D, Ergocalciferol, (DRISDOL) 1.25 MG (50000 UNIT) CAPS capsule Take 1 capsule (50,000 Units total) by mouth every Sunday.  ?  ? ?Allergies:   Lisinopril  ? ?Social History  ? ?Socioeconomic History  ? Marital  status: Married  ?  Spouse name: Chong Sicilian  ? Number of children: 3  ? Years of education: Not on file  ? Highest education level: Not on file  ?Occupational History  ? Occupation:  manufactoring rep, A/C units   ?  Employer: [employer]  ?Tobacco Use  ? Smoking status: Never  ? Smokeless tobacco: Never  ?Vaping Use  ? Vaping Use: Never used  ?Substance and Sexual Activity  ? Alcohol use: Yes  ?  Alcohol/week: 3.0 - 4.0 standard drinks  ?  Types: 3 - 4 Glasses of wine per week  ?  Comment: 1 glass wine every other day  ? Drug use: No  ? Sexual activity: Not on file  ?Other Topics Concern  ? Not on file  ?Social History Narrative  ? 3 children, 1 at home,  2 in college   ? Wife w/ breast ca dx 2016, doing well  ? she developed SZs 2017, sees Dr Jannifer Franklin   ?   ?   ? ?Social Determinants of Health  ? ?Financial Resource Strain: Not on file  ?Food Insecurity: Not on file  ?Transportation Needs: Not on file  ?Physical Activity: Not on file  ?Stress: Not on file  ?Social Connections: Not on file  ?  ? ?Family History: ?The patient's family history includes Cancer in his brother and paternal grandmother; Cancer (age of onset: 91) in his father; Colon cancer (age of onset: 40) in his maternal aunt; Diabetes in his brother; Hyperlipidemia in his mother; Hypertension in his mother; Kidney disease in his father; Obesity in his mother; Ovarian cancer in his mother; Sleep apnea in his father. There is no history of Prostate cancer, Esophageal cancer, Rectal cancer, Stomach cancer, CAD, or Colon polyps. ? ?ROS:   ?Please see the history of present illness.    ?All other systems reviewed and are negative. ? ?EKGs/Labs/Other Studies Reviewed:   ? ?The following studies were reviewed today: ? ?A-Flutter Ablation 01/19/2022: ?CONCLUSIONS:  ? 1.  Symptomatic typical atrial flutter ? 2. Successful radiofrequency ablation of atrial flutter along the cavotricuspid isthmus with complete bidirectional isthmus block achieved.  ? 3. No  inducible arrhythmias following ablation.  ? 4. No early apparent complications.  ? ?Cardiac CTA 01/12/2022: ?FINDINGS: ?Image quality: Average. ?  ?Noise artifact is: severe. ?  ?Pulmonary Veins: There is normal pulmonary vein drainage into the ?left atrium (2 on the right and 2 on the left) with ostial ?measurements as follows: ?  ?RUPV: Ostium 22.25m x 21.291m area 3.65cm2 ?  ?RLPV:  Ostium 22.96m75m 19.3mm62mrea 3.32cm2 ?  ?LUPV:  Ostium 35.1mm 38m3.1mm a69m 5.65cm2 ?  ?LLPV:  Ostium 16.9mm x 45m96mm  ar4m1.89cm2 ?  ?  Left Atrium: The left atrial size is normal. There is no obvious ?PFO/ASD but images are Fair due to noise artifact which reduces ?sensitivity. The left atrial appendage is large broccoli type with ?two lobes. There is no obvious thrombus in the left atrial appendage ?on contrast or delayed imaging but significant noise artifact limits ?accuracy. The esophagus runs in the left atrial midline and is not ?in proximity to any of the pulmonary vein ostia. ?  ?Coronary Arteries: CAC score of 0, which is 0 percentile for age-, ?race-, and sex-matched controls. Normal coronary origin. Right ?dominance. The study was performed without use of NTG and is ?insufficient for plaque evaluation. ?  ?Right Atrium: Right atrial size is within normal limits. ?  ?Right Ventricle: The right ventricular cavity is within normal ?limits. ?  ?Left Ventricle: The ventricular cavity size is within normal limits. ?There are no stigmata of prior infarction. There is no abnormal ?filling defect. ?  ?Pericardium: Normal thickness with no significant effusion or ?calcium present. ?  ?Pulmonary Artery: Normal caliber without proximal filling defect. ?  ?Cardiac valves: The aortic valve is trileaflet without significant ?calcification. The mitral valve is normal structure without ?significant calcification. ?  ?Aorta: Normal caliber with no significant disease. ?  ?Extra-cardiac findings: See attached radiology report for ?non-cardiac  structures. ?  ?IMPRESSION: ?1. There is normal pulmonary vein drainage into the left atrium with ?ostial measurements above. ?  ?2. There is no thrombus in the left atrial appendage but significant ?noise a

## 2022-02-18 ENCOUNTER — Encounter (INDEPENDENT_AMBULATORY_CARE_PROVIDER_SITE_OTHER): Payer: Self-pay | Admitting: Family Medicine

## 2022-02-18 ENCOUNTER — Ambulatory Visit (INDEPENDENT_AMBULATORY_CARE_PROVIDER_SITE_OTHER): Payer: BC Managed Care – PPO | Admitting: Family Medicine

## 2022-02-18 VITALS — BP 120/76 | HR 74 | Temp 98.0°F | Ht 71.0 in | Wt 299.0 lb

## 2022-02-18 DIAGNOSIS — E7849 Other hyperlipidemia: Secondary | ICD-10-CM | POA: Diagnosis not present

## 2022-02-18 DIAGNOSIS — I10 Essential (primary) hypertension: Secondary | ICD-10-CM | POA: Diagnosis not present

## 2022-02-18 DIAGNOSIS — E669 Obesity, unspecified: Secondary | ICD-10-CM

## 2022-02-18 DIAGNOSIS — Z6841 Body Mass Index (BMI) 40.0 and over, adult: Secondary | ICD-10-CM

## 2022-02-18 DIAGNOSIS — R7303 Prediabetes: Secondary | ICD-10-CM

## 2022-02-18 MED ORDER — SAXENDA 18 MG/3ML ~~LOC~~ SOPN: 3.0000 mg | PEN_INJECTOR | Freq: Every day | SUBCUTANEOUS | 0 refills | Status: DC

## 2022-02-21 ENCOUNTER — Other Ambulatory Visit (INDEPENDENT_AMBULATORY_CARE_PROVIDER_SITE_OTHER): Payer: Self-pay | Admitting: Family Medicine

## 2022-02-21 DIAGNOSIS — I10 Essential (primary) hypertension: Secondary | ICD-10-CM

## 2022-02-23 NOTE — Telephone Encounter (Signed)
LAST APPOINTMENT DATE: 02/18/22 ?NEXT APPOINTMENT DATE: 03/11/22 ? ? ?[id number] [address], [address] AT [facility] [address] ?[place] ?[place] [address] ?Phone: [phone] Fax: [phone] ? ?[facility] ?[address] [place] [address] ?Phone: [phone] Fax: [phone] ? ?Patient is requesting a refill of the following medications: ?Requested Prescriptions  ? ?Pending Prescriptions Disp Refills  ? hydrochlorothiazide (HYDRODIURIL) 25 MG tablet [Pharmacy Med Name: HYDROCHLOROTHIAZIDE '25MG'$  TABLETS] 30 tablet 0  ?  Sig: TAKE 1 TABLET(25 MG) BY MOUTH DAILY  ? ? ?Date last filled: 01/29/22 ?Previously prescribed by Dr. Raliegh Scarlet ? ?Lab Results  ?Component Value Date  ? HGBA1C 5.8 09/15/2021  ? HGBA1C 6.2 (H) 02/10/2021  ? HGBA1C 6.3 (H) 09/11/2020  ? ?Lab Results  ?Component Value Date  ? [place] 79 02/10/2021  ? CREATININE 0.92 12/31/2021  ? ?Lab Results  ?Component Value Date  ? VD25OH 55.45 09/15/2021  ? VD25OH 47.1 02/10/2021  ? VD25OH 57.5 09/18/2020  ? ? ?BP Readings from Last 3 Encounters:  ?02/18/22 120/76  ?02/17/22 124/72  ?01/29/22 118/76  ? ? ?

## 2022-02-23 NOTE — Progress Notes (Signed)
? ? ? ?Chief Complaint:  ? ?OBESITY ?Bryan [name] is here to discuss his progress with his obesity treatment plan along with follow-up of his obesity related diagnoses. Bryan [name] is on the Category 3 Plan and states he is following his eating plan approximately 65% of the time. Bryan [name] states he is walking for 30-45 minutes 4 times per week. ? ?Today's visit was #: 63 ?Starting weight: 326 lbs ?Starting date: 09/08/2017 ?Today's weight: 299 lbs ?Today's date: 02/18/2022 ?Total lbs lost to date: 56 ?Total lbs lost since last in-office visit: 0 ? ?Interim History: Bryan [name] notes the last month has been "crazy". He had an ablation 1 month ago. He had a follow up appointment with Cardiology yesterday, and was released back to exercising. He is taking Saxenda 2.4 mg once weekly. He denies side effects. He doesn't feel hungry but he is not sure if Bryan [name] is decreasing his appetite as well as Mounjaro did. ? ?Subjective:  ? ?1. Pre-diabetes ?Captain's last A1c looked better at 5.8. He has taken [facility] in the past and stopped due to coverage and cost. ? ?2. Essential hypertension ?Bryan [name]'s blood pressure looks good today. He saw Cardiology yesterday, and he was released to start back exercising. ? ?3. Other hyperlipidemia ?Bryan [name] is taking Lipitor 20 mg, and he denies side effects. ? ?Assessment/Plan:  ? ?1. Pre-diabetes ?Nazar will continue working on dietary changes, exercise, and weight loss to help decrease the risk of diabetes.  ? ?2. Essential hypertension ?Bryan [name] will continue to follow up with Cardiology, and he will continue his medications as directed. We will follow as he continues his lifestyle modifications. ? ?3. Other hyperlipidemia ?Cardiovascular risk and specific lipid/LDL goals reviewed. We discussed several lifestyle modifications today. Bryan [name] will continue to follow up with Cardiology, and he will continue his medications as directed. Orders and follow up as documented in patient record.  ? ?4. Obesity  with current BMI of 41.8 ?Bryan [name] is currently in the action stage of change. As such, his goal is to continue with weight loss efforts. He has agreed to the Category 3 Plan.  ? ?We discussed various medication options to help Bryan [name] with his weight loss efforts and we both agreed to continue Saxenda 2.4 mg, and we will refill for 1 month. ? ?- Liraglutide -Weight Management (SAXENDA) 18 MG/3ML SOPN; Inject 3 mg into the skin daily.  Dispense: 3 mL; Refill: 0 ? ?Exercise goals: As is. Plans to start resistance training.  ? ?Behavioral modification strategies: increasing lean protein intake, increasing water intake, no skipping meals, and meal planning and cooking strategies. ? ?Bryan [name] has agreed to follow-up with our clinic in 3 weeks. He was informed of the importance of frequent follow-up visits to maximize his success with intensive lifestyle modifications for his multiple health conditions.  ? ?Objective:  ? ?Blood pressure 120/76, pulse 74, temperature 98 ?F (36.7 ?C), height '5\' 11"'$  (1.803 m), weight 299 lb (135.6 kg), SpO2 97 %. ?Body mass index is 41.7 kg/m?. ? ?General: Cooperative, alert, well developed, in no acute distress. ?HEENT: Conjunctivae and lids unremarkable. ?Cardiovascular: Regular rhythm.  ?Lungs: Normal work of breathing. ?Neurologic: No focal deficits.  ? ?Lab Results  ?Component Value Date  ? CREATININE 0.92 12/31/2021  ? BUN 15 12/31/2021  ? NA 139 12/31/2021  ? K 3.9 12/31/2021  ? CL 100 12/31/2021  ? CO2 28 12/31/2021  ? ?Lab Results  ?Component Value Date  ? ALT 21 09/15/2021  ? AST 13 09/15/2021  ? ALKPHOS 58 03/05/2021  ?  BILITOT 0.2 03/05/2021  ? ?Lab Results  ?Component Value Date  ? HGBA1C 5.8 09/15/2021  ? HGBA1C 6.2 (H) 02/10/2021  ? HGBA1C 6.3 (H) 09/11/2020  ? HGBA1C 6.3 (H) 05/29/2020  ? HGBA1C 6.4 (H) 01/10/2020  ? ?Lab Results  ?Component Value Date  ? INSULIN 22.6 02/10/2021  ? INSULIN 20.9 05/29/2020  ? INSULIN 24.0 01/10/2020  ? INSULIN 21.9 11/17/2018  ? INSULIN 24.0  08/18/2018  ? ?Lab Results  ?Component Value Date  ? TSH 1.600 05/25/2021  ? ?Lab Results  ?Component Value Date  ? CHOL 157 02/10/2021  ? HDL 41 02/10/2021  ? [place] 79 02/10/2021  ? LDLDIRECT 136.0 11/16/2016  ? TRIG 222 (H) 02/10/2021  ? CHOLHDL 4 07/07/2019  ? ?Lab Results  ?Component Value Date  ? VD25OH 55.45 09/15/2021  ? VD25OH 47.1 02/10/2021  ? VD25OH 57.5 09/18/2020  ? ?Lab Results  ?Component Value Date  ? WBC 7.9 12/31/2021  ? HGB 15.4 12/31/2021  ? HCT 44.8 12/31/2021  ? MCV 84 12/31/2021  ? PLT 301 12/31/2021  ? ?No results found for: IRON, TIBC, FERRITIN ? ?Attestation Statements:  ? ?Reviewed by clinician on day of visit: allergies, medications, problem list, medical history, surgical history, family history, social history, and previous encounter notes. ? ?Time spent on visit including pre-visit chart review and post-visit care and charting was 30 minutes.  ? ? ?I, Trixie Dredge, am acting as transcriptionist for Dennard Nip, MD. ? ?I have reviewed the above documentation for accuracy and completeness, and I agree with the above. -  Dennard Nip, MD ? ? ?

## 2022-02-28 ENCOUNTER — Other Ambulatory Visit (INDEPENDENT_AMBULATORY_CARE_PROVIDER_SITE_OTHER): Payer: Self-pay | Admitting: Family Medicine

## 2022-02-28 DIAGNOSIS — R632 Polyphagia: Secondary | ICD-10-CM

## 2022-03-02 ENCOUNTER — Encounter (INDEPENDENT_AMBULATORY_CARE_PROVIDER_SITE_OTHER): Payer: Self-pay | Admitting: Family Medicine

## 2022-03-02 ENCOUNTER — Other Ambulatory Visit (INDEPENDENT_AMBULATORY_CARE_PROVIDER_SITE_OTHER): Payer: Self-pay | Admitting: Family Medicine

## 2022-03-02 DIAGNOSIS — R632 Polyphagia: Secondary | ICD-10-CM

## 2022-03-02 MED ORDER — SAXENDA 18 MG/3ML ~~LOC~~ SOPN: 3.0000 mg | PEN_INJECTOR | Freq: Every day | SUBCUTANEOUS | 0 refills | Status: DC

## 2022-03-02 MED ORDER — METFORMIN HCL 500 MG PO TABS: ORAL_TABLET | ORAL | 0 refills | Status: DC

## 2022-03-02 NOTE — Telephone Encounter (Signed)
Last fill for Metformin 11/27/21 #90 ?Saxenda 02/18/22  #40m ?Last OV 02/18/22 ?Next OV 03/11/22 ?

## 2022-03-11 ENCOUNTER — Encounter (INDEPENDENT_AMBULATORY_CARE_PROVIDER_SITE_OTHER): Payer: Self-pay | Admitting: Family Medicine

## 2022-03-11 ENCOUNTER — Ambulatory Visit (INDEPENDENT_AMBULATORY_CARE_PROVIDER_SITE_OTHER): Payer: BC Managed Care – PPO | Admitting: Family Medicine

## 2022-03-11 ENCOUNTER — Other Ambulatory Visit (INDEPENDENT_AMBULATORY_CARE_PROVIDER_SITE_OTHER): Payer: Self-pay | Admitting: Family Medicine

## 2022-03-11 VITALS — BP 114/75 | HR 70 | Temp 98.1°F | Ht 71.0 in | Wt 299.0 lb

## 2022-03-11 DIAGNOSIS — E669 Obesity, unspecified: Secondary | ICD-10-CM

## 2022-03-11 DIAGNOSIS — I10 Essential (primary) hypertension: Secondary | ICD-10-CM

## 2022-03-11 DIAGNOSIS — E559 Vitamin D deficiency, unspecified: Secondary | ICD-10-CM

## 2022-03-11 DIAGNOSIS — F3289 Other specified depressive episodes: Secondary | ICD-10-CM | POA: Diagnosis not present

## 2022-03-11 DIAGNOSIS — Z9189 Other specified personal risk factors, not elsewhere classified: Secondary | ICD-10-CM

## 2022-03-11 DIAGNOSIS — Z6841 Body Mass Index (BMI) 40.0 and over, adult: Secondary | ICD-10-CM

## 2022-03-11 MED ORDER — VITAMIN D (ERGOCALCIFEROL) 1.25 MG (50000 UNIT) PO CAPS: 50000.0000 [IU] | ORAL_CAPSULE | ORAL | 0 refills | Status: DC

## 2022-03-11 MED ORDER — LOSARTAN POTASSIUM 50 MG PO TABS: 50.0000 mg | ORAL_TABLET | Freq: Every day | ORAL | 0 refills | Status: DC

## 2022-03-11 MED ORDER — BUPROPION HCL ER (SR) 150 MG PO TB12: 150.0000 mg | ORAL_TABLET | Freq: Every morning | ORAL | 0 refills | Status: DC

## 2022-03-11 MED ORDER — HYDROCHLOROTHIAZIDE 25 MG PO TABS: ORAL_TABLET | ORAL | 0 refills | Status: DC

## 2022-03-16 NOTE — Progress Notes (Signed)
? ? ? ?Chief Complaint:  ? ?OBESITY ?Bryan [name] is here to discuss his progress with his obesity treatment plan along with follow-up of his obesity related diagnoses. Bryan [name] is on the Category 3 Plan and states he is following his eating plan approximately 65% of the time. Bryan [name] states he is on treadmill for 30 minutes 4 times per week. ? ?Today's visit was #: 90 ?Starting weight: 326 lbs ?Starting date: 09/08/2017 ?Today's weight: 299 lbs ?Today's date: 03/11/2022 ?Total lbs lost to date: 81 ?Total lbs lost since last in-office visit: 0 ? ?Interim History: Bryan [name] endorses not following the meal plan as closely as he would have like to in order to see results he wants. No issues with the meal plan. ? ?Subjective:  ? ?1. Essential hypertension ?Bryan [name] is tolerating medication(s) well without side effects.  Medication compliance is good as patient endorses taking it as prescribed.  The patient denies additional concerns regarding this condition. Asx with no concerns. His blood pressure is at goal. BMP within normal limits approximately 2 months ago.   ? ?2. Vitamin D deficiency ?He is currently taking prescription vitamin D 50,000 IU each week. He denies nausea, vomiting or muscle weakness. Last Vitamin D level was 55.45 in 09/2021. ? ?3. Other depression with emotional eating ?Bryan [name] mood is stable. He denies depressed mood with no concerns.  ? ?4. At risk for impaired metabolic function ?Bryan [name] is at increased risk for impaired metabolic function due to pre-diabetes.  ? ?Assessment/Plan:  ?No orders of the defined types were placed in this encounter. ? ? ?Medications Discontinued During This Encounter  ?Medication Reason  ? buPROPion (WELLBUTRIN SR) 150 MG 12 hr tablet Reorder  ? losartan (COZAAR) 50 MG tablet Reorder  ? Vitamin D, Ergocalciferol, (DRISDOL) 1.25 MG (50000 UNIT) CAPS capsule Reorder  ? hydrochlorothiazide (HYDRODIURIL) 25 MG tablet Reorder  ?  ? ?Meds ordered this encounter  ?Medications   ? Vitamin D, Ergocalciferol, (DRISDOL) 1.25 MG (50000 UNIT) CAPS capsule  ?  Sig: Take 1 capsule (50,000 Units total) by mouth every Sunday.  ?  Dispense:  4 capsule  ?  Refill:  0  ?  30 d supply;  ** OV for RF **   Do not send RF request  ? buPROPion (WELLBUTRIN SR) 150 MG 12 hr tablet  ?  Sig: Take 1 tablet (150 mg total) by mouth in the morning.  ?  Dispense:  30 tablet  ?  Refill:  0  ?  30 d supply;  ** OV for RF **   Do not send RF request  ? hydrochlorothiazide (HYDRODIURIL) 25 MG tablet  ?  Sig: 1 po qd  ?  Dispense:  30 tablet  ?  Refill:  0  ? losartan (COZAAR) 50 MG tablet  ?  Sig: Take 1 tablet (50 mg total) by mouth daily.  ?  Dispense:  30 tablet  ?  Refill:  0  ?  30 d supply;  ** OV for RF **   Do not send RF request  ?  ? ?1. Essential hypertension ?We will refill HCTZ and losartan for 1 month. Bryan [name] is to check his blood pressure 2 days per week or so at home and keep a log. He is to decrease salt and follow his prudent nutritional plan.  ? ?- hydrochlorothiazide (HYDRODIURIL) 25 MG tablet; 1 po qd  Dispense: 30 tablet; Refill: 0 ?- losartan (COZAAR) 50 MG tablet; Take 1 tablet (50 mg total) by  mouth daily.  Dispense: 30 tablet; Refill: 0 ? ?2. Vitamin D deficiency ?We will refill prescription Vitamin D for 1 month, and will consider rechecking his Vitamin D level in the near future. ? ?- I again reiterated the importance of vitamin D (as well as calcium) to their health and wellbeing.  ?- I reviewed possible symptoms of low Vitamin D:  low energy, depressed mood, muscle aches, joint aches, osteoporosis etc. ?- low Vitamin D levels may be linked to an increased risk of cardiovascular events and even increased risk of cancers- such as colon and breast.  ?- ideal vitamin D levels reviewed with patient  ?- I recommend pt take a weekly prescription vit D - see script below   ?- Informed patient this may be a lifelong thing, and he was encouraged to continue to take the medicine until told  otherwise.    ?- weight loss will likely improve availability of vitamin D, thus encouraged Tanyon to continue with meal plan and their weight loss efforts to further improve this condition.  Thus, we will need to monitor levels regularly (every 3-4 mo on average) to keep levels within normal limits and prevent over supplementation. ?- pt's questions and concerns regarding this condition addressed. ? ?- Vitamin D, Ergocalciferol, (DRISDOL) 1.25 MG (50000 UNIT) CAPS capsule; Take 1 capsule (50,000 Units total) by mouth every Sunday.  Dispense: 4 capsule; Refill: 0 ? ?3. Other depression with emotional eating ?We will refill Wellbutrin for 1 month, no change in dose. Bryan [name] will continue  his prudent nutritional plan and increase his activity. ? ?- buPROPion (WELLBUTRIN SR) 150 MG 12 hr tablet; Take 1 tablet (150 mg total) by mouth in the morning.  Dispense: 30 tablet; Refill: 0 ? ?4. At risk for impaired metabolic function ?Due to Bryan [name]'s current state of health and medical condition(s), he is at a significantly higher risk for impaired metabolic function.   At least 10 minutes was spent on counseling Bryan [name] about these concerns today.  This places the patient at a much greater risk to subsequently develop cardio-pulmonary conditions that can negatively affect the patient's quality of life.  I stressed the importance of reversing these risks factors.  The initial goal is to lose at least 5-10% of starting weight to help reduce risk factors.  Counseling:  Intensive lifestyle modifications discussed with Bryan [name] as the most appropriate first line treatment.  he will continue to work on diet, exercise, and weight loss efforts.  We will continue to reassess these conditions on a fairly regular basis in an attempt to decrease the patient's overall morbidity and mortality. ? ?5. Obesity with current BMI of 41.8 ?Bryan [name] is currently in the action stage of change. As such, his goal is to continue with weight loss  efforts. He has agreed to the Category 3 Plan.  ? ?Continue on Saxenda 2.4 mg recently has a refill and he is tolerating it well with no issues. He denies hunger or cravings when on the meal plan.  ? ?Exercise goals: For substantial health benefits, adults should do at least 150 minutes (2 hours and 30 minutes) a week of moderate-intensity, or 75 minutes (1 hour and 15 minutes) a week of vigorous-intensity aerobic physical activity, or an equivalent combination of moderate- and vigorous-intensity aerobic activity. Aerobic activity should be performed in episodes of at least 10 minutes, and preferably, it should be spread throughout the week. Increase as tolerated.  ? ?Meal prepping was discussed and recipes given. ? ?Behavioral modification strategies:  decreasing sodium intake, meal planning and cooking strategies, and planning for success. ? ?Bryan [name] has agreed to follow-up with our clinic in 3 to 4 weeks. He was informed of the importance of frequent follow-up visits to maximize his success with intensive lifestyle modifications for his multiple health conditions.  ? ?Objective:  ? ?Blood pressure 114/75, pulse 70, temperature 98.1 ?F (36.7 ?C), height '5\' 11"'$  (1.803 m), weight 299 lb (135.6 kg), SpO2 98 %. ?Body mass index is 41.7 kg/m?. ? ?General: Cooperative, alert, well developed, in no acute distress. ?HEENT: Conjunctivae and lids unremarkable. ?Cardiovascular: Regular rhythm.  ?Lungs: Normal work of breathing. ?Neurologic: No focal deficits.  ? ?Lab Results  ?Component Value Date  ? CREATININE 0.92 12/31/2021  ? BUN 15 12/31/2021  ? NA 139 12/31/2021  ? K 3.9 12/31/2021  ? CL 100 12/31/2021  ? CO2 28 12/31/2021  ? ?Lab Results  ?Component Value Date  ? ALT 21 09/15/2021  ? AST 13 09/15/2021  ? ALKPHOS 58 03/05/2021  ? BILITOT 0.2 03/05/2021  ? ?Lab Results  ?Component Value Date  ? HGBA1C 5.8 09/15/2021  ? HGBA1C 6.2 (H) 02/10/2021  ? HGBA1C 6.3 (H) 09/11/2020  ? HGBA1C 6.3 (H) 05/29/2020  ? HGBA1C 6.4 (H)  01/10/2020  ? ?Lab Results  ?Component Value Date  ? INSULIN 22.6 02/10/2021  ? INSULIN 20.9 05/29/2020  ? INSULIN 24.0 01/10/2020  ? INSULIN 21.9 11/17/2018  ? INSULIN 24.0 08/18/2018  ? ?Lab Results  ?Component Val

## 2022-03-17 IMAGING — CT CT HEART MORPH/PULM VEIN W/ CM & W/O CA SCORE
2 of 6 series · 12 of 20 positions shown, 14 images · IV contrast (Omni 300)
Comparison: None.

Addendum:
EXAM:
Cardiac CTA
TECHNIQUE: A non-contrast, gated CT scan was obtained with axial slices of 3 mm
through the heart for calcium scoring. Calcium scoring was performed
using the Agatston method. A 120 kV retrospective, gated, contrast
cardiac scan was obtained. Gantry rotation speed was 250 msecs and
collimation was 0.6 mm. Nitroglycerin was not given. A delayed scan
was obtained to exclude left atrial appendage thrombus. The 3D
dataset was reconstructed in 5% intervals of the 0-95% of the R-R
cycle. Late systolic phases were analyzed on a dedicated workstation
using MPR, MIP, and VRT modes. The patient received 80 cc of
contrast.

[Series 7: 0-90% · axial · 0.44mm/px · z∈[-368,-246]mm · 6 of 3410 slices shown, 8 images]
[im 488/3410  vessel]
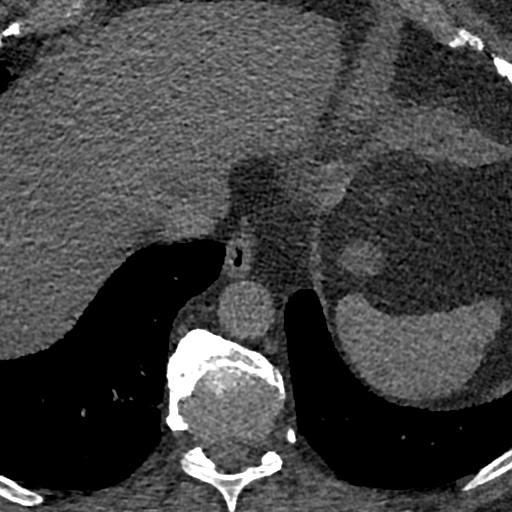
[im 488/3410  lung]
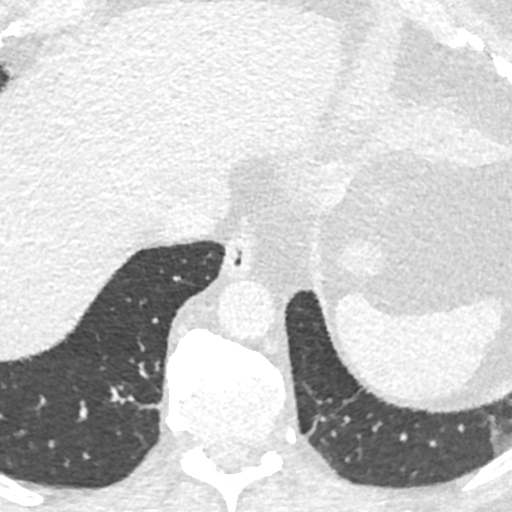
[im 975/3410  vessel]
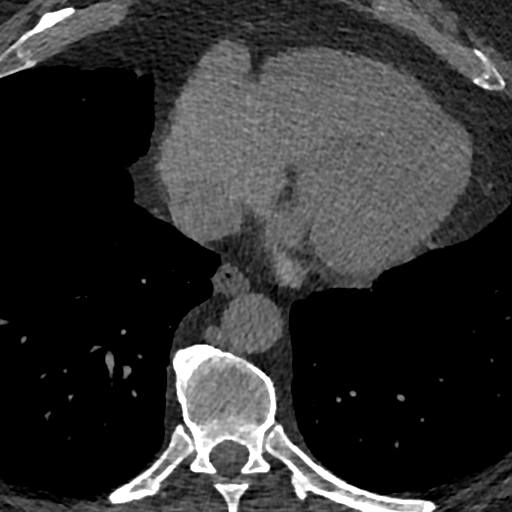
[im 1462/3410  vessel]
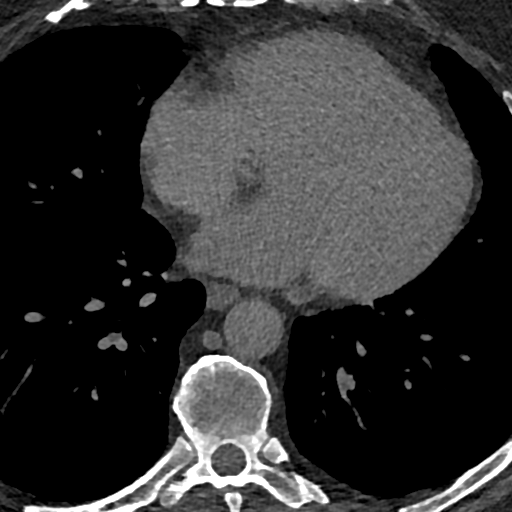
[im 1949/3410  vessel]
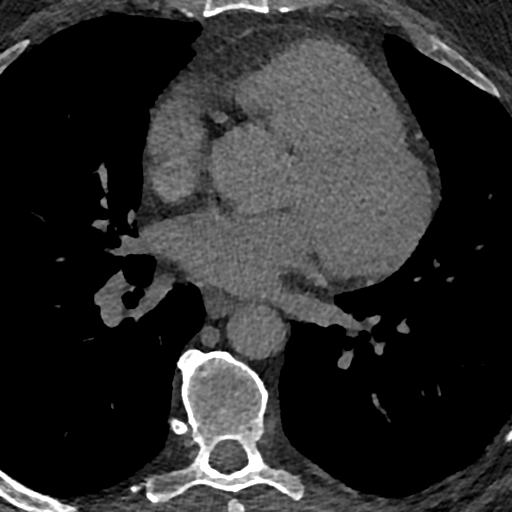
[im 2436/3410  vessel]
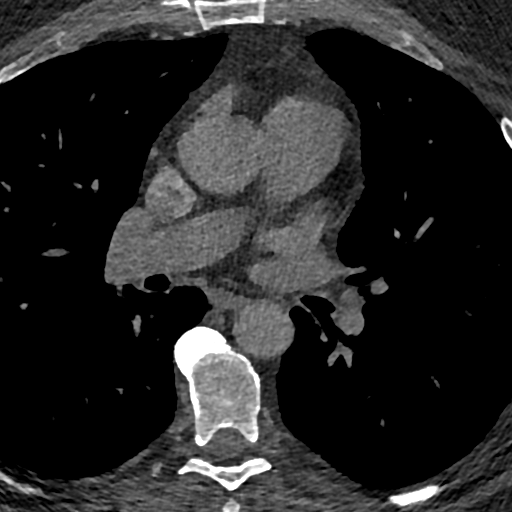
[im 2436/3410  lung]
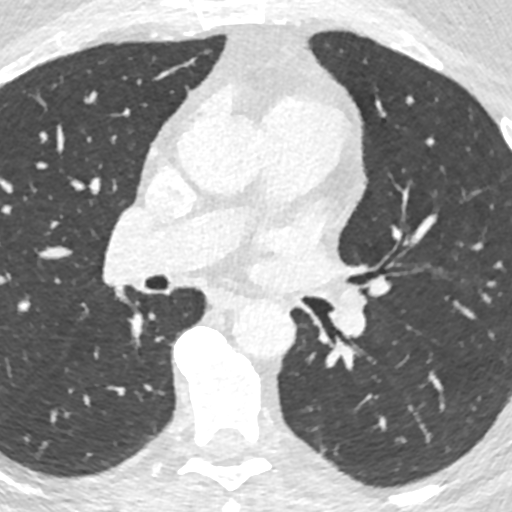
[im 2923/3410  vessel]
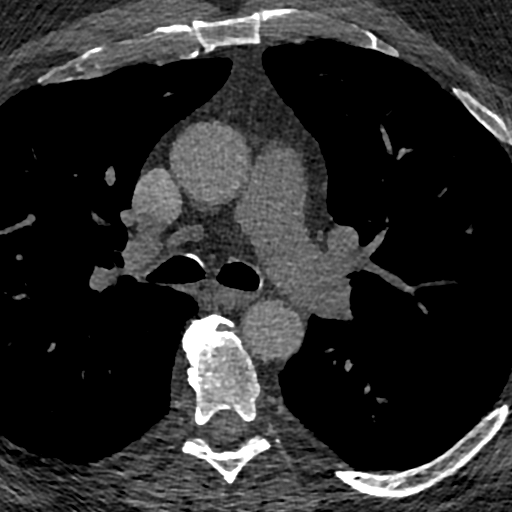

[Series 10: 5-95% · axial · 0.43mm/px · z∈[-349,-243]mm · 6 of 2970 slices shown]
[im 425/2970  vessel]
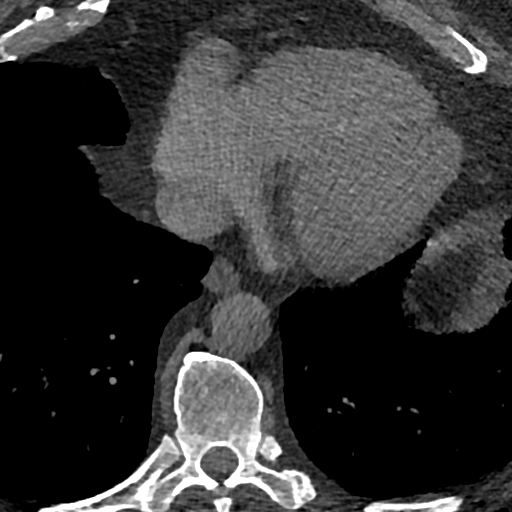
[im 849/2970  vessel]
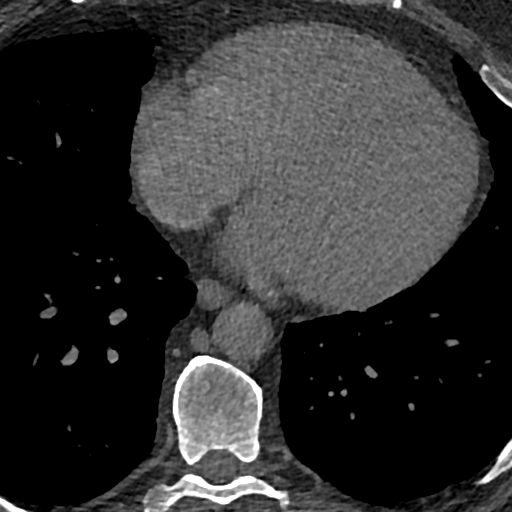
[im 1273/2970  vessel]
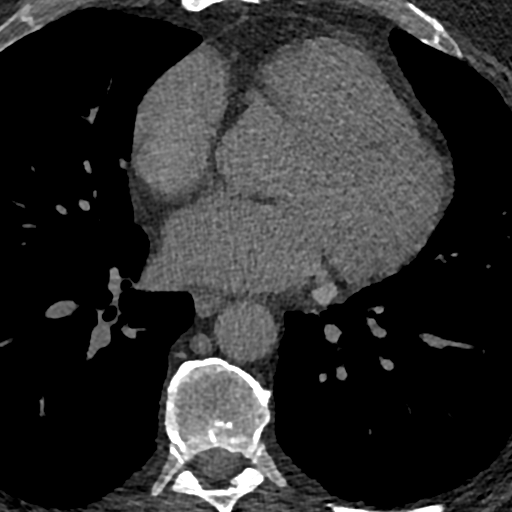
[im 1697/2970  vessel]
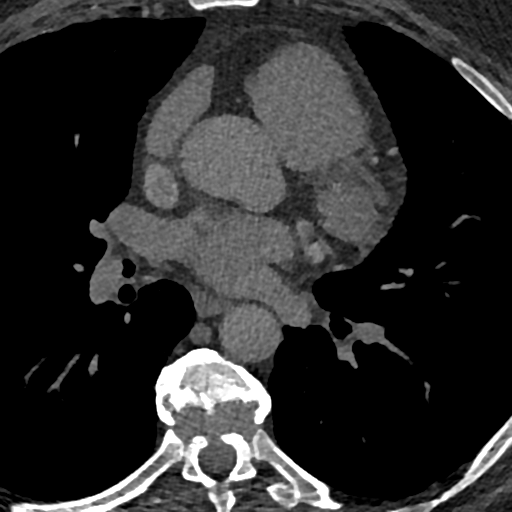
[im 2121/2970  vessel]
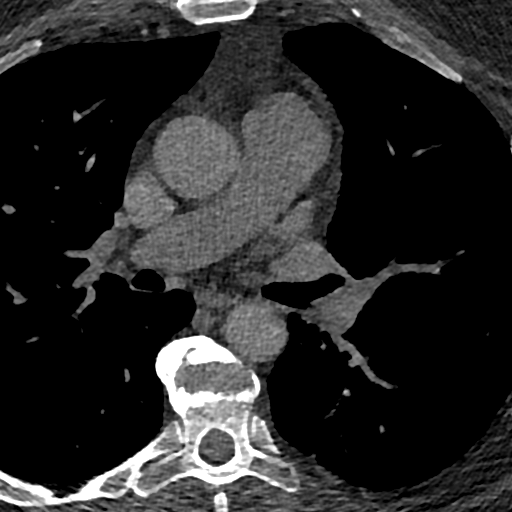
[im 2545/2970  vessel]
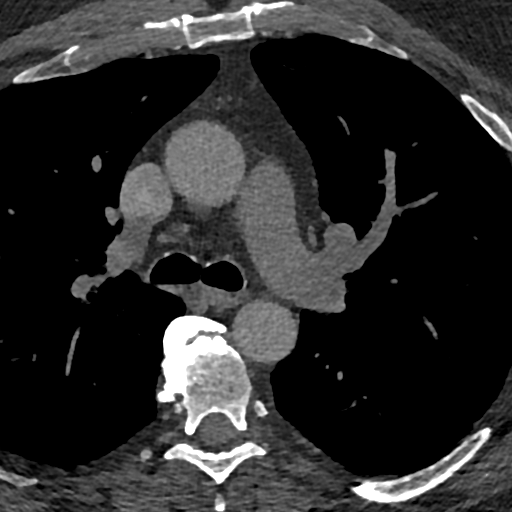

[12 of 20 positions shown; findings below may reference images not displayed]

FINDINGS: Image quality: Average.

Noise artifact is: severe.

Pulmonary Veins: There is normal pulmonary vein drainage into the
left atrium (2 on the right and 2 on the left) with ostial
measurements as follows:

RUPV: Ostium 22.7mm x 21.2mm  area 5.41cm7

RLPV:  Ostium 22.6mm x 19.3mm  area 4.42cm2

LUPV:  Ostium 35.1mm x 23.1mm area 6.86cm7

LLPV:  Ostium 16.9mm x 15.6mm  area 6.40cm8

Left Atrium: The left atrial size is normal. There is no obvious
PFO/ASD but images are Fair due to noise artifact which reduces
sensitivity. The left atrial appendage is large broccoli type with
two lobes. There is no obvious thrombus in the left atrial appendage
on contrast or delayed imaging but significant noise artifact limits
accuracy. The esophagus runs in the left atrial midline and is not
in proximity to any of the pulmonary vein ostia.

Coronary Arteries: CAC score of 0, which is 0 percentile for age-,
race-, and sex-matched controls. Normal coronary origin. Right
dominance. The study was performed without use of NTG and is
insufficient for plaque evaluation.

Right Atrium: Right atrial size is within normal limits.

Right Ventricle: The right ventricular cavity is within normal
limits.

Left Ventricle: The ventricular cavity size is within normal limits.
There are no stigmata of prior infarction. There is no abnormal
filling defect.

Pericardium: Normal thickness with no significant effusion or
calcium present.

Pulmonary Artery: Normal caliber without proximal filling defect.

Cardiac valves: The aortic valve is trileaflet without significant
calcification. The mitral valve is normal structure without
significant calcification.

Aorta: Normal caliber with no significant disease.

Extra-cardiac findings: See attached radiology report for
non-cardiac structures.
IMPRESSION: 1. There is normal pulmonary vein drainage into the left atrium with
ostial measurements above.

2. There is no thrombus in the left atrial appendage but significant
noise artifact limits sensitivity of this study for identifying
thrombus. Consider GM for further evaulation.

3. The esophagus runs in the left atrial midline and is not in
proximity to any of the pulmonary vein ostia.

4. No obvious PFO/ASD but significant noise artifact limits
sensitivity of study.

5. Normal coronary origin. Right dominance.

6. CAC score of 0 which is 0 percentile for age-, race-, and
sex-matched controls.

EXAM:
OVER-READ INTERPRETATION  CT CHEST

The following report is an over-read performed by radiologist Dr.
over-read does not include interpretation of cardiac or coronary
anatomy or pathology. The CTA interpretation by the cardiologist is
attached.
FINDINGS: Limited view of the lung parenchyma demonstrates no suspicious
nodularity. Airways are normal.

Limited view of the mediastinum demonstrates no adenopathy.
Esophagus normal.

Limited view of the upper abdomen unremarkable.

Limited view of the skeleton and chest wall is unremarkable.
IMPRESSION: No significant extracardiac findings.

*** End of Addendum ***
EXAM:
Cardiac CTA
FINDINGS: Image quality: Average.

Noise artifact is: severe.

Pulmonary Veins: There is normal pulmonary vein drainage into the
left atrium (2 on the right and 2 on the left) with ostial
measurements as follows:

RUPV: Ostium 22.7mm x 21.2mm  area 5.41cm7

RLPV:  Ostium 22.6mm x 19.3mm  area 4.42cm2

LUPV:  Ostium 35.1mm x 23.1mm area 6.86cm7

LLPV:  Ostium 16.9mm x 15.6mm  area 6.40cm8

Left Atrium: The left atrial size is normal. There is no obvious
PFO/ASD but images are Fair due to noise artifact which reduces
sensitivity. The left atrial appendage is large broccoli type with
two lobes. There is no obvious thrombus in the left atrial appendage
on contrast or delayed imaging but significant noise artifact limits
accuracy. The esophagus runs in the left atrial midline and is not
in proximity to any of the pulmonary vein ostia.

Coronary Arteries: CAC score of 0, which is 0 percentile for age-,
race-, and sex-matched controls. Normal coronary origin. Right
dominance. The study was performed without use of NTG and is
insufficient for plaque evaluation.

Right Atrium: Right atrial size is within normal limits.

Right Ventricle: The right ventricular cavity is within normal
limits.

Left Ventricle: The ventricular cavity size is within normal limits.
There are no stigmata of prior infarction. There is no abnormal
filling defect.

Pericardium: Normal thickness with no significant effusion or
calcium present.

Pulmonary Artery: Normal caliber without proximal filling defect.

Cardiac valves: The aortic valve is trileaflet without significant
calcification. The mitral valve is normal structure without
significant calcification.

Aorta: Normal caliber with no significant disease.

Extra-cardiac findings: See attached radiology report for
non-cardiac structures.
IMPRESSION: 1. There is normal pulmonary vein drainage into the left atrium with
ostial measurements above.

2. There is no thrombus in the left atrial appendage but significant
noise artifact limits sensitivity of this study for identifying
thrombus. Consider GM for further evaulation.

3. The esophagus runs in the left atrial midline and is not in
proximity to any of the pulmonary vein ostia.

4. No obvious PFO/ASD but significant noise artifact limits
sensitivity of study.

5. Normal coronary origin. Right dominance.

6. CAC score of 0 which is 0 percentile for age-, race-, and
sex-matched controls.

## 2022-04-01 ENCOUNTER — Other Ambulatory Visit (INDEPENDENT_AMBULATORY_CARE_PROVIDER_SITE_OTHER): Payer: Self-pay | Admitting: Family Medicine

## 2022-04-01 ENCOUNTER — Ambulatory Visit (INDEPENDENT_AMBULATORY_CARE_PROVIDER_SITE_OTHER): Payer: BC Managed Care – PPO | Admitting: Family Medicine

## 2022-04-01 ENCOUNTER — Encounter (INDEPENDENT_AMBULATORY_CARE_PROVIDER_SITE_OTHER): Payer: Self-pay | Admitting: Family Medicine

## 2022-04-01 VITALS — BP 112/72 | HR 66 | Temp 98.1°F | Ht 71.0 in | Wt 299.0 lb

## 2022-04-01 DIAGNOSIS — E669 Obesity, unspecified: Secondary | ICD-10-CM | POA: Diagnosis not present

## 2022-04-01 DIAGNOSIS — Z9189 Other specified personal risk factors, not elsewhere classified: Secondary | ICD-10-CM

## 2022-04-01 DIAGNOSIS — R7303 Prediabetes: Secondary | ICD-10-CM | POA: Diagnosis not present

## 2022-04-01 DIAGNOSIS — I10 Essential (primary) hypertension: Secondary | ICD-10-CM

## 2022-04-01 DIAGNOSIS — Z6841 Body Mass Index (BMI) 40.0 and over, adult: Secondary | ICD-10-CM

## 2022-04-01 DIAGNOSIS — R632 Polyphagia: Secondary | ICD-10-CM

## 2022-04-01 DIAGNOSIS — E559 Vitamin D deficiency, unspecified: Secondary | ICD-10-CM | POA: Diagnosis not present

## 2022-04-01 MED ORDER — VITAMIN D (ERGOCALCIFEROL) 1.25 MG (50000 UNIT) PO CAPS: 50000.0000 [IU] | ORAL_CAPSULE | ORAL | 0 refills | Status: DC

## 2022-04-01 MED ORDER — METFORMIN HCL 500 MG PO TABS: ORAL_TABLET | ORAL | 0 refills | Status: DC

## 2022-04-01 MED ORDER — SAXENDA 18 MG/3ML ~~LOC~~ SOPN: 3.0000 mg | PEN_INJECTOR | Freq: Every day | SUBCUTANEOUS | 0 refills | Status: DC

## 2022-04-01 MED ORDER — LOSARTAN POTASSIUM 50 MG PO TABS: 50.0000 mg | ORAL_TABLET | Freq: Every day | ORAL | 0 refills | Status: DC

## 2022-04-15 NOTE — Progress Notes (Signed)
Chief Complaint:   OBESITY Bryan [name] is here to discuss his progress with his obesity treatment plan along with follow-up of his obesity related diagnoses. Bryan [name] is on the Category 3 Plan and states he is following his eating plan approximately 60% of the time. Bryan [name] states he is walking on the treadmill for 30 minutes 4 times per week.  Today's visit was #: 67 Starting weight: 326 lbs Starting date: 09/08/2017 Today's weight: 299 lbs Today's date: 04/01/2022 Total lbs lost to date: 27 Total lbs lost since last in-office visit: 0  Interim History: Bryan [name] continues to do well with maintaining his weight loss. He will be traveling more in the next month. Meal prepping has been somewhat difficult.  Subjective:   1. Essential hypertension Bryan [name]'s blood pressure is controlled with diet and medications. He denies chest pain.  2. Pre-diabetes Bryan [name] is stable on metformin and GLP-1, with no side effects noted.  3. Vitamin D deficiency Bryan [name] continues to work on diet and weight loss.  4. At risk for impaired metabolic function Bryan [name] is at increased risk for impaired metabolic function if calories/protein decreases.   Assessment/Plan:   1. Essential hypertension We will refill losartan for 1 month. Bryan [name] will continue diet and exercise to improve blood pressure control.   - losartan (COZAAR) 50 MG tablet; Take 1 tablet (50 mg total) by mouth daily.  Dispense: 30 tablet; Refill: 0  2. Pre-diabetes We will refill metformin for 1 month. Bryan [name] will continue to work on weight loss, exercise, and decreasing simple carbohydrates to help decrease the risk of diabetes.   - metFORMIN (GLUCOPHAGE) 500 MG tablet; TAKE 1 TABLET(500 MG) BY MOUTH THREE TIMES DAILY  Dispense: 90 tablet; Refill: 0  3. Vitamin D deficiency We will refill prescription Vitamin D for 1 month. Bryan [name] will follow-up for routine testing of Vitamin D, at least 2-3 times per year to avoid  over-replacement.  - Vitamin D, Ergocalciferol, (DRISDOL) 1.25 MG (50000 UNIT) CAPS capsule; Take 1 capsule (50,000 Units total) by mouth every Sunday.  Dispense: 4 capsule; Refill: 0  4. At risk for impaired metabolic function Bryan [name] was given approximately 15 minutes of impaired  metabolic function prevention counseling today. We discussed intensive lifestyle modifications today with Bryan [name] emphasis on specific nutrition and exercise instructions and strategies.   Repetitive spaced learning was employed today to elicit superior memory formation and behavioral change.  5. Obesity with current BMI of 41.8 Bryan [name] is currently in the action stage of change. As such, his goal is to continue with weight loss efforts. He has agreed to the Category 3 Plan.   Protein rich breakfast ideas were discussed.  We discussed various medication options to help Bryan [name] with his weight loss efforts and we both agreed to continue Saxenda, and we will refill for 1 month.  - Liraglutide -Weight Management (SAXENDA) 18 MG/3ML SOPN; Inject 3 mg into the skin daily.  Dispense: 15 mL; Refill: 0  Exercise goals: As is.  Behavioral modification strategies: increasing lean protein intake.  Bryan [name] has agreed to follow-up with our clinic in 4 weeks. He was informed of the importance of frequent follow-up visits to maximize his success with intensive lifestyle modifications for his multiple health conditions.   Objective:   Blood pressure 112/72, pulse 66, temperature 98.1 F (36.7 C), height '5\' 11"'$  (1.803 m), weight 299 lb (135.6 kg), SpO2 97 %. Body mass index is 41.7 kg/m.  General: Cooperative, alert, well developed, in no acute distress. HEENT: Conjunctivae  and lids unremarkable. Cardiovascular: Regular rhythm.  Lungs: Normal work of breathing. Neurologic: No focal deficits.   Lab Results  Component Value Date   CREATININE 0.92 12/31/2021   BUN 15 12/31/2021   NA 139 12/31/2021   K 3.9 12/31/2021    CL 100 12/31/2021   CO2 28 12/31/2021   Lab Results  Component Value Date   ALT 21 09/15/2021   AST 13 09/15/2021   ALKPHOS 58 03/05/2021   BILITOT 0.2 03/05/2021   Lab Results  Component Value Date   HGBA1C 5.8 09/15/2021   HGBA1C 6.2 (H) 02/10/2021   HGBA1C 6.3 (H) 09/11/2020   HGBA1C 6.3 (H) 05/29/2020   HGBA1C 6.4 (H) 01/10/2020   Lab Results  Component Value Date   INSULIN 22.6 02/10/2021   INSULIN 20.9 05/29/2020   INSULIN 24.0 01/10/2020   INSULIN 21.9 11/17/2018   INSULIN 24.0 08/18/2018   Lab Results  Component Value Date   TSH 1.600 05/25/2021   Lab Results  Component Value Date   CHOL 157 02/10/2021   HDL 41 02/10/2021   LDLCALC 79 02/10/2021   LDLDIRECT 136.0 11/16/2016   TRIG 222 (H) 02/10/2021   CHOLHDL 4 07/07/2019   Lab Results  Component Value Date   VD25OH 55.45 09/15/2021   VD25OH 47.1 02/10/2021   VD25OH 57.5 09/18/2020   Lab Results  Component Value Date   WBC 7.9 12/31/2021   HGB 15.4 12/31/2021   HCT 44.8 12/31/2021   MCV 84 12/31/2021   PLT 301 12/31/2021   No results found for: IRON, TIBC, FERRITIN  Attestation Statements:   Reviewed by clinician on day of visit: allergies, medications, problem list, medical history, surgical history, family history, social history, and previous encounter notes.   I, Trixie Dredge, am acting as transcriptionist for Dennard Nip, MD.  I have reviewed the above documentation for accuracy and completeness, and I agree with the above. -  Dennard Nip, MD

## 2022-04-23 ENCOUNTER — Encounter (INDEPENDENT_AMBULATORY_CARE_PROVIDER_SITE_OTHER): Payer: Self-pay | Admitting: Family Medicine

## 2022-04-23 ENCOUNTER — Ambulatory Visit (INDEPENDENT_AMBULATORY_CARE_PROVIDER_SITE_OTHER): Payer: BC Managed Care – PPO | Admitting: Family Medicine

## 2022-04-23 VITALS — BP 133/84 | HR 69 | Temp 97.5°F | Ht 71.0 in | Wt 308.0 lb

## 2022-04-23 DIAGNOSIS — E559 Vitamin D deficiency, unspecified: Secondary | ICD-10-CM | POA: Diagnosis not present

## 2022-04-23 DIAGNOSIS — F3289 Other specified depressive episodes: Secondary | ICD-10-CM | POA: Diagnosis not present

## 2022-04-23 DIAGNOSIS — R632 Polyphagia: Secondary | ICD-10-CM | POA: Diagnosis not present

## 2022-04-23 DIAGNOSIS — E669 Obesity, unspecified: Secondary | ICD-10-CM | POA: Diagnosis not present

## 2022-04-23 DIAGNOSIS — Z6841 Body Mass Index (BMI) 40.0 and over, adult: Secondary | ICD-10-CM

## 2022-04-23 DIAGNOSIS — Z9189 Other specified personal risk factors, not elsewhere classified: Secondary | ICD-10-CM

## 2022-04-23 MED ORDER — VITAMIN D (ERGOCALCIFEROL) 1.25 MG (50000 UNIT) PO CAPS: 50000.0000 [IU] | ORAL_CAPSULE | ORAL | 0 refills | Status: DC

## 2022-04-23 MED ORDER — BUPROPION HCL ER (SR) 150 MG PO TB12: ORAL_TABLET | ORAL | 0 refills | Status: DC

## 2022-04-23 MED ORDER — BD PEN NEEDLE NANO U/F 32G X 4 MM MISC: 1.0000 | Freq: Every day | 0 refills | Status: DC

## 2022-04-27 ENCOUNTER — Other Ambulatory Visit (INDEPENDENT_AMBULATORY_CARE_PROVIDER_SITE_OTHER): Payer: Self-pay | Admitting: Family Medicine

## 2022-04-27 DIAGNOSIS — I10 Essential (primary) hypertension: Secondary | ICD-10-CM

## 2022-05-02 ENCOUNTER — Other Ambulatory Visit (HOSPITAL_COMMUNITY): Payer: Self-pay | Admitting: Physician Assistant

## 2022-05-04 NOTE — Progress Notes (Unsigned)
Chief Complaint:   OBESITY Bryan [name] is here to discuss his progress with his obesity treatment plan along with follow-up of his obesity related diagnoses. [name] is on the Category 3 Plan and states he is following his eating plan approximately 50% of the time. [name] states he is walking 30 minutes 4 times per week.  Today's visit was #: 47 Starting weight: 326 lbs Starting date: 09/08/2017 Today's weight: 308 lbs Today's date: 04/23/2022 Total lbs lost to date: 18 lbs Total lbs lost since last in-office visit: 0 lbs  Interim History: [name]'s daughter graduated college recently and was at [employer] for 5 days or so. He had done more eating out and "off the plan". Also, he has been doing less exercising lately. Ah states he is struggling more with emotional eating after dinner and snacking.  Subjective:   1. Polyphagia [name] is currently on 2.5 mg of Saxenda for the last 4-6 weeks and still with some hunger/cravings at night and after dinner.  2. Other depression with emotional eating [name] reports that he has had an increase in emotional eating.  3. Vitamin D deficiency He is currently taking prescription vitamin D 50,000 IU each week. He denies nausea, vomiting or muscle weakness.  4. At risk for side effect of medication [name] is at risk of side effects from increasing dosage of Saxenda.  Assessment/Plan:  No orders of the defined types were placed in this encounter.   Medications Discontinued During This Encounter  Medication Reason   buPROPion (WELLBUTRIN SR) 150 MG 12 hr tablet Reorder   Vitamin D, Ergocalciferol, (DRISDOL) 1.25 MG (50000 UNIT) CAPS capsule Reorder     Meds ordered this encounter  Medications   buPROPion (WELLBUTRIN SR) 150 MG 12 hr tablet    Sig: 1 po BID    Dispense:  60 tablet    Refill:  0    30 d supply;  ** OV for RF **   Do not send RF request   Vitamin D, Ergocalciferol, (DRISDOL) 1.25 MG (50000 UNIT) CAPS capsule     Sig: Take 1 capsule (50,000 Units total) by mouth every Sunday.    Dispense:  4 capsule    Refill:  0    30 d supply;  ** OV for RF **   Do not send RF request   Insulin Pen Needle (BD PEN NEEDLE NANO U/F) 32G X 4 MM MISC    Sig: 1 each by Does not apply route daily. Use one pen needle once daily to inject Saxenda.    Dispense:  100 each    Refill:  0     1. Polyphagia Will refill Saxenda with an increase to 3 mg daily. (No refills needed) and refill needles. [name] will increase his protein and decrease his carbs.  2. Other depression with emotional eating Will increase Wellbutrin from 150 mg daily to twice daily. [name] was advised not to take second dose any later then lunch time.  - buPROPion (WELLBUTRIN SR) 150 MG 12 hr tablet; 1 po BID  Dispense: 60 tablet; Refill: 0  3. Vitamin D deficiency Low Vitamin D level contributes to fatigue and are associated with obesity, breast, and colon cancer. He agrees to continue to take prescription Vitamin D '@50'$ ,000 IU every week and will follow-up for routine testing of Vitamin D, at least 2-3 times per year to avoid over-replacement.  - Vitamin D, Ergocalciferol, (DRISDOL) 1.25 MG (50000 UNIT) CAPS capsule; Take 1 capsule (50,000 Units total)  by mouth every Sunday.  Dispense: 4 capsule; Refill: 0  4. At risk for side effect of medication [name] was given approximately 9 minutes of drug side effect counseling today.  We discussed side effect possibility and risk versus benefits. [name] agreed to the medication and will contact this office if these side effects are intolerable.  Repetitive spaced learning was employed today to elicit superior memory formation and behavioral change.  5. Obesity with current BMI 37 [name] is currently in the action stage of change. As such, his goal is to continue with weight loss efforts. He has agreed to keeping a food journal and adhering to recommended goals of 2100-2200 calories and 150+ grams of   protein and Category 3 Plan daily.  - Insulin Pen Needle (BD PEN NEEDLE NANO U/F) 32G X 4 MM MISC; 1 each by Does not apply route daily. Use one pen needle once daily to inject Saxenda.  Dispense: 100 each; Refill: 0  Exercise goals: 150 minutes of exercise weekly.  Behavioral modification strategies: increasing lean protein intake, decreasing simple carbohydrates, and avoiding temptations.  [name] has agreed to follow-up with our clinic in 4 weeks. He was informed of the importance of frequent follow-up visits to maximize his success with intensive lifestyle modifications for his multiple health conditions.   Objective:   Blood pressure 133/84, pulse 69, temperature (!) 97.5 F (36.4 C), height '5\' 11"'$  (1.803 m), weight (!) 308 lb (139.7 kg), SpO2 97 %. Body mass index is 42.96 kg/m.  General: Cooperative, alert, well developed, in no acute distress. HEENT: Conjunctivae and lids unremarkable. Cardiovascular: Regular rhythm.  Lungs: Normal work of breathing. Neurologic: No focal deficits.   Lab Results  Component Value Date   CREATININE 0.92 12/31/2021   BUN 15 12/31/2021   NA 139 12/31/2021   K 3.9 12/31/2021   CL 100 12/31/2021   CO2 28 12/31/2021   Lab Results  Component Value Date   ALT 21 09/15/2021   AST 13 09/15/2021   ALKPHOS 58 03/05/2021   BILITOT 0.2 03/05/2021   Lab Results  Component Value Date   HGBA1C 5.8 09/15/2021   HGBA1C 6.2 (H) 02/10/2021   HGBA1C 6.3 (H) 09/11/2020   HGBA1C 6.3 (H) 05/29/2020   HGBA1C 6.4 (H) 01/10/2020   Lab Results  Component Value Date   INSULIN 22.6 02/10/2021   INSULIN 20.9 05/29/2020   INSULIN 24.0 01/10/2020   INSULIN 21.9 11/17/2018   INSULIN 24.0 08/18/2018   Lab Results  Component Value Date   TSH 1.600 05/25/2021   Lab Results  Component Value Date   CHOL 157 02/10/2021   HDL 41 02/10/2021   LDLCALC 79 02/10/2021   LDLDIRECT 136.0 11/16/2016   TRIG 222 (H) 02/10/2021   CHOLHDL 4 07/07/2019   Lab  Results  Component Value Date   VD25OH 55.45 09/15/2021   VD25OH 47.1 02/10/2021   VD25OH 57.5 09/18/2020   Lab Results  Component Value Date   WBC 7.9 12/31/2021   HGB 15.4 12/31/2021   HCT 44.8 12/31/2021   MCV 84 12/31/2021   PLT 301 12/31/2021   No results found for: IRON, TIBC, FERRITIN  Attestation Statements:   Reviewed by clinician on day of visit: allergies, medications, problem list, medical history, surgical history, family history, social history, and previous encounter notes.  ILennette Bihari, CMA, am acting as transcriptionist for Dr. Raliegh Scarlet, DO.  I have reviewed the above documentation for accuracy and completeness, and I agree with the above. -  Marjory Sneddon, D.O.  The [place] was signed into law in 2016 which includes the topic of electronic health records.  This provides immediate access to information in MyChart.  This includes consultation notes, operative notes, office notes, lab results and pathology reports.  If you have any questions about what you read please let us know at your next visit so we can discuss your concerns and take corrective action if need be.  We are right here with you.

## 2022-05-07 ENCOUNTER — Other Ambulatory Visit (INDEPENDENT_AMBULATORY_CARE_PROVIDER_SITE_OTHER): Payer: Self-pay | Admitting: Family Medicine

## 2022-05-07 DIAGNOSIS — R7303 Prediabetes: Secondary | ICD-10-CM

## 2022-05-14 ENCOUNTER — Encounter (INDEPENDENT_AMBULATORY_CARE_PROVIDER_SITE_OTHER): Payer: Self-pay | Admitting: Family Medicine

## 2022-05-14 ENCOUNTER — Ambulatory Visit (INDEPENDENT_AMBULATORY_CARE_PROVIDER_SITE_OTHER): Payer: BC Managed Care – PPO | Admitting: Family Medicine

## 2022-05-14 VITALS — BP 123/71 | HR 72 | Temp 98.1°F | Ht 71.0 in | Wt 306.0 lb

## 2022-05-14 DIAGNOSIS — E559 Vitamin D deficiency, unspecified: Secondary | ICD-10-CM | POA: Diagnosis not present

## 2022-05-14 DIAGNOSIS — E669 Obesity, unspecified: Secondary | ICD-10-CM

## 2022-05-14 DIAGNOSIS — Z6841 Body Mass Index (BMI) 40.0 and over, adult: Secondary | ICD-10-CM | POA: Diagnosis not present

## 2022-05-14 DIAGNOSIS — F3289 Other specified depressive episodes: Secondary | ICD-10-CM | POA: Diagnosis not present

## 2022-05-14 DIAGNOSIS — Z7985 Long-term (current) use of injectable non-insulin antidiabetic drugs: Secondary | ICD-10-CM

## 2022-05-14 MED ORDER — BUPROPION HCL ER (SR) 150 MG PO TB12: ORAL_TABLET | ORAL | 0 refills | Status: DC

## 2022-05-14 MED ORDER — VITAMIN D (ERGOCALCIFEROL) 1.25 MG (50000 UNIT) PO CAPS: 50000.0000 [IU] | ORAL_CAPSULE | ORAL | 0 refills | Status: DC

## 2022-05-14 MED ORDER — SAXENDA 18 MG/3ML ~~LOC~~ SOPN: 3.0000 mg | PEN_INJECTOR | Freq: Every day | SUBCUTANEOUS | 0 refills | Status: DC

## 2022-05-23 DIAGNOSIS — F32A Depression, unspecified: Secondary | ICD-10-CM | POA: Insufficient documentation

## 2022-05-23 DIAGNOSIS — F321 Major depressive disorder, single episode, moderate: Secondary | ICD-10-CM | POA: Insufficient documentation

## 2022-05-23 NOTE — Progress Notes (Unsigned)
Chief Complaint:   OBESITY Bryan [name] is here to discuss his progress with his obesity treatment plan along with follow-up of his obesity related diagnoses. Bryan [name] is on the Category 3 Plan and keeping a food journal and adhering to recommended goals of 2100-2200 calories and 180 protein and states he is following his eating plan approximately 65% of the time. Bryan [name] states he is walking and using his treadmill 30 minutes 4 times per week.  Today's visit was #: 62 Starting weight: 326 lbs Starting date: 09/08/2017 Today's weight: 306 lbs Today's date: 05/14/2022 Total lbs lost to date: 20 lbs Total lbs lost since last in-office visit: 2 lbs  Interim History: Bryan [name] has increased his Saxenda to 3 mg, he has tolerated it well.  Bryan [name] denies any side effects, it is helping with hunger and his cravings.   Subjective:   1. Vitamin D deficiency He is currently taking prescription vitamin D 50,000 IU each week. He denies nausea, vomiting or muscle weakness.  2. Other depression with emotional eating We increased Bryan [name]'s Wellbutrin to twice daily last office visit.  Bryan [name] has noticed his cravings have decreased some.  Assessment/Plan:  No orders of the defined types were placed in this encounter.   Medications Discontinued During This Encounter  Medication Reason   Liraglutide -Weight Management (SAXENDA) 18 MG/3ML SOPN Reorder   buPROPion (WELLBUTRIN SR) 150 MG 12 hr tablet Reorder   Vitamin D, Ergocalciferol, (DRISDOL) 1.25 MG (50000 UNIT) CAPS capsule Reorder     Meds ordered this encounter  Medications   Liraglutide -Weight Management (SAXENDA) 18 MG/3ML SOPN    Sig: Inject 3 mg into the skin daily.    Dispense:  15 mL    Refill:  0   Vitamin D, Ergocalciferol, (DRISDOL) 1.25 MG (50000 UNIT) CAPS capsule    Sig: Take 1 capsule (50,000 Units total) by mouth every Sunday.    Dispense:  4 capsule    Refill:  0    30 d supply;  ** OV for RF **   Do not send RF  request   buPROPion (WELLBUTRIN SR) 150 MG 12 hr tablet    Sig: 1 po BID    Dispense:  60 tablet    Refill:  0    30 d supply;  ** OV for RF **   Do not send RF request     1. Vitamin D deficiency Low Vitamin D level contributes to fatigue and are associated with obesity, breast, and colon cancer. He agrees to continue to take prescription Vitamin D '@50'$ ,000 IU every week and will follow-up for routine testing of Vitamin D, at least 2-3 times per year to avoid over-replacement.   Refill- Vitamin D, Ergocalciferol, (DRISDOL) 1.25 MG (50000 UNIT) CAPS capsule; Take 1 capsule (50,000 Units total) by mouth every Sunday.  Dispense: 4 capsule; Refill: 0  2. Other depression with emotional eating Behavior modification techniques were discussed today to help Bryan [name] deal with his emotional/non-hunger eating behaviors.  Orders and follow up as documented in patient record.    Refill- buPROPion (WELLBUTRIN SR) 150 MG 12 hr tablet; 1 po BID  Dispense: 60 tablet; Refill: 0  3. Obesity with current BMI of 42.7 Bread alternatives and recipe ideas discussed with Bryan [name].  His goal is to increase to 150 minutes per week on the treadmill.  Refill - Liraglutide -Weight Management (SAXENDA) 18 MG/3ML SOPN; Inject 3 mg into the skin daily.  Dispense: 15 mL; Refill: 0  Bryan [name] is  currently in the action stage of change. As such, his goal is to continue with weight loss efforts. He has agreed to the Category 3 Plan and keeping a food journal and adhering to recommended goals of 2100-2200 calories and 180 protein.  - Exercise goals: For substantial health benefits, adults should do at least 150 minutes (2 hours and 30 minutes) a week of moderate-intensity, or 75 minutes (1 hour and 15 minutes) a week of vigorous-intensity aerobic physical activity, or an equivalent combination of moderate- and vigorous-intensity aerobic activity. Aerobic activity should be performed in episodes of at least 10 minutes, and  preferably, it should be spread throughout the week.  Behavioral modification strategies: increasing lean protein intake, decreasing simple carbohydrates, increasing water intake, and planning for success.  Bryan [name] has agreed to follow-up with our clinic in 3 weeks with Dr Leafy Ro. He was informed of the importance of frequent follow-up visits to maximize his success with intensive lifestyle modifications for his multiple health conditions.   Objective:   Blood pressure 123/71, pulse 72, temperature 98.1 F (36.7 C), height '5\' 11"'$  (1.803 m), weight (!) 306 lb (138.8 kg), SpO2 96 %. Body mass index is 42.68 kg/m.  General: Cooperative, alert, well developed, in no acute distress. HEENT: Conjunctivae and lids unremarkable. Cardiovascular: Regular rhythm.  Lungs: Normal work of breathing. Neurologic: No focal deficits.   Lab Results  Component Value Date   CREATININE 0.92 12/31/2021   BUN 15 12/31/2021   NA 139 12/31/2021   K 3.9 12/31/2021   CL 100 12/31/2021   CO2 28 12/31/2021   Lab Results  Component Value Date   ALT 21 09/15/2021   AST 13 09/15/2021   ALKPHOS 58 03/05/2021   BILITOT 0.2 03/05/2021   Lab Results  Component Value Date   HGBA1C 5.8 09/15/2021   HGBA1C 6.2 (H) 02/10/2021   HGBA1C 6.3 (H) 09/11/2020   HGBA1C 6.3 (H) 05/29/2020   HGBA1C 6.4 (H) 01/10/2020   Lab Results  Component Value Date   INSULIN 22.6 02/10/2021   INSULIN 20.9 05/29/2020   INSULIN 24.0 01/10/2020   INSULIN 21.9 11/17/2018   INSULIN 24.0 08/18/2018   Lab Results  Component Value Date   TSH 1.600 05/25/2021   Lab Results  Component Value Date   CHOL 157 02/10/2021   HDL 41 02/10/2021   LDLCALC 79 02/10/2021   LDLDIRECT 136.0 11/16/2016   TRIG 222 (H) 02/10/2021   CHOLHDL 4 07/07/2019   Lab Results  Component Value Date   VD25OH 55.45 09/15/2021   VD25OH 47.1 02/10/2021   VD25OH 57.5 09/18/2020   Lab Results  Component Value Date   WBC 7.9 12/31/2021   HGB 15.4  12/31/2021   HCT 44.8 12/31/2021   MCV 84 12/31/2021   PLT 301 12/31/2021   No results found for: "IRON", "TIBC", "FERRITIN"  Attestation Statements:   Reviewed by clinician on day of visit: allergies, medications, problem list, medical history, surgical history, family history, social history, and previous encounter notes.  I, Davy Pique RMA, am acting as Location manager for [employer], DO.  I have reviewed the above documentation for accuracy and completeness, and I agree with the above. Marjory Sneddon, D.O.  The [employer] was signed into law in 2016 which includes the topic of electronic health records.  This provides immediate access to information in MyChart.  This includes consultation notes, operative notes, office notes, lab results and pathology reports.  If you have any questions about  what you read please let us know at your next visit so we can discuss your concerns and take corrective action if need be.  We are right here with you.

## 2022-06-04 ENCOUNTER — Ambulatory Visit (INDEPENDENT_AMBULATORY_CARE_PROVIDER_SITE_OTHER): Payer: BC Managed Care – PPO | Admitting: Family Medicine

## 2022-06-04 ENCOUNTER — Other Ambulatory Visit (INDEPENDENT_AMBULATORY_CARE_PROVIDER_SITE_OTHER): Payer: Self-pay | Admitting: Family Medicine

## 2022-06-04 ENCOUNTER — Encounter (INDEPENDENT_AMBULATORY_CARE_PROVIDER_SITE_OTHER): Payer: Self-pay | Admitting: Family Medicine

## 2022-06-04 VITALS — BP 114/75 | HR 70 | Temp 98.0°F | Ht 71.0 in | Wt 305.0 lb

## 2022-06-04 DIAGNOSIS — F3289 Other specified depressive episodes: Secondary | ICD-10-CM | POA: Diagnosis not present

## 2022-06-04 DIAGNOSIS — E669 Obesity, unspecified: Secondary | ICD-10-CM

## 2022-06-04 DIAGNOSIS — I10 Essential (primary) hypertension: Secondary | ICD-10-CM

## 2022-06-04 DIAGNOSIS — Z6841 Body Mass Index (BMI) 40.0 and over, adult: Secondary | ICD-10-CM

## 2022-06-04 MED ORDER — BUPROPION HCL ER (SR) 150 MG PO TB12: ORAL_TABLET | ORAL | 0 refills | Status: DC

## 2022-06-04 MED ORDER — HYDROCHLOROTHIAZIDE 25 MG PO TABS: ORAL_TABLET | ORAL | 0 refills | Status: DC

## 2022-06-04 MED ORDER — SAXENDA 18 MG/3ML ~~LOC~~ SOPN: 3.0000 mg | PEN_INJECTOR | Freq: Every day | SUBCUTANEOUS | 0 refills | Status: DC

## 2022-06-04 MED ORDER — LOSARTAN POTASSIUM 50 MG PO TABS: 50.0000 mg | ORAL_TABLET | Freq: Every day | ORAL | 0 refills | Status: DC

## 2022-06-07 ENCOUNTER — Other Ambulatory Visit (INDEPENDENT_AMBULATORY_CARE_PROVIDER_SITE_OTHER): Payer: Self-pay | Admitting: Family Medicine

## 2022-06-07 DIAGNOSIS — R7303 Prediabetes: Secondary | ICD-10-CM

## 2022-06-08 ENCOUNTER — Other Ambulatory Visit (INDEPENDENT_AMBULATORY_CARE_PROVIDER_SITE_OTHER): Payer: Self-pay | Admitting: Family Medicine

## 2022-06-08 DIAGNOSIS — R7303 Prediabetes: Secondary | ICD-10-CM

## 2022-06-08 MED ORDER — METFORMIN HCL 500 MG PO TABS: ORAL_TABLET | ORAL | 0 refills | Status: DC

## 2022-06-08 NOTE — Telephone Encounter (Signed)
LAST APPOINTMENT DATE: 06/04/22 NEXT APPOINTMENT DATE: 06/30/22   [id number] [address], [address] AT [facility] OF [address] [place] [address] Phone: [phone] Fax: [phone]  [facility] [address], [place] [address] Phone: [phone] Fax: [phone]  Patient is requesting a refill of the following medications: Pending Prescriptions:                       Disp   Refills   metFORMIN (GLUCOPHAGE) 500 MG tablet       90 tab*0       Sig: TAKE 1 TABLET(500 MG) BY MOUTH THREE TIMES DAILY   Date last filled: 04/01/22 Previously prescribed by Dr. Raliegh Scarlet  Lab Results      Component                Value               Date                      HGBA1C                   5.8                 09/15/2021                HGBA1C                   6.2 (H)             02/10/2021                HGBA1C                   6.3 (H)             09/11/2020           Lab Results      Component                Value               Date                      LDLCALC                  79                  02/10/2021                CREATININE               0.92                12/31/2021           Lab Results      Component                Value               Date                      VD25OH                   55.45               09/15/2021  VD25OH                   47.1                02/10/2021                VD25OH                   57.5                09/18/2020            BP Readings from Last 3 Encounters: 06/04/22 : 114/75 05/14/22 : 123/71 04/23/22 : 133/84

## 2022-06-08 NOTE — Progress Notes (Signed)
Chief Complaint:   OBESITY Bryan [name] is here to discuss his progress with his obesity treatment plan along with follow-up of his obesity related diagnoses. Bryan [name] is on the Category 3 Plan and keeping a food journal and adhering to recommended goals of 2100-2200 calories and 180+ protein and states he is following his eating plan approximately 75% of the time. Bryan [name] states he is rowing 20 minutes 7 times per week.  Today's visit was #: 79 Starting weight: 326 lbs Starting date: 09/08/2017 Today's weight: 305 lbs Today's date: 06/04/2022 Total lbs lost to date: 21 lbs Total lbs lost since last in-office visit: 1 lb  Interim History: Bryan [name] is here for a follow up office visit.  We reviewed his meal plan and all questions were answered.  Patient's food recall appears to be accurate and consistent with what is on plan when he is following it.   When eating on plan, his hunger and cravings are well controlled.  Bryan [name] just came back from [place] with rowing machine, he is using it 20 mint 5-7 days per week.  He will be going to [place] soon for his birthday.  He is drinking 100-110 oz of water daily, and doing great.     Subjective:   1. Essential hypertension Review: taking medications as instructed, no medication side effects noted, no chest pain on exertion, no dyspnea on exertion, no swelling of ankles.   2. Other depression with emotional eating Bryan [name] is struggling with emotional eating and using food for comfort to the extent that it is negatively impacting his health. He has been working on behavior modification techniques to help reduce his emotional eating and has been minimally successful. He shows no signs of suicidal or homicidal ideations.  Increased Wellbutrin to BID, now has less cravings and less emotional eating.     Assessment/Plan:  No orders of the defined types were placed in this encounter.   Medications Discontinued During This Encounter   Medication Reason   apixaban (ELIQUIS) 5 MG TABS tablet    hydrochlorothiazide (HYDRODIURIL) 25 MG tablet Reorder   losartan (COZAAR) 50 MG tablet Reorder   Liraglutide -Weight Management (SAXENDA) 18 MG/3ML SOPN Reorder   buPROPion (WELLBUTRIN SR) 150 MG 12 hr tablet Reorder     Meds ordered this encounter  Medications   buPROPion (WELLBUTRIN SR) 150 MG 12 hr tablet    Sig: 1 po BID    Dispense:  60 tablet    Refill:  0    30 d supply;  ** OV for RF **   Do not send RF request   hydrochlorothiazide (HYDRODIURIL) 25 MG tablet    Sig: 1 po qd    Dispense:  30 tablet    Refill:  0   Liraglutide -Weight Management (SAXENDA) 18 MG/3ML SOPN    Sig: Inject 3 mg into the skin daily.    Dispense:  15 mL    Refill:  0   losartan (COZAAR) 50 MG tablet    Sig: Take 1 tablet (50 mg total) by mouth daily.    Dispense:  30 tablet    Refill:  0    30 d supply;  ** OV for RF **   Do not send RF request     1. Essential hypertension Bryan [name] is working on healthy weight loss and exercise to improve blood pressure control. We will watch for signs of hypotension as he continues his lifestyle modifications.  Refill - hydrochlorothiazide (  HYDRODIURIL) 25 MG tablet; 1 po qd  Dispense: 30 tablet; Refill: 0 Refill - losartan (COZAAR) 50 MG tablet; Take 1 tablet (50 mg total) by mouth daily.  Dispense: 30 tablet; Refill: 0  2. Other depression with emotional eating Behavior modification techniques were discussed today to help Bryan [name] deal with his emotional/non-hunger eating behaviors.  Orders and follow up as documented in patient record.   Refill - buPROPion (WELLBUTRIN SR) 150 MG 12 hr tablet; 1 po BID  Dispense: 60 tablet; Refill: 0  3. Obesity with current BMI of 42.6 No issues with constipation and denies any GI side effects.  Refill - Liraglutide -Weight Management (SAXENDA) 18 MG/3ML SOPN; Inject 3 mg into the skin daily.  Dispense: 15 mL; Refill: 0  Bryan [name] is currently in the action  stage of change. As such, his goal is to continue with weight loss efforts. He has agreed to the Category 3 Plan or keeping a food journal and adhering to recommended goals of 2100-2200 calories and 180+ protein.   Exercise goals:  Add strength training at least 2 days per week.   Behavioral modification strategies: increasing lean protein intake, decreasing simple carbohydrates, and planning for success.  Bryan [name] has agreed to follow-up with our clinic in 3 weeks. He was informed of the importance of frequent follow-up visits to maximize his success with intensive lifestyle modifications for his multiple health conditions.   Objective:   Blood pressure 114/75, pulse 70, temperature 98 F (36.7 C), height '5\' 11"'$  (1.803 m), weight (!) 305 lb (138.3 kg), SpO2 98 %. Body mass index is 42.54 kg/m.  General: Cooperative, alert, well developed, in no acute distress. HEENT: Conjunctivae and lids unremarkable. Cardiovascular: Regular rhythm.  Lungs: Normal work of breathing. Neurologic: No focal deficits.   Lab Results  Component Value Date   CREATININE 0.92 12/31/2021   BUN 15 12/31/2021   NA 139 12/31/2021   K 3.9 12/31/2021   CL 100 12/31/2021   CO2 28 12/31/2021   Lab Results  Component Value Date   ALT 21 09/15/2021   AST 13 09/15/2021   ALKPHOS 58 03/05/2021   BILITOT 0.2 03/05/2021   Lab Results  Component Value Date   HGBA1C 5.8 09/15/2021   HGBA1C 6.2 (H) 02/10/2021   HGBA1C 6.3 (H) 09/11/2020   HGBA1C 6.3 (H) 05/29/2020   HGBA1C 6.4 (H) 01/10/2020   Lab Results  Component Value Date   INSULIN 22.6 02/10/2021   INSULIN 20.9 05/29/2020   INSULIN 24.0 01/10/2020   INSULIN 21.9 11/17/2018   INSULIN 24.0 08/18/2018   Lab Results  Component Value Date   TSH 1.600 05/25/2021   Lab Results  Component Value Date   CHOL 157 02/10/2021   HDL 41 02/10/2021   LDLCALC 79 02/10/2021   LDLDIRECT 136.0 11/16/2016   TRIG 222 (H) 02/10/2021   CHOLHDL 4 07/07/2019    Lab Results  Component Value Date   VD25OH 55.45 09/15/2021   VD25OH 47.1 02/10/2021   VD25OH 57.5 09/18/2020   Lab Results  Component Value Date   WBC 7.9 12/31/2021   HGB 15.4 12/31/2021   HCT 44.8 12/31/2021   MCV 84 12/31/2021   PLT 301 12/31/2021   No results found for: "IRON", "TIBC", "FERRITIN"   Attestation Statements:   Reviewed by clinician on day of visit: allergies, medications, problem list, medical history, surgical history, family history, social history, and previous encounter notes.  I, Davy Pique, RMA, am acting as Location manager for [employer], DO.  I have reviewed the above documentation for accuracy and completeness, and I agree with the above. Marjory Sneddon, D.O.  The [place] was signed into law in 2016 which includes the topic of electronic health records.  This provides immediate access to information in MyChart.  This includes consultation notes, operative notes, office notes, lab results and pathology reports.  If you have any questions about what you read please let us know at your next visit so we can discuss your concerns and take corrective action if need be.  We are right here with you.

## 2022-06-25 ENCOUNTER — Ambulatory Visit (INDEPENDENT_AMBULATORY_CARE_PROVIDER_SITE_OTHER): Payer: BC Managed Care – PPO | Admitting: Family Medicine

## 2022-06-25 ENCOUNTER — Other Ambulatory Visit (INDEPENDENT_AMBULATORY_CARE_PROVIDER_SITE_OTHER): Payer: Self-pay | Admitting: Family Medicine

## 2022-06-25 DIAGNOSIS — I10 Essential (primary) hypertension: Secondary | ICD-10-CM

## 2022-06-30 ENCOUNTER — Encounter (INDEPENDENT_AMBULATORY_CARE_PROVIDER_SITE_OTHER): Payer: Self-pay | Admitting: Family Medicine

## 2022-06-30 ENCOUNTER — Ambulatory Visit (INDEPENDENT_AMBULATORY_CARE_PROVIDER_SITE_OTHER): Payer: BC Managed Care – PPO | Admitting: Family Medicine

## 2022-06-30 ENCOUNTER — Other Ambulatory Visit (INDEPENDENT_AMBULATORY_CARE_PROVIDER_SITE_OTHER): Payer: Self-pay | Admitting: Family Medicine

## 2022-06-30 VITALS — BP 131/74 | HR 66 | Temp 98.2°F | Ht 71.0 in | Wt 309.0 lb

## 2022-06-30 DIAGNOSIS — E669 Obesity, unspecified: Secondary | ICD-10-CM | POA: Diagnosis not present

## 2022-06-30 DIAGNOSIS — E559 Vitamin D deficiency, unspecified: Secondary | ICD-10-CM

## 2022-06-30 DIAGNOSIS — I10 Essential (primary) hypertension: Secondary | ICD-10-CM

## 2022-06-30 DIAGNOSIS — Z6841 Body Mass Index (BMI) 40.0 and over, adult: Secondary | ICD-10-CM | POA: Diagnosis not present

## 2022-06-30 MED ORDER — HYDROCHLOROTHIAZIDE 25 MG PO TABS: ORAL_TABLET | ORAL | 0 refills | Status: DC

## 2022-06-30 MED ORDER — LOSARTAN POTASSIUM 50 MG PO TABS: 50.0000 mg | ORAL_TABLET | Freq: Every day | ORAL | 0 refills | Status: DC

## 2022-06-30 MED ORDER — VITAMIN D (ERGOCALCIFEROL) 1.25 MG (50000 UNIT) PO CAPS: 50000.0000 [IU] | ORAL_CAPSULE | ORAL | 0 refills | Status: DC

## 2022-07-07 NOTE — Progress Notes (Unsigned)
Chief Complaint:   OBESITY Bryan [name] is here to discuss his progress with his obesity treatment plan along with follow-up of his obesity related diagnoses. Bryan [name] is on the Category 3 Plan or keeping a food journal and adhering to recommended goals of 2100-2200 calories and 180+ grams of protein daily and states he is following his eating plan approximately 65% of the time. Bryan [name] states he is using the row machine for 30 minutes 5 times per week.  Today's visit was #: 70 Starting weight: 326 lbs Starting date: 09/08/2017 Today's weight: 309 lbs Today's date: 06/30/2022 Total lbs lost to date: 17 Total lbs lost since last in-office visit: 0  Interim History: Bryan [name] has done more traveling, both on vacation and for work.  He tried to increase his protein and be mindful of his eating.  He has increased exercise and it appears his muscle mass has increased.  He is working on getting back on track.  Subjective:   1. Essential hypertension Bryan [name]'s blood pressure is well controlled.  He states his blood pressure increased after traveling when he was retaining some fluid.  It is back to controlled now.  2. Vitamin D deficiency Bryan [name] is stable on vitamin D prescription, and he denies nausea, vomiting, or muscle weakness.  Assessment/Plan:   1. Essential hypertension Bryan [name] will continue his medications, and we will refill hydrochlorothiazide and losartan for 1 month.  We will follow-up on his blood pressure at his next visit.  - hydrochlorothiazide (HYDRODIURIL) 25 MG tablet; 1 po qd  Dispense: 30 tablet; Refill: 0 - losartan (COZAAR) 50 MG tablet; Take 1 tablet (50 mg total) by mouth daily.  Dispense: 30 tablet; Refill: 0  2. Vitamin D deficiency Bryan [name] will continue prescription vitamin D 50,000 units once weekly, and we will refill for 1 month.  - Vitamin D, Ergocalciferol, (DRISDOL) 1.25 MG (50000 UNIT) CAPS capsule; Take 1 capsule (50,000 Units total) by mouth every  Sunday.  Dispense: 4 capsule; Refill: 0  3. Obesity, Current BMI 43.1 Bryan [name] is currently in the action stage of change. As such, his goal is to continue with weight loss efforts. He has agreed to the Category 3 Plan.   Exercise goals: As is.   Behavioral modification strategies: increasing lean protein intake, increasing vegetables, and increasing water intake.  Bryan [name] has agreed to follow-up with our clinic in 3 weeks. He was informed of the importance of frequent follow-up visits to maximize his success with intensive lifestyle modifications for his multiple health conditions.   Objective:   Blood pressure 131/74, pulse 66, temperature 98.2 F (36.8 C), height '5\' 11"'$  (1.803 m), weight (!) 309 lb (140.2 kg), SpO2 97 %. Body mass index is 43.1 kg/m.  General: Cooperative, alert, well developed, in no acute distress. HEENT: Conjunctivae and lids unremarkable. Cardiovascular: Regular rhythm.  Lungs: Normal work of breathing. Neurologic: No focal deficits.   Lab Results  Component Value Date   CREATININE 0.92 12/31/2021   BUN 15 12/31/2021   NA 139 12/31/2021   K 3.9 12/31/2021   CL 100 12/31/2021   CO2 28 12/31/2021   Lab Results  Component Value Date   ALT 21 09/15/2021   AST 13 09/15/2021   ALKPHOS 58 03/05/2021   BILITOT 0.2 03/05/2021   Lab Results  Component Value Date   HGBA1C 5.8 09/15/2021   HGBA1C 6.2 (H) 02/10/2021   HGBA1C 6.3 (H) 09/11/2020   HGBA1C 6.3 (H) 05/29/2020   HGBA1C 6.4 (H) 01/10/2020  Lab Results  Component Value Date   INSULIN 22.6 02/10/2021   INSULIN 20.9 05/29/2020   INSULIN 24.0 01/10/2020   INSULIN 21.9 11/17/2018   INSULIN 24.0 08/18/2018   Lab Results  Component Value Date   TSH 1.600 05/25/2021   Lab Results  Component Value Date   CHOL 157 02/10/2021   HDL 41 02/10/2021   LDLCALC 79 02/10/2021   LDLDIRECT 136.0 11/16/2016   TRIG 222 (H) 02/10/2021   CHOLHDL 4 07/07/2019   Lab Results  Component Value Date    VD25OH 55.45 09/15/2021   VD25OH 47.1 02/10/2021   VD25OH 57.5 09/18/2020   Lab Results  Component Value Date   WBC 7.9 12/31/2021   HGB 15.4 12/31/2021   HCT 44.8 12/31/2021   MCV 84 12/31/2021   PLT 301 12/31/2021   No results found for: "IRON", "TIBC", "FERRITIN"  Attestation Statements:   Reviewed by clinician on day of visit: allergies, medications, problem list, medical history, surgical history, family history, social history, and previous encounter notes.   I, Trixie Dredge, am acting as transcriptionist for Dennard Nip, MD.  I have reviewed the above documentation for accuracy and completeness, and I agree with the above. -  Dennard Nip, MD

## 2022-07-09 ENCOUNTER — Other Ambulatory Visit (HOSPITAL_BASED_OUTPATIENT_CLINIC_OR_DEPARTMENT_OTHER): Payer: Self-pay

## 2022-07-09 ENCOUNTER — Encounter: Payer: Self-pay | Admitting: Internal Medicine

## 2022-07-09 ENCOUNTER — Ambulatory Visit: Payer: BC Managed Care – PPO | Admitting: Internal Medicine

## 2022-07-09 VITALS — BP 124/72 | HR 74 | Temp 98.5°F | Resp 18 | Ht 71.0 in | Wt 315.1 lb

## 2022-07-09 DIAGNOSIS — I4892 Unspecified atrial flutter: Secondary | ICD-10-CM

## 2022-07-09 DIAGNOSIS — J029 Acute pharyngitis, unspecified: Secondary | ICD-10-CM

## 2022-07-09 DIAGNOSIS — J020 Streptococcal pharyngitis: Secondary | ICD-10-CM | POA: Diagnosis not present

## 2022-07-09 LAB — POCT RAPID STREP A (OFFICE): Rapid Strep A Screen: POSITIVE — AB

## 2022-07-09 LAB — POC COVID19 BINAXNOW: SARS Coronavirus 2 Ag: NEGATIVE

## 2022-07-09 MED ORDER — AMOXICILLIN 875 MG PO TABS
875.0000 mg | ORAL_TABLET | Freq: Two times a day (BID) | ORAL | 0 refills | Status: DC
  Filled 2022-07-09: qty 20, 10d supply, fill #0

## 2022-07-09 NOTE — Patient Instructions (Addendum)
Once better recommend to proceed with covid booster (bivalent) at your pharmacy. Flu shot this fall.   Take the antibiotic for 10 days  Use probiotics for 2-3 weeks   Tylenol  Lots of fluids  Call if no gradually better

## 2022-07-09 NOTE — Progress Notes (Signed)
Subjective:    Patient ID: Bryan [name], male    DOB: 1961-10-19, 61 y.o.   MRN: [id number]  DOS:  07/09/2022 Type of visit - description: Acute visit  Symptoms started 2 days ago: Developed sore throat, mild sinus congestion. Denies fever chills No nausea or vomiting. No rash Mild aches and pains. Mild cough.  Review of Systems See above   Past Medical History:  Diagnosis Date   Allergy    Dental crowns present    Hyperlipidemia    Lower back pain    Nephrolithiasis 2017   Prediabetes 09/04/2014   Seasonal allergies    Sebaceous cyst 04/2013   upper back   Sleep apnea    uses CPAP nightly   Urolithiasis 10/2016    Past Surgical History:  Procedure Laterality Date   A-FLUTTER ABLATION N/A 01/19/2022   Procedure: A-FLUTTER ABLATION;  Surgeon: Vickie Epley, MD;  Location: [facility];  Service: Cardiovascular;  Laterality: N/A;   ADENOIDECTOMY     as a child   bone marrow donor     under anesthesia   CHOLECYSTECTOMY  01/12/2012   Procedure: LAPAROSCOPIC CHOLECYSTECTOMY WITH INTRAOPERATIVE CHOLANGIOGRAM;  Surgeon: [name], MD;  Location: [facility];  Service: General;  Laterality: N/A;   COLONOSCOPY     CYST EXCISION     cyst removal from back   EAR CYST EXCISION N/A 05/04/2013   Procedure: Excision subcutaneous mass posterior neck;  Surgeon: [name], MD;  Location: [facility];  Service: General;  Laterality: N/A;   POLYPECTOMY     wisdom teeth      Current Outpatient Medications  Medication Instructions   amoxicillin (AMOXIL) 875 mg, Oral, 2 times daily   atorvastatin (LIPITOR) 20 mg, Oral, Every evening   azelastine (ASTELIN) 0.1 % nasal spray 2 sprays, Each Nare, At bedtime PRN, Use in each nostril as directed    buPROPion (WELLBUTRIN SR) 150 MG 12 hr tablet 1 po BID   CO ENZYME Q-10 PO 1 capsule, Oral, Every morning   diltiazem (CARDIZEM) 30 MG tablet Take 1 - 2 tablets by mouth every 6 hours as needed for heart  rate greater than 130   fish oil-omega-3 fatty acids 1 g, Oral, Every morning   hydrochlorothiazide (HYDRODIURIL) 25 MG tablet 1 po qd   Insulin Pen Needle (BD PEN NEEDLE NANO U/F) 32G X 4 MM MISC 1 each, Does not apply, Daily, Use one pen needle once daily to inject Saxenda.    losartan (COZAAR) 50 mg, Oral, Daily   Melatonin 5 mg, Oral, Daily at bedtime   metFORMIN (GLUCOPHAGE) 500 MG tablet TAKE 1 TABLET(500 MG) BY MOUTH THREE TIMES DAILY   metoprolol succinate (TOPROL-XL) 25 MG 24 hr tablet TAKE 1 TABLET(25 MG) BY MOUTH AT BEDTIME   Multiple Vitamins-Minerals (CENTRUM PO) 1 tablet, Oral, Every morning   Niacin (VITAMIN B-3 PO) 1 tablet, Oral, Every morning   promethazine-codeine (PHENERGAN WITH CODEINE) 6.25-10 MG/5ML syrup 5 mLs, Oral, Every 6 hours PRN   Saxenda 3 mg, Subcutaneous, Daily   Vitamin D (Ergocalciferol) (DRISDOL) 50,000 Units, Oral, Every Sun       Objective:   Physical Exam BP 124/72   Pulse 74   Temp 98.5 F (36.9 C) (Oral)   Resp 18   Ht '5\' 11"'$  (1.803 m)   Wt (!) 315 lb 2 oz (142.9 kg)   SpO2 95%   BMI 43.95 kg/m  General:   Well developed,  NAD, BMI noted. HEENT:  Normocephalic . Face symmetric, atraumatic. Throat: Red, symmetric, + white patches on the right side.  Tonsils not seen (absent or small) Lungs:  CTA B Normal respiratory effort, no intercostal retractions, no accessory muscle use. Heart: RRR,  no murmur.  Lower extremities: no pretibial edema bilaterally  Skin: Not pale. Not jaundice Neurologic:  alert & oriented X3.  Speech normal, gait appropriate for age and unassisted Psych--  Cognition and judgment appear intact.  Cooperative with normal attention span and concentration.  Behavior appropriate. No anxious or depressed appearing.      Assessment     Assessment Prediabetes HTN Hyperlipidemia CV: Atrial flutter, ablation 01-2022 --> NSR.  Eliquis discontinue June 2023. Insomnia: on OTCs Morbid obesity, BMI 46 OSA,  CPAP Seasonal allergies Kidney stone (first) 10-2016 Vit D def dx 09/2017 Retina detachment DX 06-2019  PLAN: Pharyngitis: Symptoms consistent with pharyngitis, strep test positive, COVID test negative. Plan: Amoxicillin for 10 days even if he is feeling better, fluids, Tylenol, call if not gradually better Atrial flutter: Saw cardiology 02-2022, maintaining normal rhythm.  Was told okay to stop anticoagulation by 05/2022 if no recurrence per his Apple Watch.  He was doing well and stop Eliquis as recommended.  He is very diligent about wearing his Apple Watch and he knows is at a his higher risk of a arrhythmia in the future.

## 2022-07-10 NOTE — Assessment & Plan Note (Signed)
Pharyngitis: Symptoms consistent with pharyngitis, strep test positive, COVID test negative. Plan: Amoxicillin for 10 days even if he is feeling better, fluids, Tylenol, call if not gradually better Atrial flutter: Saw cardiology 02-2022, maintaining normal rhythm.  Was told okay to stop anticoagulation by 05/2022 if no recurrence per his Apple Watch.  He was doing well and stop Eliquis as recommended.  He is very diligent about wearing his Apple Watch and he knows is at a his higher risk of a arrhythmia in the future.

## 2022-07-15 ENCOUNTER — Encounter (INDEPENDENT_AMBULATORY_CARE_PROVIDER_SITE_OTHER): Payer: Self-pay

## 2022-07-21 ENCOUNTER — Ambulatory Visit (INDEPENDENT_AMBULATORY_CARE_PROVIDER_SITE_OTHER): Payer: BC Managed Care – PPO | Admitting: Family Medicine

## 2022-07-21 ENCOUNTER — Encounter (INDEPENDENT_AMBULATORY_CARE_PROVIDER_SITE_OTHER): Payer: Self-pay | Admitting: Family Medicine

## 2022-07-21 ENCOUNTER — Other Ambulatory Visit (INDEPENDENT_AMBULATORY_CARE_PROVIDER_SITE_OTHER): Payer: Self-pay | Admitting: Family Medicine

## 2022-07-21 VITALS — BP 128/86 | HR 69 | Temp 98.3°F | Ht 71.0 in | Wt 310.0 lb

## 2022-07-21 DIAGNOSIS — Z6841 Body Mass Index (BMI) 40.0 and over, adult: Secondary | ICD-10-CM

## 2022-07-21 DIAGNOSIS — E7849 Other hyperlipidemia: Secondary | ICD-10-CM | POA: Diagnosis not present

## 2022-07-21 DIAGNOSIS — I10 Essential (primary) hypertension: Secondary | ICD-10-CM | POA: Diagnosis not present

## 2022-07-21 DIAGNOSIS — F3289 Other specified depressive episodes: Secondary | ICD-10-CM

## 2022-07-21 DIAGNOSIS — R7303 Prediabetes: Secondary | ICD-10-CM

## 2022-07-21 DIAGNOSIS — E669 Obesity, unspecified: Secondary | ICD-10-CM

## 2022-07-21 DIAGNOSIS — E559 Vitamin D deficiency, unspecified: Secondary | ICD-10-CM

## 2022-07-21 MED ORDER — BUPROPION HCL ER (SR) 150 MG PO TB12
ORAL_TABLET | ORAL | 0 refills | Status: DC
Start: 2022-07-21 — End: 2022-09-28

## 2022-07-21 MED ORDER — METFORMIN HCL 500 MG PO TABS: ORAL_TABLET | ORAL | 0 refills | Status: DC

## 2022-07-21 MED ORDER — LOSARTAN POTASSIUM 50 MG PO TABS: 50.0000 mg | ORAL_TABLET | Freq: Every day | ORAL | 0 refills | Status: DC

## 2022-07-21 MED ORDER — SAXENDA 18 MG/3ML ~~LOC~~ SOPN: 3.0000 mg | PEN_INJECTOR | Freq: Every day | SUBCUTANEOUS | 0 refills | Status: DC

## 2022-07-21 MED ORDER — VITAMIN D (ERGOCALCIFEROL) 1.25 MG (50000 UNIT) PO CAPS: 50000.0000 [IU] | ORAL_CAPSULE | ORAL | 0 refills | Status: DC

## 2022-07-21 MED ORDER — HYDROCHLOROTHIAZIDE 25 MG PO TABS: ORAL_TABLET | ORAL | 0 refills | Status: DC

## 2022-07-23 ENCOUNTER — Other Ambulatory Visit (INDEPENDENT_AMBULATORY_CARE_PROVIDER_SITE_OTHER): Payer: Self-pay | Admitting: Family Medicine

## 2022-07-23 DIAGNOSIS — I10 Essential (primary) hypertension: Secondary | ICD-10-CM

## 2022-07-25 ENCOUNTER — Other Ambulatory Visit (INDEPENDENT_AMBULATORY_CARE_PROVIDER_SITE_OTHER): Payer: Self-pay | Admitting: Family Medicine

## 2022-07-25 DIAGNOSIS — I10 Essential (primary) hypertension: Secondary | ICD-10-CM

## 2022-07-25 NOTE — Progress Notes (Signed)
Chief Complaint:   OBESITY Bryan [name] is here to discuss his progress with his obesity treatment plan along with follow-up of his obesity related diagnoses. Bryan [name] is on the Category 3 Plan and states he is following his eating plan approximately 75% of the time. Bryan [name] states he is rowing or walking 30 minutes 5 times per week.  Today's visit was #: 56 Starting weight: 326 lbs Starting date: 09/08/2017 Today's weight: 310 lbs Today's date: 07/21/2022 Total lbs lost to date: 16 Total lbs lost since last in-office visit: +1  Interim History: Bryan [name] was sick with strep throat last week and was on an antibiotic. He has been exercising more and also feeling better with his mood. Pt denies hunger or cravings. Pt is overall happy with his weight loss journey. They just went to [place], [place] on vacation and did more off plan eating.  Subjective:   1. Essential hypertension Bryan [name] has been eating out more lately while on vacation and likely had more salt and definitely more alcohol intake.  2. Other depression with emotional eating Pt has had 3 months of Wellbutrin at BID dose. He reports having good control of his mood, hunger, and cravings; sometimes so good that he doesn't feel like eating.  3. Pre-diabetes Bryan [name] is tolerating medication(s) well without side effects.  Medication compliance is good as patient endorses taking it as prescribed.  The patient denies additional concerns regarding this condition.      4. Other hyperlipidemia Bryan [name] has hyperlipidemia and has been trying to improve his cholesterol levels with intensive lifestyle modifications including a low saturated fat diet, exercise, and weight loss. He denies any chest pain, claudication, or myalgias. Medication: Lipitor 20 mg  5. Vitamin D deficiency He is currently taking prescription vitamin D 50,000 IU each week. He denies nausea, vomiting or muscle weakness.  Assessment/Plan:  No orders of the defined types  were placed in this encounter.   Medications Discontinued During This Encounter  Medication Reason   buPROPion (WELLBUTRIN SR) 150 MG 12 hr tablet Reorder   Liraglutide -Weight Management (SAXENDA) 18 MG/3ML SOPN Reorder   metFORMIN (GLUCOPHAGE) 500 MG tablet Reorder   hydrochlorothiazide (HYDRODIURIL) 25 MG tablet Reorder   losartan (COZAAR) 50 MG tablet Reorder   Vitamin D, Ergocalciferol, (DRISDOL) 1.25 MG (50000 UNIT) CAPS capsule Reorder     Meds ordered this encounter  Medications   Vitamin D, Ergocalciferol, (DRISDOL) 1.25 MG (50000 UNIT) CAPS capsule    Sig: Take 1 capsule (50,000 Units total) by mouth every Sunday.    Dispense:  4 capsule    Refill:  0    30 d supply;  ** OV for RF **   Do not send RF request   Liraglutide -Weight Management (SAXENDA) 18 MG/3ML SOPN    Sig: Inject 3 mg into the skin daily.    Dispense:  15 mL    Refill:  0   metFORMIN (GLUCOPHAGE) 500 MG tablet    Sig: TAKE 1 TABLET(500 MG) BY MOUTH THREE TIMES DAILY    Dispense:  90 tablet    Refill:  0   losartan (COZAAR) 50 MG tablet    Sig: Take 1 tablet (50 mg total) by mouth daily.    Dispense:  30 tablet    Refill:  0    30 d supply;  ** OV for RF **   Do not send RF request   hydrochlorothiazide (HYDRODIURIL) 25 MG tablet    Sig: 1 po  qd    Dispense:  30 tablet    Refill:  0   buPROPion (WELLBUTRIN SR) 150 MG 12 hr tablet    Sig: 1 po BID    Dispense:  60 tablet    Refill:  0    30 d supply;  ** OV for RF **   Do not send RF request     1. Essential hypertension BP essentially at goal. Bryan [name] is working on healthy weight loss and exercise to improve blood pressure control. We will watch for signs of hypotension as he continues his lifestyle modifications.  Refill- losartan (COZAAR) 50 MG tablet; Take 1 tablet (50 mg total) by mouth daily.  Dispense: 30 tablet; Refill: 0 Refill- hydrochlorothiazide (HYDRODIURIL) 25 MG tablet; 1 po qd  Dispense: 30 tablet; Refill: 0  2. Other  depression with emotional eating Behavior modification techniques were discussed today to help Bryan [name] deal with his emotional/non-hunger eating behaviors.  Orders and follow up as documented in patient record. Bryan [name] declines change in dose at this time. He knows the importance of eating on plan and not skipping meals.   Refill- buPROPion (WELLBUTRIN SR) 150 MG 12 hr tablet; 1 po BID  Dispense: 60 tablet; Refill: 0  3. Pre-diabetes Bryan [name] will continue to work on weight loss, exercise, and decreasing simple carbohydrates to help decrease the risk of diabetes. Pt is very happy with medication regimen for his hunger and cravings. Check A1c and fasting insulin at next OV.  Refill- metFORMIN (GLUCOPHAGE) 500 MG tablet; TAKE 1 TABLET(500 MG) BY MOUTH THREE TIMES DAILY  Dispense: 90 tablet; Refill: 0  4. Other hyperlipidemia Cardiovascular risk and specific lipid/LDL goals reviewed.  We discussed several lifestyle modifications today and Bryan [name] will continue to work on diet, exercise and weight loss efforts. Orders and follow up as documented in patient record. Medication management per PCP. Continue prudent nutritional plan. Repeat labs at next OV.  Counseling Intensive lifestyle modifications are the first line treatment for this issue. Dietary changes: Increase soluble fiber. Decrease simple carbohydrates. Exercise changes: Moderate to vigorous-intensity aerobic activity 150 minutes per week if tolerated. Lipid-lowering medications: see documented in medical record.  5. Vitamin D deficiency Low Vitamin D level contributes to fatigue and are associated with obesity, breast, and colon cancer. He agrees to continue to take prescription Vitamin D '@50'$ ,000 IU every week and will follow-up for routine testing of Vitamin D, at least 2-3 times per year to avoid over-replacement. Check labs at next OV.  Refill- Vitamin D, Ergocalciferol, (DRISDOL) 1.25 MG (50000 UNIT) CAPS capsule; Take 1 capsule  (50,000 Units total) by mouth every Sunday.  Dispense: 4 capsule; Refill: 0  6. Obesity with current BMI 43.3 Bryan [name] is currently in the action stage of change. As such, his goal is to continue with weight loss efforts. He has agreed to the Category 3 Plan.   We discussed various medication options to help Bryan [name] with his weight loss efforts and we both agreed to continue Saxenda 3 mg. Refill- Liraglutide -Weight Management (SAXENDA) 18 MG/3ML SOPN; Inject 3 mg into the skin daily.  Dispense: 15 mL; Refill: 0  Exercise goals:  As is  Behavioral modification strategies: increasing lean protein intake, decreasing simple carbohydrates, and decreasing eating out.  Bryan [name] has agreed to follow-up with our clinic in 3 weeks for fasting labs and repeat IC. He was informed of the importance of frequent follow-up visits to maximize his success with intensive lifestyle modifications for his multiple health conditions.  Objective:   Blood pressure 128/86, pulse 69, temperature 98.3 F (36.8 C), height '5\' 11"'$  (1.803 m), weight (!) 310 lb (140.6 kg), SpO2 97 %. Body mass index is 43.24 kg/m.  General: Cooperative, alert, well developed, in no acute distress. HEENT: Conjunctivae and lids unremarkable. Cardiovascular: Regular rhythm.  Lungs: Normal work of breathing. Neurologic: No focal deficits.   Lab Results  Component Value Date   CREATININE 0.92 12/31/2021   BUN 15 12/31/2021   NA 139 12/31/2021   K 3.9 12/31/2021   CL 100 12/31/2021   CO2 28 12/31/2021   Lab Results  Component Value Date   ALT 21 09/15/2021   AST 13 09/15/2021   ALKPHOS 58 03/05/2021   BILITOT 0.2 03/05/2021   Lab Results  Component Value Date   HGBA1C 5.8 09/15/2021   HGBA1C 6.2 (H) 02/10/2021   HGBA1C 6.3 (H) 09/11/2020   HGBA1C 6.3 (H) 05/29/2020   HGBA1C 6.4 (H) 01/10/2020   Lab Results  Component Value Date   INSULIN 22.6 02/10/2021   INSULIN 20.9 05/29/2020   INSULIN 24.0 01/10/2020   INSULIN  21.9 11/17/2018   INSULIN 24.0 08/18/2018   Lab Results  Component Value Date   TSH 1.600 05/25/2021   Lab Results  Component Value Date   CHOL 157 02/10/2021   HDL 41 02/10/2021   LDLCALC 79 02/10/2021   LDLDIRECT 136.0 11/16/2016   TRIG 222 (H) 02/10/2021   CHOLHDL 4 07/07/2019   Lab Results  Component Value Date   VD25OH 55.45 09/15/2021   VD25OH 47.1 02/10/2021   VD25OH 57.5 09/18/2020   Lab Results  Component Value Date   WBC 7.9 12/31/2021   HGB 15.4 12/31/2021   HCT 44.8 12/31/2021   MCV 84 12/31/2021   PLT 301 12/31/2021   Attestation Statements:   Reviewed by clinician on day of visit: allergies, medications, problem list, medical history, surgical history, family history, social history, and previous encounter notes.  Time spent on visit including pre-visit chart review and post-visit care and charting was 40 minutes.   I, Kathlene November, BS, CMA, am acting as transcriptionist for [employer], DO.   I have reviewed the above documentation for accuracy and completeness, and I agree with the above. Marjory Sneddon, D.O.  The [place] was signed into law in 2016 which includes the topic of electronic health records.  This provides immediate access to information in MyChart.  This includes consultation notes, operative notes, office notes, lab results and pathology reports.  If you have any questions about what you read please let us know at your next visit so we can discuss your concerns and take corrective action if need be.  We are right here with you.

## 2022-08-05 ENCOUNTER — Encounter: Payer: Self-pay | Admitting: Internal Medicine

## 2022-08-06 ENCOUNTER — Other Ambulatory Visit (INDEPENDENT_AMBULATORY_CARE_PROVIDER_SITE_OTHER): Payer: Self-pay | Admitting: Family Medicine

## 2022-08-06 DIAGNOSIS — E66813 Obesity, class 3: Secondary | ICD-10-CM

## 2022-08-13 ENCOUNTER — Encounter (INDEPENDENT_AMBULATORY_CARE_PROVIDER_SITE_OTHER): Payer: Self-pay | Admitting: Family Medicine

## 2022-08-13 ENCOUNTER — Ambulatory Visit (INDEPENDENT_AMBULATORY_CARE_PROVIDER_SITE_OTHER): Payer: BC Managed Care – PPO | Admitting: Family Medicine

## 2022-08-13 VITALS — BP 128/77 | HR 62 | Temp 98.1°F | Ht 71.0 in | Wt 309.0 lb

## 2022-08-13 DIAGNOSIS — I10 Essential (primary) hypertension: Secondary | ICD-10-CM

## 2022-08-13 DIAGNOSIS — Z6841 Body Mass Index (BMI) 40.0 and over, adult: Secondary | ICD-10-CM

## 2022-08-13 DIAGNOSIS — E559 Vitamin D deficiency, unspecified: Secondary | ICD-10-CM | POA: Diagnosis not present

## 2022-08-13 DIAGNOSIS — R7303 Prediabetes: Secondary | ICD-10-CM | POA: Diagnosis not present

## 2022-08-13 DIAGNOSIS — E669 Obesity, unspecified: Secondary | ICD-10-CM

## 2022-08-13 DIAGNOSIS — R0602 Shortness of breath: Secondary | ICD-10-CM

## 2022-08-13 MED ORDER — SAXENDA 18 MG/3ML ~~LOC~~ SOPN: 3.0000 mg | PEN_INJECTOR | Freq: Every day | SUBCUTANEOUS | 0 refills | Status: DC

## 2022-08-13 MED ORDER — METFORMIN HCL 500 MG PO TABS: ORAL_TABLET | ORAL | 0 refills | Status: DC

## 2022-08-13 MED ORDER — VITAMIN D (ERGOCALCIFEROL) 1.25 MG (50000 UNIT) PO CAPS: 50000.0000 [IU] | ORAL_CAPSULE | ORAL | 0 refills | Status: DC

## 2022-08-14 LAB — COMPREHENSIVE METABOLIC PANEL
ALT: 28 IU/L (ref 0–44)
AST: 20 IU/L (ref 0–40)
Albumin/Globulin Ratio: 1.8 (ref 1.2–2.2)
Albumin: 4.2 g/dL (ref 3.8–4.9)
Alkaline Phosphatase: 75 IU/L (ref 44–121)
BUN/Creatinine Ratio: 20 (ref 9–20)
BUN: 18 mg/dL (ref 6–24)
Bilirubin Total: 0.4 mg/dL (ref 0.0–1.2)
CO2: 20 mmol/L (ref 20–29)
Calcium: 9.2 mg/dL (ref 8.7–10.2)
Chloride: 103 mmol/L (ref 96–106)
Creatinine, Ser: 0.92 mg/dL (ref 0.76–1.27)
Globulin, Total: 2.4 g/dL (ref 1.5–4.5)
Glucose: 103 mg/dL — ABNORMAL HIGH (ref 70–99)
Potassium: 4.3 mmol/L (ref 3.5–5.2)
Sodium: 141 mmol/L (ref 134–144)
Total Protein: 6.6 g/dL (ref 6.0–8.5)
eGFR: 96 mL/min/{1.73_m2} (ref >59)

## 2022-08-14 LAB — LIPID PANEL WITH LDL/HDL RATIO
Cholesterol, Total: 144 mg/dL (ref 100–199)
HDL: 44 mg/dL (ref >39)
LDL Chol Calc (NIH): 66 mg/dL (ref 0–99)
LDL/HDL Ratio: 1.5 ratio (ref 0.0–3.6)
Triglycerides: 205 mg/dL — ABNORMAL HIGH (ref 0–149)
VLDL Cholesterol Cal: 34 mg/dL (ref 5–40)

## 2022-08-14 LAB — INSULIN, RANDOM: INSULIN: 9.7 u[IU]/mL (ref 2.6–24.9)

## 2022-08-14 LAB — HEMOGLOBIN A1C
Est. average glucose Bld gHb Est-mCnc: 123 mg/dL
Hgb A1c MFr Bld: 5.9 % — ABNORMAL HIGH (ref 4.8–5.6)

## 2022-08-14 LAB — VITAMIN D 25 HYDROXY (VIT D DEFICIENCY, FRACTURES): Vit D, 25-Hydroxy: 49.8 ng/mL (ref 30.0–100.0)

## 2022-08-15 ENCOUNTER — Other Ambulatory Visit (INDEPENDENT_AMBULATORY_CARE_PROVIDER_SITE_OTHER): Payer: Self-pay | Admitting: Family Medicine

## 2022-08-15 DIAGNOSIS — R7303 Prediabetes: Secondary | ICD-10-CM

## 2022-08-21 NOTE — Progress Notes (Signed)
Chief Complaint:   OBESITY Bryan [name] is here to discuss his progress with his obesity treatment plan along with follow-up of his obesity related diagnoses. Bryan [name] is on the Category 3 Plan and states he is following his eating plan approximately 60% of the time. [name] states he is walking 45 minutes 2 times per week.  Today's visit was #: 55 Starting weight: 326 lbs Starting date: 09/08/2017 Today's weight: 309 lbs Today's date: 08/13/2022 Total lbs lost to date: 17 Total lbs lost since last in-office visit: 1  Interim History: Bryan [name] is here for a follow up office visit.  We reviewed his meal plan and all questions were answered.  Patient's food recall appears to be accurate and consistent with what is on plan when he is following it.   When eating on plan, his hunger and cravings are well controlled.     Subjective:   1. SOB (shortness of breath) on exertion Bryan [name] notes shortness of breath with exercising and seems to be worsening over time with weight gain. He notes getting out of breath sooner with activity than he used to when he weighed less. Bryan [name] denies Lower Ext edema, shortness of breath at rest, PND or orthopnea.  2. Vitamin D deficiency He is currently taking prescription vitamin D 50,000 IU each week. He denies nausea, vomiting or muscle weakness.  3. Essential hypertension Cardiovascular ROS: no chest pain or dyspnea on exertion.  4. Pre-diabetes Bryan [name] is tolerating medication(s) well without side effects.  Medication compliance is good as patient endorses taking it as prescribed.  The patient denies additional concerns regarding this condition.     Pt reports good control of hunger and cravings, especially with the liraglutide as well.   Assessment/Plan:   Orders Placed This Encounter  Procedures   Hemoglobin A1c   Insulin, random   VITAMIN D 25 Hydroxy (Vit-D Deficiency, Fractures)   Comprehensive metabolic panel   Lipid Panel With  LDL/HDL Ratio   Comprehensive metabolic panel    Medications Discontinued During This Encounter  Medication Reason   Vitamin D, Ergocalciferol, (DRISDOL) 1.25 MG (50000 UNIT) CAPS capsule Reorder   Liraglutide -Weight Management (SAXENDA) 18 MG/3ML SOPN Reorder   metFORMIN (GLUCOPHAGE) 500 MG tablet Reorder     Meds ordered this encounter  Medications   DISCONTD: Liraglutide -Weight Management (SAXENDA) 18 MG/3ML SOPN    Sig: Inject 3 mg into the skin daily.    Dispense:  15 mL    Refill:  0   Vitamin D, Ergocalciferol, (DRISDOL) 1.25 MG (50000 UNIT) CAPS capsule    Sig: Take 1 capsule (50,000 Units total) by mouth every Sunday.    Dispense:  4 capsule    Refill:  0    30 d supply;  ** OV for RF **   Do not send RF request   metFORMIN (GLUCOPHAGE) 500 MG tablet    Sig: TAKE 1 TABLET(500 MG) BY MOUTH THREE TIMES DAILY    Dispense:  90 tablet    Refill:  0     1. SOB (shortness of breath) on exertion Repeat IC decreased by 300 cal with pt's 17 lbs weight loss. His prior REE was 2793 on 03/2020. This is still 150 kcal less than calculated. I discussed with pt how to increase metabolism and continue with his weight loss.  2. Vitamin D deficiency Low Vitamin D level contributes to fatigue and are associated with obesity, breast, and colon cancer. He agrees to continue  to take prescription Vitamin D '@50'$ ,000 IU every week and will follow-up for routine testing of Vitamin D, at least 2-3 times per year to avoid over-replacement.  Lab/Orders today or future: - VITAMIN D 25 Hydroxy (Vit-D Deficiency, Fractures)  Refill- Vitamin D, Ergocalciferol, (DRISDOL) 1.25 MG (50000 UNIT) CAPS capsule; Take 1 capsule (50,000 Units total) by mouth every Sunday.  Dispense: 4 capsule; Refill: 0  3. Essential hypertension BP at goal today. Mitsugi is working on healthy weight loss and exercise to improve blood pressure control. We will watch for signs of hypotension as he continues his lifestyle  modifications. Continue Cardizem, HCTZ, Cozaar, and Toprol XL per cardiology/PCP. Pt is also on Lipitor for HDL.  Lab/Orders today or future: - Comprehensive metabolic panel - Lipid Panel With LDL/HDL Ratio  4. Pre-diabetes Random will continue to work on weight loss, exercise, and decreasing simple carbohydrates to help decrease the risk of diabetes.   Lab/Orders today or future: - Hemoglobin A1c - Insulin, random  Refill- metFORMIN (GLUCOPHAGE) 500 MG tablet; TAKE 1 TABLET(500 MG) BY MOUTH THREE TIMES DAILY  Dispense: 90 tablet; Refill: 0  5. Obesity with current BMI 43.2 [name] is currently in the action stage of change. As such, his goal is to continue with weight loss efforts. He has agreed to change to the Category 3 Plan, with 6 oz at lunch.   Pt's goal is to eat on plan 80% of the time.  We discussed various medication options to help Bryan [name] with his weight loss efforts and we both agreed to continue Saxenda as per below. Refill- Liraglutide -Weight Management (SAXENDA) 18 MG/3ML SOPN; Inject 3 mg into the skin daily.  Dispense: 15 mL; Refill: 0  Exercise goals: Increase exercise to 3-5 days a week. For substantial health benefits, adults should do at least 150 minutes (2 hours and 30 minutes) a week of moderate-intensity, or 75 minutes (1 hour and 15 minutes) a week of vigorous-intensity aerobic physical activity, or an equivalent combination of moderate- and vigorous-intensity aerobic activity. Aerobic activity should be performed in episodes of at least 10 minutes, and preferably, it should be spread throughout the week.  Behavioral modification strategies: meal planning and cooking strategies, keeping healthy foods in the home, and planning for success.  Bryan [name] has agreed to follow-up with our clinic in 3 weeks with [name]. He was informed of the importance of frequent follow-up visits to maximize his success with intensive lifestyle modifications for his multiple  health conditions.   Bryan [name] was informed we would discuss his lab results at his next visit unless there is a critical issue that needs to be addressed sooner. Bryan [name] agreed to keep his next visit at the agreed upon time to discuss these results.   Objective:   Blood pressure 128/77, pulse 62, temperature 98.1 F (36.7 C), height '5\' 11"'$  (1.803 m), weight (!) 309 lb (140.2 kg), SpO2 98 %. Body mass index is 43.1 kg/m.  General: Cooperative, alert, well developed, in no acute distress. HEENT: Conjunctivae and lids unremarkable. Cardiovascular: Regular rhythm.  Lungs: Normal work of breathing. Neurologic: No focal deficits.   Lab Results  Component Value Date   CREATININE 0.92 08/13/2022   BUN 18 08/13/2022   NA 141 08/13/2022   K 4.3 08/13/2022   CL 103 08/13/2022   CO2 20 08/13/2022   Lab Results  Component Value Date   ALT 28 08/13/2022   AST 20 08/13/2022   ALKPHOS 75 08/13/2022   BILITOT 0.4 08/13/2022  Lab Results  Component Value Date   HGBA1C 5.9 (H) 08/13/2022   HGBA1C 5.8 09/15/2021   HGBA1C 6.2 (H) 02/10/2021   HGBA1C 6.3 (H) 09/11/2020   HGBA1C 6.3 (H) 05/29/2020   Lab Results  Component Value Date   INSULIN 9.7 08/13/2022   INSULIN 22.6 02/10/2021   INSULIN 20.9 05/29/2020   INSULIN 24.0 01/10/2020   INSULIN 21.9 11/17/2018   Lab Results  Component Value Date   TSH 1.600 05/25/2021   Lab Results  Component Value Date   CHOL 144 08/13/2022   HDL 44 08/13/2022   LDLCALC 66 08/13/2022   LDLDIRECT 136.0 11/16/2016   TRIG 205 (H) 08/13/2022   CHOLHDL 4 07/07/2019   Lab Results  Component Value Date   VD25OH 49.8 08/13/2022   VD25OH 55.45 09/15/2021   VD25OH 47.1 02/10/2021   Lab Results  Component Value Date   WBC 7.9 12/31/2021   HGB 15.4 12/31/2021   HCT 44.8 12/31/2021   MCV 84 12/31/2021   PLT 301 12/31/2021    Attestation Statements:   Reviewed by clinician on day of visit: allergies, medications, problem list, medical  history, surgical history, family history, social history, and previous encounter notes.  I, Kathlene November, BS, CMA, am acting as transcriptionist for [employer], DO.    I have reviewed the above documentation for accuracy and completeness, and I agree with the above. Marjory Sneddon, D.O.  The [place] was signed into law in 2016 which includes the topic of electronic health records.  This provides immediate access to information in MyChart.  This includes consultation notes, operative notes, office notes, lab results and pathology reports.  If you have any questions about what you read please let us know at your next visit so we can discuss your concerns and take corrective action if need be.  We are right here with you.

## 2022-08-24 ENCOUNTER — Other Ambulatory Visit (INDEPENDENT_AMBULATORY_CARE_PROVIDER_SITE_OTHER): Payer: Self-pay | Admitting: Family Medicine

## 2022-08-24 DIAGNOSIS — I10 Essential (primary) hypertension: Secondary | ICD-10-CM

## 2022-08-29 ENCOUNTER — Other Ambulatory Visit (INDEPENDENT_AMBULATORY_CARE_PROVIDER_SITE_OTHER): Payer: Self-pay | Admitting: Family Medicine

## 2022-08-29 DIAGNOSIS — I10 Essential (primary) hypertension: Secondary | ICD-10-CM

## 2022-09-03 ENCOUNTER — Encounter (INDEPENDENT_AMBULATORY_CARE_PROVIDER_SITE_OTHER): Payer: Self-pay | Admitting: Family Medicine

## 2022-09-03 ENCOUNTER — Other Ambulatory Visit (INDEPENDENT_AMBULATORY_CARE_PROVIDER_SITE_OTHER): Payer: Self-pay | Admitting: Family Medicine

## 2022-09-03 ENCOUNTER — Ambulatory Visit (INDEPENDENT_AMBULATORY_CARE_PROVIDER_SITE_OTHER): Payer: BC Managed Care – PPO | Admitting: Family Medicine

## 2022-09-03 VITALS — BP 111/69 | HR 72 | Temp 98.2°F | Ht 71.0 in | Wt 309.0 lb

## 2022-09-03 DIAGNOSIS — R7303 Prediabetes: Secondary | ICD-10-CM

## 2022-09-03 DIAGNOSIS — E781 Pure hyperglyceridemia: Secondary | ICD-10-CM

## 2022-09-03 DIAGNOSIS — Z6841 Body Mass Index (BMI) 40.0 and over, adult: Secondary | ICD-10-CM

## 2022-09-03 DIAGNOSIS — I10 Essential (primary) hypertension: Secondary | ICD-10-CM | POA: Diagnosis not present

## 2022-09-03 DIAGNOSIS — E669 Obesity, unspecified: Secondary | ICD-10-CM

## 2022-09-03 MED ORDER — LOSARTAN POTASSIUM 50 MG PO TABS: 50.0000 mg | ORAL_TABLET | Freq: Every day | ORAL | 0 refills | Status: DC

## 2022-09-03 MED ORDER — BD PEN NEEDLE NANO U/F 32G X 4 MM MISC: 1.0000 | Freq: Every day | 0 refills | Status: DC

## 2022-09-03 MED ORDER — SAXENDA 18 MG/3ML ~~LOC~~ SOPN: 3.0000 mg | PEN_INJECTOR | Freq: Every day | SUBCUTANEOUS | 0 refills | Status: DC

## 2022-09-08 NOTE — Progress Notes (Signed)
Chief Complaint:   OBESITY Bryan [name] is here to discuss his progress with his obesity treatment plan along with follow-up of his obesity related diagnoses. [name] is on the Category 3 Plan with 6 oz of protein at lunch and states he is following his eating plan approximately 70% of the time. [name] states he is walking for 30 minutes 4 times per week.  Today's visit was #: 49 Starting weight: 326 lbs Starting date: 09/08/2017 Today's weight: 309 lbs Today's date: 09/03/2022 Total lbs lost to date: 17 Total lbs lost since last in-office visit: 0  Interim History: Bryan [name] has done well with maintaining his weight loss. He will be traveling more and he is making strategies to make better food choices.   Subjective:   1. Essential hypertension Jorge's blood pressure is well controlled on his medications, and with diet and exercise.   2. Pre-diabetes Bryan [name] is working on his diet and exercise. He has no side effects with his medications.  3. High triglycerides Bryan [name]'s last triglycerides were elevated but improving from his last labs.   Assessment/Plan:   1. Essential hypertension We will refill losartan for 1 month. [name] will continue his other medications as well as his diet.   - losartan (COZAAR) 50 MG tablet; Take 1 tablet (50 mg total) by mouth daily.  Dispense: 30 tablet; Refill: 0  2. Pre-diabetes [name] will continue metformin and GLP-1. He will continue with his diet, exercise, and decreasing simple carbohydrates to help decrease the risk of diabetes.   3. High triglycerides [name] will continue with his diet, exercise, and weight loss.   4. Obesity, Current BMI 43.1 Bryan [name] is currently in the action stage of change. As such, his goal is to continue with weight loss efforts. He has agreed to the Category 3 Plan.   We discussed various medication options to help Bryan [name] with his weight loss efforts and we both agreed to continue Saxenda, and we will refill  for 1 month, and refill pen needles #100.  - Insulin Pen Needle (BD PEN NEEDLE NANO U/F) 32G X 4 MM MISC; 1 each by Does not apply route daily. Use one pen needle once daily to inject Saxenda.  Dispense: 100 each; Refill: 0 - Liraglutide -Weight Management (SAXENDA) 18 MG/3ML SOPN; Inject 3 mg into the skin daily.  Dispense: 15 mL; Refill: 0  Exercise goals: As is.   Behavioral modification strategies: increasing lean protein intake and travel eating strategies.  Bryan [name] has agreed to follow-up with our clinic in 3 to 4 weeks. He was informed of the importance of frequent follow-up visits to maximize his success with intensive lifestyle modifications for his multiple health conditions.   Objective:   Blood pressure 111/69, pulse 72, temperature 98.2 F (36.8 C), height '5\' 11"'$  (1.803 m), weight (!) 309 lb (140.2 kg), SpO2 95 %. Body mass index is 43.1 kg/m.  General: Cooperative, alert, well developed, in no acute distress. HEENT: Conjunctivae and lids unremarkable. Cardiovascular: Regular rhythm.  Lungs: Normal work of breathing. Neurologic: No focal deficits.   Lab Results  Component Value Date   CREATININE 0.92 08/13/2022   BUN 18 08/13/2022   NA 141 08/13/2022   K 4.3 08/13/2022   CL 103 08/13/2022   CO2 20 08/13/2022   Lab Results  Component Value Date   ALT 28 08/13/2022   AST 20 08/13/2022   ALKPHOS 75 08/13/2022   BILITOT 0.4 08/13/2022   Lab Results  Component Value Date   HGBA1C  5.9 (H) 08/13/2022   HGBA1C 5.8 09/15/2021   HGBA1C 6.2 (H) 02/10/2021   HGBA1C 6.3 (H) 09/11/2020   HGBA1C 6.3 (H) 05/29/2020   Lab Results  Component Value Date   INSULIN 9.7 08/13/2022   INSULIN 22.6 02/10/2021   INSULIN 20.9 05/29/2020   INSULIN 24.0 01/10/2020   INSULIN 21.9 11/17/2018   Lab Results  Component Value Date   TSH 1.600 05/25/2021   Lab Results  Component Value Date   CHOL 144 08/13/2022   HDL 44 08/13/2022   LDLCALC 66 08/13/2022   LDLDIRECT 136.0  11/16/2016   TRIG 205 (H) 08/13/2022   CHOLHDL 4 07/07/2019   Lab Results  Component Value Date   VD25OH 49.8 08/13/2022   VD25OH 55.45 09/15/2021   VD25OH 47.1 02/10/2021   Lab Results  Component Value Date   WBC 7.9 12/31/2021   HGB 15.4 12/31/2021   HCT 44.8 12/31/2021   MCV 84 12/31/2021   PLT 301 12/31/2021   No results found for: "IRON", "TIBC", "FERRITIN"  Attestation Statements:   Reviewed by clinician on day of visit: allergies, medications, problem list, medical history, surgical history, family history, social history, and previous encounter notes.   I, Trixie Dredge, am acting as transcriptionist for Dennard Nip, MD.  I have reviewed the above documentation for accuracy and completeness, and I agree with the above. -  Dennard Nip, MD

## 2022-09-16 ENCOUNTER — Other Ambulatory Visit (INDEPENDENT_AMBULATORY_CARE_PROVIDER_SITE_OTHER): Payer: Self-pay | Admitting: Family Medicine

## 2022-09-16 DIAGNOSIS — E559 Vitamin D deficiency, unspecified: Secondary | ICD-10-CM

## 2022-09-17 ENCOUNTER — Encounter: Payer: BC Managed Care – PPO | Admitting: Internal Medicine

## 2022-09-18 ENCOUNTER — Other Ambulatory Visit (HOSPITAL_COMMUNITY): Payer: Self-pay

## 2022-09-22 ENCOUNTER — Ambulatory Visit (INDEPENDENT_AMBULATORY_CARE_PROVIDER_SITE_OTHER): Payer: BC Managed Care – PPO | Admitting: Internal Medicine

## 2022-09-22 ENCOUNTER — Encounter: Payer: Self-pay | Admitting: Internal Medicine

## 2022-09-22 VITALS — BP 126/76 | HR 71 | Temp 98.0°F | Resp 18 | Ht 71.0 in | Wt 318.0 lb

## 2022-09-22 DIAGNOSIS — Z23 Encounter for immunization: Secondary | ICD-10-CM | POA: Diagnosis not present

## 2022-09-22 DIAGNOSIS — Z0000 Encounter for general adult medical examination without abnormal findings: Secondary | ICD-10-CM | POA: Diagnosis not present

## 2022-09-22 DIAGNOSIS — I10 Essential (primary) hypertension: Secondary | ICD-10-CM

## 2022-09-22 DIAGNOSIS — Z1211 Encounter for screening for malignant neoplasm of colon: Secondary | ICD-10-CM

## 2022-09-22 MED ORDER — HYDROCHLOROTHIAZIDE 25 MG PO TABS: 25.0000 mg | ORAL_TABLET | Freq: Every day | ORAL | 1 refills | Status: DC

## 2022-09-22 NOTE — Progress Notes (Signed)
Subjective:    Patient ID: Bryan [name], male    DOB: 1961/08/05, 61 y.o.   MRN: [id number]  DOS:  09/22/2022 Type of visit - description: CPX  Here for CPX. In general doing well. Denies chest pain or difficulty breathing Denies any GI symptoms.  Review of Systems  Other than above, a 14 point review of systems is negative      Past Medical History:  Diagnosis Date   Allergy    Dental crowns present    Hyperlipidemia    Lower back pain    Nephrolithiasis 2017   Prediabetes 09/04/2014   Seasonal allergies    Sebaceous cyst 04/2013   upper back   Sleep apnea    uses CPAP nightly   Urolithiasis 10/2016    Past Surgical History:  Procedure Laterality Date   A-FLUTTER ABLATION N/A 01/19/2022   Procedure: A-FLUTTER ABLATION;  Surgeon: Vickie Epley, MD;  Location: [facility];  Service: Cardiovascular;  Laterality: N/A;   ADENOIDECTOMY     as a child   bone marrow donor     under anesthesia   CHOLECYSTECTOMY  01/12/2012   Procedure: LAPAROSCOPIC CHOLECYSTECTOMY WITH INTRAOPERATIVE CHOLANGIOGRAM;  Surgeon: [name], MD;  Location: [facility];  Service: General;  Laterality: N/A;   COLONOSCOPY     CYST EXCISION     cyst removal from back   EAR CYST EXCISION N/A 05/04/2013   Procedure: Excision subcutaneous mass posterior neck;  Surgeon: Imogene Burn. [name], MD;  Location: [place];  Service: General;  Laterality: N/A;   POLYPECTOMY     wisdom teeth     Social History   Socioeconomic History   Marital status: Married    Spouse name: Patty   Number of children: 3   Years of education: Not on file   Highest education level: Not on file  Occupational History   Occupation:  manufactoring rep, A/C units     Employer: [employer]  Tobacco Use   Smoking status: Never   Smokeless tobacco: Never  Vaping Use   Vaping Use: Never used  Substance and Sexual Activity   Alcohol use: Yes    Alcohol/week: 3.0 - 4.0 standard drinks of  alcohol    Types: 3 - 4 Glasses of wine per week    Comment: 1 glass wine every other day   Drug use: No   Sexual activity: Not on file  Other Topics Concern   Not on file  Social History Narrative   3 children, 1 at home,  2 in college    Wife w/ breast ca dx 2016, doing well   she developed SZs 2017, sees Dr Jannifer Franklin          Social Determinants of Health   Financial Resource Strain: Not on file  Food Insecurity: Not on file  Transportation Needs: Not on file  Physical Activity: Not on file  Stress: Not on file  Social Connections: Not on file  Intimate Partner Violence: Not on file    Current Outpatient Medications  Medication Instructions   atorvastatin (LIPITOR) 20 mg, Oral, Every evening   azelastine (ASTELIN) 0.1 % nasal spray 2 sprays, Each Nare, At bedtime PRN, Use in each nostril as directed    buPROPion (WELLBUTRIN SR) 150 MG 12 hr tablet 1 po BID   CO ENZYME Q-10 PO 1 capsule, Oral, Every morning   diltiazem (CARDIZEM) 30 MG tablet Take 1 - 2 tablets by mouth every  6 hours as needed for heart rate greater than 130   fish oil-omega-3 fatty acids 1 g, Oral, Every morning   hydrochlorothiazide (HYDRODIURIL) 25 mg, Oral, Daily   Insulin Pen Needle (BD PEN NEEDLE NANO U/F) 32G X 4 MM MISC 1 each, Does not apply, Daily, Use one pen needle once daily to inject Saxenda.    losartan (COZAAR) 50 mg, Oral, Daily   Melatonin 5 mg, Oral, Daily at bedtime   metFORMIN (GLUCOPHAGE) 500 MG tablet TAKE 1 TABLET(500 MG) BY MOUTH THREE TIMES DAILY   metoprolol succinate (TOPROL-XL) 25 MG 24 hr tablet TAKE 1 TABLET(25 MG) BY MOUTH AT BEDTIME   Multiple Vitamins-Minerals (CENTRUM PO) 1 tablet, Oral, Every morning   Niacin (VITAMIN B-3 PO) 1 tablet, Oral, Every morning   Saxenda 3 mg, Subcutaneous, Daily   Vitamin D (Ergocalciferol) (DRISDOL) 50,000 Units, Oral, Every Sun       Objective:   Physical Exam BP 126/76   Pulse 71   Temp 98 F (36.7 C) (Oral)   Resp 18   Ht '5\' 11"'$   (1.803 m)   Wt (!) 318 lb (144.2 kg)   SpO2 95%   BMI 44.35 kg/m  General: Well developed, NAD, BMI noted Neck: No  thyromegaly  HEENT:  Normocephalic . Face symmetric, atraumatic Lungs:  CTA B Normal respiratory effort, no intercostal retractions, no accessory muscle use. Heart: RRR,  no murmur.  Abdomen:  Not distended, soft, non-tender. No rebound or rigidity.   Lower extremities: no pretibial edema bilaterally  Skin: Exposed areas without rash. Not pale. Not jaundice Neurologic:  alert & oriented X3.  Speech normal, gait appropriate for age and unassisted Strength symmetric and appropriate for age.  Psych: Cognition and judgment appear intact.  Cooperative with normal attention span and concentration.  Behavior appropriate. No anxious or depressed appearing.     Assessment     Assessment Prediabetes HTN Hyperlipidemia CV: Atrial flutter, ablation 01-2022 --> NSR.  Eliquis discontinue June 2023. Insomnia: on OTCs Morbid obesity, BMI 46 OSA, CPAP Seasonal allergies Kidney stone (first) 10-2016 Vit D def dx 09/2017 Retina detachment DX 06-2019  PLAN: Here for CPX Morbid obesity: Working with a wellness center, on Wellbutrin, metformin, Saxenda. HTN: BP is great, continue Cardizem, losartan, metoprolol, HCTZ (refilled). Hyperlipidemia: On Lipitor, well-controlled. History of A-fib, on NSR since atrial fibrillation 01-2022, not anticoagulated, plans to reach out to cardiology for his follow-up appointment RTC 1 year

## 2022-09-22 NOTE — Patient Instructions (Addendum)
It is time for your colonoscopy. Please reach out to gastroenterology: [phone]  Vaccines I recommend:  Covid booster  Check the  blood pressure regularly BP GOAL is between 110/65 and  135/85. If it is consistently higher or lower, let me know   [place], Carmine back for a physical exam in 1 year    Advanced care planning:  Do you have a "Living will", "Delta of attorney" ?   If you already have a living will or healthcare power of attorney, is recommended you bring the copy to be scanned in your chart. The document will be available to all the doctors you see in the system.  If you don't have one, please consider create one.  Advance care planning is a process that supports adults in  understanding and sharing their preferences regarding future medical care.   The patient's preferences are recorded in documents called Advance Directives.    Advanced directives are completed (and can be modified at any time) while the patient is in full mental capacity.   The documentation should be available at all times to the patient, the family and the healthcare providers.   This legal documents direct treatment decision making and/or appoint a surrogate to make the decision if the patient is not capable to do so.    Advance directives can be documented in many types of formats,  documents have names such as:  Lliving will  Durable power of attorney for healthcare (healthcare proxy or healthcare power of attorney)  Combined directives  Physician orders for life-sustaining treatment    More information at:  [email or web address]

## 2022-09-22 NOTE — Assessment & Plan Note (Signed)
Here for CPX Morbid obesity: Working with a wellness center, on [place], metformin, Saxenda. HTN: BP is great, continue Cardizem, losartan, metoprolol, HCTZ (refilled). Hyperlipidemia: On Lipitor, well-controlled. History of A-fib, on NSR since atrial fibrillation 01-2022, not anticoagulated, plans to reach out to cardiology for his follow-up appointment RTC 1 year

## 2022-09-22 NOTE — Assessment & Plan Note (Signed)
-  Td 09-2021 - s/p shingrex - PNM 23 : 2021 (h/o OSA, obesity) - Covid vax recommended, want to d/w cards  -Flu shot today -[facility] : +FH Mat. Aunt had colon Ca Cscope  aprox  12/07/2002,  normal . cscope 2015- polyps, next was due 2020, C-scope was postponed d/tcardiac issues.   Plan: GI referral, patient will contact gastroenterology.  -Prostate ca screening: No symptoms, PSA and DRE last year normal. - diet, exercise: sees the wellness clinic -Labs reviewed, no need for any blood work today.

## 2022-09-23 ENCOUNTER — Other Ambulatory Visit (INDEPENDENT_AMBULATORY_CARE_PROVIDER_SITE_OTHER): Payer: Self-pay | Admitting: Family Medicine

## 2022-09-28 ENCOUNTER — Other Ambulatory Visit (INDEPENDENT_AMBULATORY_CARE_PROVIDER_SITE_OTHER): Payer: Self-pay | Admitting: Family Medicine

## 2022-09-28 ENCOUNTER — Ambulatory Visit (INDEPENDENT_AMBULATORY_CARE_PROVIDER_SITE_OTHER): Payer: BC Managed Care – PPO | Admitting: Family Medicine

## 2022-09-28 ENCOUNTER — Encounter: Payer: Self-pay | Admitting: Internal Medicine

## 2022-09-28 ENCOUNTER — Encounter (INDEPENDENT_AMBULATORY_CARE_PROVIDER_SITE_OTHER): Payer: Self-pay | Admitting: Family Medicine

## 2022-09-28 DIAGNOSIS — F3289 Other specified depressive episodes: Secondary | ICD-10-CM | POA: Diagnosis not present

## 2022-09-28 DIAGNOSIS — R7303 Prediabetes: Secondary | ICD-10-CM

## 2022-09-28 DIAGNOSIS — I10 Essential (primary) hypertension: Secondary | ICD-10-CM | POA: Diagnosis not present

## 2022-09-28 DIAGNOSIS — E669 Obesity, unspecified: Secondary | ICD-10-CM

## 2022-09-28 DIAGNOSIS — Z6841 Body Mass Index (BMI) 40.0 and over, adult: Secondary | ICD-10-CM

## 2022-09-28 DIAGNOSIS — E559 Vitamin D deficiency, unspecified: Secondary | ICD-10-CM

## 2022-09-28 DIAGNOSIS — E66813 Obesity, class 3: Secondary | ICD-10-CM

## 2022-09-28 MED ORDER — SAXENDA 18 MG/3ML ~~LOC~~ SOPN: 3.0000 mg | PEN_INJECTOR | Freq: Every day | SUBCUTANEOUS | 0 refills | Status: DC

## 2022-09-28 MED ORDER — VITAMIN D (ERGOCALCIFEROL) 1.25 MG (50000 UNIT) PO CAPS: 50000.0000 [IU] | ORAL_CAPSULE | ORAL | 0 refills | Status: DC

## 2022-09-28 MED ORDER — METFORMIN HCL 500 MG PO TABS: ORAL_TABLET | ORAL | 0 refills | Status: DC

## 2022-09-28 MED ORDER — LOSARTAN POTASSIUM 50 MG PO TABS: 50.0000 mg | ORAL_TABLET | Freq: Every day | ORAL | 0 refills | Status: DC

## 2022-09-28 MED ORDER — BUPROPION HCL ER (SR) 150 MG PO TB12: ORAL_TABLET | ORAL | 0 refills | Status: DC

## 2022-10-02 ENCOUNTER — Other Ambulatory Visit (INDEPENDENT_AMBULATORY_CARE_PROVIDER_SITE_OTHER): Payer: Self-pay | Admitting: Family Medicine

## 2022-10-12 ENCOUNTER — Telehealth: Payer: Self-pay | Admitting: *Deleted

## 2022-10-12 NOTE — Progress Notes (Unsigned)
Chief Complaint:   OBESITY Bryan [name] is here to discuss his progress with his obesity treatment plan along with follow-up of his obesity related diagnoses. Bryan [name] is on the Category 3 Plan and states he is following his eating plan approximately 50% of the time. Bryan [name] states he is walking for 30-45 minutes 2-3 times per week.  Today's visit was #: 54 Starting weight: 326 lbs Starting date: 09/08/2017 Today's weight: 316 lbs Today's date: 09/28/2022 Total lbs lost to date: 10 Total lbs lost since last in-office visit: 0  Interim History: Patient has been traveling a lot lately for work; they had to entertain clients etc.  He denies issues with his meal plan.  Subjective:   1. Pre-diabetes Cottrell has a diagnosis of prediabetes based on his elevated HgA1c and was informed this puts him at greater risk of developing diabetes. He continues to work on diet and exercise to decrease his risk of diabetes. He denies nausea or hypoglycemia.  2. Vitamin D deficiency He is currently taking prescription vitamin D 50,000 IU each week. He denies nausea, vomiting or muscle weakness.  3. Essential hypertension Patient's blood pressure is at goal.  Cardiovascular ROS: no chest pain or dyspnea on exertion.  BP Readings from Last 3 Encounters:  09/28/22 112/68  09/22/22 126/76  09/03/22 111/69   4. Other depression with emotional eating Patient denies issues or concerns.  His emotional eating is fairly controlled.  He notes occasionally feeling hungry in the middle of the night, but very tolerable.  Assessment/Plan:  No orders of the defined types were placed in this encounter.   Medications Discontinued During This Encounter  Medication Reason   buPROPion (WELLBUTRIN SR) 150 MG 12 hr tablet Reorder   Vitamin D, Ergocalciferol, (DRISDOL) 1.25 MG (50000 UNIT) CAPS capsule Reorder   metFORMIN (GLUCOPHAGE) 500 MG tablet Reorder   Liraglutide -Weight Management (SAXENDA) 18 MG/3ML SOPN  Reorder   losartan (COZAAR) 50 MG tablet Reorder     Meds ordered this encounter  Medications   buPROPion (WELLBUTRIN SR) 150 MG 12 hr tablet    Sig: 1 po BID    Dispense:  60 tablet    Refill:  0    30 d supply;  ** OV for RF **   Do not send RF request   Liraglutide -Weight Management (SAXENDA) 18 MG/3ML SOPN    Sig: Inject 3 mg into the skin daily.    Dispense:  15 mL    Refill:  0   Vitamin D, Ergocalciferol, (DRISDOL) 1.25 MG (50000 UNIT) CAPS capsule    Sig: Take 1 capsule (50,000 Units total) by mouth every Sunday.    Dispense:  4 capsule    Refill:  0    30 d supply;  ** OV for RF **   Do not send RF request   losartan (COZAAR) 50 MG tablet    Sig: Take 1 tablet (50 mg total) by mouth daily.    Dispense:  30 tablet    Refill:  0    30 d supply;  ** OV for RF **   Do not send RF request   metFORMIN (GLUCOPHAGE) 500 MG tablet    Sig: TAKE 1 TABLET(500 MG) BY MOUTH THREE TIMES DAILY    Dispense:  90 tablet    Refill:  0     1. Pre-diabetes We will refill metformin for 1 month.  Bryan [name] will continue to work on weight loss, exercise, and decreasing simple carbohydrates to  help decrease the risk of diabetes.   - metFORMIN (GLUCOPHAGE) 500 MG tablet; TAKE 1 TABLET(500 MG) BY MOUTH THREE TIMES DAILY  Dispense: 90 tablet; Refill: 0  2. Vitamin D deficiency We will refill prescription Vitamin D 50,000 IU every week for 1 month. Patient will follow-up for routine testing of Vitamin D, at least 2-3 times per year to avoid over-replacement.  - Vitamin D, Ergocalciferol, (DRISDOL) 1.25 MG (50000 UNIT) CAPS capsule; Take 1 capsule (50,000 Units total) by mouth every Sunday.  Dispense: 4 capsule; Refill: 0  3. Essential hypertension We will refill losartan for 1 month.  Bryan [name] is working on healthy weight loss and exercise to improve blood pressure control. We will watch for signs of hypotension as he continues his lifestyle modifications.  - losartan (COZAAR) 50 MG tablet; Take  1 tablet (50 mg total) by mouth daily.  Dispense: 30 tablet; Refill: 0  4. Other depression with emotional eating Patient will continue Wellbutrin SR 150 mg twice daily, and we will refill for 1 month.  - buPROPion (WELLBUTRIN SR) 150 MG 12 hr tablet; 1 po BID  Dispense: 60 tablet; Refill: 0  5. Obesity, Current BMI 44.2 Patient will continue Saxenda 3 mg once daily, and we will refill for 1 month.  - Liraglutide -Weight Management (SAXENDA) 18 MG/3ML SOPN; Inject 3 mg into the skin daily.  Dispense: 15 mL; Refill: 0  Roldan is currently in the action stage of change. As such, his goal is to continue with weight loss efforts. He has agreed to the Category 3 Plan.   Exercise goals: For substantial health benefits, adults should do at least 150 minutes (2 hours and 30 minutes) a week of moderate-intensity, or 75 minutes (1 hour and 15 minutes) a week of vigorous-intensity aerobic physical activity, or an equivalent combination of moderate- and vigorous-intensity aerobic activity. Aerobic activity should be performed in episodes of at least 10 minutes, and preferably, it should be spread throughout the week.  Behavioral modification strategies: increasing lean protein intake, decreasing simple carbohydrates, and decreasing eating out.  Bryan [name] has agreed to follow-up with our clinic in 3 to 4 weeks. He was informed of the importance of frequent follow-up visits to maximize his success with intensive lifestyle modifications for his multiple health conditions.   Objective:   Blood pressure 112/68, pulse 65, temperature 98.3 F (36.8 C), height '5\' 11"'$  (1.803 m), weight (!) 316 lb (143.3 kg), SpO2 99 %. Body mass index is 44.07 kg/m.  General: Cooperative, alert, well developed, in no acute distress. HEENT: Conjunctivae and lids unremarkable. Cardiovascular: Regular rhythm.  Lungs: Normal work of breathing. Neurologic: No focal deficits.   Lab Results  Component Value Date   CREATININE  0.92 08/13/2022   BUN 18 08/13/2022   NA 141 08/13/2022   K 4.3 08/13/2022   CL 103 08/13/2022   CO2 20 08/13/2022   Lab Results  Component Value Date   ALT 28 08/13/2022   AST 20 08/13/2022   ALKPHOS 75 08/13/2022   BILITOT 0.4 08/13/2022   Lab Results  Component Value Date   HGBA1C 5.9 (H) 08/13/2022   HGBA1C 5.8 09/15/2021   HGBA1C 6.2 (H) 02/10/2021   HGBA1C 6.3 (H) 09/11/2020   HGBA1C 6.3 (H) 05/29/2020   Lab Results  Component Value Date   INSULIN 9.7 08/13/2022   INSULIN 22.6 02/10/2021   INSULIN 20.9 05/29/2020   INSULIN 24.0 01/10/2020   INSULIN 21.9 11/17/2018   Lab Results  Component Value Date  TSH 1.600 05/25/2021   Lab Results  Component Value Date   CHOL 144 08/13/2022   HDL 44 08/13/2022   LDLCALC 66 08/13/2022   LDLDIRECT 136.0 11/16/2016   TRIG 205 (H) 08/13/2022   CHOLHDL 4 07/07/2019   Lab Results  Component Value Date   VD25OH 49.8 08/13/2022   VD25OH 55.45 09/15/2021   VD25OH 47.1 02/10/2021   Lab Results  Component Value Date   WBC 7.9 12/31/2021   HGB 15.4 12/31/2021   HCT 44.8 12/31/2021   MCV 84 12/31/2021   PLT 301 12/31/2021   No results found for: "IRON", "TIBC", "FERRITIN"  Attestation Statements:   Reviewed by clinician on day of visit: allergies, medications, problem list, medical history, surgical history, family history, social history, and previous encounter notes.   Wilhemena Durie, am acting as transcriptionist for [employer], DO.   I have reviewed the above documentation for accuracy and completeness, and I agree with the above. Marjory Sneddon, D.O.  The [place] was signed into law in 2016 which includes the topic of electronic health records.  This provides immediate access to information in MyChart.  This includes consultation notes, operative notes, office notes, lab results and pathology reports.  If you have any questions about what you read please let us know at your next visit so  we can discuss your concerns and take corrective action if need be.  We are right here with you.

## 2022-10-12 NOTE — Telephone Encounter (Signed)
Yessi,   This pt is a documented difficult intubation and his procedure will need to be done at the hospital.    Thanks,  Hannahgrace Lalli 

## 2022-10-14 NOTE — Telephone Encounter (Signed)
Thank you Yes.  Bryan [name], Please contact this patient and let him know that anesthesia has identified him as a difficulty intubated airway.  Therefore, his procedure will need to be done in the outpatient arena.  Set him up for routine outpatient colonoscopy for history of colon polyps.  Thanks Dr. Henrene Pastor

## 2022-10-14 NOTE — Telephone Encounter (Signed)
Dr Henrene Pastor,  Please review and advice on scheduling direct at hospital vs. coming in for an office visit please.

## 2022-10-15 ENCOUNTER — Other Ambulatory Visit: Payer: Self-pay

## 2022-10-15 DIAGNOSIS — Z86010 Personal history of colonic polyps: Secondary | ICD-10-CM

## 2022-10-15 NOTE — Telephone Encounter (Signed)
Pt scheduled for colon at [facility] 12/15/22 at 9:30am. [facility]X#[id number].

## 2022-10-20 ENCOUNTER — Ambulatory Visit (AMBULATORY_SURGERY_CENTER): Payer: Self-pay | Admitting: *Deleted

## 2022-10-20 VITALS — Ht 71.0 in | Wt 322.4 lb

## 2022-10-20 DIAGNOSIS — Z86010 Personal history of colonic polyps: Secondary | ICD-10-CM

## 2022-10-20 DIAGNOSIS — Z800 Family history of malignant neoplasm of digestive organs: Secondary | ICD-10-CM

## 2022-10-20 MED ORDER — NA SULFATE-K SULFATE-MG SULF 17.5-3.13-1.6 GM/177ML PO SOLN: 1.0000 | Freq: Once | ORAL | 0 refills | Status: AC

## 2022-10-20 NOTE — Progress Notes (Signed)
No egg or soy allergy known to patient  No issues known to pt with past sedation with any surgeries or procedures Patient denies ever being told they had issues or difficulty with intubation  No FH of Malignant Hyperthermia Pt is not on diet pills Pt is not on  home 02  Pt is not on blood thinners  Pt denies issues with constipation  No A fib had A flutter,no blood thinner since may 2023,had ablation Have any cardiac testing pending--no Pt instructed to use [email or web address] or GoodRx for a price reduction on prep

## 2022-10-27 ENCOUNTER — Other Ambulatory Visit (HOSPITAL_BASED_OUTPATIENT_CLINIC_OR_DEPARTMENT_OTHER): Payer: Self-pay

## 2022-10-27 ENCOUNTER — Other Ambulatory Visit (INDEPENDENT_AMBULATORY_CARE_PROVIDER_SITE_OTHER): Payer: Self-pay | Admitting: Family Medicine

## 2022-10-27 ENCOUNTER — Encounter (INDEPENDENT_AMBULATORY_CARE_PROVIDER_SITE_OTHER): Payer: Self-pay | Admitting: Family Medicine

## 2022-10-27 ENCOUNTER — Telehealth (INDEPENDENT_AMBULATORY_CARE_PROVIDER_SITE_OTHER): Payer: BC Managed Care – PPO | Admitting: Family Medicine

## 2022-10-27 VITALS — Wt 312.0 lb

## 2022-10-27 DIAGNOSIS — E7849 Other hyperlipidemia: Secondary | ICD-10-CM | POA: Diagnosis not present

## 2022-10-27 DIAGNOSIS — Z6841 Body Mass Index (BMI) 40.0 and over, adult: Secondary | ICD-10-CM

## 2022-10-27 DIAGNOSIS — I10 Essential (primary) hypertension: Secondary | ICD-10-CM

## 2022-10-27 DIAGNOSIS — F3289 Other specified depressive episodes: Secondary | ICD-10-CM

## 2022-10-27 DIAGNOSIS — R7303 Prediabetes: Secondary | ICD-10-CM

## 2022-10-27 DIAGNOSIS — E669 Obesity, unspecified: Secondary | ICD-10-CM

## 2022-10-27 DIAGNOSIS — E559 Vitamin D deficiency, unspecified: Secondary | ICD-10-CM

## 2022-10-27 MED ORDER — METFORMIN HCL 500 MG PO TABS: ORAL_TABLET | ORAL | 0 refills | Status: DC

## 2022-10-27 MED ORDER — SAXENDA 18 MG/3ML ~~LOC~~ SOPN
3.0000 mg | PEN_INJECTOR | Freq: Every day | SUBCUTANEOUS | 0 refills | Status: DC
  Filled 2022-10-27 (×3): qty 15, 30d supply, fill #0

## 2022-10-27 MED ORDER — ATORVASTATIN CALCIUM 20 MG PO TABS: 20.0000 mg | ORAL_TABLET | Freq: Every evening | ORAL | 0 refills | Status: DC

## 2022-10-27 MED ORDER — BUPROPION HCL ER (SR) 150 MG PO TB12: ORAL_TABLET | ORAL | 0 refills | Status: DC

## 2022-10-27 MED ORDER — VITAMIN D (ERGOCALCIFEROL) 1.25 MG (50000 UNIT) PO CAPS: 50000.0000 [IU] | ORAL_CAPSULE | ORAL | 0 refills | Status: DC

## 2022-10-27 MED ORDER — HYDROCHLOROTHIAZIDE 25 MG PO TABS: 25.0000 mg | ORAL_TABLET | Freq: Every day | ORAL | 1 refills | Status: DC

## 2022-10-27 MED ORDER — LOSARTAN POTASSIUM 50 MG PO TABS: 50.0000 mg | ORAL_TABLET | Freq: Every day | ORAL | 0 refills | Status: DC

## 2022-10-28 ENCOUNTER — Other Ambulatory Visit (HOSPITAL_BASED_OUTPATIENT_CLINIC_OR_DEPARTMENT_OTHER): Payer: Self-pay

## 2022-11-02 NOTE — Progress Notes (Unsigned)
Cardiology Office Note Date:  11/04/2022  Patient ID:  Bryan, [name] [birth year], MRN [id number] PCP:  Colon Branch, MD  Electrophysiologist: Dr. Quentin Ore    Chief Complaint:  6 mo  History of Present Illness: Bryan [name] is a 61 y.o. male with history of OSA, HTN, AFlutter, RBBB, obesity  Referred to Dr. Quentin Ore via AFib clinic, 10/14/21, he was working on weight loss, following at [facility], with success. Planned for ablation  CTI ablation 01/19/22  Saw Dr. Quentin Ore 02/17/22, no recurrent symptoms of his AFlutter, planned to stop his [place] after 3 mo (May 13) if no recurrent arrhythmia.  Discussed recommendation to establish rhythm monitoring strategy via watch, loop.   C/w [facility], walking for exercise.[name] TOTAL lost 10lbs by their last note  TODAY He is doing well No symptoms or watch alerts to AFlutter, elevated HRs No CP,SOB No near syncope or syncope.  Had an independent carotid US for screening, reportedly was OK PMD manages his lipitor/statin   AF/AAD Hx AFlutter noted 05/25/21 CTI ablation 01/19/22  Past Medical History:  Diagnosis Date   Allergy    Dental crowns present    Hyperlipidemia    Hypertension    Lower back pain    Nephrolithiasis 2017   Prediabetes 09/04/2014   Seasonal allergies    Sebaceous cyst 04/2013   upper back   Sleep apnea    uses CPAP nightly   Urolithiasis 10/2016    Past Surgical History:  Procedure Laterality Date   A-FLUTTER ABLATION N/A 01/19/2022   Procedure: A-FLUTTER ABLATION;  Surgeon: Vickie Epley, MD;  Location: [facility];  Service: Cardiovascular;  Laterality: N/A;   ADENOIDECTOMY     as a child   bone marrow donor     under anesthesia   CHOLECYSTECTOMY  01/12/2012   Procedure: LAPAROSCOPIC CHOLECYSTECTOMY WITH INTRAOPERATIVE CHOLANGIOGRAM;  Surgeon: [name], MD;  Location: [facility];  Service: General;  Laterality: N/A;   COLONOSCOPY     CYST EXCISION      cyst removal from back   EAR CYST EXCISION N/A 05/04/2013   Procedure: Excision subcutaneous mass posterior neck;  Surgeon: [name], MD;  Location: [facility];  Service: General;  Laterality: N/A;   POLYPECTOMY     wisdom teeth      Current Outpatient Medications  Medication Sig Dispense Refill   atorvastatin (LIPITOR) 20 MG tablet Take 1 tablet (20 mg total) by mouth every evening. 30 tablet 0   azelastine (ASTELIN) 0.1 % nasal spray Place 2 sprays into both nostrils at bedtime as needed for rhinitis. Use in each nostril as directed 30 mL 5   buPROPion (WELLBUTRIN SR) 150 MG 12 hr tablet 1 po BID 60 tablet 0   CO ENZYME Q-10 PO Take 1 capsule by mouth in the morning.     diltiazem (CARDIZEM) 30 MG tablet Take 1 - 2 tablets by mouth every 6 hours as needed for heart rate greater than 130 30 tablet 0   fish oil-omega-3 fatty acids 1000 MG capsule Take 1 g by mouth in the morning.     hydrochlorothiazide (HYDRODIURIL) 25 MG tablet Take 1 tablet (25 mg total) by mouth daily. 90 tablet 1   Insulin Pen Needle (BD PEN NEEDLE NANO U/F) 32G X 4 MM MISC 1 each by Does not apply route daily. Use one pen needle once daily to inject Saxenda. 100 each 0  Liraglutide -Weight Management (SAXENDA) 18 MG/3ML SOPN Inject 3 mg into the skin daily. 15 mL 0   losartan (COZAAR) 50 MG tablet Take 1 tablet (50 mg total) by mouth daily. 30 tablet 0   Melatonin 3 MG TBDP Take 5 mg by mouth at bedtime.     metFORMIN (GLUCOPHAGE) 500 MG tablet TAKE 1 TABLET(500 MG) BY MOUTH THREE TIMES DAILY 90 tablet 0   metoprolol succinate (TOPROL-XL) 25 MG 24 hr tablet TAKE 1 TABLET(25 MG) BY MOUTH AT BEDTIME 90 tablet 3   Multiple Vitamins-Minerals (CENTRUM PO) Take 1 tablet by mouth in the morning.     Niacin (VITAMIN B-3 PO) Take 1 tablet by mouth in the morning.     Vitamin D, Ergocalciferol, (DRISDOL) 1.25 MG (50000 UNIT) CAPS capsule Take 1 capsule (50,000 Units total) by mouth every Sunday. 4 capsule  0   No current facility-administered medications for this visit.    Allergies:   Lisinopril   Social History:  The patient  reports that he has never smoked. He has never been exposed to tobacco smoke. He has never used smokeless tobacco. He reports current alcohol use of about 3.0 - 4.0 standard drinks of alcohol per week. He reports that he does not use drugs.   Family History:  The patient's family history includes Cancer in his brother and paternal grandmother; Cancer (age of onset: 69) in his father; Colon cancer (age of onset: 48) in his maternal aunt; Diabetes in his brother; Hyperlipidemia in his mother; Hypertension in his mother; Kidney disease in his father; Obesity in his mother; Ovarian cancer in his mother; Sleep apnea in his father.  ROS:  Please see the history of present illness.    All other systems are reviewed and otherwise negative.   PHYSICAL EXAM:  VS:  BP 138/84   Pulse 68   Ht '5\' 11"'$  (1.803 m)   Wt (!) 320 lb (145.2 kg)   SpO2 97%   BMI 44.63 kg/m  BMI: Body mass index is 44.63 kg/m. Well nourished, well developed, in no acute distress HEENT: normocephalic, atraumatic Neck: no JVD, carotid bruits or masses Cardiac:   RRR; no significant murmurs, no rubs, or gallops Lungs:   CTA b/l, no wheezing, rhonchi or rales Abd: soft, nontender MS: no deformity or  atrophy Ext:  no edema Skin: warm and dry, no rash Neuro:  No gross deficits appreciated Psych: euthymic mood, full affect   EKG:  not done today   01/19/22: EPS/ablation CONCLUSIONS:   1.  Symptomatic typical atrial flutter  2. Successful radiofrequency ablation of atrial flutter along the cavotricuspid isthmus with complete bidirectional isthmus block achieved.   3. No inducible arrhythmias following ablation.   4. No early apparent complications.   01/12/22: Cardiac CT IMPRESSION: 1. There is normal pulmonary vein drainage into the left atrium with ostial measurements above.   2. There is no  thrombus in the left atrial appendage but significant noise artifact limits sensitivity of this study for identifying thrombus. Consider TEE for further evaulation.   3. The esophagus runs in the left atrial midline and is not in proximity to any of the pulmonary vein ostia.   4. No obvious PFO/ASD but significant noise artifact limits sensitivity of study.   5. Normal coronary origin. Right dominance.   6. CAC score of 0 which is 0 percentile for age-, race-, and sex-matched controls.  Recent Labs: 12/31/2021: Hemoglobin 15.4; Platelets 301 08/13/2022: ALT 28; BUN 18; Creatinine, Ser 0.92;  Potassium 4.3; Sodium 141  08/13/2022: Cholesterol, Total 144; HDL 44; LDL Chol Calc (NIH) 66; Triglycerides 205   CrCl cannot be calculated (Patient's most recent lab result is older than the maximum 21 days allowed.).   Wt Readings from Last 3 Encounters:  11/04/22 (!) 320 lb (145.2 kg)  10/27/22 (!) 312 lb (141.5 kg)  10/20/22 (!) 322 lb 6.4 oz (146.2 kg)     Other studies reviewed: Additional studies/records reviewed today include: summarized above  ASSESSMENT AND PLAN:  AFlutter (typical) S/p ablation Off a/c No symptoms or alerts of arrhythmia Has an Apple watch for rhythm/HR surveillance   HTN Encouraged weight loss  Obesity Continues with the weight/wellness center   Disposition: F/u with [place] in a year, sooner if needed  Current medicines are reviewed at length with the patient today.  The patient did not have any concerns regarding medicines.  Venetia Night, PA-C 11/04/2022 8:20 AM     [facility]   [phone] (office)  [phone] (fax)

## 2022-11-04 ENCOUNTER — Encounter: Payer: Self-pay | Admitting: Physician Assistant

## 2022-11-04 ENCOUNTER — Ambulatory Visit: Payer: BC Managed Care – PPO | Attending: Physician Assistant | Admitting: Physician Assistant

## 2022-11-04 VITALS — BP 138/84 | HR 68 | Ht 71.0 in | Wt 320.0 lb

## 2022-11-04 DIAGNOSIS — I483 Typical atrial flutter: Secondary | ICD-10-CM | POA: Diagnosis not present

## 2022-11-04 DIAGNOSIS — I10 Essential (primary) hypertension: Secondary | ICD-10-CM | POA: Diagnosis not present

## 2022-11-04 NOTE — Patient Instructions (Signed)
Medication Instructions:   Your physician recommends that you continue on your current medications as directed. Please refer to the Current Medication list given to you today.   *If you need a refill on your cardiac medications before your next appointment, please call your pharmacy*   Lab Work: [facility]    If you have labs (blood work) drawn today and your tests are completely normal, you will receive your results only by: [facility] (if you have MyChart) OR A paper copy in the mail If you have any lab test that is abnormal or we need to change your treatment, we will call you to review the results.   Testing/Procedures: NONE ORDERED  TODAY     Follow-Up: At [facility], you and your health needs are our priority.  As part of our continuing mission to provide you with exceptional heart care, we have created designated Provider Care Teams.  These Care Teams include your primary Cardiologist (physician) and Advanced Practice Providers (APPs -  Physician Assistants and Nurse Practitioners) who all work together to provide you with the care you need, when you need it.  We recommend signing up for the patient portal called "MyChart".  Sign up information is provided on this After Visit Summary.  MyChart is used to connect with patients for Virtual Visits (Telemedicine).  Patients are able to view lab/test results, encounter notes, upcoming appointments, etc.  Non-urgent messages can be sent to your provider as well.   To learn more about what you can do with MyChart, go to [email or web address].    Your next appointment:   1 year(s)  The format for your next appointment:   In Person  Provider:   You may see Vickie Epley, MD or one of the following Advanced Practice Providers on your designated Care Team:   Tommye Standard, [place]   Other Instructions   Important Information About Sugar

## 2022-11-10 NOTE — Progress Notes (Unsigned)
TeleHealth Visit:  Due to the COVID-19 pandemic, this visit was completed with telemedicine (audio/video) technology to reduce patient and provider exposure as well as to preserve personal protective equipment.   Bryan [name] has verbally consented to this TeleHealth visit. The patient is located at home, the provider is located at the [facility] office. The participants in this visit include the listed provider and patient. The visit was conducted today via MyChart video.   Chief Complaint: OBESITY Bryan [name] is here to discuss his progress with his obesity treatment plan along with follow-up of his obesity related diagnoses. Bryan [name] is on the Category 3 Plan and states he is following his eating plan approximately (unknown)% of the time. Bryan [name] states he is on the treadmill and doing strengthening for 45 minutes 5 times per week.  Today's visit was #: 49 Starting weight: 326 lbs Starting date: 09/08/2017  Interim History: Bryan [name] continues to work on his diet.  He struggles to meet his protein goals.  He is doing better with planning his lunch and exercise more.  He and his family will be hosting Thanksgiving this year.  Subjective:   1. Vitamin D deficiency Bryan [name] is stable on vitamin D prescription, and he denies nausea, vomiting, or muscle weakness.  He needs a refill.  2. Pre-diabetes Bryan [name] continues to work on decreasing simple carbohydrates and increasing his exercise to help prevent progression to diabetes mellitus.  No side effects were noted on metformin.  3. Essential hypertension Bryan [name] has been stable on his medications, and he continues to work on his diet and weight loss.  He requests a refill today.  4. Other hyperlipidemia Bryan [name] is doing well on Lipitor with no side effects noted.  He is working on decreasing saturated fats and cholesterol in his diet.  5. Other depression with emotional eating Bryan [name] has been doing better with decreasing  emotional eating behaviors.  His mood is stable and no side effects were noted on his medication.  Assessment/Plan:   1. Vitamin D deficiency We will refill prescription Vitamin D for 1 month. Bryan [name] will follow-up for routine testing of Vitamin D, at least 2-3 times per year to avoid over-replacement.  - Vitamin D, Ergocalciferol, (DRISDOL) 1.25 MG (50000 UNIT) CAPS capsule; Take 1 capsule (50,000 Units total) by mouth every Sunday.  Dispense: 4 capsule; Refill: 0  2. Pre-diabetes We will refill metformin for 1 month. Bryan [name] will continue with his diet, exercise, and decreasing simple carbohydrates to help decrease the risk of diabetes.   - metFORMIN (GLUCOPHAGE) 500 MG tablet; TAKE 1 TABLET(500 MG) BY MOUTH THREE TIMES DAILY  Dispense: 90 tablet; Refill: 0  3. Essential hypertension Bryan [name] will continue his medications, and we will refill hydrochlorothiazide for 90 days; and losartan for 1 month.  - hydrochlorothiazide (HYDRODIURIL) 25 MG tablet; Take 1 tablet (25 mg total) by mouth daily.  Dispense: 90 tablet; Refill: 1 - losartan (COZAAR) 50 MG tablet; Take 1 tablet (50 mg total) by mouth daily.  Dispense: 30 tablet; Refill: 0  4. Other hyperlipidemia We will refill Lipitor for 1 month. We discussed several lifestyle modifications today. Bryan [name] will continue to work on diet, exercise and weight loss efforts. Orders and follow up as documented in patient record.   - atorvastatin (LIPITOR) 20 MG tablet; Take 1 tablet (20 mg total) by mouth every evening.  Dispense: 30 tablet; Refill: 0  5. Other depression with emotional eating Bryan [name] will continue Wellbutrin SR 150 mg twice daily, and we will  refill for 1 month.  - buPROPion (WELLBUTRIN SR) 150 MG 12 hr tablet; 1 po BID  Dispense: 60 tablet; Refill: 0  6. Obesity, Current BMI 44.2 Bryan [name] is currently in the action stage of change. As such, his goal is to continue with weight loss efforts. He has agreed to the Category 3 Plan.    Bryan [name] will continue Saxenda 3 mg once daily, and we will refill for 1 month.   - Liraglutide -Weight Management (SAXENDA) 18 MG/3ML SOPN; Inject 3 mg into the skin daily.  Dispense: 15 mL; Refill: 0  Exercise goals: As is.   Behavioral modification strategies: holiday eating strategies .  Bryan [name] has agreed to follow-up with our clinic in 4 weeks. He was informed of the importance of frequent follow-up visits to maximize his success with intensive lifestyle modifications for his multiple health conditions.  Objective:   VITALS: Per patient if applicable, see vitals. GENERAL: Alert and in no acute distress. CARDIOPULMONARY: No increased WOB. Speaking in clear sentences.  PSYCH: Pleasant and cooperative. Speech normal rate and rhythm. Affect is appropriate. Insight and judgement are appropriate. Attention is focused, linear, and appropriate.  NEURO: Oriented as arrived to appointment on time with no prompting.   Lab Results  Component Value Date   CREATININE 0.92 08/13/2022   BUN 18 08/13/2022   NA 141 08/13/2022   K 4.3 08/13/2022   CL 103 08/13/2022   CO2 20 08/13/2022   Lab Results  Component Value Date   ALT 28 08/13/2022   AST 20 08/13/2022   ALKPHOS 75 08/13/2022   BILITOT 0.4 08/13/2022   Lab Results  Component Value Date   HGBA1C 5.9 (H) 08/13/2022   HGBA1C 5.8 09/15/2021   HGBA1C 6.2 (H) 02/10/2021   HGBA1C 6.3 (H) 09/11/2020   HGBA1C 6.3 (H) 05/29/2020   Lab Results  Component Value Date   INSULIN 9.7 08/13/2022   INSULIN 22.6 02/10/2021   INSULIN 20.9 05/29/2020   INSULIN 24.0 01/10/2020   INSULIN 21.9 11/17/2018   Lab Results  Component Value Date   TSH 1.600 05/25/2021   Lab Results  Component Value Date   CHOL 144 08/13/2022   HDL 44 08/13/2022   LDLCALC 66 08/13/2022   LDLDIRECT 136.0 11/16/2016   TRIG 205 (H) 08/13/2022   CHOLHDL 4 07/07/2019   Lab Results  Component Value Date   VD25OH 49.8 08/13/2022   VD25OH 55.45 09/15/2021    VD25OH 47.1 02/10/2021   Lab Results  Component Value Date   WBC 7.9 12/31/2021   HGB 15.4 12/31/2021   HCT 44.8 12/31/2021   MCV 84 12/31/2021   PLT 301 12/31/2021   No results found for: "IRON", "TIBC", "FERRITIN"  Attestation Statements:   Reviewed by clinician on day of visit: allergies, medications, problem list, medical history, surgical history, family history, social history, and previous encounter notes.   I, Trixie Dredge, am acting as transcriptionist for Dennard Nip, MD.  I have reviewed the above documentation for accuracy and completeness, and I agree with the above. - Dennard Nip, MD

## 2022-11-13 ENCOUNTER — Encounter: Payer: BC Managed Care – PPO | Admitting: Internal Medicine

## 2022-11-24 ENCOUNTER — Encounter (INDEPENDENT_AMBULATORY_CARE_PROVIDER_SITE_OTHER): Payer: Self-pay | Admitting: Family Medicine

## 2022-11-24 ENCOUNTER — Other Ambulatory Visit (HOSPITAL_BASED_OUTPATIENT_CLINIC_OR_DEPARTMENT_OTHER): Payer: Self-pay

## 2022-11-24 ENCOUNTER — Ambulatory Visit (INDEPENDENT_AMBULATORY_CARE_PROVIDER_SITE_OTHER): Payer: BC Managed Care – PPO | Admitting: Family Medicine

## 2022-11-24 VITALS — BP 145/78 | HR 67 | Temp 98.1°F | Ht 71.0 in | Wt 310.8 lb

## 2022-11-24 DIAGNOSIS — Z6841 Body Mass Index (BMI) 40.0 and over, adult: Secondary | ICD-10-CM

## 2022-11-24 DIAGNOSIS — E559 Vitamin D deficiency, unspecified: Secondary | ICD-10-CM

## 2022-11-24 DIAGNOSIS — E669 Obesity, unspecified: Secondary | ICD-10-CM | POA: Diagnosis not present

## 2022-11-24 MED ORDER — SAXENDA 18 MG/3ML ~~LOC~~ SOPN
3.0000 mg | PEN_INJECTOR | Freq: Every day | SUBCUTANEOUS | 0 refills | Status: DC
  Filled 2022-11-24: qty 15, 30d supply, fill #0

## 2022-11-24 MED ORDER — VITAMIN D (ERGOCALCIFEROL) 1.25 MG (50000 UNIT) PO CAPS
50000.0000 [IU] | ORAL_CAPSULE | ORAL | 0 refills | Status: DC
  Filled 2022-11-24: qty 4, 28d supply, fill #0

## 2022-12-07 ENCOUNTER — Other Ambulatory Visit (INDEPENDENT_AMBULATORY_CARE_PROVIDER_SITE_OTHER): Payer: Self-pay | Admitting: Family Medicine

## 2022-12-07 DIAGNOSIS — E7849 Other hyperlipidemia: Secondary | ICD-10-CM

## 2022-12-09 ENCOUNTER — Encounter (HOSPITAL_COMMUNITY): Payer: Self-pay | Admitting: Internal Medicine

## 2022-12-09 ENCOUNTER — Encounter: Payer: Self-pay | Admitting: Internal Medicine

## 2022-12-09 NOTE — Progress Notes (Signed)
Attempted to obtain medical history via telephone, unable to reach at this time. HIPAA compliant voicemail message left requesting return call to pre surgical testing department. 

## 2022-12-11 ENCOUNTER — Other Ambulatory Visit (INDEPENDENT_AMBULATORY_CARE_PROVIDER_SITE_OTHER): Payer: Self-pay | Admitting: Family Medicine

## 2022-12-11 DIAGNOSIS — F3289 Other specified depressive episodes: Secondary | ICD-10-CM

## 2022-12-15 ENCOUNTER — Ambulatory Visit (HOSPITAL_COMMUNITY): Payer: BC Managed Care – PPO | Admitting: Certified Registered"

## 2022-12-15 ENCOUNTER — Encounter (HOSPITAL_COMMUNITY)
Admission: RE | Disposition: A | Discharge status: Home / Self Care | Payer: Self-pay | Source: Home / Self Care | Attending: Internal Medicine

## 2022-12-15 ENCOUNTER — Encounter (HOSPITAL_COMMUNITY): Payer: Self-pay | Admitting: Internal Medicine

## 2022-12-15 ENCOUNTER — Ambulatory Visit (HOSPITAL_COMMUNITY)
Admission: RE | Admit: 2022-12-15 | Discharge: 2022-12-15 | Disposition: A | Discharge status: Home / Self Care | Payer: BC Managed Care – PPO | Attending: Internal Medicine | Admitting: Internal Medicine

## 2022-12-15 ENCOUNTER — Other Ambulatory Visit: Payer: Self-pay

## 2022-12-15 DIAGNOSIS — D122 Benign neoplasm of ascending colon: Secondary | ICD-10-CM

## 2022-12-15 DIAGNOSIS — E119 Type 2 diabetes mellitus without complications: Secondary | ICD-10-CM | POA: Insufficient documentation

## 2022-12-15 DIAGNOSIS — Z7984 Long term (current) use of oral hypoglycemic drugs: Secondary | ICD-10-CM | POA: Insufficient documentation

## 2022-12-15 DIAGNOSIS — G4730 Sleep apnea, unspecified: Secondary | ICD-10-CM | POA: Insufficient documentation

## 2022-12-15 DIAGNOSIS — Z6841 Body Mass Index (BMI) 40.0 and over, adult: Secondary | ICD-10-CM | POA: Insufficient documentation

## 2022-12-15 DIAGNOSIS — Z1211 Encounter for screening for malignant neoplasm of colon: Secondary | ICD-10-CM | POA: Insufficient documentation

## 2022-12-15 DIAGNOSIS — Z09 Encounter for follow-up examination after completed treatment for conditions other than malignant neoplasm: Secondary | ICD-10-CM

## 2022-12-15 DIAGNOSIS — Z860100 Personal history of colon polyps, unspecified: Secondary | ICD-10-CM

## 2022-12-15 DIAGNOSIS — I4891 Unspecified atrial fibrillation: Secondary | ICD-10-CM | POA: Insufficient documentation

## 2022-12-15 DIAGNOSIS — I10 Essential (primary) hypertension: Secondary | ICD-10-CM | POA: Insufficient documentation

## 2022-12-15 DIAGNOSIS — K5730 Diverticulosis of large intestine without perforation or abscess without bleeding: Secondary | ICD-10-CM | POA: Insufficient documentation

## 2022-12-15 HISTORY — PX: POLYPECTOMY: SHX5525

## 2022-12-15 HISTORY — PX: COLONOSCOPY WITH PROPOFOL: SHX5780

## 2022-12-15 HISTORY — DX: Failed or difficult intubation, initial encounter: T88.4XXA

## 2022-12-15 LAB — GLUCOSE, CAPILLARY: Glucose-Capillary: 101 mg/dL — ABNORMAL HIGH (ref 70–99)

## 2022-12-15 SURGERY — COLONOSCOPY WITH PROPOFOL
Anesthesia: Monitor Anesthesia Care

## 2022-12-15 MED ORDER — LACTATED RINGERS IV SOLN
INTRAVENOUS | Status: DC
  Administered 2022-12-15: 1000 mL via INTRAVENOUS

## 2022-12-15 MED ORDER — PROPOFOL 500 MG/50ML IV EMUL
INTRAVENOUS | Status: DC | PRN
  Administered 2022-12-15: 150 ug/kg/min via INTRAVENOUS

## 2022-12-15 MED ORDER — PROPOFOL 10 MG/ML IV BOLUS
INTRAVENOUS | Status: DC | PRN
  Administered 2022-12-15 (×4): 20 mg via INTRAVENOUS

## 2022-12-15 MED ORDER — PROPOFOL 1000 MG/100ML IV EMUL
INTRAVENOUS | Status: AC
  Filled 2022-12-15: qty 100

## 2022-12-15 MED ORDER — SODIUM CHLORIDE 0.9 % IV SOLN: INTRAVENOUS | Status: DC

## 2022-12-15 MED ORDER — LIDOCAINE 2% (20 MG/ML) 5 ML SYRINGE
INTRAMUSCULAR | Status: DC | PRN
  Administered 2022-12-15: 100 mg via INTRAVENOUS

## 2022-12-15 MED ORDER — PROPOFOL 500 MG/50ML IV EMUL
INTRAVENOUS | Status: AC
  Filled 2022-12-15: qty 50

## 2022-12-15 SURGICAL SUPPLY — 22 items

## 2022-12-15 NOTE — Anesthesia Postprocedure Evaluation (Signed)
Anesthesia Post Note  Patient: Bryan [name]  Procedure(s) Performed: COLONOSCOPY WITH PROPOFOL POLYPECTOMY     Patient location during evaluation: Endoscopy Anesthesia Type: MAC Level of consciousness: awake and alert Pain management: pain level controlled Vital Signs Assessment: post-procedure vital signs reviewed and stable Respiratory status: spontaneous breathing, nonlabored ventilation, respiratory function stable and patient connected to nasal cannula oxygen Cardiovascular status: blood pressure returned to baseline and stable Postop Assessment: no apparent nausea or vomiting Anesthetic complications: no  No notable events documented.  Last Vitals:  Vitals:   12/15/22 0940 12/15/22 0948  BP: (!) 143/79 (!) 143/84  Pulse: 73 69  Resp: 13 15  Temp:    SpO2: 100% 99%    Last Pain:  Vitals:   12/15/22 0948  TempSrc:   PainSc: 0-No pain                 Primus Gritton L Jorie Zee

## 2022-12-15 NOTE — Discharge Instructions (Signed)
YOU HAD AN ENDOSCOPIC PROCEDURE TODAY: Refer to the procedure report and other information in the discharge instructions given to you for any specific questions about what was found during the examination. If this information does not answer your questions, please call [place] office at [phone] to clarify.  ° °YOU SHOULD EXPECT: Some feelings of bloating in the abdomen. Passage of more gas than usual. Walking can help get rid of the air that was put into your GI tract during the procedure and reduce the bloating. If you had a lower endoscopy (such as a colonoscopy or flexible sigmoidoscopy) you may notice spotting of blood in your stool or on the toilet paper. Some abdominal soreness may be present for a day or two, also. ° °DIET: Your first meal following the procedure should be a light meal and then it is ok to progress to your normal diet. A half-sandwich or bowl of soup is an example of a good first meal. Heavy or fried foods are harder to digest and may make you feel nauseous or bloated. Drink plenty of fluids but you should avoid alcoholic beverages for 24 hours. If you had a esophageal dilation, please see attached instructions for diet.   ° °ACTIVITY: Your care partner should take you home directly after the procedure. You should plan to take it easy, moving slowly for the rest of the day. You can resume normal activity the day after the procedure however YOU SHOULD NOT DRIVE, use power tools, machinery or perform tasks that involve climbing or major physical exertion for 24 hours (because of the sedation medicines used during the test).  ° °SYMPTOMS TO REPORT IMMEDIATELY: °A gastroenterologist can be reached at any hour. Please call [phone]  for any of the following symptoms:  °Following lower endoscopy (colonoscopy, flexible sigmoidoscopy) °Excessive amounts of blood in the stool  °Significant tenderness, worsening of abdominal pains  °Swelling of the abdomen that is new, acute  °Fever of 100° or  higher  °Following upper endoscopy (EGD, EUS, ERCP, esophageal dilation) °Vomiting of blood or coffee ground material  °New, significant abdominal pain  °New, significant chest pain or pain under the shoulder blades  °Painful or persistently difficult swallowing  °New shortness of breath  °Black, tarry-looking or red, bloody stools ° °FOLLOW UP:  °If any biopsies were taken you will be contacted by phone or by letter within the next 1-3 weeks. Call [phone]  if you have not heard about the biopsies in 3 weeks.  °Please also call with any specific questions about appointments or follow up tests. ° °

## 2022-12-15 NOTE — Anesthesia Procedure Notes (Signed)
Procedure Name: MAC Date/Time: 12/15/2022 8:50 AM  Performed by: Eben Burow, CRNAPre-anesthesia Checklist: Patient identified, Emergency Drugs available, Suction available, Patient being monitored and Timeout performed Oxygen Delivery Method: Simple face mask Placement Confirmation: positive ETCO2

## 2022-12-15 NOTE — Anesthesia Preprocedure Evaluation (Addendum)
Anesthesia Evaluation    Reviewed: Allergy & Precautions, Patient's Chart, lab work & pertinent test results  History of Anesthesia Complications (+) DIFFICULT AIRWAY and history of anesthetic complications (last airway note: grade 1 with [place])  Airway Mallampati: III  TM Distance: >3 FB Neck ROM: Full  Mouth opening: Limited Mouth Opening  Dental no notable dental hx. (+) Teeth Intact, Dental Advisory Given   Pulmonary sleep apnea and Continuous Positive Airway Pressure Ventilation    Pulmonary exam normal breath sounds clear to auscultation       Cardiovascular hypertension, Pt. on medications and Pt. on home beta blockers Normal cardiovascular exam+ dysrhythmias Atrial Fibrillation  Rhythm:Regular Rate:Normal  EKG RBBB  TTE 2022 . Left ventricular ejection fraction, by estimation, is 65 to 70%. The  left ventricle has normal function. The left ventricle has no regional  wall motion abnormalities. Left ventricular diastolic parameters are  consistent with Grade I diastolic  dysfunction (impaired relaxation).   2. Right ventricular systolic function is normal. The right ventricular  size is normal. Tricuspid regurgitation signal is inadequate for assessing  PA pressure.   3. The mitral valve is grossly normal. No evidence of mitral valve  regurgitation. No evidence of mitral stenosis.   4. The aortic valve is tricuspid. Aortic valve regurgitation is not  visualized. No aortic stenosis is present.   5. The inferior vena cava is normal in size with greater than 50%  respiratory variability, suggesting right atrial pressure of 3 mmHg.     Neuro/Psych  PSYCHIATRIC DISORDERS  Depression    negative neurological ROS     GI/Hepatic negative GI ROS, Neg liver ROS,,,  Endo/Other  diabetes, Type 2, Oral Hypoglycemic Agents  Morbid obesity (BMI 43)  Renal/GU negative Renal ROS  negative genitourinary    Musculoskeletal negative musculoskeletal ROS (+)    Abdominal   Peds  Hematology negative hematology ROS (+)   Anesthesia Other Findings   Reproductive/Obstetrics                             Anesthesia Physical Anesthesia Plan  ASA: 3  Anesthesia Plan: MAC   Post-op Pain Management:    Induction: Intravenous  PONV Risk Score and Plan: Propofol infusion and Treatment may vary due to age or medical condition  Airway Management Planned: Natural Airway  Additional Equipment:   Intra-op Plan:   Post-operative Plan:   Informed Consent: I have reviewed the patients History and Physical, chart, labs and discussed the procedure including the risks, benefits and alternatives for the proposed anesthesia with the patient or authorized representative who has indicated his/her understanding and acceptance.     Dental advisory given  Plan Discussed with: CRNA  Anesthesia Plan Comments:        Anesthesia Quick Evaluation

## 2022-12-15 NOTE — H&P (Signed)
HISTORY OF PRESENT ILLNESS:  Bryan [name] is a 62 y.o. male with a history of adenomatous colon polyps.  Last examination 2015.  He presents today for surveillance colonoscopy.  No complaints.  This case is being done at the hospital with monitored anesthesia care due to a previously identified difficult airway.  REVIEW OF SYSTEMS:  All non-GI ROS negative. Past Medical History:  Diagnosis Date   Allergy    Dental crowns present    Difficult intubation    Hyperlipidemia    Hypertension    Lower back pain    Nephrolithiasis 2017   Prediabetes 09/04/2014   Seasonal allergies    Sebaceous cyst 04/2013   upper back   Sleep apnea    uses CPAP nightly   Urolithiasis 10/2016    Past Surgical History:  Procedure Laterality Date   A-FLUTTER ABLATION N/A 01/19/2022   Procedure: A-FLUTTER ABLATION;  Surgeon: Vickie Epley, MD;  Location: [facility];  Service: Cardiovascular;  Laterality: N/A;   ADENOIDECTOMY     as a child   bone marrow donor     under anesthesia   CHOLECYSTECTOMY  01/12/2012   Procedure: LAPAROSCOPIC CHOLECYSTECTOMY WITH INTRAOPERATIVE CHOLANGIOGRAM;  Surgeon: [name], MD;  Location: [facility];  Service: General;  Laterality: N/A;   COLONOSCOPY     CYST EXCISION     cyst removal from back   EAR CYST EXCISION N/A 05/04/2013   Procedure: Excision subcutaneous mass posterior neck;  Surgeon: Imogene Burn. [name], MD;  Location: [place];  Service: General;  Laterality: N/A;   POLYPECTOMY     wisdom teeth      Social History [name]  reports that he has never smoked. He has never been exposed to tobacco smoke. He has never used smokeless tobacco. He reports current alcohol use of about 3.0 - 4.0 standard drinks of alcohol per week. He reports that he does not use drugs.  family history includes Cancer in his brother and paternal grandmother; Cancer (age of onset: 60) in his father; Colon cancer (age of onset: 31) in his  maternal aunt; Diabetes in his brother; Hyperlipidemia in his mother; Hypertension in his mother; Kidney disease in his father; Obesity in his mother; Ovarian cancer in his mother; Sleep apnea in his father.  Allergies  Allergen Reactions   Lisinopril Cough       PHYSICAL EXAMINATION: Vital signs: BP (!) 173/8   Pulse 78   Temp 98 F (36.7 C) (Tympanic)   Resp 15   Ht '5\' 11"'$  (1.803 m)   Wt (!) 140 kg   SpO2 97%   BMI 43.05 kg/m  General: Well-developed, well-nourished, no acute distress HEENT: Sclerae are anicteric, conjunctiva pink. Oral mucosa intact Lungs: Clear Heart: Regular Abdomen: soft, nontender, nondistended, no obvious ascites, no peritoneal signs, normal bowel sounds. No organomegaly. Extremities: No edema Psychiatric: alert and oriented x3. Cooperative     ASSESSMENT:  Personal history of adenomatous colon polyps   PLAN:  Surveillance colonoscopy

## 2022-12-15 NOTE — Transfer of Care (Signed)
Immediate Anesthesia Transfer of Care Note  Patient: Bryan [name]  Procedure(s) Performed: COLONOSCOPY WITH PROPOFOL POLYPECTOMY  Patient Location: PACU and Endoscopy Unit  Anesthesia Type:MAC  Level of Consciousness: awake, alert , and patient cooperative  Airway & Oxygen Therapy: Patient Spontanous Breathing and Patient connected to face mask oxygen  Post-op Assessment: Report given to RN and Post -op Vital signs reviewed and stable  Post vital signs: Reviewed and stable  Last Vitals:  Vitals Value Taken Time  BP 149/76 12/15/22 0926  Temp    Pulse 75 12/15/22 0928  Resp 17 12/15/22 0928  SpO2 100 % 12/15/22 0928  Vitals shown include unvalidated device data.  Last Pain:  Vitals:   12/15/22 0745  TempSrc: Tympanic  PainSc: 0-No pain         Complications: No notable events documented.

## 2022-12-15 NOTE — Op Note (Signed)
[facility] Patient Name: Bryan [name] Procedure Date: 12/15/2022 MRN: [id number] Attending MD: Docia Chuck. Henrene Pastor , MD, [id number] Date of Birth: Jul 12, 1961 CSN: [id number] Age: 62 Admit Type: Outpatient Procedure:                Colonoscopy with cold snare polypectomy x 1 Indications:              High risk colon cancer surveillance: Personal                            history of non-advanced adenoma (2 tubular adenomas                            2015). Providers:                Docia Chuck. Henrene Pastor, MD, Jaci Carrel, RN, Cherylynn Ridges, Technician, Jefm Miles CRNA Referring MD:             Jacqulyn Bath Paz,MD Medicines:                Monitored Anesthesia Care Complications:            No immediate complications. Estimated blood loss:                            None. Estimated Blood Loss:     Estimated blood loss: none. Procedure:                Pre-Anesthesia Assessment:                           - Prior to the procedure, a History and Physical                            was performed, and patient medications and                            allergies were reviewed. The patient's tolerance of                            previous anesthesia was also reviewed. The risks                            and benefits of the procedure and the sedation                            options and risks were discussed with the patient.                            All questions were answered, and informed consent                            was obtained. Prior Anticoagulants: The patient has  taken no anticoagulant or antiplatelet agents.                            After reviewing the risks and benefits, the patient                            was deemed in satisfactory condition to undergo the                            procedure.                           After obtaining informed consent, the colonoscope                            was passed under  direct vision. Throughout the                            procedure, the patient's blood pressure, pulse, and                            oxygen saturations were monitored continuously. The                            [id number]-[id number] ([id number]) Olympus colonoscope was                            introduced through the anus and advanced to the the                            cecum, identified by appendiceal orifice and                            ileocecal valve. The ileocecal valve, appendiceal                            orifice, and rectum were photographed. NOTE: A                            retroflexed view of the rectum was photographed,                            but did not capture for photodocumentation.                            Excellent view from the anal os was captured. The                            quality of the bowel preparation was excellent. The                            colonoscopy was performed without difficulty. The  patient tolerated the procedure well. The bowel                            preparation used was SUPREP via split dose                            instruction. Scope In: 9:00:30 AM Scope Out: 9:18:18 AM Scope Withdrawal Time: 0 hours 15 minutes 25 seconds  Total Procedure Duration: 0 hours 17 minutes 48 seconds  Findings:      A 4 mm polyp was found in the ascending colon. The polyp was sessile.       The polyp was removed with a cold snare. Resection and retrieval were       complete.      Multiple diverticula were found in the sigmoid colon.      The exam was otherwise without abnormality on direct and retroflexion       views. Impression:               - One 4 mm polyp in the ascending colon, removed                            with a cold snare. Resected and retrieved.                           - Diverticulosis in the sigmoid colon.                           - The examination was otherwise normal on direct                             and retroflexion views. Moderate Sedation:      none Recommendation:           - Repeat colonoscopy in 5 years if polyp                            adenomatous; otherwise, 10 years for surveillance.                           - Patient has a contact number available for                            emergencies. The signs and symptoms of potential                            delayed complications were discussed with the                            patient. Return to normal activities tomorrow.                            Written discharge instructions were provided to the                            patient.                           -  Resume previous diet.                           - Continue present medications.                           - Await pathology results. Procedure Code(s):        --- Professional ---                           [id number], Colonoscopy, flexible; with removal of                            tumor(s), polyp(s), or other lesion(s) by snare                            technique Diagnosis Code(s):        --- Professional ---                           Z86.010, Personal history of colonic polyps                           D12.2, Benign neoplasm of ascending colon                           K57.30, Diverticulosis of large intestine without                            perforation or abscess without bleeding CPT copyright 2022 American Medical Association. All rights reserved. The codes documented in this report are preliminary and upon coder review may  be revised to meet current compliance requirements. Docia Chuck. Henrene Pastor, MD 12/15/2022 9:35:34 AM This report has been signed electronically. Number of Addenda: 0

## 2022-12-17 ENCOUNTER — Encounter (HOSPITAL_COMMUNITY): Payer: Self-pay | Admitting: Internal Medicine

## 2022-12-17 LAB — SURGICAL PATHOLOGY

## 2022-12-21 NOTE — Progress Notes (Signed)
Chief Complaint:   OBESITY Bryan [name] is here to discuss his progress with his obesity treatment plan along with follow-up of his obesity related diagnoses. [name] is on the Category 3 Plan and states he is following his eating plan approximately 70% of the time. [name] states he is walking 30-45 minutes 4 times per week.  Today's visit was #: 62 Starting weight: 7 LBS Starting date: 09/08/2017 Today's weight: 310 LBS Today's date: 11/24/2022 Total lbs lost to date: 16 lbs Total lbs lost since last in-office visit: 6 lbs  Interim History: Last in office visit 09/28/2022.  Patient did well over Thanksgiving and was mindful.  He had lots of Christmas events.  Trying to make high protein and low calorie choices with foods.  Patient has been more consistent with going to the gym.  Subjective:   1. Vitamin D deficiency He is currently taking prescription vitamin D 50,000 IU each week. He denies nausea, vomiting or muscle weakness. Last vitamin D level was 49.8 on 08/13/2022.  Vitamin D level at goal. [name] is tolerating medication(s) well without side effects.  Medication compliance is good as patient endorses taking it as prescribed.  Symptoms are stable and the patient denies additional concerns regarding this condition.    Assessment/Plan:  No orders of the defined types were placed in this encounter.   Medications Discontinued During This Encounter  Medication Reason   Vitamin D, Ergocalciferol, (DRISDOL) 1.25 MG (50000 UNIT) CAPS capsule Reorder   Liraglutide -Weight Management (SAXENDA) 18 MG/3ML SOPN Reorder     Meds ordered this encounter  Medications   DISCONTD: Liraglutide -Weight Management (SAXENDA) 18 MG/3ML SOPN    Sig: Inject 3 mg into the skin daily.    Dispense:  15 mL    Refill:  0   DISCONTD: Vitamin D, Ergocalciferol, (DRISDOL) 1.25 MG (50000 UNIT) CAPS capsule    Sig: Take 1 capsule (50,000 Units total) by mouth every Sunday.    Dispense:  4  capsule    Refill:  0    30 d supply;  ** OV for RF **   Do not send RF request     1. Vitamin D deficiency Low Vitamin D level contributes to fatigue and are associated with obesity, breast, and colon cancer. He agrees to continue to take prescription Vitamin D '@50'$ ,000 IU every week and will follow-up for routine testing of Vitamin D, at least 2-3 times per year to avoid over-replacement.  Refill- Vitamin D, Ergocalciferol, (DRISDOL) 1.25 MG (50000 UNIT) CAPS capsule; Take 1 capsule (50,000 Units total) by mouth every Sunday.  Dispense: 4 capsule; Refill: 0  2. Obesity, Current BMI 43.3 Patient went several weeks without Saxenda due to medication shortage.  No side effects upon restart.  No issues with medication, it is really helping with hunger and cravings.  Holiday eating strategies discussed with patient and a handout has been given today.  Refill- Liraglutide -Weight Management (SAXENDA) 18 MG/3ML SOPN; Inject 3 mg into the skin daily.  Dispense: 15 mL; Refill: 0  Bryan [name] is currently in the action stage of change. As such, his goal is to continue with weight loss efforts. He has agreed to the Category 3 Plan.   Exercise goals:  As is.  Behavioral modification strategies: holiday eating strategies .  Bryan [name] has agreed to follow-up with our clinic in 3 weeks. He was informed of the importance of frequent follow-up visits to maximize his success with intensive lifestyle modifications for his  multiple health conditions.   Objective:   Blood pressure (!) 145/78, pulse 67, temperature 98.1 F (36.7 C), height '5\' 11"'$  (1.803 m), weight (!) 310 lb 12.8 oz (141 kg), SpO2 98 %. Body mass index is 43.35 kg/m.  General: Cooperative, alert, well developed, in no acute distress. HEENT: Conjunctivae and lids unremarkable. Cardiovascular: Regular rhythm.  Lungs: Normal work of breathing. Neurologic: No focal deficits.   Lab Results  Component Value Date   CREATININE 0.92 08/13/2022    BUN 18 08/13/2022   NA 141 08/13/2022   K 4.3 08/13/2022   CL 103 08/13/2022   CO2 20 08/13/2022   Lab Results  Component Value Date   ALT 28 08/13/2022   AST 20 08/13/2022   ALKPHOS 75 08/13/2022   BILITOT 0.4 08/13/2022   Lab Results  Component Value Date   HGBA1C 5.9 (H) 08/13/2022   HGBA1C 5.8 09/15/2021   HGBA1C 6.2 (H) 02/10/2021   HGBA1C 6.3 (H) 09/11/2020   HGBA1C 6.3 (H) 05/29/2020   Lab Results  Component Value Date   INSULIN 9.7 08/13/2022   INSULIN 22.6 02/10/2021   INSULIN 20.9 05/29/2020   INSULIN 24.0 01/10/2020   INSULIN 21.9 11/17/2018   Lab Results  Component Value Date   TSH 1.600 05/25/2021   Lab Results  Component Value Date   CHOL 144 08/13/2022   HDL 44 08/13/2022   LDLCALC 66 08/13/2022   LDLDIRECT 136.0 11/16/2016   TRIG 205 (H) 08/13/2022   CHOLHDL 4 07/07/2019   Lab Results  Component Value Date   VD25OH 49.8 08/13/2022   VD25OH 55.45 09/15/2021   VD25OH 47.1 02/10/2021   Lab Results  Component Value Date   WBC 7.9 12/31/2021   HGB 15.4 12/31/2021   HCT 44.8 12/31/2021   MCV 84 12/31/2021   PLT 301 12/31/2021   No results found for: "IRON", "TIBC", "FERRITIN"  Attestation Statements:   Reviewed by clinician on day of visit: allergies, medications, problem list, medical history, surgical history, family history, social history, and previous encounter notes.  I, Davy Pique, RMA, am acting as Location manager for [employer], DO.   I have reviewed the above documentation for accuracy and completeness, and I agree with the above. Marjory Sneddon, D.O.  The [place] was signed into law in 2016 which includes the topic of electronic health records.  This provides immediate access to information in MyChart.  This includes consultation notes, operative notes, office notes, lab results and pathology reports.  If you have any questions about what you read please let us know at your next visit so we can discuss  your concerns and take corrective action if need be.  We are right here with you.

## 2022-12-23 ENCOUNTER — Ambulatory Visit (INDEPENDENT_AMBULATORY_CARE_PROVIDER_SITE_OTHER): Payer: BC Managed Care – PPO | Admitting: Family Medicine

## 2022-12-23 ENCOUNTER — Other Ambulatory Visit (INDEPENDENT_AMBULATORY_CARE_PROVIDER_SITE_OTHER): Payer: Self-pay | Admitting: Family Medicine

## 2022-12-23 ENCOUNTER — Encounter (INDEPENDENT_AMBULATORY_CARE_PROVIDER_SITE_OTHER): Payer: Self-pay | Admitting: Family Medicine

## 2022-12-23 ENCOUNTER — Other Ambulatory Visit (HOSPITAL_BASED_OUTPATIENT_CLINIC_OR_DEPARTMENT_OTHER): Payer: Self-pay

## 2022-12-23 DIAGNOSIS — F3289 Other specified depressive episodes: Secondary | ICD-10-CM

## 2022-12-23 DIAGNOSIS — I10 Essential (primary) hypertension: Secondary | ICD-10-CM

## 2022-12-23 DIAGNOSIS — E559 Vitamin D deficiency, unspecified: Secondary | ICD-10-CM | POA: Diagnosis not present

## 2022-12-23 DIAGNOSIS — E669 Obesity, unspecified: Secondary | ICD-10-CM

## 2022-12-23 DIAGNOSIS — R7303 Prediabetes: Secondary | ICD-10-CM

## 2022-12-23 DIAGNOSIS — Z6841 Body Mass Index (BMI) 40.0 and over, adult: Secondary | ICD-10-CM

## 2022-12-23 MED ORDER — VITAMIN D (ERGOCALCIFEROL) 1.25 MG (50000 UNIT) PO CAPS: 50000.0000 [IU] | ORAL_CAPSULE | ORAL | 0 refills | Status: DC

## 2022-12-23 MED ORDER — HYDROCHLOROTHIAZIDE 25 MG PO TABS: 25.0000 mg | ORAL_TABLET | Freq: Every day | ORAL | 1 refills | Status: DC

## 2022-12-23 MED ORDER — SAXENDA 18 MG/3ML ~~LOC~~ SOPN: 3.0000 mg | PEN_INJECTOR | Freq: Every day | SUBCUTANEOUS | 0 refills | Status: DC

## 2022-12-23 MED ORDER — METFORMIN HCL 500 MG PO TABS: ORAL_TABLET | ORAL | 0 refills | Status: DC

## 2022-12-23 MED ORDER — LOSARTAN POTASSIUM 50 MG PO TABS: 50.0000 mg | ORAL_TABLET | Freq: Every day | ORAL | 0 refills | Status: DC

## 2022-12-23 MED ORDER — BUPROPION HCL ER (SR) 150 MG PO TB12: ORAL_TABLET | ORAL | 0 refills | Status: DC

## 2022-12-23 MED ORDER — SAXENDA 18 MG/3ML ~~LOC~~ SOPN
3.0000 mg | PEN_INJECTOR | Freq: Every day | SUBCUTANEOUS | 0 refills | Status: DC
  Filled 2022-12-23: qty 15, 30d supply, fill #0

## 2022-12-29 NOTE — Progress Notes (Unsigned)
Chief Complaint:   OBESITY Bryan [name] is here to discuss his progress with his obesity treatment plan along with follow-up of his obesity related diagnoses. Bryan [name] is on {MWMwtlossportion/plan2:23431} and states he is following his eating plan approximately ***% of the time. Bryan [name] states he is *** *** minutes *** times per week.  Today's visit was #: *** Starting weight: *** Starting date: *** Today's weight: *** Today's date: 12/23/2022 Total lbs lost to date: *** Total lbs lost since last in-office visit: ***  Interim History: ***  Subjective:   1. Pre-diabetes ***  2. Essential hypertension ***  3. Vitamin D deficiency ***  4. Emotional Eating Behavior ***  Assessment/Plan:   1. Pre-diabetes *** - metFORMIN (GLUCOPHAGE) 500 MG tablet; TAKE 1 TABLET(500 MG) BY MOUTH THREE TIMES DAILY  Dispense: 90 tablet; Refill: 0  2. Essential hypertension *** - losartan (COZAAR) 50 MG tablet; Take 1 tablet (50 mg total) by mouth daily.  Dispense: 30 tablet; Refill: 0 - hydrochlorothiazide (HYDRODIURIL) 25 MG tablet; Take 1 tablet (25 mg total) by mouth daily.  Dispense: 90 tablet; Refill: 1  3. Vitamin D deficiency *** - Vitamin D, Ergocalciferol, (DRISDOL) 1.25 MG (50000 UNIT) CAPS capsule; Take 1 capsule (50,000 Units total) by mouth every Sunday.  Dispense: 4 capsule; Refill: 0  4. Emotional Eating Behavior *** - buPROPion (WELLBUTRIN SR) 150 MG 12 hr tablet; 1 po BID  Dispense: 60 tablet; Refill: 0  5. Obesity, Current BMI 43.1 [place] will continue Saxenda at 3 mg daily, and we will refill for 1 month.   - Liraglutide -Weight Management (SAXENDA) 18 MG/3ML SOPN; Inject 3 mg into the skin daily.  Dispense: 15 mL; Refill: 0  Bryan [name] is currently in the action stage of change. As such, his goal is to continue with weight loss efforts. He has agreed to the Category 3 Plan.   Exercise goals: As is.   Behavioral modification strategies: increasing lean protein  intake.  Bryan [name] has agreed to follow-up with our clinic in 4 weeks. He was informed of the importance of frequent follow-up visits to maximize his success with intensive lifestyle modifications for his multiple health conditions.   Objective:   Blood pressure 139/81, pulse (!) 102, temperature 98.1 F (36.7 C), height '5\' 11"'$  (1.803 m), weight (!) 309 lb (140.2 kg), SpO2 96 %. Body mass index is 43.1 kg/m.  General: Cooperative, alert, well developed, in no acute distress. HEENT: Conjunctivae and lids unremarkable. Cardiovascular: Regular rhythm.  Lungs: Normal work of breathing. Neurologic: No focal deficits.   Lab Results  Component Value Date   CREATININE 0.92 08/13/2022   BUN 18 08/13/2022   NA 141 08/13/2022   K 4.3 08/13/2022   CL 103 08/13/2022   CO2 20 08/13/2022   Lab Results  Component Value Date   ALT 28 08/13/2022   AST 20 08/13/2022   ALKPHOS 75 08/13/2022   BILITOT 0.4 08/13/2022   Lab Results  Component Value Date   HGBA1C 5.9 (H) 08/13/2022   HGBA1C 5.8 09/15/2021   HGBA1C 6.2 (H) 02/10/2021   HGBA1C 6.3 (H) 09/11/2020   HGBA1C 6.3 (H) 05/29/2020   Lab Results  Component Value Date   INSULIN 9.7 08/13/2022   INSULIN 22.6 02/10/2021   INSULIN 20.9 05/29/2020   INSULIN 24.0 01/10/2020   INSULIN 21.9 11/17/2018   Lab Results  Component Value Date   TSH 1.600 05/25/2021   Lab Results  Component Value Date   CHOL 144 08/13/2022  HDL 44 08/13/2022   LDLCALC 66 08/13/2022   LDLDIRECT 136.0 11/16/2016   TRIG 205 (H) 08/13/2022   CHOLHDL 4 07/07/2019   Lab Results  Component Value Date   VD25OH 49.8 08/13/2022   VD25OH 55.45 09/15/2021   VD25OH 47.1 02/10/2021   Lab Results  Component Value Date   WBC 7.9 12/31/2021   HGB 15.4 12/31/2021   HCT 44.8 12/31/2021   MCV 84 12/31/2021   PLT 301 12/31/2021   No results found for: "IRON", "TIBC", "FERRITIN"  Attestation Statements:   Reviewed by clinician on day of visit: allergies,  medications, problem list, medical history, surgical history, family history, social history, and previous encounter notes.   I, Trixie Dredge, am acting as transcriptionist for Dennard Nip, MD.  I have reviewed the above documentation for accuracy and completeness, and I agree with the above. -  ***

## 2023-01-03 ENCOUNTER — Other Ambulatory Visit: Payer: Self-pay | Admitting: Internal Medicine

## 2023-01-03 DIAGNOSIS — E7849 Other hyperlipidemia: Secondary | ICD-10-CM

## 2023-01-14 ENCOUNTER — Telehealth (INDEPENDENT_AMBULATORY_CARE_PROVIDER_SITE_OTHER): Payer: Self-pay

## 2023-01-14 NOTE — Telephone Encounter (Signed)
(  Key: [id number]) Saxenda '18MG'$ /3ML pen-injectors   Prior auth submitted thru covermymeds.

## 2023-01-21 ENCOUNTER — Ambulatory Visit (INDEPENDENT_AMBULATORY_CARE_PROVIDER_SITE_OTHER): Payer: BC Managed Care – PPO | Admitting: Family Medicine

## 2023-01-21 ENCOUNTER — Other Ambulatory Visit (HOSPITAL_BASED_OUTPATIENT_CLINIC_OR_DEPARTMENT_OTHER): Payer: Self-pay

## 2023-01-21 ENCOUNTER — Encounter (INDEPENDENT_AMBULATORY_CARE_PROVIDER_SITE_OTHER): Payer: Self-pay | Admitting: Family Medicine

## 2023-01-21 ENCOUNTER — Telehealth (INDEPENDENT_AMBULATORY_CARE_PROVIDER_SITE_OTHER): Payer: Self-pay

## 2023-01-21 VITALS — BP 118/80 | HR 77 | Temp 98.2°F | Ht 71.0 in | Wt 311.4 lb

## 2023-01-21 DIAGNOSIS — Z6841 Body Mass Index (BMI) 40.0 and over, adult: Secondary | ICD-10-CM

## 2023-01-21 DIAGNOSIS — R7303 Prediabetes: Secondary | ICD-10-CM | POA: Diagnosis not present

## 2023-01-21 DIAGNOSIS — I10 Essential (primary) hypertension: Secondary | ICD-10-CM | POA: Diagnosis not present

## 2023-01-21 DIAGNOSIS — E559 Vitamin D deficiency, unspecified: Secondary | ICD-10-CM | POA: Diagnosis not present

## 2023-01-21 MED ORDER — SAXENDA 18 MG/3ML ~~LOC~~ SOPN
3.0000 mg | PEN_INJECTOR | Freq: Every day | SUBCUTANEOUS | 0 refills | Status: DC
  Filled 2023-01-21: qty 15, 30d supply, fill #0

## 2023-01-21 MED ORDER — ZEPBOUND 10 MG/0.5ML ~~LOC~~ SOAJ
10.0000 mg | SUBCUTANEOUS | 0 refills | Status: DC
  Filled 2023-01-21: qty 2, 28d supply, fill #0

## 2023-01-21 MED ORDER — VITAMIN D (ERGOCALCIFEROL) 1.25 MG (50000 UNIT) PO CAPS: 50000.0000 [IU] | ORAL_CAPSULE | ORAL | 0 refills | Status: DC

## 2023-01-21 NOTE — Telephone Encounter (Signed)
Prior auth submitted thru covermymeds. (Key: [facility] [employer]) Rx #: [id number] Saxenda 18MG/3ML pen-injectors     Prior auth submitted thru covermymeds. (KeyKathaleen Grinder) Rx #[id number] Zepbound 10MG/0.5ML pen-injectors

## 2023-01-27 NOTE — Telephone Encounter (Signed)
Zepbound approved. Exp 01/21/23-09/21/23

## 2023-02-01 ENCOUNTER — Other Ambulatory Visit (INDEPENDENT_AMBULATORY_CARE_PROVIDER_SITE_OTHER): Payer: Self-pay | Admitting: Family Medicine

## 2023-02-01 DIAGNOSIS — R7303 Prediabetes: Secondary | ICD-10-CM

## 2023-02-03 ENCOUNTER — Other Ambulatory Visit: Payer: Self-pay

## 2023-02-03 MED ORDER — METOPROLOL SUCCINATE ER 25 MG PO TB24: ORAL_TABLET | ORAL | 3 refills | Status: DC

## 2023-02-03 NOTE — Progress Notes (Signed)
Chief Complaint:   OBESITY Bryan [name] is here to discuss his progress with his obesity treatment plan along with follow-up of his obesity related diagnoses. Truth is on the Category 3 Plan with breakfast and lunch options and states he is following his eating plan approximately 70% of the time. Bryan [name] states he is walking on treadmill 30 minutes 2 times per week.  Today's visit was #: 68 Starting weight: 73 LBS Starting date: 09/08/2017 Today's weight: 311 LBS Today's date: 01/21/2023 Total lbs lost to date: 15 LBS Total lbs lost since last in-office visit: +2 LBS  Interim History: Patient states he has been very lethargic lately he has been meal prepping more but has not felt like exercising much.  She still enjoys life dinners, drinks through work engagements.  Patient desires to set some goals today.  Subjective:   1. Essential hypertension Patient is taking Cardizem, HCTZ, Cozaar, Toprol XL.  Patient is asymptomatic with no issues.  2. Prediabetes Patient has good control of hunger and cravings.  No issues with medications.  A1c 5.9, four months ago.  3. Vitamin D deficiency Bryan [name] is tolerating medication(s) well without side effects.  Medication compliance is good as patient endorses taking it as prescribed.  Symptoms are stable and the patient denies additional concerns regarding this condition.  Vitamin D level 49.8, four months ago  Assessment/Plan:  No orders of the defined types were placed in this encounter.   Medications Discontinued During This Encounter  Medication Reason   Vitamin D, Ergocalciferol, (DRISDOL) 1.25 MG (50000 UNIT) CAPS capsule Reorder   Liraglutide -Weight Management (SAXENDA) 18 MG/3ML SOPN Reorder     Meds ordered this encounter  Medications   Vitamin D, Ergocalciferol, (DRISDOL) 1.25 MG (50000 UNIT) CAPS capsule    Sig: Take 1 capsule (50,000 Units total) by mouth every Sunday.    Dispense:  4 capsule    Refill:  0    30 d  supply;  ** OV for RF **   Do not send RF request   Liraglutide -Weight Management (SAXENDA) 18 MG/3ML SOPN    Sig: Inject 3 mg into the skin daily.    Dispense:  15 mL    Refill:  0    Don't fill until pt is due as we are trying to get Zepbound approved thru insurance currently   tirzepatide (ZEPBOUND) 10 MG/0.5ML Pen    Sig: Inject 10 mg into the skin once a week. Q thursday    Dispense:  2 mL    Refill:  0     1. Essential hypertension Blood pressure is at goal, continue meds, PNP and weight loss.  2. Prediabetes We will refill Saxenda 3 mg but we will transition to Zepbound 10 mg weekly.  Patient knows not to take goal medications.  We discussed if approved he will stop the Saxenda and start Zepbound the next day weekly.  Refill- Liraglutide -Weight Management (SAXENDA) 18 MG/3ML SOPN; Inject 3 mg into the skin daily.  Dispense: 15 mL; Refill: 0  Start- tirzepatide (ZEPBOUND) 10 MG/0.5ML Pen; Inject 10 mg into the skin once a week. Q thursday  Dispense: 2 mL; Refill: 0  3. Vitamin D deficiency Continue supplementation and consider recheck in near future.  Refill- Vitamin D, Ergocalciferol, (DRISDOL) 1.25 MG (50000 UNIT) CAPS capsule; Take 1 capsule (50,000 Units total) by mouth every Sunday.  Dispense: 4 capsule; Refill: 0  4. BMI 40.0-44.9, adult (HCC)-current bmi 43.4  5. Morbid obesity (  HCC)-START BMI 46.78 5 days/week 30 to 45 minutes of exercise.  80% following meal plan, decreased ETOH intake.  Refill- Liraglutide -Weight Management (SAXENDA) 18 MG/3ML SOPN; Inject 3 mg into the skin daily.  Dispense: 15 mL; Refill: 0  Start- tirzepatide (ZEPBOUND) 10 MG/0.5ML Pen; Inject 10 mg into the skin once a week. Q thursday  Dispense: 2 mL; Refill: 0  Bryan [name] is currently in the action stage of change. As such, his goal is to continue with weight loss efforts. He has agreed to the Category 3 Plan with breakfast and lunch options.   Exercise goals: For substantial health  benefits, adults should do at least 150 minutes (2 hours and 30 minutes) a week of moderate-intensity, or 75 minutes (1 hour and 15 minutes) a week of vigorous-intensity aerobic physical activity, or an equivalent combination of moderate- and vigorous-intensity aerobic activity. Aerobic activity should be performed in episodes of at least 10 minutes, and preferably, it should be spread throughout the week.  Behavioral modification strategies: increasing lean protein intake, decreasing simple carbohydrates, and planning for success.  Bryan [name] has agreed to follow-up with our clinic in 3 weeks. He was informed of the importance of frequent follow-up visits to maximize his success with intensive lifestyle modifications for his multiple health conditions.   Objective:   Blood pressure 118/80, pulse 77, temperature 98.2 F (36.8 C), height '5\' 11"'$  (1.803 m), weight (!) 311 lb 6.4 oz (141.3 kg), SpO2 97 %. Body mass index is 43.43 kg/m.  General: Cooperative, alert, well developed, in no acute distress. HEENT: Conjunctivae and lids unremarkable. Cardiovascular: Regular rhythm.  Lungs: Normal work of breathing. Neurologic: No focal deficits.   Lab Results  Component Value Date   CREATININE 0.92 08/13/2022   BUN 18 08/13/2022   NA 141 08/13/2022   K 4.3 08/13/2022   CL 103 08/13/2022   CO2 20 08/13/2022   Lab Results  Component Value Date   ALT 28 08/13/2022   AST 20 08/13/2022   ALKPHOS 75 08/13/2022   BILITOT 0.4 08/13/2022   Lab Results  Component Value Date   HGBA1C 5.9 (H) 08/13/2022   HGBA1C 5.8 09/15/2021   HGBA1C 6.2 (H) 02/10/2021   HGBA1C 6.3 (H) 09/11/2020   HGBA1C 6.3 (H) 05/29/2020   Lab Results  Component Value Date   INSULIN 9.7 08/13/2022   INSULIN 22.6 02/10/2021   INSULIN 20.9 05/29/2020   INSULIN 24.0 01/10/2020   INSULIN 21.9 11/17/2018   Lab Results  Component Value Date   TSH 1.600 05/25/2021   Lab Results  Component Value Date   CHOL 144  08/13/2022   HDL 44 08/13/2022   LDLCALC 66 08/13/2022   LDLDIRECT 136.0 11/16/2016   TRIG 205 (H) 08/13/2022   CHOLHDL 4 07/07/2019   Lab Results  Component Value Date   VD25OH 49.8 08/13/2022   VD25OH 55.45 09/15/2021   VD25OH 47.1 02/10/2021   Lab Results  Component Value Date   WBC 7.9 12/31/2021   HGB 15.4 12/31/2021   HCT 44.8 12/31/2021   MCV 84 12/31/2021   PLT 301 12/31/2021   No results found for: "IRON", "TIBC", "FERRITIN"  Attestation Statements:   Reviewed by clinician on day of visit: allergies, medications, problem list, medical history, surgical history, family history, social history, and previous encounter notes.  I, Davy Pique, RMA, am acting as Location manager for [employer], DO.   I have reviewed the above documentation for accuracy and completeness, and I agree with the above. Diego Cory  Promyse Ardito, D.O.  The [place] was signed into law in 2016 which includes the topic of electronic health records.  This provides immediate access to information in MyChart.  This includes consultation notes, operative notes, office notes, lab results and pathology reports.  If you have any questions about what you read please let us know at your next visit so we can discuss your concerns and take corrective action if need be.  We are right here with you.

## 2023-02-15 ENCOUNTER — Other Ambulatory Visit (INDEPENDENT_AMBULATORY_CARE_PROVIDER_SITE_OTHER): Payer: Self-pay | Admitting: Family Medicine

## 2023-02-15 DIAGNOSIS — I10 Essential (primary) hypertension: Secondary | ICD-10-CM

## 2023-02-15 DIAGNOSIS — F3289 Other specified depressive episodes: Secondary | ICD-10-CM

## 2023-02-17 ENCOUNTER — Other Ambulatory Visit (HOSPITAL_BASED_OUTPATIENT_CLINIC_OR_DEPARTMENT_OTHER): Payer: Self-pay

## 2023-02-17 ENCOUNTER — Encounter (INDEPENDENT_AMBULATORY_CARE_PROVIDER_SITE_OTHER): Payer: Self-pay | Admitting: Family Medicine

## 2023-02-17 ENCOUNTER — Other Ambulatory Visit (INDEPENDENT_AMBULATORY_CARE_PROVIDER_SITE_OTHER): Payer: Self-pay | Admitting: Family Medicine

## 2023-02-17 ENCOUNTER — Ambulatory Visit (INDEPENDENT_AMBULATORY_CARE_PROVIDER_SITE_OTHER): Payer: BC Managed Care – PPO | Admitting: Family Medicine

## 2023-02-17 VITALS — BP 127/69 | HR 79 | Temp 98.0°F | Ht 71.0 in | Wt 309.0 lb

## 2023-02-17 DIAGNOSIS — F3289 Other specified depressive episodes: Secondary | ICD-10-CM

## 2023-02-17 DIAGNOSIS — I10 Essential (primary) hypertension: Secondary | ICD-10-CM

## 2023-02-17 DIAGNOSIS — R7303 Prediabetes: Secondary | ICD-10-CM

## 2023-02-17 DIAGNOSIS — E669 Obesity, unspecified: Secondary | ICD-10-CM

## 2023-02-17 DIAGNOSIS — R632 Polyphagia: Secondary | ICD-10-CM

## 2023-02-17 DIAGNOSIS — E559 Vitamin D deficiency, unspecified: Secondary | ICD-10-CM | POA: Diagnosis not present

## 2023-02-17 DIAGNOSIS — Z6841 Body Mass Index (BMI) 40.0 and over, adult: Secondary | ICD-10-CM

## 2023-02-17 MED ORDER — METFORMIN HCL 500 MG PO TABS: ORAL_TABLET | ORAL | 0 refills | Status: DC

## 2023-02-17 MED ORDER — ZEPBOUND 10 MG/0.5ML ~~LOC~~ SOAJ
10.0000 mg | SUBCUTANEOUS | 0 refills | Status: DC
  Filled 2023-02-17: qty 2, 28d supply, fill #0

## 2023-02-17 MED ORDER — VITAMIN D (ERGOCALCIFEROL) 1.25 MG (50000 UNIT) PO CAPS: 50000.0000 [IU] | ORAL_CAPSULE | ORAL | 0 refills | Status: DC

## 2023-02-17 MED ORDER — BUPROPION HCL ER (SR) 150 MG PO TB12: ORAL_TABLET | ORAL | 0 refills | Status: DC

## 2023-02-17 MED ORDER — LOSARTAN POTASSIUM 50 MG PO TABS: 50.0000 mg | ORAL_TABLET | Freq: Every day | ORAL | 0 refills | Status: DC

## 2023-02-17 MED ORDER — HYDROCHLOROTHIAZIDE 25 MG PO TABS: 25.0000 mg | ORAL_TABLET | Freq: Every day | ORAL | 1 refills | Status: DC

## 2023-02-24 NOTE — Progress Notes (Unsigned)
Chief Complaint:   OBESITY Bryan [name] is here to discuss his progress with his obesity treatment plan along with follow-up of his obesity related diagnoses. Bryan [name] is on the Category 3 Plan with breakfast and lunch options and states he is following his eating plan approximately 75% of the time. Bryan [name] states he is on the treadmill for 30 minutes 3 times per week.  Today's visit was #: 11 Starting weight: 326 lbs Starting date: 09/08/2017 Today's weight: 309 lbs Today's date: 02/17/2023 Total lbs lost to date: 17 Total lbs lost since last in-office visit: 2  Interim History: Bryan [name] continues to work on his diet.  He has has some stressors with his wife's health.  He continues to be mindful of his food options.  Subjective:   1. Polyphagia Bryan [name] is doing well on Zepbound, and he notes decreased polyphagia.  He denies nausea or vomiting.  2. Essential hypertension July's blood pressure is controlled on his medications, and with his diet, exercise, and weight loss.  He does not mention any side effects.  3. Vitamin D deficiency Bryan [name] is stable on vitamin D, and he requests a refill today.  4. Pre-diabetes Bryan [name] is doing better with decreasing sugar and starches, and increasing protein.  5. Emotional Eating Behavior Bryan [name]'s mood is stable and he continues to work on decreasing emotional eating behaviors.  His blood pressure is at goal.  Assessment/Plan:   1. Polyphagia Bryan [name] will continue Zepbound, and we will refill for 1 month.  - tirzepatide (ZEPBOUND) 10 MG/0.5ML Pen; Inject 10 mg into the skin once a week every thursday  Dispense: 2 mL; Refill: 0  2. Essential hypertension Bryan [name] will continue his medications, and we will refill hydrochlorothiazide for 90 days and refill losartan for 1 month.  - hydrochlorothiazide (HYDRODIURIL) 25 MG tablet; Take 1 tablet (25 mg total) by mouth daily.  Dispense: 90 tablet; Refill: 1 - losartan (COZAAR) 50 MG tablet;  Take 1 tablet (50 mg total) by mouth daily.  Dispense: 30 tablet; Refill: 0  3. Vitamin D deficiency Bryan [name] will continue prescription vitamin D, and we will refill for 1 month.  - Vitamin D, Ergocalciferol, (DRISDOL) 1.25 MG (50000 UNIT) CAPS capsule; Take 1 capsule (50,000 Units total) by mouth every Sunday.  Dispense: 4 capsule; Refill: 0  4. Pre-diabetes Bryan [name] will continue with his diet, exercise, and metformin.  We will refill metformin for 1 month.  - metFORMIN (GLUCOPHAGE) 500 MG tablet; TAKE 1 TABLET(500 MG) BY MOUTH THREE TIMES DAILY  Dispense: 90 tablet; Refill: 0  5. Emotional Eating Behavior Bryan [name] will continue Wellbutrin SR, and we will refill for 1 month.  - buPROPion (WELLBUTRIN SR) 150 MG 12 hr tablet; 1 po BID  Dispense: 60 tablet; Refill: 0  6. BMI 40.0-44.9, adult (HCC)  7. Obesity, Beginning BMI 46.78 Bryan [name] is currently in the action stage of change. As such, his goal is to continue with weight loss efforts. He has agreed to the Category 3 Plan.   Exercise goals: As is.   Behavioral modification strategies: increasing lean protein intake, meal planning and cooking strategies, and travel eating strategies.  Bryan [name] has agreed to follow-up with our clinic in 4 weeks. He was informed of the importance of frequent follow-up visits to maximize his success with intensive lifestyle modifications for his multiple health conditions.   Objective:   Blood pressure 127/69, pulse 79, temperature 98 F (36.7 C), height 5\' 11"  (1.803 m), weight (!) 309 lb (140.2 kg), SpO2 96 %.  Body mass index is 43.1 kg/m.  Lab Results  Component Value Date   CREATININE 0.92 08/13/2022   BUN 18 08/13/2022   NA 141 08/13/2022   K 4.3 08/13/2022   CL 103 08/13/2022   CO2 20 08/13/2022   Lab Results  Component Value Date   ALT 28 08/13/2022   AST 20 08/13/2022   ALKPHOS 75 08/13/2022   BILITOT 0.4 08/13/2022   Lab Results  Component Value Date   HGBA1C 5.9 (H)  08/13/2022   HGBA1C 5.8 09/15/2021   HGBA1C 6.2 (H) 02/10/2021   HGBA1C 6.3 (H) 09/11/2020   HGBA1C 6.3 (H) 05/29/2020   Lab Results  Component Value Date   INSULIN 9.7 08/13/2022   INSULIN 22.6 02/10/2021   INSULIN 20.9 05/29/2020   INSULIN 24.0 01/10/2020   INSULIN 21.9 11/17/2018   Lab Results  Component Value Date   TSH 1.600 05/25/2021   Lab Results  Component Value Date   CHOL 144 08/13/2022   HDL 44 08/13/2022   LDLCALC 66 08/13/2022   LDLDIRECT 136.0 11/16/2016   TRIG 205 (H) 08/13/2022   CHOLHDL 4 07/07/2019   Lab Results  Component Value Date   VD25OH 49.8 08/13/2022   VD25OH 55.45 09/15/2021   VD25OH 47.1 02/10/2021   Lab Results  Component Value Date   WBC 7.9 12/31/2021   HGB 15.4 12/31/2021   HCT 44.8 12/31/2021   MCV 84 12/31/2021   PLT 301 12/31/2021   No results found for: "IRON", "TIBC", "FERRITIN"  Attestation Statements:   Reviewed by clinician on day of visit: allergies, medications, problem list, medical history, surgical history, family history, social history, and previous encounter notes.   I, Trixie Dredge, am acting as transcriptionist for Dennard Nip, MD.  I have reviewed the above documentation for accuracy and completeness, and I agree with the above. -  Dennard Nip, MD

## 2023-03-11 ENCOUNTER — Other Ambulatory Visit (INDEPENDENT_AMBULATORY_CARE_PROVIDER_SITE_OTHER): Payer: Self-pay | Admitting: Family Medicine

## 2023-03-11 DIAGNOSIS — R7303 Prediabetes: Secondary | ICD-10-CM

## 2023-03-11 DIAGNOSIS — I10 Essential (primary) hypertension: Secondary | ICD-10-CM

## 2023-03-18 ENCOUNTER — Ambulatory Visit (INDEPENDENT_AMBULATORY_CARE_PROVIDER_SITE_OTHER): Payer: BC Managed Care – PPO | Admitting: Family Medicine

## 2023-03-18 ENCOUNTER — Encounter (INDEPENDENT_AMBULATORY_CARE_PROVIDER_SITE_OTHER): Payer: Self-pay | Admitting: Family Medicine

## 2023-03-18 ENCOUNTER — Other Ambulatory Visit (INDEPENDENT_AMBULATORY_CARE_PROVIDER_SITE_OTHER): Payer: Self-pay | Admitting: Family Medicine

## 2023-03-18 ENCOUNTER — Other Ambulatory Visit (HOSPITAL_BASED_OUTPATIENT_CLINIC_OR_DEPARTMENT_OTHER): Payer: Self-pay

## 2023-03-18 VITALS — BP 123/72 | HR 74 | Temp 98.3°F | Ht 71.0 in | Wt 309.0 lb

## 2023-03-18 DIAGNOSIS — E669 Obesity, unspecified: Secondary | ICD-10-CM

## 2023-03-18 DIAGNOSIS — R7303 Prediabetes: Secondary | ICD-10-CM

## 2023-03-18 DIAGNOSIS — I10 Essential (primary) hypertension: Secondary | ICD-10-CM | POA: Diagnosis not present

## 2023-03-18 DIAGNOSIS — R632 Polyphagia: Secondary | ICD-10-CM

## 2023-03-18 DIAGNOSIS — F3289 Other specified depressive episodes: Secondary | ICD-10-CM

## 2023-03-18 DIAGNOSIS — Z6841 Body Mass Index (BMI) 40.0 and over, adult: Secondary | ICD-10-CM

## 2023-03-18 DIAGNOSIS — E559 Vitamin D deficiency, unspecified: Secondary | ICD-10-CM | POA: Diagnosis not present

## 2023-03-18 MED ORDER — BUPROPION HCL ER (SR) 150 MG PO TB12: ORAL_TABLET | ORAL | 0 refills | Status: DC

## 2023-03-18 MED ORDER — LOSARTAN POTASSIUM 50 MG PO TABS: 50.0000 mg | ORAL_TABLET | Freq: Every day | ORAL | 0 refills | Status: DC

## 2023-03-18 MED ORDER — ZEPBOUND 12.5 MG/0.5ML ~~LOC~~ SOAJ
12.5000 mg | SUBCUTANEOUS | 0 refills | Status: DC
  Filled 2023-03-18: qty 2, 28d supply, fill #0

## 2023-03-18 MED ORDER — METFORMIN HCL 500 MG PO TABS: ORAL_TABLET | ORAL | 0 refills | Status: DC

## 2023-03-18 MED ORDER — VITAMIN D (ERGOCALCIFEROL) 1.25 MG (50000 UNIT) PO CAPS: 50000.0000 [IU] | ORAL_CAPSULE | ORAL | 0 refills | Status: DC

## 2023-03-18 NOTE — Progress Notes (Signed)
Carlye Grippe, D.O.  ABFM, ABOM Specializing in Clinical Bariatric Medicine  Office located at: [address] [place], [place]  [address]     Assessment and Plan:   No orders of the defined types were placed in this encounter.   Medications Discontinued During This Encounter  Medication Reason   tirzepatide (ZEPBOUND) 10 MG/0.5ML Pen    buPROPion (WELLBUTRIN SR) 150 MG 12 hr tablet Reorder   metFORMIN (GLUCOPHAGE) 500 MG tablet Reorder   losartan (COZAAR) 50 MG tablet Reorder   Vitamin D, Ergocalciferol, (DRISDOL) 1.25 MG (50000 UNIT) CAPS capsule Reorder     Meds ordered this encounter  Medications   buPROPion (WELLBUTRIN SR) 150 MG 12 hr tablet    Sig: 1 po BID    Dispense:  60 tablet    Refill:  0    30 d supply;  ** OV for RF **   Do not send RF request   Vitamin D, Ergocalciferol, (DRISDOL) 1.25 MG (50000 UNIT) CAPS capsule    Sig: Take 1 capsule (50,000 Units total) by mouth every Sunday.    Dispense:  4 capsule    Refill:  0    30 d supply;  ** OV for RF **   Do not send RF request   losartan (COZAAR) 50 MG tablet    Sig: Take 1 tablet (50 mg total) by mouth daily.    Dispense:  30 tablet    Refill:  0    30 d supply;  ** OV for RF **   Do not send RF request   metFORMIN (GLUCOPHAGE) 500 MG tablet    Sig: TAKE 1 TABLET(500 MG) BY MOUTH THREE TIMES DAILY    Dispense:  90 tablet    Refill:  0   tirzepatide (ZEPBOUND) 12.5 MG/0.5ML Pen    Sig: Inject 12.5 mg into the skin once a week.    Dispense:  2 mL    Refill:  0    Polyphagia Assessment: Condition is stable. Patient endorses that his hunger has been better controlled on the Zepbound. He has been compliant with Zepbound 10 mg weekly. Denies any side effects, expect on one occasion when he vomited after eating a tenderloin biscuit.   Plan: Increase Zepbound to 12.5 mg weekly as per patient request.  - Discussed the risks of increased Zepbound when eating off plan.   Continue his prudent  nutritional plan that is low in simple carbohydrates, saturated fats and trans fats to goal of 5-10% weight loss to achieve significant health benefits.  Pt encouraged to continually advance exercise and cardiovascular fitness as tolerated throughout weight loss journey.    Essential hypertension Assessment: Condition is stable. Last 3 blood pressure readings in our office are as follows: BP Readings from Last 3 Encounters:  03/18/23 123/72  02/17/23 127/69  01/21/23 118/80  He has been compliant with Losartan 50 mg daily and Hydrodiuril 25 mg daily. No concerns. Asymptomatic.  Plan:Will refill Losartan today. Continue with meds.  - Avoid buying foods that are: processed, frozen, or prepackaged to avoid excess salt. - We will continue to monitor symptoms as they relate to the his weight loss journey.    Vitamin D deficiency Assessment: Condition is stable. Lab Results  Component Value Date   VD25OH 49.8 08/13/2022   VD25OH 55.45 09/15/2021   VD25OH 47.1 02/10/2021  Patient has been more compliant with Ergocalciferol 50K IU weekly. Denies any side effects.  Plan: Will refill this today. Continue  with med.  - weight loss will likely improve availability of vitamin D, thus encouraged Sherwood to continue with meal plan and their weight loss efforts to further improve this condition.  Thus, we will need to monitor levels regularly (every 3-4 mo on average) to keep levels within normal limits and prevent over supplementation.   Pre-diabetes Assessment: Condition is Not optimized. Lab Results  Component Value Date   HGBA1C 5.9 (H) 08/13/2022   HGBA1C 5.8 09/15/2021   HGBA1C 6.2 (H) 02/10/2021   INSULIN 9.7 08/13/2022   INSULIN 22.6 02/10/2021   INSULIN 20.9 05/29/2020  He reports good compliance with Metformin 500 mg TID. Denies any side effects.  Plan: Will refill this today.  Continue with med.   - Continue to decrease simple carbs/ sugars; increase fiber and proteins -> follow  his meal plan.    - Travas will continue to work on weight loss, exercise, via their meal plan we devised to help decrease the risk of progressing to diabetes.    Emotional Eating Behavior Assessment: Condition is stable Denies any SI/HI. Mood is stable. Cravings and hunger are better controlled. Patient is compliant with Wellbutrin SR 150 mg 1 po BID.  Plan: Will refill this today. Continue with med. Continue his prudent nutritional plan that is low in simple carbohydrates, saturated fats and trans fats to goal of 5-10% weight loss to achieve significant health benefits.  Pt encouraged to continually advance exercise and cardiovascular fitness as tolerated throughout weight loss journey.   TREATMENT PLAN FOR OBESITY: BMI 40.0-44.9, adult-with current bmi 43.1 Obesity, Beginning BMI 46.78/Date 09/08/2017 Assessment: Condition is not optimized. Biometric data collected today, was reviewed with patient.  Fat mass has decreased by 2.6lb. Muscle mass has increased by 1.6lb. Total body water has decreased by 1.4lb.   Plan:  Valin is currently in the action stage of change. As such, his goal is to continue weight management plan. Adyan will work on healthier eating habits and try their best to  Continue the Category 3 meal plan with breakfast and lunch options.  Behavioral Intervention Additional resources provided today: patient declined Evidence-based interventions for health behavior change were utilized today including the discussion of self monitoring techniques, problem-solving barriers and SMART goal setting techniques.   Regarding patient's less desirable eating habits and patterns, we employed the technique of small changes.  Pt will specifically work on: increase walking to 5 days a week. for next visit.    FOLLOW UP: Return in about 4 weeks (around 04/15/2023). He was informed of the importance of frequent follow up visits to maximize his success with intensive lifestyle  modifications for his multiple health conditions.   Subjective:   Chief complaint: Obesity Braydan is here to discuss his progress with his obesity treatment plan. He is on the the Category 3 Plan with breakfast and lunch options and states he is following his eating plan approximately 75% of the time. He states he is walking 30-45 minutes 3 days per week.  Interval History:  DAVIONTE LUSBY is here for a follow up office visit. Since last office visit he has had a lot of stress and very little time for taking care of himself. Thus, he has not been following the meal plan and exercising as much as he would have liked to. He endorses that he has also not been getting restful sleep because of the stress. We reviewed his meal plan and all questions were answered.   Pharmacotherapy for weight loss: He is  currently taking  Metformin and Zepbound  for medical weight loss.  Denies side effects.    Review of Systems:  Pertinent positives were addressed with patient today.   Weight Summary and Biometrics   Weight Lost Since Last Visit: 0  Weight Gained Since Last Visit: 0    Vitals Temp: 98.3 F (36.8 C) BP: 123/72 Pulse Rate: 74 SpO2: 97 %   Anthropometric Measurements Height: 5\' 11"  (1.803 m) Weight: (!) 309 lb (140.2 kg) BMI (Calculated): 43.12 Weight at Last Visit: 309lb Weight Lost Since Last Visit: 0 Weight Gained Since Last Visit: 0 Starting Weight: 326lb Total Weight Loss (lbs): 17 lb (7.711 kg) Peak Weight: 330lb   Body Composition  Body Fat %: 39.3 % Fat Mass (lbs): 121.4 lbs Muscle Mass (lbs): 178.4 lbs Total Body Water (lbs): 132.8 lbs Visceral Fat Rating : 26   Other Clinical Data Fasting: no Labs: no Today's Visit #: 85 Starting Date: 09/08/17     Objective:   PHYSICAL EXAM: Blood pressure 123/72, pulse 74, temperature 98.3 F (36.8 C), height 5\' 11"  (1.803 m), weight (!) 309 lb (140.2 kg), SpO2 97 %. Body mass index is 43.1 kg/m.  General:  Well Developed, well nourished, and in no acute distress.  HEENT: Normocephalic, atraumatic Skin: Warm and dry, cap RF less 2 sec, good turgor Chest:  Normal excursion, shape, no gross abn Respiratory: speaking in full sentences, no conversational dyspnea NeuroM-Sk: Ambulates w/o assistance, moves * 4 Psych: A and O *3, insight good, mood-full  DIAGNOSTIC DATA REVIEWED:  BMET    Component Value Date/Time   NA 141 08/13/2022 1302   K 4.3 08/13/2022 1302   CL 103 08/13/2022 1302   CO2 20 08/13/2022 1302   GLUCOSE 103 (H) 08/13/2022 1302   GLUCOSE 135 (H) 09/06/2021 1035   BUN 18 08/13/2022 1302   CREATININE 0.92 08/13/2022 1302   CALCIUM 9.2 08/13/2022 1302   GFRNONAA >60 09/06/2021 1035   GFRAA 96 05/29/2020 1417   Lab Results  Component Value Date   HGBA1C 5.9 (H) 08/13/2022   HGBA1C 5.9 08/15/2013   Lab Results  Component Value Date   INSULIN 9.7 08/13/2022   INSULIN 21.9 09/08/2017   Lab Results  Component Value Date   TSH 1.600 05/25/2021   CBC    Component Value Date/Time   WBC 7.9 12/31/2021 0809   WBC 11.8 (H) 09/06/2021 1035   RBC 5.33 12/31/2021 0809   RBC 5.94 (H) 09/06/2021 1035   HGB 15.4 12/31/2021 0809   HCT 44.8 12/31/2021 0809   PLT 301 12/31/2021 0809   MCV 84 12/31/2021 0809   MCH 28.9 12/31/2021 0809   MCH 28.5 09/06/2021 1035   MCHC 34.4 12/31/2021 0809   MCHC 33.9 09/06/2021 1035   RDW 13.3 12/31/2021 0809   Iron Studies No results found for: "IRON", "TIBC", "FERRITIN", "IRONPCTSAT" Lipid Panel     Component Value Date/Time   CHOL 144 08/13/2022 1302   TRIG 205 (H) 08/13/2022 1302   HDL 44 08/13/2022 1302   CHOLHDL 4 07/07/2019 0835   VLDL 38.2 07/07/2019 0835   LDLCALC 66 08/13/2022 1302   LDLDIRECT 136.0 11/16/2016 0902   Hepatic Function Panel     Component Value Date/Time   PROT 6.6 08/13/2022 1302   ALBUMIN 4.2 08/13/2022 1302   AST 20 08/13/2022 1302   ALT 28 08/13/2022 1302   ALKPHOS 75 08/13/2022 1302   BILITOT  0.4 08/13/2022 1302   BILIDIR 0.0 02/26/2012 1353  Component Value Date/Time   TSH 1.600 05/25/2021 1812   TSH 1.560 01/10/2020 1042   Nutritional Lab Results  Component Value Date   VD25OH 49.8 08/13/2022   VD25OH 55.45 09/15/2021   VD25OH 47.1 02/10/2021    Attestations:   Reviewed by clinician on day of visit: allergies, medications, problem list, medical history, surgical history, family history, social history, and previous encounter notes.   I,Special Puri,acting as a Neurosurgeonscribe for Marsh & McLennanDeborah Breeley Bischof, DO.,have documented all relevant documentation on the behalf of Thomasene LotDeborah Brittany Osier, DO,as directed by  Thomasene Loteborah Raegen Tarpley, DO while in the presence of Thomasene Loteborah Jafet Wissing, DO.   I, Thomasene Loteborah Joell Usman, DO, have reviewed all documentation for this visit. The documentation on 03/18/23 for the exam, diagnosis, procedures, and orders are all accurate and complete.

## 2023-03-19 ENCOUNTER — Other Ambulatory Visit (HOSPITAL_BASED_OUTPATIENT_CLINIC_OR_DEPARTMENT_OTHER): Payer: Self-pay

## 2023-03-22 ENCOUNTER — Encounter (INDEPENDENT_AMBULATORY_CARE_PROVIDER_SITE_OTHER): Payer: Self-pay | Admitting: Family Medicine

## 2023-03-25 ENCOUNTER — Other Ambulatory Visit (HOSPITAL_BASED_OUTPATIENT_CLINIC_OR_DEPARTMENT_OTHER): Payer: Self-pay

## 2023-03-29 ENCOUNTER — Other Ambulatory Visit (HOSPITAL_BASED_OUTPATIENT_CLINIC_OR_DEPARTMENT_OTHER): Payer: Self-pay

## 2023-04-07 ENCOUNTER — Ambulatory Visit (INDEPENDENT_AMBULATORY_CARE_PROVIDER_SITE_OTHER): Payer: BC Managed Care – PPO | Admitting: Family Medicine

## 2023-04-10 ENCOUNTER — Other Ambulatory Visit (INDEPENDENT_AMBULATORY_CARE_PROVIDER_SITE_OTHER): Payer: Self-pay | Admitting: Family Medicine

## 2023-04-10 DIAGNOSIS — I10 Essential (primary) hypertension: Secondary | ICD-10-CM

## 2023-04-10 DIAGNOSIS — F3289 Other specified depressive episodes: Secondary | ICD-10-CM

## 2023-04-10 DIAGNOSIS — R7303 Prediabetes: Secondary | ICD-10-CM

## 2023-04-15 ENCOUNTER — Ambulatory Visit (INDEPENDENT_AMBULATORY_CARE_PROVIDER_SITE_OTHER): Payer: BC Managed Care – PPO | Admitting: Family Medicine

## 2023-04-22 ENCOUNTER — Ambulatory Visit (INDEPENDENT_AMBULATORY_CARE_PROVIDER_SITE_OTHER): Payer: BC Managed Care – PPO | Admitting: Family Medicine

## 2023-04-22 ENCOUNTER — Other Ambulatory Visit (INDEPENDENT_AMBULATORY_CARE_PROVIDER_SITE_OTHER): Payer: Self-pay | Admitting: Family Medicine

## 2023-04-22 ENCOUNTER — Encounter (INDEPENDENT_AMBULATORY_CARE_PROVIDER_SITE_OTHER): Payer: Self-pay | Admitting: Family Medicine

## 2023-04-22 VITALS — BP 148/72 | HR 78 | Temp 98.2°F | Ht 71.0 in | Wt 315.0 lb

## 2023-04-22 DIAGNOSIS — Z6841 Body Mass Index (BMI) 40.0 and over, adult: Secondary | ICD-10-CM

## 2023-04-22 DIAGNOSIS — R7303 Prediabetes: Secondary | ICD-10-CM

## 2023-04-22 DIAGNOSIS — E669 Obesity, unspecified: Secondary | ICD-10-CM

## 2023-04-22 DIAGNOSIS — R632 Polyphagia: Secondary | ICD-10-CM

## 2023-04-22 DIAGNOSIS — I10 Essential (primary) hypertension: Secondary | ICD-10-CM

## 2023-04-22 DIAGNOSIS — E559 Vitamin D deficiency, unspecified: Secondary | ICD-10-CM | POA: Diagnosis not present

## 2023-04-22 DIAGNOSIS — F3289 Other specified depressive episodes: Secondary | ICD-10-CM

## 2023-04-22 DIAGNOSIS — R4689 Other symptoms and signs involving appearance and behavior: Secondary | ICD-10-CM

## 2023-04-22 MED ORDER — VITAMIN D (ERGOCALCIFEROL) 1.25 MG (50000 UNIT) PO CAPS
50000.0000 [IU] | ORAL_CAPSULE | ORAL | 0 refills | Status: DC
Start: 2023-04-25 — End: 2023-05-20

## 2023-04-22 MED ORDER — LOSARTAN POTASSIUM 50 MG PO TABS: 50.0000 mg | ORAL_TABLET | Freq: Every day | ORAL | 0 refills | Status: DC

## 2023-04-22 MED ORDER — METFORMIN HCL 500 MG PO TABS: ORAL_TABLET | ORAL | 0 refills | Status: DC

## 2023-04-22 MED ORDER — SAXENDA 18 MG/3ML ~~LOC~~ SOPN: PEN_INJECTOR | SUBCUTANEOUS | 0 refills | Status: DC

## 2023-04-22 MED ORDER — BUPROPION HCL ER (SR) 150 MG PO TB12: ORAL_TABLET | ORAL | 0 refills | Status: DC

## 2023-04-22 NOTE — Progress Notes (Signed)
Carlye Grippe, D.O.  ABFM, ABOM Specializing in Clinical Bariatric Medicine  Office located at: [address] [place], [place]  [address]     Assessment and Plan:   Medications Discontinued During This Encounter  Medication Reason   tirzepatide (ZEPBOUND) 12.5 MG/0.5ML Pen Change in therapy   buPROPion (WELLBUTRIN SR) 150 MG 12 hr tablet Reorder   Vitamin D, Ergocalciferol, (DRISDOL) 1.25 MG (50000 UNIT) CAPS capsule Reorder   losartan (COZAAR) 50 MG tablet Reorder   metFORMIN (GLUCOPHAGE) 500 MG tablet Reorder     Meds ordered this encounter  Medications   losartan (COZAAR) 50 MG tablet    Sig: Take 1 tablet (50 mg total) by mouth daily.    Dispense:  30 tablet    Refill:  0    30 d supply;  ** OV for RF **   Do not send RF request   Vitamin D, Ergocalciferol, (DRISDOL) 1.25 MG (50000 UNIT) CAPS capsule    Sig: Take 1 capsule (50,000 Units total) by mouth every Sunday.    Dispense:  4 capsule    Refill:  0    30 d supply;  ** OV for RF **   Do not send RF request   buPROPion (WELLBUTRIN SR) 150 MG 12 hr tablet    Sig: 1 po BID    Dispense:  60 tablet    Refill:  0    30 d supply;  ** OV for RF **   Do not send RF request   metFORMIN (GLUCOPHAGE) 500 MG tablet    Sig: TAKE 1 TABLET(500 MG) BY MOUTH THREE TIMES DAILY    Dispense:  90 tablet    Refill:  0   Liraglutide -Weight Management (SAXENDA) 18 MG/3ML SOPN    Sig: 0.6 mg injected subcutaneously daily for 1 week, then increase by 1.2mg  weekly until reaching 3 mg injected subcutaneous daily    Dispense:  18 mL    Refill:  0    Patient will have discount coupon, please include novofine needles QS      Essential hypertension Assessment: Condition is elevated and not optimized.  Last 3 blood pressure readings in our office are as follows: BP Readings from Last 3 Encounters:  04/22/23 (!) 148/72  03/18/23 123/72  02/17/23 127/69  - His  blood pressure is elevated today, which is likely due to eating  more off plan foods + stress. Today he is asymptomatic, no concerns.  - At home his blood pressure is typically in the 120s/70s or so. He does not check his bp regularly at home.  - No issues with Losartan 50 mg daily, Hydrodiuril 25 mg daily, and Cardizem 30 mg prn. Denies any side effects.  Plan: - Continue with all antihypertensive medications. Will refill this today.   -  Unless pre-existing renal or cardiopulmonary conditions exist which patient was told to limit their fluid intake by another provider, I recommended roughly one half of their weight in pounds, to be the approximate ounces of non-caloric, non-caffeinated beverages they should drink per day; including more if they are engaging in exercise.  - Avoid buying foods that are: processed, frozen, or prepackaged to avoid excess salt. - Ambulatory blood pressure monitoring encouraged.  Reminded patient that if they ever feel poorly in any way, to check their blood pressure and pulse as well. - We will continue to monitor closely alongside PCP/ specialists.  Pt reminded to also f/up with those individuals as instructed by  them.  - We will continue to monitor symptoms as they relate to the his weight loss journey.     Pre-diabetes Assessment: Condition is not optimized.  Lab Results  Component Value Date   HGBA1C 5.9 (H) 08/13/2022   HGBA1C 5.8 09/15/2021   HGBA1C 6.2 (H) 02/10/2021   INSULIN 9.7 08/13/2022   INSULIN 22.6 02/10/2021   INSULIN 20.9 05/29/2020  - He has been compliant and tolerant with Metformin 500 mg TID. Denies any side effects. - He has noticed increased cravings since being off the Zepbound.   Plan: - Continue with med. Will refill this today.    - Keylor will continue to work on weight loss, exercise, via their meal plan we devised to help decrease the risk of progressing to diabetes.     Polyphagia Assessment: Condition is not optimized.  - His Zepbound went from $153 to $1100 and is not interested in  paying for the medication anymore.  - The last time he took Zepbound was approximately 1 month ago.  - He notices that his cravings have increased since being off the Zepbound.   Plan: - Discontinue Zepbound due to cost of medication. Restart Saxenda. Risks/benefits of restarting the medication were discussed with Prince. All questions were answered appropriately.  - Continue his prudent nutritional plan that is low in simple carbohydrates, saturated fats and trans fats to goal of 5-10% weight loss to achieve significant health benefits.  Pt encouraged to continually advance exercise and cardiovascular fitness as tolerated throughout weight loss journey.  - Will continue to monitor symptoms.     Vitamin D deficiency Assessment: Condition is essentially at goal.  Lab Results  Component Value Date   VD25OH 49.8 08/13/2022   VD25OH 55.45 09/15/2021   VD25OH 47.1 02/10/2021  - No issues with Ergocalciferol 50K IU weekly. Denies any side effects.  Plan: - Continue with med as prescribed. Will refill this today.   - Will continue to  monitor levels regularly (every 3-4 mo on average) to keep levels within normal limits and prevent over supplementation.    Emotional Eating Behavior Assessment: Condition is Not optimized.. - Denies any SI/HI. Mood is stable. - Endorses that he has still been experiencing stressors from life.  - His cravings have increased since being off the Zepbound  - No issues with Wellbutrin SR 150 mg 1 po BID. Denies any side   Plan:  - Continue with med.  Will refill this today.   - Behavior modification techniques were discussed today to help deal with stress management, such as exercise and meditation/prayer.  - We will continue to monitor closely alongside PCP / other specialists.    TREATMENT PLAN FOR OBESITY: BMI 40.0-44.9, adult-with current bmi 43.95 Obesity, Beginning BMI 46.78/Date 09/08/2017 Assessment:  Bryan [name] is here to discuss his  progress with his obesity treatment plan along with follow-up of his obesity related diagnoses. See Medical Weight Management Flowsheet for complete bioelectrical impedance results.  Condition is not optimized. Biometric data collected today, was reviewed with patient.   Since last office visit on 03/18/23 patient's Fat mass has increased by 5.8 lb. Muscle mass has increased by 1 lb. Total body water has increased by 2lb.  Counseling done on how various foods will affect these numbers and how to maximize success  Total lbs lost to date: 11 Total weight loss percentage to date: 3.37 ( use https://www.[employer].si )  Plan:  Sophie is currently in the action stage of change. As such,  his goal is to continue his weight management plan. Marquavius will work on [employer] habits and continue with Category 3 meal plan with breakfast and lunch options.   Behavioral Intervention Additional resources provided today: patient declined Evidence-based interventions for health behavior change were utilized today including the discussion of self monitoring techniques, problem-solving barriers and SMART goal setting techniques.   Regarding patient's less desirable eating habits and patterns, we employed the technique of small changes.  Pt will specifically work on: continue adherence to prudent nutritional plan  for next visit.    Recommended Physical Activity Goals  Leeland has been advised to slowly work up to 150 minutes of moderate intensity aerobic activity a week and strengthening exercises 2-3 times per week for cardiovascular health, weight loss maintenance and preservation of muscle mass.   He has agreed to Continue current level of physical activity   FOLLOW UP: Return in about 3 weeks (around 05/13/2023). He was informed of the importance of frequent follow up visits to maximize his success with intensive lifestyle modifications for his multiple health  conditions.  Subjective:   Chief complaint: Obesity Bryan [name] is here to discuss his progress with his obesity treatment plan. He is on the the Category 3 Plan with breakfast and lunch options and states he is following his eating plan approximately 65% of the time. He states he is exercising (walking) 30-45 minutes 3 days per week.  Interval History:  KALU SHAMI is here for a follow up office visit.   Since last office visit he states: - He has been stressed from his wife's health related issues.  - His Zepbound went from $153 to to $1100 and is not interested in paying for the medication anymore.  - The last time he took Zepbound was approximately 1 month ago.  - He notices that his cravings have increased since being off the Zepbound.   We reviewed his meal plan and all questions were answered. Patient's food recall appears to be accurate and consistent with what is on plan when he is following it. When eating on plan, his hunger and cravings are well controlled.      Pharmacotherapy for weight loss: He is currently taking  Metformin and Zepbound  for medical weight loss.  Denies side effects.    Review of Systems:  Pertinent positives were addressed with patient today.  Weight Summary and Biometrics   Weight Lost Since Last Visit: 0  Weight Gained Since Last Visit: 6 lb    Vitals Temp: 98.2 F (36.8 C) BP: (!) 148/72 Pulse Rate: 78 SpO2: 96 %   Anthropometric Measurements Height: 5\' 11"  (1.803 m) Weight: (!) 315 lb (142.9 kg) BMI (Calculated): 43.95 Weight at Last Visit: 309 lb Weight Lost Since Last Visit: 0 Weight Gained Since Last Visit: 6 lb Starting Weight: 326 lb Total Weight Loss (lbs): 11 lb (4.99 kg) Peak Weight: 330 lb   Body Composition  Body Fat %: 40.3 % Fat Mass (lbs): 127.2 lbs Muscle Mass (lbs): 179.4 lbs Total Body Water (lbs): 134.8 lbs Visceral Fat Rating : 27   Other Clinical Data Fasting: No Labs: No Today's Visit #:  86 Starting Date: 09/08/17   Objective:   PHYSICAL EXAM: Blood pressure (!) 148/72, pulse 78, temperature 98.2 F (36.8 C), height 5\' 11"  (1.803 m), weight (!) 315 lb (142.9 kg), SpO2 96 %. Body mass index is 43.93 kg/m.  General: Well Developed, well nourished, and in no acute distress.  HEENT: Normocephalic, atraumatic Skin:  Warm and dry, cap RF less 2 sec, good turgor Chest:  Normal excursion, shape, no gross abn Respiratory: speaking in full sentences, no conversational dyspnea NeuroM-Sk: Ambulates w/o assistance, moves * 4 Psych: A and O *3, insight good, mood-full  DIAGNOSTIC DATA REVIEWED:  BMET    Component Value Date/Time   NA 141 08/13/2022 1302   K 4.3 08/13/2022 1302   CL 103 08/13/2022 1302   CO2 20 08/13/2022 1302   GLUCOSE 103 (H) 08/13/2022 1302   GLUCOSE 135 (H) 09/06/2021 1035   BUN 18 08/13/2022 1302   CREATININE 0.92 08/13/2022 1302   CALCIUM 9.2 08/13/2022 1302   GFRNONAA >60 09/06/2021 1035   GFRAA 96 05/29/2020 1417   Lab Results  Component Value Date   HGBA1C 5.9 (H) 08/13/2022   HGBA1C 5.9 08/15/2013   Lab Results  Component Value Date   INSULIN 9.7 08/13/2022   INSULIN 21.9 09/08/2017   Lab Results  Component Value Date   TSH 1.600 05/25/2021   CBC    Component Value Date/Time   WBC 7.9 12/31/2021 0809   WBC 11.8 (H) 09/06/2021 1035   RBC 5.33 12/31/2021 0809   RBC 5.94 (H) 09/06/2021 1035   HGB 15.4 12/31/2021 0809   HCT 44.8 12/31/2021 0809   PLT 301 12/31/2021 0809   MCV 84 12/31/2021 0809   MCH 28.9 12/31/2021 0809   MCH 28.5 09/06/2021 1035   MCHC 34.4 12/31/2021 0809   MCHC 33.9 09/06/2021 1035   RDW 13.3 12/31/2021 0809   Iron Studies No results found for: "IRON", "TIBC", "FERRITIN", "IRONPCTSAT" Lipid Panel     Component Value Date/Time   CHOL 144 08/13/2022 1302   TRIG 205 (H) 08/13/2022 1302   HDL 44 08/13/2022 1302   CHOLHDL 4 07/07/2019 0835   VLDL 38.2 07/07/2019 0835   LDLCALC 66 08/13/2022 1302    LDLDIRECT 136.0 11/16/2016 0902   Hepatic Function Panel     Component Value Date/Time   PROT 6.6 08/13/2022 1302   ALBUMIN 4.2 08/13/2022 1302   AST 20 08/13/2022 1302   ALT 28 08/13/2022 1302   ALKPHOS 75 08/13/2022 1302   BILITOT 0.4 08/13/2022 1302   BILIDIR 0.0 02/26/2012 1353      Component Value Date/Time   TSH 1.600 05/25/2021 1812   TSH 1.560 01/10/2020 1042   Nutritional Lab Results  Component Value Date   VD25OH 49.8 08/13/2022   VD25OH 55.45 09/15/2021   VD25OH 47.1 02/10/2021    Attestations:   Reviewed by clinician on day of visit: allergies, medications, problem list, medical history, surgical history, family history, social history, and previous encounter notes.  I,Special Puri,acting as a Neurosurgeon for [employer], DO.,have documented all relevant documentation on the behalf of Thomasene Lot, DO,as directed by  Thomasene Lot, DO while in the presence of Thomasene Lot, DO.   I, Thomasene Lot, DO, have reviewed all documentation for this visit. The documentation on 04/22/23 for the exam, diagnosis, procedures, and orders are all accurate and complete.

## 2023-05-10 ENCOUNTER — Ambulatory Visit (INDEPENDENT_AMBULATORY_CARE_PROVIDER_SITE_OTHER): Payer: BC Managed Care – PPO | Admitting: Family Medicine

## 2023-05-10 ENCOUNTER — Telehealth: Payer: BC Managed Care – PPO | Admitting: Emergency Medicine

## 2023-05-10 DIAGNOSIS — J069 Acute upper respiratory infection, unspecified: Secondary | ICD-10-CM

## 2023-05-10 MED ORDER — FLUTICASONE PROPIONATE 50 MCG/ACT NA SUSP: 2.0000 | Freq: Every day | NASAL | 0 refills | Status: AC

## 2023-05-10 MED ORDER — BENZONATATE 200 MG PO CAPS: 200.0000 mg | ORAL_CAPSULE | Freq: Two times a day (BID) | ORAL | 0 refills | Status: DC | PRN

## 2023-05-10 NOTE — Progress Notes (Signed)
E-Visit for Upper Respiratory Infection   Let me know if you need a note for work.   We are sorry you are not feeling well.  Here is how we plan to help!  Based on what you have shared with me, it looks like you may have a viral upper respiratory infection.  Upper respiratory infections are caused by a large number of viruses; however, rhinovirus is the most common cause.   Symptoms vary from person to person, with common symptoms including sore throat, cough, fatigue or lack of energy and feeling of general discomfort.  A low-grade fever of up to 100.4 may present, but is often uncommon.  Symptoms vary however, and are closely related to a person's age or underlying illnesses.    The most common symptoms associated with an upper respiratory infection are nasal discharge or congestion, cough, sneezing, headache and pressure in the ears and face.  These symptoms usually persist for about 3 to 10 days, but can last up to 2 weeks.  It is important to know that upper respiratory infections do not cause serious illness or complications in most cases.    Upper respiratory infections can be transmitted from person to person, with the most common method of transmission being a person's hands.  The virus is able to live on the skin and can infect other persons for up to 2 hours after direct contact.  Also, these can be transmitted when someone coughs or sneezes; thus, it is important to cover the mouth to reduce this risk.  To keep the spread of the illness at bay, good hand hygiene is very important.  This is an infection that is most likely caused by a virus. There are no specific treatments other than to help you with the symptoms until the infection runs its course.  We are sorry you are not feeling well.  Here is how we plan to help!   For nasal congestion, you may use an oral decongestants such as Mucinex D or if you have glaucoma or high blood pressure use plain Mucinex.    Saline nasal spray or  nasal drops can help and can safely be used as often as needed for congestion.  Try using saline irrigation, such as with a neti pot, several times a day while you are sick. Many neti pots come with salt packets premeasured to use to make saline. If you use your own salt, make sure it is kosher salt or sea salt (don't use table salt as it has iodine in it and you don't need that in your nose). Use distilled water to make saline. If you mix your own saline using your own salt, the recipe is 1/4 teaspoon salt in 1 cup warm water. Using saline irrigation can help prevent and treat sinus infections.   For your congestion, I have prescribed Fluticasone nasal spray one spray in each nostril twice a day  If you do not have a history of heart disease, hypertension, diabetes or thyroid disease, prostate/bladder issues or glaucoma, you may also use Sudafed to treat nasal congestion.  It is highly recommended that you consult with a pharmacist or your primary care physician to ensure this medication is safe for you to take.     If you have a cough, you may use cough suppressants such as Delsym and Robitussin.  If you have glaucoma or high blood pressure, you can also use Coricidin HBP.    For cough I have prescribed for you A  prescription cough medication called Tessalon Perles 100 mg. You may take 1-2 capsules every 8 hours as needed for cough  If you have a sore or scratchy throat, use a saltwater gargle-  to  teaspoon of salt dissolved in a 4-ounce to 8-ounce glass of warm water.  Gargle the solution for approximately 15-30 seconds and then spit.  It is important not to swallow the solution.  You can also use throat lozenges/cough drops and Chloraseptic spray to help with throat pain or discomfort.  Warm or cold liquids can also be helpful in relieving throat pain.  For headache, pain or general discomfort, you can use Ibuprofen or Tylenol as directed.   Some authorities believe that zinc sprays or the use of  Echinacea may shorten the course of your symptoms.   HOME CARE Only take medications as instructed by your medical team. Be sure to drink plenty of fluids. Water is fine as well as fruit juices, sodas and electrolyte beverages. You may want to stay away from caffeine or alcohol. If you are nauseated, try taking small sips of liquids. How do you know if you are getting enough fluid? Your urine should be a pale yellow or almost colorless. Get rest. Taking a steamy shower or using a humidifier may help nasal congestion and ease sore throat pain. You can place a towel over your head and breathe in the steam from hot water coming from a faucet. Using a saline nasal spray works much the same way. Cough drops, hard candies and sore throat lozenges may ease your cough. Avoid close contacts especially the very young and the elderly Cover your mouth if you cough or sneeze Always remember to wash your hands.   GET HELP RIGHT AWAY IF: You develop worsening fever. If your symptoms do not improve within 10 days You develop yellow or green discharge from your nose over 3 days. You have coughing fits You develop a severe head ache or visual changes. You develop shortness of breath, difficulty breathing or start having chest pain Your symptoms persist after you have completed your treatment plan  MAKE SURE YOU  Understand these instructions. Will watch your condition. Will get help right away if you are not doing well or get worse.  Thank you for choosing an e-visit.  Your e-visit answers were reviewed by a board certified advanced clinical practitioner to complete your personal care plan. Depending upon the condition, your plan could have included both over the counter or prescription medications.  Please review your pharmacy choice. Make sure the pharmacy is open so you can pick up prescription now. If there is a problem, you may contact your provider through [employer] and have the  prescription routed to another pharmacy.  Your safety is important to [place]. If you have drug allergies check your prescription carefully.   For the next 24 hours you can use MyChart to ask questions about today's visit, request a non-urgent call back, or ask for a work or school excuse. You will get an email in the next two days asking about your experience. I hope that your e-visit has been valuable and will speed your recovery.  I have spent 5 minutes in review of e-visit questionnaire, review and updating patient chart, medical decision making and response to patient.   Rica Mast, PhD, FNP-BC

## 2023-05-20 ENCOUNTER — Ambulatory Visit (INDEPENDENT_AMBULATORY_CARE_PROVIDER_SITE_OTHER): Payer: BC Managed Care – PPO | Admitting: Family Medicine

## 2023-05-20 ENCOUNTER — Other Ambulatory Visit (INDEPENDENT_AMBULATORY_CARE_PROVIDER_SITE_OTHER): Payer: Self-pay | Admitting: Family Medicine

## 2023-05-20 ENCOUNTER — Encounter (INDEPENDENT_AMBULATORY_CARE_PROVIDER_SITE_OTHER): Payer: Self-pay | Admitting: Family Medicine

## 2023-05-20 VITALS — BP 137/78 | HR 66 | Temp 98.1°F | Ht 71.0 in | Wt 314.0 lb

## 2023-05-20 DIAGNOSIS — F3289 Other specified depressive episodes: Secondary | ICD-10-CM | POA: Diagnosis not present

## 2023-05-20 DIAGNOSIS — R7303 Prediabetes: Secondary | ICD-10-CM | POA: Diagnosis not present

## 2023-05-20 DIAGNOSIS — I10 Essential (primary) hypertension: Secondary | ICD-10-CM

## 2023-05-20 DIAGNOSIS — Z6841 Body Mass Index (BMI) 40.0 and over, adult: Secondary | ICD-10-CM

## 2023-05-20 DIAGNOSIS — E559 Vitamin D deficiency, unspecified: Secondary | ICD-10-CM

## 2023-05-20 DIAGNOSIS — E669 Obesity, unspecified: Secondary | ICD-10-CM

## 2023-05-20 MED ORDER — BUPROPION HCL ER (SR) 150 MG PO TB12: ORAL_TABLET | ORAL | 0 refills | Status: DC

## 2023-05-20 MED ORDER — VITAMIN D (ERGOCALCIFEROL) 1.25 MG (50000 UNIT) PO CAPS: 50000.0000 [IU] | ORAL_CAPSULE | ORAL | 0 refills | Status: DC

## 2023-05-20 MED ORDER — LOSARTAN POTASSIUM 50 MG PO TABS
50.0000 mg | ORAL_TABLET | Freq: Every day | ORAL | 0 refills | Status: DC
Start: 2023-05-20 — End: 2023-06-17

## 2023-05-20 MED ORDER — METFORMIN HCL 500 MG PO TABS: ORAL_TABLET | ORAL | 0 refills | Status: DC

## 2023-05-20 MED ORDER — HYDROCHLOROTHIAZIDE 25 MG PO TABS: 25.0000 mg | ORAL_TABLET | Freq: Every day | ORAL | 1 refills | Status: DC

## 2023-05-20 NOTE — Progress Notes (Signed)
.[facility]  Office: [phone]  /  Fax: [phone]  WEIGHT SUMMARY AND BIOMETRICS  Anthropometric Measurements Height: 5\' 11"  (1.803 m) Weight: (!) 314 lb (142.4 kg) BMI (Calculated): 43.81 Weight at Last Visit: 315 lb Weight Lost Since Last Visit: 1 lb Weight Gained Since Last Visit: 0 Starting Weight: 326 lb   Body Composition  Body Fat %: 40.3 % Fat Mass (lbs): 126.6 lbs Muscle Mass (lbs): 178.4 lbs Total Body Water (lbs): 136.4 lbs Visceral Fat Rating : 27   Other Clinical Data Fasting: No Labs: No Today's Visit #: 23 Starting Date: 09/08/17    Chief Complaint: OBESITY   Discussed the use of AI scribe software for clinical note transcription with the patient, who gave verbal consent to proceed.  History of Present Illness   Bryan [name], a 62 year old individual with obesity, hypertension, vitamin D deficiency, and prediabetes, presents for a follow-up visit. He reports adherence to the category three plan approximately 70% of the time and has been engaging in regular exercise, walking for 30-45 minutes four times a week. He has lost one pound since the last visit a month ago.  Recently, he experienced an upper respiratory infection, which temporarily disrupted his exercise routine. The infection was characterized by a persistent cough but no associated body aches or nausea. Over-the-counter medications, including Theraflu, were used for symptom management.  He expresses concern about the cost of his weight loss medications, Saxenda and Monjorno, which have become prohibitively expensive. As a result, he has not taken these medications for over a month and is committed to managing his weight through diet and exercise alone. He noticed a slight weight gain after discontinuing the medications.  He reports some difficulty in incorporating enough vegetables into his diet and is seeking ways to improve this. He enjoys a variety of vegetables, including green beans, zucchini, and  asparagus, and is considering adding more beans to his meals.  His blood pressure has been slightly elevated at recent checks, which he attributes to the use of cold medicine. He plans to monitor his blood pressure at home more frequently. His current medication regimen includes losartan, hydrochlorothiazide, metformin, and bupropion. He is also on a vitamin D supplement.  He has several trips planned in the coming months and is anticipating increased physical activity during these trips. He is committed to maintaining his diet and exercise routine during his travels.          PHYSICAL EXAM:  Blood pressure 137/78, pulse 66, temperature 98.1 F (36.7 C), height 5\' 11"  (1.803 m), weight (!) 314 lb (142.4 kg), SpO2 97 %. Body mass index is 43.79 kg/m.  DIAGNOSTIC DATA REVIEWED:  BMET    Component Value Date/Time   NA 141 08/13/2022 1302   K 4.3 08/13/2022 1302   CL 103 08/13/2022 1302   CO2 20 08/13/2022 1302   GLUCOSE 103 (H) 08/13/2022 1302   GLUCOSE 135 (H) 09/06/2021 1035   BUN 18 08/13/2022 1302   CREATININE 0.92 08/13/2022 1302   CALCIUM 9.2 08/13/2022 1302   GFRNONAA >60 09/06/2021 1035   GFRAA 96 05/29/2020 1417   Lab Results  Component Value Date   HGBA1C 5.9 (H) 08/13/2022   HGBA1C 5.9 08/15/2013   Lab Results  Component Value Date   INSULIN 9.7 08/13/2022   INSULIN 21.9 09/08/2017   Lab Results  Component Value Date   TSH 1.600 05/25/2021   CBC    Component Value Date/Time   WBC 7.9 12/31/2021 0809   WBC 11.8 (H)  09/06/2021 1035   RBC 5.33 12/31/2021 0809   RBC 5.94 (H) 09/06/2021 1035   HGB 15.4 12/31/2021 0809   HCT 44.8 12/31/2021 0809   PLT 301 12/31/2021 0809   MCV 84 12/31/2021 0809   MCH 28.9 12/31/2021 0809   MCH 28.5 09/06/2021 1035   MCHC 34.4 12/31/2021 0809   MCHC 33.9 09/06/2021 1035   RDW 13.3 12/31/2021 0809   Iron Studies No results found for: "IRON", "TIBC", "FERRITIN", "IRONPCTSAT" Lipid Panel     Component Value Date/Time    CHOL 144 08/13/2022 1302   TRIG 205 (H) 08/13/2022 1302   HDL 44 08/13/2022 1302   CHOLHDL 4 07/07/2019 0835   VLDL 38.2 07/07/2019 0835   LDLCALC 66 08/13/2022 1302   LDLDIRECT 136.0 11/16/2016 0902   Hepatic Function Panel     Component Value Date/Time   PROT 6.6 08/13/2022 1302   ALBUMIN 4.2 08/13/2022 1302   AST 20 08/13/2022 1302   ALT 28 08/13/2022 1302   ALKPHOS 75 08/13/2022 1302   BILITOT 0.4 08/13/2022 1302   BILIDIR 0.0 02/26/2012 1353      Component Value Date/Time   TSH 1.600 05/25/2021 1812   TSH 1.560 01/10/2020 1042   Nutritional Lab Results  Component Value Date   VD25OH 49.8 08/13/2022   VD25OH 55.45 09/15/2021   VD25OH 47.1 02/10/2021     Assessment and Plan    Obesity: Patient reports adherence to category three plan approximately 70% of the time and has been engaging in regular exercise. Noted a 1lb weight loss since last visit. Patient recently discontinued Saxenda due to cost. Discussed the importance of meal planning and incorporating more vegetables into diet. -Continue category three plan and regular exercise. -Consider reintroduction of Saxenda if it becomes financially feasible.  Hypertension: Elevated blood pressure noted at last two visits. Patient has been taking over-the-counter cold medicine which may affect blood pressure. -Continue Losartan and Hydrochlorothiazide. -Monitor blood pressure at home and keep a log.  Prediabetes: Patient has discontinued Saxenda, which may have an impact on blood glucose control. -Continue Metformin. -Monitor blood glucose levels.  Vitamin D deficiency: No specific discussion in this visit. -Continue Vitamin D supplementation.  Depression with emotional eating behaviors: Patient is currently on Wellbutrin. -Continue Wellbutrin and continue to work on decreasing EEB.  Follow-up plans: -Next appointment with Dr. Val Eagle on June 17, 2023. -Schedule follow-up appointment for August 2024 at the front desk.           He was informed of the importance of frequent follow up visits to maximize his success with intensive lifestyle modifications for his multiple health conditions.   Quillian Quince, MD

## 2023-06-11 ENCOUNTER — Other Ambulatory Visit (INDEPENDENT_AMBULATORY_CARE_PROVIDER_SITE_OTHER): Payer: Self-pay | Admitting: Family Medicine

## 2023-06-11 DIAGNOSIS — I10 Essential (primary) hypertension: Secondary | ICD-10-CM

## 2023-06-11 DIAGNOSIS — F3289 Other specified depressive episodes: Secondary | ICD-10-CM

## 2023-06-11 DIAGNOSIS — R7303 Prediabetes: Secondary | ICD-10-CM

## 2023-06-17 ENCOUNTER — Ambulatory Visit (INDEPENDENT_AMBULATORY_CARE_PROVIDER_SITE_OTHER): Payer: BC Managed Care – PPO | Admitting: Family Medicine

## 2023-06-17 ENCOUNTER — Other Ambulatory Visit (INDEPENDENT_AMBULATORY_CARE_PROVIDER_SITE_OTHER): Payer: Self-pay | Admitting: Family Medicine

## 2023-06-17 ENCOUNTER — Encounter (INDEPENDENT_AMBULATORY_CARE_PROVIDER_SITE_OTHER): Payer: Self-pay | Admitting: Family Medicine

## 2023-06-17 VITALS — BP 126/75 | HR 66 | Temp 97.6°F | Ht 71.0 in | Wt 313.0 lb

## 2023-06-17 DIAGNOSIS — I10 Essential (primary) hypertension: Secondary | ICD-10-CM

## 2023-06-17 DIAGNOSIS — E65 Localized adiposity: Secondary | ICD-10-CM | POA: Diagnosis not present

## 2023-06-17 DIAGNOSIS — R7303 Prediabetes: Secondary | ICD-10-CM | POA: Diagnosis not present

## 2023-06-17 DIAGNOSIS — F3289 Other specified depressive episodes: Secondary | ICD-10-CM

## 2023-06-17 DIAGNOSIS — E559 Vitamin D deficiency, unspecified: Secondary | ICD-10-CM | POA: Diagnosis not present

## 2023-06-17 DIAGNOSIS — Z6841 Body Mass Index (BMI) 40.0 and over, adult: Secondary | ICD-10-CM

## 2023-06-17 DIAGNOSIS — E669 Obesity, unspecified: Secondary | ICD-10-CM

## 2023-06-17 MED ORDER — BUPROPION HCL ER (SR) 150 MG PO TB12
ORAL_TABLET | ORAL | 0 refills | Status: DC
Start: 2023-06-17 — End: 2023-08-19

## 2023-06-17 MED ORDER — LOSARTAN POTASSIUM 50 MG PO TABS: 50.0000 mg | ORAL_TABLET | Freq: Every day | ORAL | 0 refills | Status: DC

## 2023-06-17 MED ORDER — METFORMIN HCL 500 MG PO TABS: ORAL_TABLET | ORAL | 0 refills | Status: DC

## 2023-06-17 MED ORDER — VITAMIN D (ERGOCALCIFEROL) 1.25 MG (50000 UNIT) PO CAPS: 50000.0000 [IU] | ORAL_CAPSULE | ORAL | 0 refills | Status: DC

## 2023-06-17 NOTE — Progress Notes (Signed)
Bryan [name], D.O.  [facility], [facility] Specializing in Clinical Bariatric Medicine  Office located at: [address] [place], [place]  [address]     Assessment and Plan:   Medications Discontinued During This Encounter  Medication Reason   Vitamin D, Ergocalciferol, (DRISDOL) 1.25 MG (50000 UNIT) CAPS capsule Reorder   metFORMIN (GLUCOPHAGE) 500 MG tablet Reorder   losartan (COZAAR) 50 MG tablet Reorder     Meds ordered this encounter  Medications   Vitamin D, Ergocalciferol, (DRISDOL) 1.25 MG (50000 UNIT) CAPS capsule    Sig: Take 1 capsule (50,000 Units total) by mouth every Sunday.    Dispense:  4 capsule    Refill:  0    30 d supply;  ** OV for RF **   Do not send RF request   metFORMIN (GLUCOPHAGE) 500 MG tablet    Sig: TAKE 1 TABLET(500 MG) BY MOUTH THREE TIMES DAILY    Dispense:  90 tablet    Refill:  0   losartan (COZAAR) 50 MG tablet    Sig: Take 1 tablet (50 mg total) by mouth daily.    Dispense:  30 tablet    Refill:  0    30 d supply;  ** OV for RF **   Do not send RF request     Check Fasting Labs next OV   Vitamin D deficiency Assessment: This condition is being treated with Ergocalciferol 50K IU weekly and he is tolerating supplement well. No concerns.   Lab Results  Component Value Date   VD25OH 49.8 08/13/2022   VD25OH 55.45 09/15/2021   VD25OH 47.1 02/10/2021   Plan: Continue with meal plan and their weight loss efforts to further improve this condition. Pt advised to maintain with ERGO at current dose. Will refill  today.     Pre-diabetes Assessment: Condition is not quite optimized and is being treated with Metformin 500 mg TID. He is responding to Metformin well and denies any N/V/D. His hunger and cravings are better controlled when following his prudent nutritional plan.   Lab Results  Component Value Date   HGBA1C 5.9 (H) 08/13/2022   HGBA1C 5.8 09/15/2021   HGBA1C 6.2 (H) 02/10/2021   INSULIN 9.7 08/13/2022   INSULIN 22.6  02/10/2021   INSULIN 20.9 05/29/2020    Plan: Continue with Metformin at current dose. Will refill today.  Bryan [name] will continue to work on weight loss, exercise, via their meal plan we devised to help decrease the risk of progressing to diabetes. Will continue to monitor condition alongside PCP.    Essential hypertension Assessment: His blood pressure is stable. No concerns in this regard today. His HTN is being treated with Hydrodiuril 25 mg daily and Cozaar 50 mg daily. Denies any adverse effects.   Last 3 blood pressure readings in our office are as follows: BP Readings from Last 3 Encounters:  06/17/23 126/75  05/20/23 137/78  04/22/23 (!) 148/72   Plan: Continue with both antihypertensive medications at current doses. Will refill Cozaar today.  Continue to avoid buying foods that are: processed, frozen, or prepackaged to avoid excess salt.Ambulatory blood pressure monitoring encouraged.  Reminded patient that if they ever feel poorly in any way, to check their blood pressure and pulse as well.We will continue to monitor closely alongside PCP/ specialists.  Pt reminded to also f/up with those individuals as instructed by them. We will continue to monitor symptoms as they relate to the his weight loss journey.  Visceral obesity Assessment: Current visceral fat rating: 27.  The visceral fat rating should < 10 in a male.    Plan: Counseling done on: Visceral adipose tissue is a hormonally active component of total body fat. This body composition phenotype is associated with medical disorders such as metabolic syndrome, cardiovascular disease and several malignancies including prostate, breast, and colorectal cancers. -Current goal: Lose 7-10% of weight via prudent nutritional plan and lifestyle changes.     TREATMENT PLAN FOR OBESITY: BMI 40.0-44.9, adult-with current bmi 43.67 Obesity, Beginning BMI 46.78/Date 09/08/2017 Assessment: Bryan [name] is here to discuss his progress with  his obesity treatment plan along with follow-up of his obesity related diagnoses. See Medical Weight Management Flowsheet for complete bioelectrical impedance results.  Condition is improving, but not optimized Biometric data collected today, was reviewed with patient.   Since last office visit on 05/20/23 patient's  Muscle mass has increased by 2.6 lb. Fat mass has decreased by 2.3 lb. Total body water has increased by 1.4 lb.  Counseling done on how various foods will affect these numbers and how to maximize success  Total lbs lost to date: 13  Total weight loss percentage to date: 3.99   Plan: I discussed the importance of food journaling and how it can help as a mindfulness activity. Begin journaling 2,000 - 2,100 calories and 140+ grams protein daily with the Category 3 meal plan with breakfast and lunch options as a guide.   Behavioral Intervention Additional resources provided today: Food journaling plan information Evidence-based interventions for health behavior change were utilized today including the discussion of self monitoring techniques, problem-solving barriers and SMART goal setting techniques.   Regarding patient's less desirable eating habits and patterns, we employed the technique of small changes.  Pt will specifically work on: journaling his intake/bringing food log and maintaining current exercise regiment for next visit.    Recommended Physical Activity Goals  Bryan [name] has been advised to slowly work up to 150 minutes of moderate intensity aerobic activity a week and strengthening exercises 2-3 times per week for cardiovascular health, weight loss maintenance and preservation of muscle mass.   He has agreed to Continue current level of physical activity   FOLLOW UP: Return in about 5 weeks (around 07/22/2023). He was informed of the importance of frequent follow up visits to maximize his success with intensive lifestyle modifications for his multiple health  conditions.  Subjective:   Chief complaint: Obesity Bryan [name] is here to discuss his progress with his obesity treatment plan. He is on the Category 3 Plan with b/l options and states he is following his eating plan approximately 75% of the time. He states he is walking 90 minutes 5 days per week.  Interval History:  CHIA ROCK is here for a follow up office visit. Since last OV, Keven has been doing well. He just returned from a mountain trip to [place], [place]; he went there to celebrate his birthday/4th of July weekend. Endorses that consuming all the protein on the meal plan has still been an consistent challenge. Hunger and cravings are pretty well controlled.   Pharmacotherapy for weight loss: He is currently taking  Wellbutrin and Metformin   for medical weight loss.  Denies side effects.    Review of Systems:  Pertinent positives were addressed with patient today.  Reviewed by clinician on day of visit: allergies, medications, problem list, medical history, surgical history, family history, social history, and previous encounter notes.  Weight Summary and Biometrics  Weight Lost Since Last Visit: 1lb  Weight Gained Since Last Visit: 0lb    Vitals Temp: 97.6 F (36.4 C) BP: 126/75 Pulse Rate: 66 SpO2: 96 %   Anthropometric Measurements Height: 5\' 11"  (1.803 m) Weight: (!) 313 lb (142 kg) BMI (Calculated): 43.67 Weight at Last Visit: 314lb Weight Lost Since Last Visit: 1lb Weight Gained Since Last Visit: 0lb Starting Weight: 326lb Total Weight Loss (lbs): 13 lb (5.897 kg) Peak Weight: 330lb   Body Composition  Body Fat %: 39.3 % Fat Mass (lbs): 123 lbs Muscle Mass (lbs): 181 lbs Total Body Water (lbs): 137.8 lbs Visceral Fat Rating : 27   Other Clinical Data Fasting: no Labs: no Today's Visit #: 88 Starting Date: 09/08/17   Objective:   PHYSICAL EXAM: Blood pressure 126/75, pulse 66, temperature 97.6 F (36.4 C), height 5\' 11"  (1.803 m), weight (!)  313 lb (142 kg), SpO2 96%. Body mass index is 43.65 kg/m.  General: Well Developed, well nourished, and in no acute distress.  HEENT: Normocephalic, atraumatic Skin: Warm and dry, cap RF less 2 sec, good turgor Chest:  Normal excursion, shape, no gross abn Respiratory: speaking in full sentences, no conversational dyspnea NeuroM-Sk: Ambulates w/o assistance, moves * 4 Psych: A and O *3, insight good, mood-full  DIAGNOSTIC DATA REVIEWED:  BMET    Component Value Date/Time   NA 141 08/13/2022 1302   K 4.3 08/13/2022 1302   CL 103 08/13/2022 1302   CO2 20 08/13/2022 1302   GLUCOSE 103 (H) 08/13/2022 1302   GLUCOSE 135 (H) 09/06/2021 1035   BUN 18 08/13/2022 1302   CREATININE 0.92 08/13/2022 1302   CALCIUM 9.2 08/13/2022 1302   GFRNONAA >60 09/06/2021 1035   GFRAA 96 05/29/2020 1417   Lab Results  Component Value Date   HGBA1C 5.9 (H) 08/13/2022   HGBA1C 5.9 08/15/2013   Lab Results  Component Value Date   INSULIN 9.7 08/13/2022   INSULIN 21.9 09/08/2017   Lab Results  Component Value Date   TSH 1.600 05/25/2021   CBC    Component Value Date/Time   WBC 7.9 12/31/2021 0809   WBC 11.8 (H) 09/06/2021 1035   RBC 5.33 12/31/2021 0809   RBC 5.94 (H) 09/06/2021 1035   HGB 15.4 12/31/2021 0809   HCT 44.8 12/31/2021 0809   PLT 301 12/31/2021 0809   MCV 84 12/31/2021 0809   MCH 28.9 12/31/2021 0809   MCH 28.5 09/06/2021 1035   MCHC 34.4 12/31/2021 0809   MCHC 33.9 09/06/2021 1035   RDW 13.3 12/31/2021 0809   Iron Studies No results found for: "IRON", "TIBC", "FERRITIN", "IRONPCTSAT" Lipid Panel     Component Value Date/Time   CHOL 144 08/13/2022 1302   TRIG 205 (H) 08/13/2022 1302   HDL 44 08/13/2022 1302   CHOLHDL 4 07/07/2019 0835   VLDL 38.2 07/07/2019 0835   LDLCALC 66 08/13/2022 1302   LDLDIRECT 136.0 11/16/2016 0902   Hepatic Function Panel     Component Value Date/Time   PROT 6.6 08/13/2022 1302   ALBUMIN 4.2 08/13/2022 1302   AST 20 08/13/2022  1302   ALT 28 08/13/2022 1302   ALKPHOS 75 08/13/2022 1302   BILITOT 0.4 08/13/2022 1302   BILIDIR 0.0 02/26/2012 1353      Component Value Date/Time   TSH 1.600 05/25/2021 1812   TSH 1.560 01/10/2020 1042   Nutritional Lab Results  Component Value Date   VD25OH 49.8 08/13/2022   VD25OH 55.45 09/15/2021   VD25OH  47.1 02/10/2021    Attestations:   I, Special Puri , acting as a Stage manager for [employer], DO., have compiled all relevant documentation for today's office visit on behalf of Thomasene Lot, DO, while in the presence of [employer], DO.  I have reviewed the above documentation for accuracy and completeness, and I agree with the above. Bryan [name], D.O.  The 21st Century Cures Act was signed into law in 2016 which includes the topic of electronic health records.  This provides immediate access to information in MyChart.  This includes consultation notes, operative notes, office notes, lab results and pathology reports.  If you have any questions about what you read please let us know at your next visit so we can discuss your concerns and take corrective action if need be.  We are right here with you.

## 2023-07-11 ENCOUNTER — Other Ambulatory Visit (INDEPENDENT_AMBULATORY_CARE_PROVIDER_SITE_OTHER): Payer: Self-pay | Admitting: Family Medicine

## 2023-07-11 DIAGNOSIS — I10 Essential (primary) hypertension: Secondary | ICD-10-CM

## 2023-07-11 DIAGNOSIS — F3289 Other specified depressive episodes: Secondary | ICD-10-CM

## 2023-07-11 DIAGNOSIS — R7303 Prediabetes: Secondary | ICD-10-CM

## 2023-07-22 ENCOUNTER — Telehealth (INDEPENDENT_AMBULATORY_CARE_PROVIDER_SITE_OTHER): Payer: BC Managed Care – PPO | Admitting: Family Medicine

## 2023-07-22 ENCOUNTER — Encounter (INDEPENDENT_AMBULATORY_CARE_PROVIDER_SITE_OTHER): Payer: Self-pay | Admitting: Family Medicine

## 2023-07-22 DIAGNOSIS — Z6841 Body Mass Index (BMI) 40.0 and over, adult: Secondary | ICD-10-CM

## 2023-07-22 DIAGNOSIS — I10 Essential (primary) hypertension: Secondary | ICD-10-CM

## 2023-07-22 DIAGNOSIS — R7303 Prediabetes: Secondary | ICD-10-CM | POA: Diagnosis not present

## 2023-07-22 DIAGNOSIS — E559 Vitamin D deficiency, unspecified: Secondary | ICD-10-CM | POA: Diagnosis not present

## 2023-07-22 MED ORDER — METFORMIN HCL 500 MG PO TABS
ORAL_TABLET | ORAL | 0 refills | Status: DC
Start: 2023-07-22 — End: 2023-08-19

## 2023-07-22 MED ORDER — LOSARTAN POTASSIUM 50 MG PO TABS
50.0000 mg | ORAL_TABLET | Freq: Every day | ORAL | 0 refills | Status: DC
Start: 2023-07-22 — End: 2023-08-19

## 2023-07-22 MED ORDER — VITAMIN D (ERGOCALCIFEROL) 1.25 MG (50000 UNIT) PO CAPS
50000.0000 [IU] | ORAL_CAPSULE | ORAL | 0 refills | Status: DC
Start: 2023-07-25 — End: 2023-08-19

## 2023-07-22 NOTE — Progress Notes (Signed)
TeleHealth Visit:  This visit was completed with telemedicine (audio/video) technology. Bryan [name] has verbally consented to this TeleHealth visit. The patient is located at home, the provider is located at home. The participants in this visit include the listed provider and patient. The visit was conducted today via MyChart video.  OBESITY Bryan [name] is here to discuss his progress with his obesity treatment plan along with follow-up of his obesity related diagnoses.   Today's visit was # 89 Starting weight: 326 lbs Starting date: 09/08/17 Weight at last in office visit: 313 lbs on 06/17/23 Total weight loss: 13 lbs at last in office visit on 06/17/23. Today's reported weight (07/22/23): none reported  Nutrition Plan: the Category 3 plan, keeping a food journal with goal of 2000-2100 calories and 140+ grams of protein daily, and with breakfast and lunch options - 50% adherence.  Current exercise:  none  Interim History:  He has lost 13 pounds (3.99%) over the past 5-6 years with our clinic. He has not started journaling. He is "stretched thin" caring for his mom and has not been following a plan. His mom is in poor health and is 74 yo. Wife is a patient at our clinic also. Drinks a good amount of water. Also has coffee. Has treadmill but has not used it lately.  He has been on [place] in the past and felt both helped a great deal with hunger and cravings.  These were discontinued due to cost.  Typical day of food: Breakfast: greek yogurt, peanut butter toast, string cheese Lunch: eats out or packs lunch (tries to make good choices when he eats out but does not always) Dinner: eats at home and often cooks for mom- healthy but not always much protein. Snacks: cheese   Assessment/Plan:  1. Hypertension Hypertension well controlled.  Medication(s): Losartan 50 mg daily, hydrochlorothiazide 25 mg daily.  BP Readings from Last 3 Encounters:  06/17/23 126/75   05/20/23 137/78  04/22/23 (!) 148/72   Lab Results  Component Value Date   CREATININE 0.92 08/13/2022   CREATININE 0.92 12/31/2021   CREATININE 0.98 09/06/2021   Lab Results  Component Value Date   GFR 89.53 08/04/2019   GFR 94.50 07/07/2019   GFR 102.60 06/23/2017    Plan: Continue hydrochlorothiazide 25 mg daily. Refill losartan 50 mg daily.   2. Vitamin D Deficiency Vitamin D is nearly at goal of 50.  Most recent vitamin D level was 49.[name] He is on  prescription ergocalciferol 50,000 IU weekly. Lab Results  Component Value Date   VD25OH 49.8 08/13/2022   VD25OH 55.45 09/15/2021   VD25OH 47.1 02/10/2021    Plan: Continue and refill  prescription ergocalciferol 50,000 IU weekly He is scheduled to have labs checked next office visit.   3. Prediabetes Last A1c was elevated at 5.9. Medication(s): Metformin 500 mg 3 times daily with meals.  Lab Results  Component Value Date   HGBA1C 5.9 (H) 08/13/2022   HGBA1C 5.8 09/15/2021   HGBA1C 6.2 (H) 02/10/2021   HGBA1C 6.3 (H) 09/11/2020   HGBA1C 6.3 (H) 05/29/2020   Lab Results  Component Value Date   INSULIN 9.7 08/13/2022   INSULIN 22.6 02/10/2021   INSULIN 20.9 05/29/2020   INSULIN 24.0 01/10/2020   INSULIN 21.9 11/17/2018    Plan: Continue and refill Metformin 500 mg 3 times daily with meals.   4. Morbid Obesity: Current BMI 43  Bryan [name] is currently in the action stage of change. As such, his goal is to  continue with weight loss efforts.  He has agreed to the Category 3 plan and keeping a food journal with goal of 2000-2100 calories and 140+ grams of protein daily.  Exercise goals: All adults should avoid inactivity. Some physical activity is better than none, and adults who participate in any amount of physical activity gain some health benefits.  Behavioral modification strategies: increasing lean protein intake, decreasing simple carbohydrates , decrease eating out, meal planning , and planning for  success.  Bryan [name] has agreed to follow-up with our clinic in 4 weeks with fasting labs.  No orders of the defined types were placed in this encounter.   Medications Discontinued During This Encounter  Medication Reason   Liraglutide -Weight Management (SAXENDA) 18 MG/3ML SOPN Cost of medication   Insulin Pen Needle (BD PEN NEEDLE NANO U/F) 32G X 4 MM MISC Patient Preference   Vitamin D, Ergocalciferol, (DRISDOL) 1.25 MG (50000 UNIT) CAPS capsule Reorder   losartan (COZAAR) 50 MG tablet Reorder   metFORMIN (GLUCOPHAGE) 500 MG tablet Reorder     Meds ordered this encounter  Medications   Vitamin D, Ergocalciferol, (DRISDOL) 1.25 MG (50000 UNIT) CAPS capsule    Sig: Take 1 capsule (50,000 Units total) by mouth every Sunday.    Dispense:  4 capsule    Refill:  0    30 d supply;  ** OV for RF **   Do not send RF request    Order Specific Question:   Supervising Provider    Answer:   Glennis Brink [[id number]]   losartan (COZAAR) 50 MG tablet    Sig: Take 1 tablet (50 mg total) by mouth daily.    Dispense:  30 tablet    Refill:  0    30 d supply;  ** OV for RF **   Do not send RF request    Order Specific Question:   Supervising Provider    Answer:   Glennis Brink [[id number]]   metFORMIN (GLUCOPHAGE) 500 MG tablet    Sig: TAKE 1 TABLET(500 MG) BY MOUTH THREE TIMES DAILY    Dispense:  90 tablet    Refill:  0    Order Specific Question:   Supervising Provider    Answer:   Glennis Brink [[id number]]      Objective:   VITALS: Per patient if applicable, see vitals. GENERAL: Alert and in no acute distress. CARDIOPULMONARY: No increased WOB. Speaking in clear sentences.  PSYCH: Pleasant and cooperative. Speech normal rate and rhythm. Affect is appropriate. Insight and judgement are appropriate. Attention is focused, linear, and appropriate.  NEURO: Oriented as arrived to appointment on time with no prompting.   Attestation Statements:   Reviewed by clinician on day of visit: allergies,  medications, problem list, medical history, surgical history, family history, social history, and previous encounter notes.   This was prepared with the assistance of Engineer, civil (consulting).  Occasional wrong-word or sound-a-like substitutions may have occurred due to the inherent limitations of voice recognition software.

## 2023-08-10 ENCOUNTER — Other Ambulatory Visit (INDEPENDENT_AMBULATORY_CARE_PROVIDER_SITE_OTHER): Payer: Self-pay | Admitting: Family Medicine

## 2023-08-10 DIAGNOSIS — R7303 Prediabetes: Secondary | ICD-10-CM

## 2023-08-10 DIAGNOSIS — I10 Essential (primary) hypertension: Secondary | ICD-10-CM

## 2023-08-11 ENCOUNTER — Encounter: Payer: Self-pay | Admitting: Nurse Practitioner

## 2023-08-11 ENCOUNTER — Ambulatory Visit: Payer: BC Managed Care – PPO | Admitting: Nurse Practitioner

## 2023-08-11 VITALS — BP 124/70 | HR 64

## 2023-08-11 DIAGNOSIS — G4733 Obstructive sleep apnea (adult) (pediatric): Secondary | ICD-10-CM

## 2023-08-11 DIAGNOSIS — Z6841 Body Mass Index (BMI) 40.0 and over, adult: Secondary | ICD-10-CM | POA: Diagnosis not present

## 2023-08-11 NOTE — Progress Notes (Signed)
@Patient  ID: Bryan [name], male    DOB: Nov 08, 1961, 62 y.o.   MRN: [id number]  Chief Complaint  Patient presents with   Follow-up    Has an old machine and pressure is stronger than new machine.  Wants a travel machine.    Referring provider: Wanda Plump, MD  HPI: 62 year old male, never smoker followed for OSA on CPAP. He is a patient of Dr. Evlyn Courier and last seen in office 04/18/2021. Past medical history significant for HLD, allergies, prediabetes, HTN, a flutter.   TEST/EVENTS:  06/18/2021 echo: 65-70%, GIDD. RV size and function nl.   04/18/2021: OV with Dr. Craige Cotta. Uses CPAP nightly. Has nasal pillow mask. Doesn't feel like his machine is generating pressure like before. Orders sent for new CPAP 10 cmH2O.   08/11/2023: Today - follow up Patient presents today for follow up. Since he was here last, he got a new CPAP machine. Working well for him; although, he feels like the pressures could be higher. He does sleep with it nightly. Receives benefit from use. Feeling pretty well rested. He uses his old CPAP machine when he travels. He'd like to purchase a travel mini CPAP. Wearing  nasal pillow mask. Needs some new supplies for his home unit. Has been having trouble ordering on [employer]'s website. No drowsy driving or morning headaches.   07/12/2023-08/10/2023: CPAP 10 cmH2O 23/30 days; 77% >4 hr; average use 7 hr 23 min [place] [facility] 2.8 AHI 1.4   Allergies  Allergen Reactions   Lisinopril Cough    Immunization History  Administered Date(s) Administered   Influenza Whole 08/22/2010   Influenza,inj,Quad PF,6+ Mos 09/18/2013, 09/30/2015, 11/10/2017, 10/28/2019, 09/11/2020, 09/15/2021, 09/22/2022   Influenza-Unspecified 08/30/2014, 09/06/2016, 10/16/2018   Janssen (J&J) SARS-COV-2 Vaccination 02/17/2020   Moderna Sars-Covid-2 Vaccination 11/20/2020   Pneumococcal Polysaccharide-23 09/11/2020   Td 08/22/2010   Tdap 09/15/2021   Zoster Recombinant(Shingrix) 09/11/2020, 11/12/2020     Past Medical History:  Diagnosis Date   Allergy    Dental crowns present    Difficult intubation    Hyperlipidemia    Hypertension    Lower back pain    Nephrolithiasis 2017   Prediabetes 09/04/2014   Seasonal allergies    Sebaceous cyst 04/2013   upper back   Sleep apnea    uses CPAP nightly   Urolithiasis 10/2016    Tobacco History: Social History   Tobacco Use  Smoking Status Never   Passive exposure: Never  Smokeless Tobacco Never   Counseling given: Not Answered   Outpatient Medications Prior to Visit  Medication Sig Dispense Refill   aspirin EC 81 MG tablet Take 81 mg by mouth daily. Swallow whole.     atorvastatin (LIPITOR) 20 MG tablet Take 1 tablet (20 mg total) by mouth at bedtime. 90 tablet 2   azelastine (ASTELIN) 0.1 % nasal spray Place 2 sprays into both nostrils at bedtime as needed for rhinitis. Use in each nostril as directed 30 mL 5   b complex vitamins capsule Take 1 capsule by mouth daily.     benzonatate (TESSALON) 200 MG capsule Take 1 capsule (200 mg total) by mouth 2 (two) times daily as needed for cough. 20 capsule 0   buPROPion (WELLBUTRIN SR) 150 MG 12 hr tablet 1 po BID 60 tablet 0   CO ENZYME Q-10 PO Take 1 capsule by mouth in the morning.     diltiazem (CARDIZEM) 30 MG tablet Take 1 - 2 tablets by mouth every 6 hours  as needed for heart rate greater than 130 30 tablet 0   famotidine-calcium carbonate-magnesium hydroxide (PEPCID COMPLETE) 10-800-165 MG chewable tablet Chew 1 tablet by mouth daily as needed (acid reflux).     fish oil-omega-3 fatty acids 1000 MG capsule Take 1 g by mouth in the morning.     fluticasone (FLONASE) 50 MCG/ACT nasal spray Place 2 sprays into both nostrils daily. 16 g 0   hydrochlorothiazide (HYDRODIURIL) 25 MG tablet Take 1 tablet (25 mg total) by mouth daily. 90 tablet 1   ibuprofen (ADVIL) 200 MG tablet Take 400 mg by mouth every 6 (six) hours as needed for moderate pain.     losartan (COZAAR) 50 MG tablet  Take 1 tablet (50 mg total) by mouth daily. 30 tablet 0   MELATONIN PO Take 1 tablet by mouth at bedtime. With valerian root     metFORMIN (GLUCOPHAGE) 500 MG tablet TAKE 1 TABLET(500 MG) BY MOUTH THREE TIMES DAILY 90 tablet 0   metoprolol succinate (TOPROL-XL) 25 MG 24 hr tablet TAKE 1 TABLET(25 MG) BY MOUTH AT BEDTIME 90 tablet 3   Multiple Vitamins-Minerals (CENTRUM PO) Take 1 tablet by mouth in the morning.     omeprazole (PRILOSEC OTC) 20 MG tablet Take 20 mg by mouth daily as needed (acid reflux).     Vitamin D, Ergocalciferol, (DRISDOL) 1.25 MG (50000 UNIT) CAPS capsule Take 1 capsule (50,000 Units total) by mouth every Sunday. 4 capsule 0   No facility-administered medications prior to visit.     Review of Systems:   Constitutional: No weight loss or gain, night sweats, fevers, chills, fatigue, or lassitude. HEENT: No headaches, difficulty swallowing, sneezing, itching, ear ache, nasal congestion, or post nasal drip CV:  No chest pain, orthopnea, PND, swelling in lower extremities, anasarca, dizziness, palpitations, syncope Resp: No shortness of breath with exertion or at rest. No excess mucus or change in color of mucus. No productive or non-productive. No hemoptysis. No wheezing.  No chest wall deformity GI:  No heartburn, indigestion Skin: No rash, lesions, ulcerations MSK:  No joint pain or swelling.   Neuro: No dizziness or lightheadedness.  Psych: No depression or anxiety. Mood stable.     Physical Exam:  BP 124/70 (BP Location: Right Arm, Cuff Size: Large)   Pulse 64   SpO2 98%   GEN: Pleasant, interactive, well-appearing; obese; in no acute distress. HEENT:  Normocephalic and atraumatic. PERRLA. Sclera white. Nasal turbinates pink, moist and patent bilaterally. No rhinorrhea present. Oropharynx pink and moist, without exudate or edema. No lesions, ulcerations, or postnasal drip.  NECK:  Supple w/ fair ROM.  CV: RRR, no m/r/g, no peripheral edema. Pulses intact, +2  bilaterally. No cyanosis, pallor or clubbing. PULMONARY:  Unlabored, regular breathing. Clear bilaterally A&P w/o wheezes/rales/rhonchi. No accessory muscle use.  GI: BS present and normoactive. Soft, non-tender to palpation.  MSK: No erythema, warmth or tenderness. Cap refil <2 sec all extrem. No deformities or joint swelling noted.  Neuro: A/Ox3. No focal deficits noted.   Skin: Warm, no lesions or rashe Psych: Normal affect and behavior. Judgement and thought content appropriate.     Lab Results:  CBC    Component Value Date/Time   WBC 7.9 12/31/2021 0809   WBC 11.8 (H) 09/06/2021 1035   RBC 5.33 12/31/2021 0809   RBC 5.94 (H) 09/06/2021 1035   HGB 15.4 12/31/2021 0809   HCT 44.8 12/31/2021 0809   PLT 301 12/31/2021 0809   MCV 84 12/31/2021 0809  MCH 28.9 12/31/2021 0809   MCH 28.5 09/06/2021 1035   MCHC 34.4 12/31/2021 0809   MCHC 33.9 09/06/2021 1035   RDW 13.3 12/31/2021 0809   LYMPHSABS 2.2 12/31/2021 0809   MONOABS 0.5 07/07/2019 0835   EOSABS 0.2 12/31/2021 0809   BASOSABS 0.1 12/31/2021 0809    BMET    Component Value Date/Time   NA 141 08/13/2022 1302   K 4.3 08/13/2022 1302   CL 103 08/13/2022 1302   CO2 20 08/13/2022 1302   GLUCOSE 103 (H) 08/13/2022 1302   GLUCOSE 135 (H) 09/06/2021 1035   BUN 18 08/13/2022 1302   CREATININE 0.92 08/13/2022 1302   CALCIUM 9.2 08/13/2022 1302   GFRNONAA >60 09/06/2021 1035   GFRAA 96 05/29/2020 1417    BNP No results found for: "BNP"   Imaging:  No results found.  Administration History     None           No data to display          No results found for: "NITRICOXIDE"      Assessment & Plan:   Obstructive sleep apnea OSA on CPAP. Wears CPAP nightly with excellent control on download. He feels like pressures need to be increased. Will adjust him to auto CPAP 10-15 cmH2O. He will let us know if this helps or if he has further difficulties. Provided with rx for travel mini CPAP machine.  Orders sent for new supplies to his DME. Aware of proper use/care of device. Safe driving practices reviewed.   Patient Instructions  Continue to use CPAP every night, minimum of 4-6 hours a night.  Change equipment as directed. Wash your tubing with warm soap and water daily, hang to dry. Wash humidifier portion weekly. Use bottled, distilled water and change daily  Be aware of reduced alertness and do not drive or operate heavy machinery if experiencing this or drowsiness.  Exercise encouraged, as tolerated. Notify if persistent daytime sleepiness occurs even with consistent use of CPAP.  Adjust CPAP pressure to 10-15 cmH2O   Orders sent for new supplies   Provided with prescription for travel CPAP  Follow up in 6 months with Dr. Wynona Neat or Philis Nettle, or sooner, if needed    Class 3 severe obesity with serious comorbidity and body mass index (BMI) of 45.0 to 49.9 in adult [facility]) Healthy weight loss encouraged.   I spent 31 minutes of dedicated to the care of this patient on the date of this encounter to include pre-visit review of records, face-to-face time with the patient discussing conditions above, post visit ordering of testing, clinical documentation with the electronic health record, making appropriate referrals as documented, and communicating necessary findings to members of the patients care team.  Noemi Chapel, NP 08/11/2023  Pt aware and understands NP's role.

## 2023-08-11 NOTE — Patient Instructions (Addendum)
Continue to use CPAP every night, minimum of 4-6 hours a night.  Change equipment as directed. Wash your tubing with warm soap and water daily, hang to dry. Wash humidifier portion weekly. Use bottled, distilled water and change daily  Be aware of reduced alertness and do not drive or operate heavy machinery if experiencing this or drowsiness.  Exercise encouraged, as tolerated. Notify if persistent daytime sleepiness occurs even with consistent use of CPAP.  Adjust CPAP pressure to 10-15 cmH2O   Orders sent for new supplies   Provided with prescription for travel CPAP  Follow up in 6 months with Dr. Wynona Neat or Philis Nettle, or sooner, if needed

## 2023-08-11 NOTE — Assessment & Plan Note (Signed)
Healthy weight loss encouraged 

## 2023-08-11 NOTE — Assessment & Plan Note (Signed)
OSA on CPAP. Wears CPAP nightly with excellent control on download. He feels like pressures need to be increased. Will adjust him to auto CPAP 10-15 cmH2O. He will let us know if this helps or if he has further difficulties. Provided with rx for travel mini CPAP machine. Orders sent for new supplies to his DME. Aware of proper use/care of device. Safe driving practices reviewed.   Patient Instructions  Continue to use CPAP every night, minimum of 4-6 hours a night.  Change equipment as directed. Wash your tubing with warm soap and water daily, hang to dry. Wash humidifier portion weekly. Use bottled, distilled water and change daily  Be aware of reduced alertness and do not drive or operate heavy machinery if experiencing this or drowsiness.  Exercise encouraged, as tolerated. Notify if persistent daytime sleepiness occurs even with consistent use of CPAP.  Adjust CPAP pressure to 10-15 cmH2O   Orders sent for new supplies   Provided with prescription for travel CPAP  Follow up in 6 months with Dr. Wynona Neat or Philis Nettle, or sooner, if needed

## 2023-08-19 ENCOUNTER — Encounter (INDEPENDENT_AMBULATORY_CARE_PROVIDER_SITE_OTHER): Payer: Self-pay | Admitting: Family Medicine

## 2023-08-19 ENCOUNTER — Ambulatory Visit (INDEPENDENT_AMBULATORY_CARE_PROVIDER_SITE_OTHER): Payer: BC Managed Care – PPO | Admitting: Family Medicine

## 2023-08-19 ENCOUNTER — Other Ambulatory Visit (INDEPENDENT_AMBULATORY_CARE_PROVIDER_SITE_OTHER): Payer: Self-pay | Admitting: Family Medicine

## 2023-08-19 VITALS — BP 158/80 | HR 77 | Temp 97.9°F | Ht 71.0 in | Wt 315.0 lb

## 2023-08-19 DIAGNOSIS — E669 Obesity, unspecified: Secondary | ICD-10-CM

## 2023-08-19 DIAGNOSIS — Z6841 Body Mass Index (BMI) 40.0 and over, adult: Secondary | ICD-10-CM

## 2023-08-19 DIAGNOSIS — R7303 Prediabetes: Secondary | ICD-10-CM | POA: Diagnosis not present

## 2023-08-19 DIAGNOSIS — E66813 Obesity, class 3: Secondary | ICD-10-CM

## 2023-08-19 DIAGNOSIS — E559 Vitamin D deficiency, unspecified: Secondary | ICD-10-CM

## 2023-08-19 DIAGNOSIS — F3289 Other specified depressive episodes: Secondary | ICD-10-CM

## 2023-08-19 DIAGNOSIS — I10 Essential (primary) hypertension: Secondary | ICD-10-CM | POA: Diagnosis not present

## 2023-08-19 DIAGNOSIS — E7849 Other hyperlipidemia: Secondary | ICD-10-CM | POA: Diagnosis not present

## 2023-08-19 DIAGNOSIS — F5089 Other specified eating disorder: Secondary | ICD-10-CM

## 2023-08-19 MED ORDER — METFORMIN HCL 500 MG PO TABS
ORAL_TABLET | ORAL | 0 refills | Status: DC
Start: 2023-08-19 — End: 2023-11-17

## 2023-08-19 MED ORDER — VITAMIN D (ERGOCALCIFEROL) 1.25 MG (50000 UNIT) PO CAPS
50000.0000 [IU] | ORAL_CAPSULE | ORAL | 0 refills | Status: DC
Start: 2023-08-22 — End: 2023-09-16

## 2023-08-19 MED ORDER — LOSARTAN POTASSIUM 50 MG PO TABS
50.0000 mg | ORAL_TABLET | Freq: Every day | ORAL | 0 refills | Status: DC
Start: 2023-08-19 — End: 2023-09-16

## 2023-08-19 MED ORDER — BUPROPION HCL ER (SR) 200 MG PO TB12
ORAL_TABLET | ORAL | 0 refills | Status: DC
Start: 2023-08-19 — End: 2023-09-16

## 2023-08-19 NOTE — Progress Notes (Signed)
Bryan [name], D.O.  ABFM, ABOM Specializing in Clinical Bariatric Medicine  Office located at: [address] [place], [place]  [address]     Assessment and Plan:   Orders Placed This Encounter  Procedures   Vitamin B12   VITAMIN D 25 Hydroxy (Vit-D Deficiency, Fractures)   TSH   T4, free   Lipid panel   Magnesium   Insulin, random   Hemoglobin A1c   Comprehensive metabolic panel   CBC with Differential/Platelet    Medications Discontinued During This Encounter  Medication Reason   buPROPion (WELLBUTRIN SR) 150 MG 12 hr tablet    Vitamin D, Ergocalciferol, (DRISDOL) 1.25 MG (50000 UNIT) CAPS capsule Reorder   losartan (COZAAR) 50 MG tablet Reorder   metFORMIN (GLUCOPHAGE) 500 MG tablet Reorder     Meds ordered this encounter  Medications   Vitamin D, Ergocalciferol, (DRISDOL) 1.25 MG (50000 UNIT) CAPS capsule    Sig: Take 1 capsule (50,000 Units total) by mouth every Sunday.    Dispense:  4 capsule    Refill:  0    30 d supply;  ** OV for RF **   Do not send RF request   metFORMIN (GLUCOPHAGE) 500 MG tablet    Sig: TAKE 1 TABLET(500 MG) BY MOUTH THREE TIMES DAILY    Dispense:  90 tablet    Refill:  0   losartan (COZAAR) 50 MG tablet    Sig: Take 1 tablet (50 mg total) by mouth daily.    Dispense:  30 tablet    Refill:  0    30 d supply;  ** OV for RF **   Do not send RF request   buPROPion (WELLBUTRIN SR) 200 MG 12 hr tablet    Sig: 1 po BID    Dispense:  60 tablet    Refill:  0    30 d supply;  ** OV for RF **   Do not send RF request    Other hyperlipidemia Assessment & Plan: Lab Results  Component Value Date   CHOL 144 08/13/2022   HDL 44 08/13/2022   LDLCALC 66 08/13/2022   LDLDIRECT 136.0 11/16/2016   TRIG 205 (H) 08/13/2022   CHOLHDL 4 07/07/2019   Hyperlipidemia treated with Lipitor 20 mg daily - reports tolerating medication well, denies any adverse effects. C/w Lipitor per Dr.Paz & our treatment plan of a heart-heathy, low  cholesterol meal plan. Will recheck Lipid Panel today.    Vitamin D deficiency Assessment & Plan: Lab Results  Component Value Date   VD25OH 49.8 08/13/2022   VD25OH 55.45 09/15/2021   VD25OH 47.1 02/10/2021   Condition treated with ERGO 50K international units once a week. C/w their weight loss efforts and ERGO at current dose - Will refill ERGO today. Will recheck labs today.      Pre-diabetes Assessment & Plan: Lab Results  Component Value Date   HGBA1C 5.9 (H) 08/13/2022   HGBA1C 5.8 09/15/2021   INSULIN 9.7 08/13/2022   INSULIN 22.6 02/10/2021    Pre-diabetes treated with Metformin 500 mg tid. Reports tolerating medication well with occasional loose stools when eating off plan foods. Will refill Metformin today, no dose change. Maggie will continue to work on weight loss, exercise, via their meal plan we devised to help decrease the risk of progressing to diabetes.  Will recheck labs today.    Essential hypertension Assessment & Plan: BP Readings from Last 3 Encounters:  08/19/23 (!) 158/80  08/11/23 124/70  06/17/23 126/75   HTN treated with Hydrodiuril 25 mg daily and Cozaar 50 mg daily. BP elevated today - pt reports being under stress this morning and not taking his BP medication either. He is completely asymptomatic. At home, his blood pressure is stable and in the 120s/70s. C/w both antihypertensives at current doses - Will refill Cozaar today. Continue with Prudent nutritional plan and low sodium diet, advance exercise as tolerated.     Emotional Eating Behavior Assessment & Plan: Condition treated with Wellbutrin 150 mg bid. Reports that he has been stressed the past 4-6 wks carrying for his mom. His mom is 49 y.o and in poor health. Reports doing more emotional eating. Shared decision is to increase Wellbutrin to 200 mg bid. We also discussed increasing exercise/walking for stress management. Will continue to monitor condition.    Obesity, Current BMI  43.95 Obesity, Beginning BMI 46.78/Date 09/08/2017 Assessment & Plan: Since last office visit on 06/17/23 patient's muscle mass has decreased by 1.8 lb. Fat mass has increased by 3.8 lb. Total body water has decreased by 2 lb.  Counseling done on how various foods will affect these numbers and how to maximize success  Total lbs lost to date: -11 lbs  Total weight loss percentage to date: 3.37%  No change to meal plan - see Subjective    Behavioral Intervention Additional resources provided today:  NA Evidence-based interventions for health behavior change were utilized today including the discussion of self monitoring techniques, problem-solving barriers and SMART goal setting techniques.   Regarding patient's less desirable eating habits and patterns, we employed the technique of small changes.  Pt will specifically work on: NA  FOLLOW UP: Return in about 4 weeks (around 09/16/2023), or in 3-4 wks. He was informed of the importance of frequent follow up visits to maximize his success with intensive lifestyle modifications for his multiple health conditions.  [name] is aware that we will review all of his lab results at our next visit.  He is aware that if anything is critical/ life threatening with the results, we will be contacting him via MyChart prior to the office visit to discuss management.    Subjective:   Chief complaint: Obesity [name] is here to discuss his progress with his obesity treatment plan. He is keeping a food journal and adhering to recommended goals of 2,000-2,100 calories and 140+ protein and states he is following his eating plan approximately 50% of the time. He states he is walking 30-45 minutes 3 days per week.  Interval History:  SHALAMAR [name] is here for a follow up office visit. Since last OV, Bryan [name] has been doing okay. Reports that he has been stressed the past 4-6 wks carrying for his mom. His mom is 87 y.o and in poor health. Reports doing more  emotional eating and not journaling his intake. Has an upcoming vacation to [employer].   Pharmacotherapy for weight loss: He is currently taking  Metformin 500 mg tid &   for medical weight loss.  Denies side effects.    Review of Systems:  Pertinent positives were addressed with patient today.  Reviewed by clinician on day of visit: allergies, medications, problem list, medical history, surgical history, family history, social history, and previous encounter notes.  Weight Summary and Biometrics   Weight Lost Since Last Visit: 0  Weight Gained Since Last Visit: 2 lb   Vitals Temp: 97.9 F (36.6 C) BP: (!) 158/80 (Left Arm Manually) Pulse Rate: 77  SpO2: 96 %   Anthropometric Measurements Height: 5\' 11"  (1.803 m) Weight: (!) 315 lb (142.9 kg) BMI (Calculated): 43.95 Weight at Last Visit: 313 lb (06/17/23 Last in office visit) Weight Lost Since Last Visit: 0 Weight Gained Since Last Visit: 2 lb Starting Weight: 326 lb Total Weight Loss (lbs): 11 lb (4.99 kg)   Body Composition  Body Fat %: 40.2 % Fat Mass (lbs): 126.8 lbs Muscle Mass (lbs): 179.2 lbs Total Body Water (lbs): 135.8 lbs Visceral Fat Rating : 27   Other Clinical Data Fasting: Yes Labs: Yes Today's Visit #: 90 Starting Date: 09/08/17   Objective:   PHYSICAL EXAM: Blood pressure (!) 158/80, pulse 77, temperature 97.9 F (36.6 C), height 5\' 11"  (1.803 m), weight (!) 315 lb (142.9 kg), SpO2 96%. Body mass index is 43.93 kg/m.  General: Well Developed, well nourished, and in no acute distress.  HEENT: Normocephalic, atraumatic Skin: Warm and dry, cap RF less 2 sec, good turgor Chest:  Normal excursion, shape, no gross abn Respiratory: speaking in full sentences, no conversational dyspnea NeuroM-Sk: Ambulates w/o assistance, moves * 4 Psych: A and O *3, insight good, mood-full  DIAGNOSTIC DATA REVIEWED:  BMET    Component Value Date/Time   NA 141 08/13/2022 1302   K 4.3 08/13/2022 1302    CL 103 08/13/2022 1302   CO2 20 08/13/2022 1302   GLUCOSE 103 (H) 08/13/2022 1302   GLUCOSE 135 (H) 09/06/2021 1035   BUN 18 08/13/2022 1302   CREATININE 0.92 08/13/2022 1302   CALCIUM 9.2 08/13/2022 1302   GFRNONAA >60 09/06/2021 1035   GFRAA 96 05/29/2020 1417   Lab Results  Component Value Date   HGBA1C 5.9 (H) 08/13/2022   HGBA1C 5.9 08/15/2013   Lab Results  Component Value Date   INSULIN 9.7 08/13/2022   INSULIN 21.9 09/08/2017   Lab Results  Component Value Date   TSH 1.600 05/25/2021   CBC    Component Value Date/Time   WBC 7.9 12/31/2021 0809   WBC 11.8 (H) 09/06/2021 1035   RBC 5.33 12/31/2021 0809   RBC 5.94 (H) 09/06/2021 1035   HGB 15.4 12/31/2021 0809   HCT 44.8 12/31/2021 0809   PLT 301 12/31/2021 0809   MCV 84 12/31/2021 0809   MCH 28.9 12/31/2021 0809   MCH 28.5 09/06/2021 1035   MCHC 34.4 12/31/2021 0809   MCHC 33.9 09/06/2021 1035   RDW 13.3 12/31/2021 0809   Iron Studies No results found for: "IRON", "TIBC", "FERRITIN", "IRONPCTSAT" Lipid Panel     Component Value Date/Time   CHOL 144 08/13/2022 1302   TRIG 205 (H) 08/13/2022 1302   HDL 44 08/13/2022 1302   CHOLHDL 4 07/07/2019 0835   VLDL 38.2 07/07/2019 0835   LDLCALC 66 08/13/2022 1302   LDLDIRECT 136.0 11/16/2016 0902   Hepatic Function Panel     Component Value Date/Time   PROT 6.6 08/13/2022 1302   ALBUMIN 4.2 08/13/2022 1302   AST 20 08/13/2022 1302   ALT 28 08/13/2022 1302   ALKPHOS 75 08/13/2022 1302   BILITOT 0.4 08/13/2022 1302   BILIDIR 0.0 02/26/2012 1353      Component Value Date/Time   TSH 1.600 05/25/2021 1812   TSH 1.560 01/10/2020 1042   Nutritional Lab Results  Component Value Date   VD25OH 49.8 08/13/2022   VD25OH 55.45 09/15/2021   VD25OH 47.1 02/10/2021    Attestations:   I, Special Puri, acting as a Stage manager for [employer], DO., have compiled  all relevant documentation for today's office visit on behalf of Thomasene Lot, DO,  while in the presence of [employer], DO.  I have reviewed the above documentation for accuracy and completeness, and I agree with the above. Bryan [name], D.O.  The 21st Century Cures Act was signed into law in 2016 which includes the topic of electronic health records.  This provides immediate access to information in MyChart.  This includes consultation notes, operative notes, office notes, lab results and pathology reports.  If you have any questions about what you read please let us know at your next visit so we can discuss your concerns and take corrective action if need be.  We are right here with you.

## 2023-08-21 LAB — CBC WITH DIFFERENTIAL/PLATELET
Basophils Absolute: 0.1 10*3/uL (ref 0.0–0.2)
Basos: 1 %
EOS (ABSOLUTE): 0.1 10*3/uL (ref 0.0–0.4)
Eos: 2 %
Hematocrit: 46.1 % (ref 37.5–51.0)
Hemoglobin: 15.1 g/dL (ref 13.0–17.7)
Immature Grans (Abs): 0 10*3/uL (ref 0.0–0.1)
Immature Granulocytes: 0 %
Lymphocytes Absolute: 1.9 10*3/uL (ref 0.7–3.1)
Lymphs: 30 %
MCH: 29.2 pg (ref 26.6–33.0)
MCHC: 32.8 g/dL (ref 31.5–35.7)
MCV: 89 fL (ref 79–97)
Monocytes Absolute: 0.6 10*3/uL (ref 0.1–0.9)
Monocytes: 9 %
Neutrophils Absolute: 3.8 10*3/uL (ref 1.4–7.0)
Neutrophils: 58 %
Platelets: 239 10*3/uL (ref 150–450)
RBC: 5.17 x10E6/uL (ref 4.14–5.80)
RDW: 13.1 % (ref 11.6–15.4)
WBC: 6.5 10*3/uL (ref 3.4–10.8)

## 2023-08-21 LAB — INSULIN, RANDOM: INSULIN: 15.1 u[IU]/mL (ref 2.6–24.9)

## 2023-08-21 LAB — HEMOGLOBIN A1C
Est. average glucose Bld gHb Est-mCnc: 137 mg/dL
Hgb A1c MFr Bld: 6.4 % — ABNORMAL HIGH (ref 4.8–5.6)

## 2023-08-21 LAB — COMPREHENSIVE METABOLIC PANEL
ALT: 34 IU/L (ref 0–44)
AST: 22 IU/L (ref 0–40)
Albumin: 4.6 g/dL (ref 3.8–4.9)
Alkaline Phosphatase: 61 IU/L (ref 44–121)
BUN/Creatinine Ratio: 16 (ref 10–24)
BUN: 13 mg/dL (ref 8–27)
Bilirubin Total: 0.6 mg/dL (ref 0.0–1.2)
CO2: 23 mmol/L (ref 20–29)
Calcium: 9.4 mg/dL (ref 8.6–10.2)
Chloride: 98 mmol/L (ref 96–106)
Creatinine, Ser: 0.81 mg/dL (ref 0.76–1.27)
Globulin, Total: 2.9 g/dL (ref 1.5–4.5)
Glucose: 96 mg/dL (ref 70–99)
Potassium: 4 mmol/L (ref 3.5–5.2)
Sodium: 141 mmol/L (ref 134–144)
Total Protein: 7.5 g/dL (ref 6.0–8.5)
eGFR: 101 mL/min/{1.73_m2} (ref >59)

## 2023-08-21 LAB — VITAMIN B12: Vitamin B-12: 509 pg/mL (ref 232–1245)

## 2023-08-21 LAB — LIPID PANEL
Chol/HDL Ratio: 3.9 ratio (ref 0.0–5.0)
Cholesterol, Total: 182 mg/dL (ref 100–199)
HDL: 47 mg/dL (ref >39)
LDL Chol Calc (NIH): 89 mg/dL (ref 0–99)
Triglycerides: 277 mg/dL — ABNORMAL HIGH (ref 0–149)
VLDL Cholesterol Cal: 46 mg/dL — ABNORMAL HIGH (ref 5–40)

## 2023-08-21 LAB — TSH: TSH: 1.43 u[IU]/mL (ref 0.450–4.500)

## 2023-08-21 LAB — MAGNESIUM: Magnesium: 2.2 mg/dL (ref 1.6–2.3)

## 2023-08-21 LAB — VITAMIN D 25 HYDROXY (VIT D DEFICIENCY, FRACTURES): Vit D, 25-Hydroxy: 61.5 ng/mL (ref 30.0–100.0)

## 2023-08-21 LAB — T4, FREE: Free T4: 1.67 ng/dL (ref 0.82–1.77)

## 2023-09-06 ENCOUNTER — Telehealth: Payer: BC Managed Care – PPO | Admitting: Emergency Medicine

## 2023-09-06 DIAGNOSIS — U071 COVID-19: Secondary | ICD-10-CM

## 2023-09-06 MED ORDER — BENZONATATE 100 MG PO CAPS: 100.0000 mg | ORAL_CAPSULE | Freq: Two times a day (BID) | ORAL | 0 refills | Status: DC | PRN

## 2023-09-06 MED ORDER — MOLNUPIRAVIR EUA 200MG CAPSULE: 4.0000 | ORAL_CAPSULE | Freq: Two times a day (BID) | ORAL | 0 refills | Status: AC

## 2023-09-06 NOTE — Progress Notes (Signed)
Virtual Visit Consent   [name], you are scheduled for a virtual visit with a [name] provider today. Just as with appointments in the office, your consent must be obtained to participate. Your consent will be active for this visit and any virtual visit you may have with one of our providers in the next 365 days. If you have a MyChart account, a copy of this consent can be sent to you electronically.  As this is a virtual visit, video technology does not allow for your provider to perform a traditional examination. This may limit your provider's ability to fully assess your condition. If your provider identifies any concerns that need to be evaluated in person or the need to arrange testing (such as labs, EKG, etc.), we will make arrangements to do so. Although advances in technology are sophisticated, we cannot ensure that it will always work on either your end or our end. If the connection with a video visit is poor, the visit may have to be switched to a telephone visit. With either a video or telephone visit, we are not always able to ensure that we have a secure connection.  By engaging in this virtual visit, you consent to the provision of healthcare and authorize for your insurance to be billed (if applicable) for the services provided during this visit. Depending on your insurance coverage, you may receive a charge related to this service.  I need to obtain your verbal consent now. Are you willing to proceed with your visit today? [name] has provided verbal consent on 09/06/2023 for a virtual visit (video or telephone). Roxy Horseman, PA-C  Date: 09/06/2023 1:10 PM  Virtual Visit via Video Note   I, Roxy Horseman, connected with  Bryan [name]  ([id number], 25-Dec-1960) on 09/06/23 at  1:00 PM EDT by a video-enabled telemedicine application and verified that I am speaking with the correct person using two identifiers.  Location: Patient: Virtual Visit Location Patient:  Home Provider: Virtual Visit Location Provider: Home Office   I discussed the limitations of evaluation and management by telemedicine and the availability of in person appointments. The patient expressed understanding and agreed to proceed.    History of Present Illness: Bryan [name] is a 62 y.o. who identifies as a male who was assigned male at birth, and is being seen today for COVID.  States that his wife was diagnosed with the same.  States that he is a day or so behind her on symptoms.  Had been traveling abroad.  States that he has some body aches. Reports some chest congestion as well.  States that the last time he had COVID he had good success with Molnupiravir.  HPI: HPI  Problems:  Patient Active Problem List   Diagnosis Date Noted   BMI 40.0-44.9, adult (HCC)-current bmi 43.4 01/21/2023   Morbid obesity (HCC)-START BMI 46.78 01/21/2023   Benign neoplasm of ascending colon 12/15/2022   Personal history of colonic polyps 12/15/2022   High triglycerides 09/03/2022   Depression 05/23/2022   Polyphagia 11/12/2021   Atrial flutter (HCC) 05/30/2021   Prediabetes 10/20/2018   Left chronic serous otitis media 07/26/2018   Essential hypertension 01/05/2018   Vitamin D deficiency 01/05/2018   Other fatigue 09/08/2017   Right bundle branch block 09/08/2017   Class 3 severe obesity with serious comorbidity and body mass index (BMI) of 45.0 to 49.9 in adult [facility]) 09/08/2017   Urolithiasis    PCP NOTES >>>>>>>>>>>>>>>>>>>>>>>> 05/23/2016  Hyperglycemia 09/04/2014   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   Allergic rhinitis 02/27/2013   Insomnia 02/27/2013   Annual physical exam 02/26/2012   Hyperlipidemia 06/05/2008   Obstructive sleep apnea 06/05/2008    Allergies:  Allergies  Allergen Reactions   Lisinopril Cough   Medications:  Current Outpatient Medications:    benzonatate (TESSALON) 100 MG capsule, Take 1 capsule (100 mg total) by mouth 2 (two) times daily as needed for  cough., Disp: 20 capsule, Rfl: 0   molnupiravir EUA (LAGEVRIO) 200 mg CAPS capsule, Take 4 capsules (800 mg total) by mouth 2 (two) times daily for 5 days., Disp: 40 capsule, Rfl: 0   aspirin EC 81 MG tablet, Take 81 mg by mouth daily. Swallow whole., Disp: , Rfl:    atorvastatin (LIPITOR) 20 MG tablet, Take 1 tablet (20 mg total) by mouth at bedtime., Disp: 90 tablet, Rfl: 2   azelastine (ASTELIN) 0.1 % nasal spray, Place 2 sprays into both nostrils at bedtime as needed for rhinitis. Use in each nostril as directed, Disp: 30 mL, Rfl: 5   b complex vitamins capsule, Take 1 capsule by mouth daily., Disp: , Rfl:    benzonatate (TESSALON) 200 MG capsule, Take 1 capsule (200 mg total) by mouth 2 (two) times daily as needed for cough., Disp: 20 capsule, Rfl: 0   buPROPion (WELLBUTRIN SR) 200 MG 12 hr tablet, 1 po BID, Disp: 60 tablet, Rfl: 0   CO ENZYME Q-10 PO, Take 1 capsule by mouth in the morning., Disp: , Rfl:    diltiazem (CARDIZEM) 30 MG tablet, Take 1 - 2 tablets by mouth every 6 hours as needed for heart rate greater than 130, Disp: 30 tablet, Rfl: 0   famotidine-calcium carbonate-magnesium hydroxide (PEPCID COMPLETE) 10-800-165 MG chewable tablet, Chew 1 tablet by mouth daily as needed (acid reflux)., Disp: , Rfl:    fish oil-omega-3 fatty acids 1000 MG capsule, Take 1 g by mouth in the morning., Disp: , Rfl:    fluticasone (FLONASE) 50 MCG/ACT nasal spray, Place 2 sprays into both nostrils daily., Disp: 16 g, Rfl: 0   hydrochlorothiazide (HYDRODIURIL) 25 MG tablet, Take 1 tablet (25 mg total) by mouth daily., Disp: 90 tablet, Rfl: 1   ibuprofen (ADVIL) 200 MG tablet, Take 400 mg by mouth every 6 (six) hours as needed for moderate pain., Disp: , Rfl:    losartan (COZAAR) 50 MG tablet, Take 1 tablet (50 mg total) by mouth daily., Disp: 30 tablet, Rfl: 0   MELATONIN PO, Take 1 tablet by mouth at bedtime. With valerian root, Disp: , Rfl:    metFORMIN (GLUCOPHAGE) 500 MG tablet, TAKE 1 TABLET(500  MG) BY MOUTH THREE TIMES DAILY, Disp: 90 tablet, Rfl: 0   metoprolol succinate (TOPROL-XL) 25 MG 24 hr tablet, TAKE 1 TABLET(25 MG) BY MOUTH AT BEDTIME, Disp: 90 tablet, Rfl: 3   Multiple Vitamins-Minerals (CENTRUM PO), Take 1 tablet by mouth in the morning., Disp: , Rfl:    omeprazole (PRILOSEC OTC) 20 MG tablet, Take 20 mg by mouth daily as needed (acid reflux)., Disp: , Rfl:    Vitamin D, Ergocalciferol, (DRISDOL) 1.25 MG (50000 UNIT) CAPS capsule, Take 1 capsule (50,000 Units total) by mouth every Sunday., Disp: 4 capsule, Rfl: 0  Observations/Objective: Patient is well-developed, well-nourished in no acute distress.  Resting comfortably at home.  Head is normocephalic, atraumatic.  No labored breathing.  Speech is clear and coherent with logical content.  Patient is alert and oriented at baseline.  Assessment and Plan: 1. COVID   Meds ordered this encounter  Medications   molnupiravir EUA (LAGEVRIO) 200 mg CAPS capsule    Sig: Take 4 capsules (800 mg total) by mouth 2 (two) times daily for 5 days.    Dispense:  40 capsule    Refill:  0    Order Specific Question:   Supervising Provider    Answer:   Merrilee Jansky [[id number]]   benzonatate (TESSALON) 100 MG capsule    Sig: Take 1 capsule (100 mg total) by mouth 2 (two) times daily as needed for cough.    Dispense:  20 capsule    Refill:  0    Order Specific Question:   Supervising Provider    Answer:   Merrilee Jansky [id number]     Follow Up Instructions: I discussed the assessment and treatment plan with the patient. The patient was provided an opportunity to ask questions and all were answered. The patient agreed with the plan and demonstrated an understanding of the instructions.  A copy of instructions were sent to the patient via MyChart unless otherwise noted below.     The patient was advised to call back or seek an in-person evaluation if the symptoms worsen or if the condition fails to improve as  anticipated.  Time:  I spent 10 minutes with the patient via telehealth technology discussing the above problems/concerns.    Roxy Horseman, PA-C

## 2023-09-06 NOTE — Patient Instructions (Signed)
Bryan [name], thank you for joining Roxy Horseman, PA-C for today's virtual visit.  While this provider is not your primary care provider (PCP), if your PCP is located in our provider database this encounter information will be shared with them immediately following your visit.   A [facility] MyChart account gives you access to today's visit and all your visits, tests, and labs performed at [facility] " click here if you don't have a [facility] MyChart account or go to [email or web address]  Consent: (Patient) Bryan [name] provided verbal consent for this virtual visit at the beginning of the encounter.  Current Medications:  Current Outpatient Medications:    benzonatate (TESSALON) 100 MG capsule, Take 1 capsule (100 mg total) by mouth 2 (two) times daily as needed for cough., Disp: 20 capsule, Rfl: 0   molnupiravir EUA (LAGEVRIO) 200 mg CAPS capsule, Take 4 capsules (800 mg total) by mouth 2 (two) times daily for 5 days., Disp: 40 capsule, Rfl: 0   aspirin EC 81 MG tablet, Take 81 mg by mouth daily. Swallow whole., Disp: , Rfl:    atorvastatin (LIPITOR) 20 MG tablet, Take 1 tablet (20 mg total) by mouth at bedtime., Disp: 90 tablet, Rfl: 2   azelastine (ASTELIN) 0.1 % nasal spray, Place 2 sprays into both nostrils at bedtime as needed for rhinitis. Use in each nostril as directed, Disp: 30 mL, Rfl: 5   b complex vitamins capsule, Take 1 capsule by mouth daily., Disp: , Rfl:    benzonatate (TESSALON) 200 MG capsule, Take 1 capsule (200 mg total) by mouth 2 (two) times daily as needed for cough., Disp: 20 capsule, Rfl: 0   buPROPion (WELLBUTRIN SR) 200 MG 12 hr tablet, 1 po BID, Disp: 60 tablet, Rfl: 0   CO ENZYME Q-10 PO, Take 1 capsule by mouth in the morning., Disp: , Rfl:    diltiazem (CARDIZEM) 30 MG tablet, Take 1 - 2 tablets by mouth every 6 hours as needed for heart rate greater than 130, Disp: 30 tablet, Rfl: 0   famotidine-calcium carbonate-magnesium  hydroxide (PEPCID COMPLETE) 10-800-165 MG chewable tablet, Chew 1 tablet by mouth daily as needed (acid reflux)., Disp: , Rfl:    fish oil-omega-3 fatty acids 1000 MG capsule, Take 1 g by mouth in the morning., Disp: , Rfl:    fluticasone (FLONASE) 50 MCG/ACT nasal spray, Place 2 sprays into both nostrils daily., Disp: 16 g, Rfl: 0   hydrochlorothiazide (HYDRODIURIL) 25 MG tablet, Take 1 tablet (25 mg total) by mouth daily., Disp: 90 tablet, Rfl: 1   ibuprofen (ADVIL) 200 MG tablet, Take 400 mg by mouth every 6 (six) hours as needed for moderate pain., Disp: , Rfl:    losartan (COZAAR) 50 MG tablet, Take 1 tablet (50 mg total) by mouth daily., Disp: 30 tablet, Rfl: 0   MELATONIN PO, Take 1 tablet by mouth at bedtime. With valerian root, Disp: , Rfl:    metFORMIN (GLUCOPHAGE) 500 MG tablet, TAKE 1 TABLET(500 MG) BY MOUTH THREE TIMES DAILY, Disp: 90 tablet, Rfl: 0   metoprolol succinate (TOPROL-XL) 25 MG 24 hr tablet, TAKE 1 TABLET(25 MG) BY MOUTH AT BEDTIME, Disp: 90 tablet, Rfl: 3   Multiple Vitamins-Minerals (CENTRUM PO), Take 1 tablet by mouth in the morning., Disp: , Rfl:    omeprazole (PRILOSEC OTC) 20 MG tablet, Take 20 mg by mouth daily as needed (acid reflux)., Disp: , Rfl:    Vitamin D, Ergocalciferol, (DRISDOL) 1.25 MG (50000 UNIT) CAPS capsule,  Take 1 capsule (50,000 Units total) by mouth every Sunday., Disp: 4 capsule, Rfl: 0   Medications ordered in this encounter:  Meds ordered this encounter  Medications   molnupiravir EUA (LAGEVRIO) 200 mg CAPS capsule    Sig: Take 4 capsules (800 mg total) by mouth 2 (two) times daily for 5 days.    Dispense:  40 capsule    Refill:  0    Order Specific Question:   Supervising Provider    Answer:   Merrilee Jansky [[id number]]   benzonatate (TESSALON) 100 MG capsule    Sig: Take 1 capsule (100 mg total) by mouth 2 (two) times daily as needed for cough.    Dispense:  20 capsule    Refill:  0    Order Specific Question:   Supervising Provider     Answer:   Merrilee Jansky [id number]     *If you need refills on other medications prior to your next appointment, please contact your pharmacy*  Follow-Up: Call back or seek an in-person evaluation if the symptoms worsen or if the condition fails to improve as anticipated.  [facility] [phone]  Other Instructions    If you have been instructed to have an in-person evaluation today at a local Urgent Care facility, please use the link below. It will take you to a list of all of our available [facility], including address, phone number and hours of operation. Please do not delay care.  [facility]  If you or a family member do not have a primary care provider, use the link below to schedule a visit and establish care. When you choose a [facility] primary care physician or advanced practice provider, you gain a long-term partner in health. Find a Primary Care Provider  Learn more about [facility]'s in-office and virtual care options: [place] - Get Care Now

## 2023-09-09 ENCOUNTER — Other Ambulatory Visit (INDEPENDENT_AMBULATORY_CARE_PROVIDER_SITE_OTHER): Payer: Self-pay | Admitting: Family Medicine

## 2023-09-09 DIAGNOSIS — R7303 Prediabetes: Secondary | ICD-10-CM

## 2023-09-09 DIAGNOSIS — I10 Essential (primary) hypertension: Secondary | ICD-10-CM

## 2023-09-16 ENCOUNTER — Encounter (INDEPENDENT_AMBULATORY_CARE_PROVIDER_SITE_OTHER): Payer: Self-pay | Admitting: Family Medicine

## 2023-09-16 ENCOUNTER — Other Ambulatory Visit (INDEPENDENT_AMBULATORY_CARE_PROVIDER_SITE_OTHER): Payer: Self-pay | Admitting: Family Medicine

## 2023-09-16 ENCOUNTER — Ambulatory Visit (INDEPENDENT_AMBULATORY_CARE_PROVIDER_SITE_OTHER): Payer: BC Managed Care – PPO | Admitting: Family Medicine

## 2023-09-16 VITALS — BP 146/82 | HR 72 | Temp 98.4°F | Ht 71.0 in | Wt 315.0 lb

## 2023-09-16 DIAGNOSIS — E559 Vitamin D deficiency, unspecified: Secondary | ICD-10-CM

## 2023-09-16 DIAGNOSIS — Z6841 Body Mass Index (BMI) 40.0 and over, adult: Secondary | ICD-10-CM

## 2023-09-16 DIAGNOSIS — I10 Essential (primary) hypertension: Secondary | ICD-10-CM

## 2023-09-16 DIAGNOSIS — F5089 Other specified eating disorder: Secondary | ICD-10-CM

## 2023-09-16 DIAGNOSIS — E782 Mixed hyperlipidemia: Secondary | ICD-10-CM

## 2023-09-16 DIAGNOSIS — E785 Hyperlipidemia, unspecified: Secondary | ICD-10-CM

## 2023-09-16 DIAGNOSIS — E669 Obesity, unspecified: Secondary | ICD-10-CM

## 2023-09-16 DIAGNOSIS — F3289 Other specified depressive episodes: Secondary | ICD-10-CM

## 2023-09-16 MED ORDER — VITAMIN D (ERGOCALCIFEROL) 1.25 MG (50000 UNIT) PO CAPS
50000.0000 [IU] | ORAL_CAPSULE | ORAL | 0 refills | Status: DC
Start: 2023-09-19 — End: 2023-10-14

## 2023-09-16 MED ORDER — HYDROCHLOROTHIAZIDE 25 MG PO TABS
25.0000 mg | ORAL_TABLET | Freq: Every day | ORAL | 1 refills | Status: DC
Start: 2023-09-16 — End: 2023-11-17

## 2023-09-16 MED ORDER — LOSARTAN POTASSIUM 50 MG PO TABS
50.0000 mg | ORAL_TABLET | Freq: Every day | ORAL | 0 refills | Status: DC
Start: 2023-09-16 — End: 2023-10-14

## 2023-09-16 MED ORDER — BUPROPION HCL ER (SR) 200 MG PO TB12
ORAL_TABLET | ORAL | 0 refills | Status: DC
Start: 2023-09-16 — End: 2023-10-14

## 2023-09-16 NOTE — Progress Notes (Signed)
.smr  Office: [phone]  /  Fax: [phone]  WEIGHT SUMMARY AND BIOMETRICS  Anthropometric Measurements Height: 5\' 11"  (1.803 m) Weight: (!) 315 lb (142.9 kg) BMI (Calculated): 43.95 Weight at Last Visit: 315 lb Weight Lost Since Last Visit: 0 Weight Gained Since Last Visit: 0 Starting Weight: 326 lb Total Weight Loss (lbs): 11 lb (4.99 kg)   Body Composition  Body Fat %: 40.3 % Fat Mass (lbs): 127 lbs Muscle Mass (lbs): 178.8 lbs Total Body Water (lbs): 134.8 lbs Visceral Fat Rating : 27   Other Clinical Data Fasting: No Labs: No Today's Visit #: 91 Starting Date: 09/08/17    Chief Complaint: OBESITY   History of Present Illness   The patient, with a history of hypertension, vitamin D deficiency, depression, emotional eating behaviors, and obesity, presents for a routine follow-up. He reports elevated blood pressure readings of 146/76 and 146/82. He is currently on a regimen of vitamin D (50,000 IU/week), hydrochlorothiazide (25mg  daily), and losartan (50mg  daily), and requests refills for these medications.  The patient has maintained his weight since the last visit a month ago, and reports adherence to his category three plan approximately 50% of the time. He is not currently exercising due to recent recovery from COVID-19.  The patient describes a recent trip to [place], during which he contracted COVID-19. The primary symptoms were a chest cough and head congestion, with residual fatigue. He did not experience loss of taste or smell, or a sore throat. He was treated with an unspecified antiviral medication, which he reports helped with his symptoms.  The patient also reports ongoing nasal congestion, for which he has been using a neti pot and taking Sudafed. He suspects this may be contributing to his elevated blood pressure. He plans to monitor his blood pressure at home over the next week.  Despite recent illness, the patient has managed to maintain his weight.  He reports eating well during his trip to [place], with a focus on quality rather than quantity. He plans to resume exercise now that he is recovering from COVID-19.          PHYSICAL EXAM:  Blood pressure (!) 146/82, pulse 72, temperature 98.4 F (36.9 C), height 5\' 11"  (1.803 m), weight (!) 315 lb (142.9 kg), SpO2 98%. Body mass index is 43.93 kg/m.  DIAGNOSTIC DATA REVIEWED:  BMET    Component Value Date/Time   NA 141 08/19/2023 1443   K 4.0 08/19/2023 1443   CL 98 08/19/2023 1443   CO2 23 08/19/2023 1443   GLUCOSE 96 08/19/2023 1443   GLUCOSE 135 (H) 09/06/2021 1035   BUN 13 08/19/2023 1443   CREATININE 0.81 08/19/2023 1443   CALCIUM 9.4 08/19/2023 1443   GFRNONAA >60 09/06/2021 1035   GFRAA 96 05/29/2020 1417   Lab Results  Component Value Date   HGBA1C 6.4 (H) 08/19/2023   HGBA1C 5.9 08/15/2013   Lab Results  Component Value Date   INSULIN 15.1 08/19/2023   INSULIN 21.9 09/08/2017   Lab Results  Component Value Date   TSH 1.430 08/19/2023   CBC    Component Value Date/Time   WBC 6.5 08/19/2023 1443   WBC 11.8 (H) 09/06/2021 1035   RBC 5.17 08/19/2023 1443   RBC 5.94 (H) 09/06/2021 1035   HGB 15.1 08/19/2023 1443   HCT 46.1 08/19/2023 1443   PLT 239 08/19/2023 1443   MCV 89 08/19/2023 1443   MCH 29.2 08/19/2023 1443   MCH 28.5 09/06/2021 1035  MCHC 32.8 08/19/2023 1443   MCHC 33.9 09/06/2021 1035   RDW 13.1 08/19/2023 1443   Iron Studies No results found for: "IRON", "TIBC", "FERRITIN", "IRONPCTSAT" Lipid Panel     Component Value Date/Time   CHOL 182 08/19/2023 1443   TRIG 277 (H) 08/19/2023 1443   HDL 47 08/19/2023 1443   CHOLHDL 3.9 08/19/2023 1443   CHOLHDL 4 07/07/2019 0835   VLDL 38.2 07/07/2019 0835   LDLCALC 89 08/19/2023 1443   LDLDIRECT 136.0 11/16/2016 0902   Hepatic Function Panel     Component Value Date/Time   PROT 7.5 08/19/2023 1443   ALBUMIN 4.6 08/19/2023 1443   AST 22 08/19/2023 1443   ALT 34 08/19/2023 1443    ALKPHOS 61 08/19/2023 1443   BILITOT 0.6 08/19/2023 1443   BILIDIR 0.0 02/26/2012 1353      Component Value Date/Time   TSH 1.430 08/19/2023 1443   Nutritional Lab Results  Component Value Date   VD25OH 61.5 08/19/2023   VD25OH 49.8 08/13/2022   VD25OH 55.45 09/15/2021     Assessment and Plan    Hypertension Elevated blood pressure readings today (146/76 and 146/82). Patient is currently on Hydrochlorothiazide 25mg  daily and Losartan 50mg  daily. Recent illness (COVID) and use of Sudafed may be contributing to elevated readings. -Continue current antihypertensive medications. -Monitor blood pressure at home over the next couple of weeks.  Obesity and Emotional Eating Patient reports adherence to category three plan approximately 50% of the time. Recent illness (COVID) has limited exercise. -Encourage adherence to dietary plan and resumption of exercise as health improves.  Vitamin D Deficiency Currently on Vitamin D 50,000 international units per week. -Continue current Vitamin D supplementation.  Emotional Eating Behaviors Currently on Bupropion 150mg  twice daily, with plans to increase to 200mg  twice daily. -Increase Bupropion to 200mg  twice daily as planned.  Hyperlipidemia LDL slightly elevated at 89 (goal <70). Currently on Atorvastatin 20mg  daily. -Continue Atorvastatin 20mg  daily. -Monitor LDL levels and consider dose adjustment if necessary.  COVID-19 Recent infection with residual fatigue and congestion. -Continue supportive care and rest as needed.  Follow-up Next appointment scheduled with Dr. Val Eagle in early November. (4 weeks)        He was informed of the importance of frequent follow up visits to maximize his success with intensive lifestyle modifications for his multiple health conditions.    Quillian Quince, MD

## 2023-09-24 ENCOUNTER — Encounter: Payer: Self-pay | Admitting: Internal Medicine

## 2023-09-24 ENCOUNTER — Ambulatory Visit: Payer: BC Managed Care – PPO | Admitting: Internal Medicine

## 2023-09-24 VITALS — BP 134/78 | HR 67 | Temp 97.8°F | Resp 18 | Ht 71.0 in | Wt 322.0 lb

## 2023-09-24 DIAGNOSIS — E7849 Other hyperlipidemia: Secondary | ICD-10-CM

## 2023-09-24 DIAGNOSIS — Z0000 Encounter for general adult medical examination without abnormal findings: Secondary | ICD-10-CM

## 2023-09-24 MED ORDER — ATORVASTATIN CALCIUM 40 MG PO TABS: 40.0000 mg | ORAL_TABLET | Freq: Every day | ORAL | 0 refills | Status: DC

## 2023-09-24 NOTE — Patient Instructions (Addendum)
Increase atorvastatin to 40 mg daily.  Worsening a prescription  Vaccines I recommend: Flu shot in a couple of weeks Covid booster next month RSV vaccine    Go to the front desk: Arrange for blood work to be done in 6 weeks, fasting Next visit with me in 1 year, physical exam.

## 2023-09-24 NOTE — Progress Notes (Unsigned)
Subjective:    Patient ID: Bryan [name], male    DOB: 02/13/1961, 62 y.o.   MRN: [id number]  DOS:  09/24/2023 Type of visit - description: CPX  Here for CPX Had COVID diagnosed 09/06/2023, got molnupiravir, still has some residual chest congestion, but much improved overall.  Review of Systems See above   Past Medical History:  Diagnosis Date   Allergy    Dental crowns present    Difficult intubation    Hyperlipidemia    Hypertension    Lower back pain    Nephrolithiasis 2017   Prediabetes 09/04/2014   Seasonal allergies    Sebaceous cyst 04/2013   upper back   Sleep apnea    uses CPAP nightly   Urolithiasis 10/2016    Past Surgical History:  Procedure Laterality Date   A-FLUTTER ABLATION N/A 01/19/2022   Procedure: A-FLUTTER ABLATION;  Surgeon: Lanier Prude, MD;  Location: [facility] INVASIVE CV LAB;  Service: Cardiovascular;  Laterality: N/A;   ADENOIDECTOMY     as a child   bone marrow donor     under anesthesia   CHOLECYSTECTOMY  01/12/2012   Procedure: LAPAROSCOPIC CHOLECYSTECTOMY WITH INTRAOPERATIVE CHOLANGIOGRAM;  Surgeon: Wilmon Arms. Corliss Skains, MD;  Location: [facility];  Service: General;  Laterality: N/A;   COLONOSCOPY     COLONOSCOPY WITH PROPOFOL N/A 12/15/2022   Procedure: COLONOSCOPY WITH PROPOFOL;  Surgeon: Hilarie Fredrickson, MD;  Location: [facility];  Service: Gastroenterology;  Laterality: N/A;   CYST EXCISION     cyst removal from back   EAR CYST EXCISION N/A 05/04/2013   Procedure: Excision subcutaneous mass posterior neck;  Surgeon: Wilmon Arms. Corliss Skains, MD;  Location: [facility];  Service: General;  Laterality: N/A;   POLYPECTOMY     POLYPECTOMY  12/15/2022   Procedure: POLYPECTOMY;  Surgeon: Hilarie Fredrickson, MD;  Location: [facility];  Service: Gastroenterology;;   wisdom teeth      Current Outpatient Medications  Medication Instructions   aspirin EC 81 mg, Oral, Daily, Swallow whole.   atorvastatin (LIPITOR) 20 mg, Oral, Daily at bedtime    azelastine (ASTELIN) 0.1 % nasal spray 2 sprays, Each Nare, At bedtime PRN, Use in each nostril as directed    b complex vitamins capsule 1 capsule, Oral, Daily   benzonatate (TESSALON) 200 mg, Oral, 2 times daily PRN   benzonatate (TESSALON) 100 mg, Oral, 2 times daily PRN   buPROPion (WELLBUTRIN SR) 200 MG 12 hr tablet 1 po BID   CO ENZYME Q-10 PO 1 capsule, Oral, Every morning   diltiazem (CARDIZEM) 30 MG tablet Take 1 - 2 tablets by mouth every 6 hours as needed for heart rate greater than 130   famotidine-calcium carbonate-magnesium hydroxide (PEPCID COMPLETE) 10-800-165 MG chewable tablet 1 tablet, Oral, Daily PRN   fish oil-omega-3 fatty acids 1 g, Oral, Every morning   fluticasone (FLONASE) 50 MCG/ACT nasal spray 2 sprays, Each Nare, Daily   hydrochlorothiazide (HYDRODIURIL) 25 mg, Oral, Daily   ibuprofen (ADVIL) 400 mg, Oral, Every 6 hours PRN   losartan (COZAAR) 50 mg, Oral, Daily   MELATONIN PO 1 tablet, Oral, Daily at bedtime, With valerian root    metFORMIN (GLUCOPHAGE) 500 MG tablet TAKE 1 TABLET(500 MG) BY MOUTH THREE TIMES DAILY   metoprolol succinate (TOPROL-XL) 25 MG 24 hr tablet TAKE 1 TABLET(25 MG) BY MOUTH AT BEDTIME   Multiple Vitamins-Minerals (CENTRUM PO) 1 tablet, Oral, Every morning   omeprazole (PRILOSEC OTC) 20 mg, Oral, Daily  PRN   Vitamin D (Ergocalciferol) (DRISDOL) 50,000 Units, Oral, Every Sun       Objective:   Physical Exam BP 134/78   Pulse 67   Temp 97.8 F (36.6 C) (Oral)   Resp 18   Ht 5\' 11"  (1.803 m)   Wt (!) 322 lb (146.1 kg)   SpO2 96%   BMI 44.91 kg/m  General: Well developed, NAD, BMI noted Neck: No  thyromegaly  HEENT:  Normocephalic . Face symmetric, atraumatic Lungs:  CTA B Normal respiratory effort, no intercostal retractions, no accessory muscle use. Heart: RRR,  no murmur.  Abdomen:  Not distended, soft, non-tender. No rebound or rigidity.   Lower extremities: no pretibial edema bilaterally  Skin: Exposed areas without  rash. Not pale. Not jaundice Neurologic:  alert & oriented X3.  Speech normal, gait appropriate for age and unassisted Strength symmetric and appropriate for age.  Psych: Cognition and judgment appear intact.  Cooperative with normal attention span and concentration.  Behavior appropriate. No anxious or depressed appearing.     Assessment    Assessment Prediabetes HTN Hyperlipidemia CV: Atrial flutter, ablation 01-2022 --> NSR.  Eliquis discontinue June 2023. Insomnia: on OTCs Morbid obesity, BMI 46 OSA, CPAP Seasonal allergies Kidney stone (first) 10-2016 Vit D def dx 09/2017 Retina detachment DX 06-2019  PLAN: Here for CPX  -Td 09-2021 - s/p shingrex - PNM 23 : 2021 (h/o OSA, obesity) -Flu shot in 2 weeks (recent covid); covid booster next month; RSV -CCS : +FH Mat. Aunt had colon Ca Cscope  aprox  12/07/2002,  normal . cscope 2015- polyps, cscope 12/2022, nexr per GI  -Prostate ca screening: Will check a PSA - diet, exercise: sees the wellness clinic -Labs reviewed, see orders HTN.  BP is very good, no change. Hyperlipidemia: CV RF 10.4%, rec to increase atorvastatin to 40 mg daily, check labs in 6 weeks. History of A-fib: Asymptomatic, his smart watch does not alert him of any arrhythmias. OSA: Good CPAP compliance Morbid obesity: Follow-up at their wellness clinic RTC labs 6 weeks RTC CPX 1 year   == Morbid obesity: Working with a wellness center, on Wellbutrin, metformin, Saxenda. HTN: BP is great, continue Cardizem, losartan, metoprolol, HCTZ (refilled). Hyperlipidemia: On Lipitor, well-controlled. History of A-fib, on NSR since atrial fibrillation 01-2022, not anticoagulated, plans to reach out to cardiology for his follow-up appointment RTC 1 year  The 10-year ASCVD risk score (Arnett DK, et al., 2019) is: 10.4%   Values used to calculate the score:     Age: 8 years     Sex: Male     Is Non-Hispanic African American: No     Diabetic: No     Tobacco  smoker: No     Systolic Blood Pressure: 134 mmHg     Is BP treated: Yes     HDL Cholesterol: 47 mg/dL     Total Cholesterol: 182 mg/dL

## 2023-09-25 ENCOUNTER — Encounter: Payer: Self-pay | Admitting: Internal Medicine

## 2023-09-25 NOTE — Assessment & Plan Note (Signed)
Here for CPX  HTN.  BP is very good, no change. Hyperlipidemia: CV RF 10.4%, rec to increase atorvastatin to 40 mg daily, check labs in 6 weeks. History of A-fib: Asymptomatic,  smart watch w/ no arrhythmias. OSA: Good CPAP compliance Morbid obesity: Follow-up at their wellness clinic RTC labs 6 weeks RTC CPX 1 year

## 2023-09-25 NOTE — Assessment & Plan Note (Signed)
Here for CPX -Td 09-2021 - s/p shingrex - PNM 23 : 2021 (h/o OSA, obesity) -Flu shot in 2 weeks (recent covid); covid booster next month; RSV -CCS : +FH Mat. Aunt had colon Ca Cscope  2004,  normal . cscope 2015- polyps, cscope 12/2022, nexr per GI  -Prostate ca screening: Will check a PSA - diet, exercise: sees the wellness clinic -Labs reviewed, see orders

## 2023-10-04 ENCOUNTER — Other Ambulatory Visit: Payer: Self-pay | Admitting: Internal Medicine

## 2023-10-04 DIAGNOSIS — E7849 Other hyperlipidemia: Secondary | ICD-10-CM

## 2023-10-11 ENCOUNTER — Other Ambulatory Visit (INDEPENDENT_AMBULATORY_CARE_PROVIDER_SITE_OTHER): Payer: Self-pay | Admitting: Family Medicine

## 2023-10-11 DIAGNOSIS — I10 Essential (primary) hypertension: Secondary | ICD-10-CM

## 2023-10-14 ENCOUNTER — Ambulatory Visit (INDEPENDENT_AMBULATORY_CARE_PROVIDER_SITE_OTHER): Payer: BC Managed Care – PPO | Admitting: Family Medicine

## 2023-10-14 ENCOUNTER — Other Ambulatory Visit (INDEPENDENT_AMBULATORY_CARE_PROVIDER_SITE_OTHER): Payer: Self-pay | Admitting: Family Medicine

## 2023-10-14 ENCOUNTER — Encounter (INDEPENDENT_AMBULATORY_CARE_PROVIDER_SITE_OTHER): Payer: Self-pay | Admitting: Family Medicine

## 2023-10-14 VITALS — BP 140/83 | HR 76 | Temp 98.2°F | Ht 71.0 in | Wt 314.0 lb

## 2023-10-14 DIAGNOSIS — E559 Vitamin D deficiency, unspecified: Secondary | ICD-10-CM | POA: Diagnosis not present

## 2023-10-14 DIAGNOSIS — I10 Essential (primary) hypertension: Secondary | ICD-10-CM | POA: Diagnosis not present

## 2023-10-14 DIAGNOSIS — F3289 Other specified depressive episodes: Secondary | ICD-10-CM

## 2023-10-14 DIAGNOSIS — R7303 Prediabetes: Secondary | ICD-10-CM

## 2023-10-14 DIAGNOSIS — F5089 Other specified eating disorder: Secondary | ICD-10-CM

## 2023-10-14 DIAGNOSIS — E669 Obesity, unspecified: Secondary | ICD-10-CM

## 2023-10-14 DIAGNOSIS — Z6841 Body Mass Index (BMI) 40.0 and over, adult: Secondary | ICD-10-CM

## 2023-10-14 MED ORDER — BUPROPION HCL ER (SR) 200 MG PO TB12: ORAL_TABLET | ORAL | 0 refills | Status: DC

## 2023-10-14 MED ORDER — VITAMIN D (ERGOCALCIFEROL) 1.25 MG (50000 UNIT) PO CAPS: 50000.0000 [IU] | ORAL_CAPSULE | ORAL | 0 refills | Status: DC

## 2023-10-14 MED ORDER — LOSARTAN POTASSIUM 100 MG PO TABS: 100.0000 mg | ORAL_TABLET | Freq: Every day | ORAL | 0 refills | Status: DC

## 2023-10-14 NOTE — Progress Notes (Signed)
Bryan [name], D.O.  ABFM, ABOM Specializing in Clinical Bariatric Medicine  Office located at: [address] [place], [place]  [address]   Assessment and Plan:   FOR THE DISEASE OF OBESITY:  Since last office visit on 09/16/23 patient's  Muscle mass has increased by 1.4 lb. Fat mass has decreased by 2.4 lb. Total body water has increased by 1 lb.  Counseling done on how various foods will affect these numbers and how to maximize success  Total lbs lost to date: 12 lbs  Total weight loss percentage to date: 3.68%   Recommended Dietary Goals Bryan [name] is currently in the action stage of change. As such, his goal is to continue weight management plan.  He has agreed to: continue current plan  Behavioral Intervention We discussed the following Behavioral Modification Strategies today: increasing lean protein intake to established goals, high protein snacking choices, strategies for Thanksgiving.   Additional resources provided today: Handout on holiday eating strategies  Evidence-based interventions for health behavior change were utilized today including the discussion of self monitoring techniques, problem-solving barriers and SMART goal setting techniques.   Regarding patient's less desirable eating habits and patterns, we employed the technique of small changes.   Pt will specifically work on: increasing lean protein intake  Recommended Physical Activity Goals Bryan [name] has been advised to work up to 150 minutes of moderate intensity aerobic activity a week and strengthening exercises 2-3 times per week for cardiovascular health, weight loss maintenance and preservation of muscle mass.   He has agreed to :  Continue current level of physical activity   Pharmacotherapy We discussed various medication options to help Bryan [name] with his weight loss efforts and we both agreed to : continue with nutritional and behavioral strategies and continue current anti-obesity medication  regimen   FOR ASSOCIATED CONDITIONS ADDRESSED TODAY:  Pre-diabetes Assessment & Plan: Most recent Hemoglobin A1c of 6.4 on 08/19/23. Prediabetes treated with Metformin 500 mg tid- no adverse side effects.   Maintain with Metformin at current dose. I emphasized the importance of  weight loss, exercise, and following their meal plan. This strategy is essential for improving overall health, enhancing metabolic function, and ultimately lowering the likelihood of developing diabetes   Essential hypertension Assessment & Plan: Last 3 blood pressure readings in our office are as follows: BP Readings from Last 3 Encounters:  10/14/23 (!) 140/83  09/24/23 134/78  09/16/23 (!) 146/82   HTN treated with Hydrochlorothiazide 25 mg daily and Losartan 50 mg daily.  Blood pressure above target today. Pt is completely asymptomatic. At home, his blood pressure is sometimes in the 120s/80s and other times in the 140s/80s.   Continue with Hydrochlorothiazide at current dose. Will increase Losartan to 100 mg daily. Check blood pressure at home every day or every other day. Continue with adequate hydration and low sodium diet, advance exercise as tolerated.   Orders: -   Losartan Potassium; Take 1 tablet (100 mg total) by mouth daily.  Dispense: 30 tablet; Refill: 0   Emotional Eating Behavior Assessment & Plan: At last OV, pt was increased from Bupropion 150 mg twice daily to 200 mg twice daily. Pt tolerating medication well, denies any SE. He feels that the medication is helping with emotional cravings; he notes doing less emotional eating.   Continue with Wellbutrin at current dose. Reminded patient of the importance of following their prudent nutrition plan and how food can affect mood as well to support emotional wellbeing.   Orders: -  buPROPion HCl ER (SR); 1 po BID  Dispense: 60 tablet; Refill: 0   Vitamin D deficiency Assessment & Plan: Pt is currently taking ERGO 50,000 units once a  week, no adverse effects. Most recent vit D of 61.5 on 08/19/23.   Continue with weight loss efforts and ERGO at current dose. Vitamin D; recheck future.   Orders: -   Vitamin D (Ergocalciferol); Take 1 capsule (50,000 Units total) by mouth every Sunday.  Dispense: 4 capsule; Refill: 0   Obesity with current BMI 43.81 Obesity, Beginning BMI 46.78/Date 09/08/2017 Assessment & Plan: See obesity treatment note.    FOLLOW UP: Return 11/17/23. He was informed of the importance of frequent follow up visits to maximize his success with intensive lifestyle modifications for his multiple health conditions.  Subjective:   Chief complaint: Obesity Bryan [name] is here to discuss his progress with his obesity treatment plan. He is on the Category 3 Plan and states he is following his eating plan approximately 65% of the time. He states he is walking 45 minutes 3 days per week.  Interval History:  Bryan [name] is here for a follow up office visit. Since last OV,  Bryan [name] is down 1 lb. The patient describes a recent trip to [place], during which he contracted COVID-19. His symptoms have resolved now. He has been back on his meal plan the past 3-4 weeks. He feels that he's been much more focused on his protein intake; however, he notes that it's sometimes difficult to get in all his protein at night.   Pharmacotherapy for weight loss: He is currently taking Metformin 500 mg tid & Wellbutrin SR 200 mg bid.  Review of Systems:  Pertinent positives were addressed with patient today.  Reviewed by clinician on day of visit: allergies, medications, problem list, medical history, surgical history, family history, social history, and previous encounter notes.  Weight Summary and Biometrics   Weight Lost Since Last Visit: 1lb  Weight Gained Since Last Visit: 0lb   Vitals Temp: 98.2 F (36.8 C) BP: (!) 140/83 Pulse Rate: 76 SpO2: 98 %   Anthropometric Measurements Height: 5\' 11"  (1.803 m) Weight:  (!) 314 lb (142.4 kg) BMI (Calculated): 43.81 Weight at Last Visit: 315lb Weight Lost Since Last Visit: 1lb Weight Gained Since Last Visit: 0lb Starting Weight: 326lb Total Weight Loss (lbs): 12 lb (5.443 kg)   Body Composition  Body Fat %: 39.7 % Fat Mass (lbs): 124.6 lbs Muscle Mass (lbs): 180.2 lbs Total Body Water (lbs): 135.8 lbs Visceral Fat Rating : 27   Other Clinical Data Fasting: no Labs: no Today's Visit #: 92 Starting Date: 09/08/17   Objective:   PHYSICAL EXAM: Blood pressure (!) 140/83, pulse 76, temperature 98.2 F (36.8 C), height 5\' 11"  (1.803 m), weight (!) 314 lb (142.4 kg), SpO2 98%. Body mass index is 43.79 kg/m.  General: he is overweight, cooperative and in no acute distress. PSYCH: Has normal mood, affect and thought process.   HEENT: EOMI, sclerae are anicteric. Lungs: Normal breathing effort, no conversational dyspnea. Extremities: Moves * 4 Neurologic: A and O * 3, good insight  DIAGNOSTIC DATA REVIEWED: BMET    Component Value Date/Time   NA 141 08/19/2023 1443   K 4.0 08/19/2023 1443   CL 98 08/19/2023 1443   CO2 23 08/19/2023 1443   GLUCOSE 96 08/19/2023 1443   GLUCOSE 135 (H) 09/06/2021 1035   BUN 13 08/19/2023 1443   CREATININE 0.81 08/19/2023 1443   CALCIUM 9.4 08/19/2023 1443  GFRNONAA >60 09/06/2021 1035   GFRAA 96 05/29/2020 1417   Lab Results  Component Value Date   HGBA1C 6.4 (H) 08/19/2023   HGBA1C 5.9 08/15/2013   Lab Results  Component Value Date   INSULIN 15.1 08/19/2023   INSULIN 21.9 09/08/2017   Lab Results  Component Value Date   TSH 1.430 08/19/2023   CBC    Component Value Date/Time   WBC 6.5 08/19/2023 1443   WBC 11.8 (H) 09/06/2021 1035   RBC 5.17 08/19/2023 1443   RBC 5.94 (H) 09/06/2021 1035   HGB 15.1 08/19/2023 1443   HCT 46.1 08/19/2023 1443   PLT 239 08/19/2023 1443   MCV 89 08/19/2023 1443   MCH 29.2 08/19/2023 1443   MCH 28.5 09/06/2021 1035   MCHC 32.8 08/19/2023 1443   MCHC  33.9 09/06/2021 1035   RDW 13.1 08/19/2023 1443   Iron Studies No results found for: "IRON", "TIBC", "FERRITIN", "IRONPCTSAT" Lipid Panel     Component Value Date/Time   CHOL 182 08/19/2023 1443   TRIG 277 (H) 08/19/2023 1443   HDL 47 08/19/2023 1443   CHOLHDL 3.9 08/19/2023 1443   CHOLHDL 4 07/07/2019 0835   VLDL 38.2 07/07/2019 0835   LDLCALC 89 08/19/2023 1443   LDLDIRECT 136.0 11/16/2016 0902   Hepatic Function Panel     Component Value Date/Time   PROT 7.5 08/19/2023 1443   ALBUMIN 4.6 08/19/2023 1443   AST 22 08/19/2023 1443   ALT 34 08/19/2023 1443   ALKPHOS 61 08/19/2023 1443   BILITOT 0.6 08/19/2023 1443   BILIDIR 0.0 02/26/2012 1353      Component Value Date/Time   TSH 1.430 08/19/2023 1443   Nutritional Lab Results  Component Value Date   VD25OH 61.5 08/19/2023   VD25OH 49.8 08/13/2022   VD25OH 55.45 09/15/2021    Attestations:   I, Special Puri, acting as a Stage manager for Thomasene Lot, DO., have compiled all relevant documentation for today's office visit on behalf of Thomasene Lot, DO, while in the presence of [employer], DO.  Reviewed by clinician on day of visit: allergies, medications, problem list, medical history, surgical history, family history, social history, and previous encounter notes pertinent to patient's obesity diagnosis. I have spent 49 minutes in the care of the patient today including: preparing to see patient (e.g. review and interpretation of tests, old notes ), obtaining and/or reviewing separately obtained history, performing a medically appropriate examination or evaluation, counseling and educating the patient, ordering medications, test or procedures, documenting clinical information in the electronic or other health care record, and independently interpreting results and communicating results to the patient, family, or caregiver   I have reviewed the above documentation for accuracy and completeness, and I agree  with the above. Bryan [name], D.O.  The 21st Century Cures Act was signed into law in 2016 which includes the topic of electronic health records.  This provides immediate access to information in MyChart.  This includes consultation notes, operative notes, office notes, lab results and pathology reports.  If you have any questions about what you read please let us know at your next visit so we can discuss your concerns and take corrective action if need be.  We are right here with you.

## 2023-10-26 NOTE — Progress Notes (Unsigned)
Cardiology Office Note Date:  10/26/2023  Patient ID:  Bryan [name], Bryan [name] 10/22/1961, MRN [id number] PCP:  Wanda Plump, MD  Electrophysiologist: Dr. Lalla Brothers    Chief Complaint:  6 mo  History of Present Illness: Bryan [name] is a 62 y.o. male with history of OSA, HTN, AFlutter, RBBB, obesity  Referred to Dr. Lalla Brothers via [facility] clinic, 10/14/21, he was working on weight loss, following at [facility], with success. Planned for ablation  CTI ablation 01/19/22  Saw Dr. Lalla Brothers 02/17/22, no recurrent symptoms of his AFlutter, planned to stop his OAC after 3 mo (May 13) if no recurrent arrhythmia.  Discussed recommendation to establish rhythm monitoring strategy via watch, loop.   C/w [facility], walking for exercise.[name] TOTAL lost 10lbs by their last note  I saw him 11/04/22 He is doing well No symptoms or watch alerts to AFlutter, elevated HRs No CP,SOB No near syncope or syncope. Had an independent carotid US for screening, reportedly was OK PMD manages his lipitor/statin No changes were made Encouraged ongoing weight loss efforts/exercise  *** symptoms, burden *** labs,lipids... *** risk remains one   AF/AAD Hx AFlutter noted 05/25/21 CTI ablation 01/19/22  Past Medical History:  Diagnosis Date   Allergy    Dental crowns present    Difficult intubation    Hyperlipidemia    Hypertension    Lower back pain    Nephrolithiasis 2017   Prediabetes 09/04/2014   Seasonal allergies    Sebaceous cyst 04/2013   upper back   Sleep apnea    uses CPAP nightly   Urolithiasis 10/2016    Past Surgical History:  Procedure Laterality Date   A-FLUTTER ABLATION N/A 01/19/2022   Procedure: A-FLUTTER ABLATION;  Surgeon: Lanier Prude, MD;  Location: [facility] INVASIVE CV LAB;  Service: Cardiovascular;  Laterality: N/A;   ADENOIDECTOMY     as a child   bone marrow donor     under anesthesia   CHOLECYSTECTOMY  01/12/2012   Procedure: LAPAROSCOPIC  CHOLECYSTECTOMY WITH INTRAOPERATIVE CHOLANGIOGRAM;  Surgeon: Wilmon Arms. Corliss Skains, MD;  Location: [facility] ORS;  Service: General;  Laterality: N/A;   COLONOSCOPY     COLONOSCOPY WITH PROPOFOL N/A 12/15/2022   Procedure: COLONOSCOPY WITH PROPOFOL;  Surgeon: Hilarie Fredrickson, MD;  Location: [facility];  Service: Gastroenterology;  Laterality: N/A;   CYST EXCISION     cyst removal from back   EAR CYST EXCISION N/A 05/04/2013   Procedure: Excision subcutaneous mass posterior neck;  Surgeon: Wilmon Arms. Corliss Skains, MD;  Location: [facility];  Service: General;  Laterality: N/A;   POLYPECTOMY     POLYPECTOMY  12/15/2022   Procedure: POLYPECTOMY;  Surgeon: Hilarie Fredrickson, MD;  Location: [facility];  Service: Gastroenterology;;   wisdom teeth      Current Outpatient Medications  Medication Sig Dispense Refill   aspirin EC 81 MG tablet Take 81 mg by mouth daily. Swallow whole.     atorvastatin (LIPITOR) 40 MG tablet Take 1 tablet (40 mg total) by mouth at bedtime. 90 tablet 0   azelastine (ASTELIN) 0.1 % nasal spray Place 2 sprays into both nostrils at bedtime as needed for rhinitis. Use in each nostril as directed 30 mL 5   b complex vitamins capsule Take 1 capsule by mouth daily.     benzonatate (TESSALON) 100 MG capsule Take 1 capsule (100 mg total) by mouth 2 (two) times daily as needed for cough. 20 capsule 0  benzonatate (TESSALON) 200 MG capsule Take 1 capsule (200 mg total) by mouth 2 (two) times daily as needed for cough. 20 capsule 0   buPROPion (WELLBUTRIN SR) 200 MG 12 hr tablet 1 po BID 60 tablet 0   CO ENZYME Q-10 PO Take 1 capsule by mouth in the morning.     diltiazem (CARDIZEM) 30 MG tablet Take 1 - 2 tablets by mouth every 6 hours as needed for heart rate greater than 130 30 tablet 0   famotidine-calcium carbonate-magnesium hydroxide (PEPCID COMPLETE) 10-800-165 MG chewable tablet Chew 1 tablet by mouth daily as needed (acid reflux).     fish oil-omega-3 fatty acids 1000 MG capsule  Take 1 g by mouth in the morning.     fluticasone (FLONASE) 50 MCG/ACT nasal spray Place 2 sprays into both nostrils daily. 16 g 0   hydrochlorothiazide (HYDRODIURIL) 25 MG tablet Take 1 tablet (25 mg total) by mouth daily. 90 tablet 1   ibuprofen (ADVIL) 200 MG tablet Take 400 mg by mouth every 6 (six) hours as needed for moderate pain.     losartan (COZAAR) 100 MG tablet Take 1 tablet (100 mg total) by mouth daily. 30 tablet 0   MELATONIN PO Take 1 tablet by mouth at bedtime. With valerian root     metFORMIN (GLUCOPHAGE) 500 MG tablet TAKE 1 TABLET(500 MG) BY MOUTH THREE TIMES DAILY 90 tablet 0   metoprolol succinate (TOPROL-XL) 25 MG 24 hr tablet TAKE 1 TABLET(25 MG) BY MOUTH AT BEDTIME 90 tablet 3   Multiple Vitamins-Minerals (CENTRUM PO) Take 1 tablet by mouth in the morning.     omeprazole (PRILOSEC OTC) 20 MG tablet Take 20 mg by mouth daily as needed (acid reflux).     Vitamin D, Ergocalciferol, (DRISDOL) 1.25 MG (50000 UNIT) CAPS capsule Take 1 capsule (50,000 Units total) by mouth every Sunday. 4 capsule 0   No current facility-administered medications for this visit.    Allergies:   Lisinopril   Social History:  The patient  reports that he has never smoked. He has never been exposed to tobacco smoke. He has never used smokeless tobacco. He reports current alcohol use of about 3.0 - 4.0 standard drinks of alcohol per week. He reports that he does not use drugs.   Family History:  The patient's family history includes Cancer in his brother and paternal grandmother; Cancer (age of onset: 8) in his father; Colon cancer (age of onset: 49) in his maternal aunt; Diabetes in his brother; Hyperlipidemia in his mother; Hypertension in his mother; Kidney disease in his father; Obesity in his mother; Ovarian cancer in his mother; Sleep apnea in his father.  ROS:  Please see the history of present illness.    All other systems are reviewed and otherwise negative.   PHYSICAL EXAM:  VS:  There  were no vitals taken for this visit. BMI: There is no height or weight on file to calculate BMI. Well nourished, well developed, in no acute distress HEENT: normocephalic, atraumatic Neck: no JVD, carotid bruits or masses Cardiac:  *** RRR; no significant murmurs, no rubs, or gallops Lungs: ***  CTA b/l, no wheezing, rhonchi or rales Abd: soft, nontender MS: no deformity or  atrophy Ext: *** no edema Skin: warm and dry, no rash Neuro:  No gross deficits appreciated Psych: euthymic mood, full affect   EKG:  done today and reviewed by myself ***   01/19/22: EPS/ablation CONCLUSIONS:   1.  Symptomatic typical atrial flutter  2. Successful radiofrequency ablation of atrial flutter along the cavotricuspid isthmus with complete bidirectional isthmus block achieved.   3. No inducible arrhythmias following ablation.   4. No early apparent complications.   01/12/22: Cardiac CT IMPRESSION: 1. There is normal pulmonary vein drainage into the left atrium with ostial measurements above.   2. There is no thrombus in the left atrial appendage but significant noise artifact limits sensitivity of this study for identifying thrombus. Consider TEE for further evaulation.   3. The esophagus runs in the left atrial midline and is not in proximity to any of the pulmonary vein ostia.   4. No obvious PFO/ASD but significant noise artifact limits sensitivity of study.   5. Normal coronary origin. Right dominance.   6. CAC score of 0 which is 0 percentile for age-, race-, and sex-matched controls.  Recent Labs: 08/19/2023: ALT 34; BUN 13; Creatinine, Ser 0.81; Hemoglobin 15.1; Magnesium 2.2; Platelets 239; Potassium 4.0; Sodium 141; TSH 1.430  08/19/2023: Chol/HDL Ratio 3.9; Cholesterol, Total 182; HDL 47; LDL Chol Calc (NIH) 89; Triglycerides 277   CrCl cannot be calculated (Patient's most recent lab result is older than the maximum 21 days allowed.).   Wt Readings from Last 3 Encounters:   10/14/23 (!) 314 lb (142.4 kg)  09/24/23 (!) 322 lb (146.1 kg)  09/16/23 (!) 315 lb (142.9 kg)     Other studies reviewed: Additional studies/records reviewed today include: summarized above  ASSESSMENT AND PLAN:  AFlutter (typical) S/p ablation Off a/c *** No symptoms or alerts of arrhythmia Has an Apple watch for rhythm/HR surveillance   HTN *** Encouraged weight loss  Obesity *** Continues with the weight/wellness center   Disposition: ***  Current medicines are reviewed at length with the patient today.  The patient did not have any concerns regarding medicines.  Norma Fredrickson, PA-C 10/26/2023 4:23 PM     [facility] [address] [place] [address] [phone] (office)  [phone] (fax)

## 2023-11-01 ENCOUNTER — Ambulatory Visit: Payer: BC Managed Care – PPO | Attending: Physician Assistant | Admitting: Physician Assistant

## 2023-11-01 ENCOUNTER — Encounter: Payer: Self-pay | Admitting: Physician Assistant

## 2023-11-01 VITALS — BP 122/76 | HR 70 | Ht 71.0 in | Wt 325.4 lb

## 2023-11-01 DIAGNOSIS — I483 Typical atrial flutter: Secondary | ICD-10-CM | POA: Diagnosis not present

## 2023-11-01 DIAGNOSIS — I10 Essential (primary) hypertension: Secondary | ICD-10-CM

## 2023-11-01 NOTE — Patient Instructions (Signed)
Medication Instructions:  Your physician recommends that you continue on your current medications as directed. Please refer to the Current Medication list given to you today.  *If you need a refill on your cardiac medications before your next appointment, please call your pharmacy*  Follow-Up: At [facility], you and your health needs are our priority.  As part of our continuing mission to provide you with exceptional heart care, we have created designated Provider Care Teams.  These Care Teams include your primary Cardiologist (physician) and Advanced Practice Providers (APPs -  Physician Assistants and Nurse Practitioners) who all work together to provide you with the care you need, when you need it.  Your next appointment:   1 year  Provider:   You may see CAMERON T LAMBERT, MD or one of the following Advanced Practice Providers on your designated Care Team:   Renee Ursuy, PA-C Michael "Andy" Tillery, PA-C Suzann Riddle, NP Brandi Ollis, NP   

## 2023-11-03 ENCOUNTER — Other Ambulatory Visit (INDEPENDENT_AMBULATORY_CARE_PROVIDER_SITE_OTHER): Payer: BC Managed Care – PPO

## 2023-11-03 DIAGNOSIS — Z0000 Encounter for general adult medical examination without abnormal findings: Secondary | ICD-10-CM | POA: Diagnosis not present

## 2023-11-03 DIAGNOSIS — E7849 Other hyperlipidemia: Secondary | ICD-10-CM | POA: Diagnosis not present

## 2023-11-03 LAB — PSA: PSA: 0.68 ng/mL (ref 0.10–4.00)

## 2023-11-03 LAB — LIPID PANEL
Cholesterol: 141 mg/dL (ref 0–200)
HDL: 39 mg/dL — ABNORMAL LOW (ref >39.00)
LDL Cholesterol: 50 mg/dL (ref 0–99)
NonHDL: 101.98
Total CHOL/HDL Ratio: 4
Triglycerides: 260 mg/dL — ABNORMAL HIGH (ref 0.0–149.0)
VLDL: 52 mg/dL — ABNORMAL HIGH (ref 0.0–40.0)

## 2023-11-03 LAB — ALT: ALT: 40 U/L (ref 0–53)

## 2023-11-03 LAB — AST: AST: 19 U/L (ref 0–37)

## 2023-11-08 MED ORDER — ATORVASTATIN CALCIUM 40 MG PO TABS: 40.0000 mg | ORAL_TABLET | Freq: Every day | ORAL | 3 refills | Status: DC

## 2023-11-08 NOTE — Addendum Note (Signed)
Addended byConrad Grangeville D on: 11/08/2023 07:45 AM   Modules accepted: Orders

## 2023-11-17 ENCOUNTER — Ambulatory Visit (INDEPENDENT_AMBULATORY_CARE_PROVIDER_SITE_OTHER): Payer: BC Managed Care – PPO | Admitting: Family Medicine

## 2023-11-17 ENCOUNTER — Encounter (INDEPENDENT_AMBULATORY_CARE_PROVIDER_SITE_OTHER): Payer: Self-pay | Admitting: Family Medicine

## 2023-11-17 ENCOUNTER — Other Ambulatory Visit (INDEPENDENT_AMBULATORY_CARE_PROVIDER_SITE_OTHER): Payer: Self-pay | Admitting: Family Medicine

## 2023-11-17 VITALS — BP 153/72 | HR 72 | Temp 98.0°F | Ht 71.0 in | Wt 318.0 lb

## 2023-11-17 DIAGNOSIS — Z6841 Body Mass Index (BMI) 40.0 and over, adult: Secondary | ICD-10-CM

## 2023-11-17 DIAGNOSIS — I10 Essential (primary) hypertension: Secondary | ICD-10-CM

## 2023-11-17 DIAGNOSIS — E559 Vitamin D deficiency, unspecified: Secondary | ICD-10-CM | POA: Diagnosis not present

## 2023-11-17 DIAGNOSIS — F3289 Other specified depressive episodes: Secondary | ICD-10-CM

## 2023-11-17 DIAGNOSIS — F5089 Other specified eating disorder: Secondary | ICD-10-CM | POA: Diagnosis not present

## 2023-11-17 DIAGNOSIS — E119 Type 2 diabetes mellitus without complications: Secondary | ICD-10-CM | POA: Insufficient documentation

## 2023-11-17 DIAGNOSIS — R7303 Prediabetes: Secondary | ICD-10-CM | POA: Diagnosis not present

## 2023-11-17 DIAGNOSIS — E669 Obesity, unspecified: Secondary | ICD-10-CM

## 2023-11-17 MED ORDER — BUPROPION HCL ER (SR) 200 MG PO TB12: ORAL_TABLET | ORAL | 0 refills | Status: DC

## 2023-11-17 MED ORDER — VITAMIN D (ERGOCALCIFEROL) 1.25 MG (50000 UNIT) PO CAPS: 50000.0000 [IU] | ORAL_CAPSULE | ORAL | 0 refills | Status: DC

## 2023-11-17 MED ORDER — LOSARTAN POTASSIUM 100 MG PO TABS: 100.0000 mg | ORAL_TABLET | Freq: Every day | ORAL | 0 refills | Status: DC

## 2023-11-17 MED ORDER — METFORMIN HCL 500 MG PO TABS: ORAL_TABLET | ORAL | 0 refills | Status: DC

## 2023-11-17 MED ORDER — HYDROCHLOROTHIAZIDE 25 MG PO TABS: 25.0000 mg | ORAL_TABLET | Freq: Every day | ORAL | 0 refills | Status: DC

## 2023-11-17 NOTE — Progress Notes (Signed)
.[facility]  Office: [phone]  /  Fax: [phone]  WEIGHT SUMMARY AND BIOMETRICS  Anthropometric Measurements Height: 5\' 11"  (1.803 m) Weight: (!) 318 lb (144.2 kg) BMI (Calculated): 44.37 Weight at Last Visit: 314 lb Weight Lost Since Last Visit: 0 Weight Gained Since Last Visit: 4 lb Starting Weight: 326 lb Total Weight Loss (lbs): 8 lb (3.629 kg)   Body Composition  Body Fat %: 41.5 % Fat Mass (lbs): 132.4 lbs Muscle Mass (lbs): 177.2 lbs Total Body Water (lbs): 140.4 lbs Visceral Fat Rating : 28   Other Clinical Data Fasting: No Labs: No Today's Visit #: 80 Starting Date: 09/08/17    Chief Complaint: OBESITY   History of Present Illness   The patient, with a history of hypertension, prediabetes, vitamin D deficiency, emotional eating behaviors, and obesity, presents for a routine follow-up. He reports an elevated blood pressure reading of 153/72, despite being on a regimen of diltiazem, hydrochlorothiazide, losartan, and metoprolol. The patient's blood pressure control has been inconsistent, with periods of well-controlled readings. He requests refills of hydrochlorothiazide, losartan, and metformin, which he is taking for prediabetes.  The patient is also on bupropion SR 200mg  twice daily for emotional eating behaviors and vitamin D 50,000 international units per week for vitamin D deficiency. He reports a weight gain of four pounds since the last visit and adherence to his category three eating plan about 50% of the time. He has been engaging in physical activity, specifically walking on a treadmill for about 30 minutes twice a week.  The patient reports increased stress due to work and the holiday season, which he believes may be contributing to his elevated blood pressure. He has been adhering to his medication regimen, with the exception of vitamin D, which he has run out of. He reports no new health issues.  The patient has been making efforts to manage his diet,  including looking ahead at [employer] before dining out, cooking at home, and taking leftovers for lunch. He reports a recent increase in fluid intake, which he believes may be contributing to his recent weight gain. He also reports a decrease in physical activity, which he attributes to a recent time shift and increased work stress.  The patient's recent lab results indicate stable PSA levels and improved LDL levels. However, his good cholesterol levels have fallen, and his triglycerides remain high despite fasting. The patient plans to increase his physical activity and continue working on his diet to manage his health conditions.          PHYSICAL EXAM:  Blood pressure (!) 153/72, pulse 72, temperature 98 F (36.7 C), height 5\' 11"  (1.803 m), weight (!) 318 lb (144.2 kg), SpO2 96%. Body mass index is 44.35 kg/m.  DIAGNOSTIC DATA REVIEWED:  BMET    Component Value Date/Time   NA 141 08/19/2023 1443   K 4.0 08/19/2023 1443   CL 98 08/19/2023 1443   CO2 23 08/19/2023 1443   GLUCOSE 96 08/19/2023 1443   GLUCOSE 135 (H) 09/06/2021 1035   BUN 13 08/19/2023 1443   CREATININE 0.81 08/19/2023 1443   CALCIUM 9.4 08/19/2023 1443   GFRNONAA >60 09/06/2021 1035   GFRAA 96 05/29/2020 1417   Lab Results  Component Value Date   HGBA1C 6.4 (H) 08/19/2023   HGBA1C 5.9 08/15/2013   Lab Results  Component Value Date   INSULIN 15.1 08/19/2023   INSULIN 21.9 09/08/2017   Lab Results  Component Value Date   TSH 1.430 08/19/2023  CBC    Component Value Date/Time   WBC 6.5 08/19/2023 1443   WBC 11.8 (H) 09/06/2021 1035   RBC 5.17 08/19/2023 1443   RBC 5.94 (H) 09/06/2021 1035   HGB 15.1 08/19/2023 1443   HCT 46.1 08/19/2023 1443   PLT 239 08/19/2023 1443   MCV 89 08/19/2023 1443   MCH 29.2 08/19/2023 1443   MCH 28.5 09/06/2021 1035   MCHC 32.8 08/19/2023 1443   MCHC 33.9 09/06/2021 1035   RDW 13.1 08/19/2023 1443   Iron Studies No results found for: "IRON", "TIBC",  "FERRITIN", "IRONPCTSAT" Lipid Panel     Component Value Date/Time   CHOL 141 11/03/2023 0810   CHOL 182 08/19/2023 1443   TRIG 260.0 (H) 11/03/2023 0810   HDL 39.00 (L) 11/03/2023 0810   HDL 47 08/19/2023 1443   CHOLHDL 4 11/03/2023 0810   VLDL 52.0 (H) 11/03/2023 0810   LDLCALC 50 11/03/2023 0810   LDLCALC 89 08/19/2023 1443   LDLDIRECT 136.0 11/16/2016 0902   Hepatic Function Panel     Component Value Date/Time   PROT 7.5 08/19/2023 1443   ALBUMIN 4.6 08/19/2023 1443   AST 19 11/03/2023 0810   ALT 40 11/03/2023 0810   ALKPHOS 61 08/19/2023 1443   BILITOT 0.6 08/19/2023 1443   BILIDIR 0.0 02/26/2012 1353      Component Value Date/Time   TSH 1.430 08/19/2023 1443   Nutritional Lab Results  Component Value Date   VD25OH 61.5 08/19/2023   VD25OH 49.8 08/13/2022   VD25OH 55.45 09/15/2021     Assessment and Plan    Hypertension Hypertension is elevated at 153/72 mmHg, despite being controlled last month. The patient is on diltiazem, hydrochlorothiazide, losartan, and metoprolol. Stress and dietary sodium intake may be contributing factors. Discussed the importance of stress management and dietary sodium reduction to control blood pressure. Explained that uncontrolled hypertension increases the risk of cardiovascular events such as stroke and heart attack. - Refill hydrochlorothiazide and losartan - Continue current antihypertensive regimen - Encourage stress management and dietary sodium reduction  Obesity The patient has gained 4 pounds in the last month, likely due to fluid retention and dietary factors. He is following his eating plan about 50% of the time and exercising on a treadmill twice a week. Discussed the importance of consistent adherence to the eating plan and regular physical activity to manage weight. Explained that even modest weight loss can improve blood pressure and reduce the risk of cardiovascular disease. - Encourage adherence to eating plan -  Increase physical activity - Monitor weight and dietary intake  Emotional Eating The patient reports emotional eating behaviors, particularly during stressful periods. He is on bupropion SR 200 mg BID. Discussed the role of stress management techniques in controlling emotional eating and maintaining mental health. - Refill bupropion SR - Continue current dose of bupropion - Encourage stress management techniques  Prediabetes Prediabetes is managed with metformin and lifestyle modifications including diet and exercise. The patient is working on improving adherence to his eating plan and increasing physical activity. Discussed the benefits of maintaining a healthy diet and regular exercise to prevent progression to diabetes. Explained that consistent management can reduce the risk of developing type 2 diabetes by up to 58%. - Refill metformin - Continue diet and exercise regimen - Encourage adherence to eating plan and regular physical activity  Vitamin D Deficiency Vitamin D deficiency is managed with a prescription of 50,000 IU per week. The patient reports running out of his vitamin D  supplement. Discussed the importance of maintaining adequate vitamin D levels for bone health and overall well-being. - Refill vitamin D prescription  General Health Maintenance The patient is making efforts to manage his health through diet, exercise, and medication adherence. Discussed the benefits of preparing healthy meals and managing stress during the holiday season. - Encourage preparation of healthy meals - Encourage stress management during holidays  Follow-up - Ensure follow-up appointments are scheduled.          He was informed of the importance of frequent follow up visits to maximize his success with intensive lifestyle modifications for his multiple health conditions.    Quillian Quince, MD

## 2023-12-08 ENCOUNTER — Other Ambulatory Visit (INDEPENDENT_AMBULATORY_CARE_PROVIDER_SITE_OTHER): Payer: Self-pay | Admitting: Family Medicine

## 2023-12-08 DIAGNOSIS — R7303 Prediabetes: Secondary | ICD-10-CM

## 2023-12-10 ENCOUNTER — Other Ambulatory Visit: Payer: Self-pay | Admitting: Cardiology

## 2023-12-13 NOTE — Telephone Encounter (Signed)
 Bryan [name], are you able to help with this? Pt is asking for a receipt from his 08/11/23 ov.

## 2023-12-15 ENCOUNTER — Encounter (INDEPENDENT_AMBULATORY_CARE_PROVIDER_SITE_OTHER): Payer: Self-pay | Admitting: Family Medicine

## 2023-12-15 ENCOUNTER — Other Ambulatory Visit (INDEPENDENT_AMBULATORY_CARE_PROVIDER_SITE_OTHER): Payer: Self-pay | Admitting: Family Medicine

## 2023-12-15 ENCOUNTER — Ambulatory Visit (INDEPENDENT_AMBULATORY_CARE_PROVIDER_SITE_OTHER): Payer: 59 | Admitting: Family Medicine

## 2023-12-15 DIAGNOSIS — R7303 Prediabetes: Secondary | ICD-10-CM | POA: Diagnosis not present

## 2023-12-15 DIAGNOSIS — F5089 Other specified eating disorder: Secondary | ICD-10-CM

## 2023-12-15 DIAGNOSIS — I10 Essential (primary) hypertension: Secondary | ICD-10-CM

## 2023-12-15 DIAGNOSIS — E559 Vitamin D deficiency, unspecified: Secondary | ICD-10-CM

## 2023-12-15 DIAGNOSIS — Z6841 Body Mass Index (BMI) 40.0 and over, adult: Secondary | ICD-10-CM

## 2023-12-15 DIAGNOSIS — F3289 Other specified depressive episodes: Secondary | ICD-10-CM

## 2023-12-15 DIAGNOSIS — E669 Obesity, unspecified: Secondary | ICD-10-CM

## 2023-12-15 MED ORDER — VITAMIN D (ERGOCALCIFEROL) 1.25 MG (50000 UNIT) PO CAPS: 50000.0000 [IU] | ORAL_CAPSULE | ORAL | 0 refills | Status: DC

## 2023-12-15 MED ORDER — HYDROCHLOROTHIAZIDE 25 MG PO TABS: 25.0000 mg | ORAL_TABLET | Freq: Every day | ORAL | 0 refills | Status: DC

## 2023-12-15 MED ORDER — METFORMIN HCL 500 MG PO TABS: ORAL_TABLET | ORAL | 0 refills | Status: DC

## 2023-12-15 MED ORDER — BUPROPION HCL ER (SR) 200 MG PO TB12: ORAL_TABLET | ORAL | 0 refills | Status: DC

## 2023-12-15 MED ORDER — LOSARTAN POTASSIUM 100 MG PO TABS: 100.0000 mg | ORAL_TABLET | Freq: Every day | ORAL | 0 refills | Status: DC

## 2023-12-15 NOTE — Progress Notes (Signed)
 [name], D.O.  ABFM, ABOM Specializing in Clinical Bariatric Medicine  Office located at: [address] [place], [place]  [address]   Assessment and Plan:   FOR THE DISEASE OF OBESITY: Beginning BMI 46.78/Date 09/08/2017 BMI 40.0-44.9, adult (HCC)-current 44.79 Assessment & Plan: Since last office visit on 11/17/23 with Dr. Verdon patient's muscle mass has increased by 4.8lb. Fat mass has decreased by 2.8lb. Total body water has decreased by 3.4 lb.  Counseling done on how various foods will affect these numbers and how to maximize success  Total lbs lost to date: 5 lbs Total weight loss percentage to date: -1.53%   Recommended Dietary Goals Bryan [name] is currently in the action stage of change. As such, his goal is to continue weight management plan.  He has agreed to: continue current plan    Behavioral Intervention We discussed the following today: increasing lean protein intake to established goals, decreasing simple carbohydrates , increasing vegetables, increasing lower glycemic fruits, increasing fiber rich foods, increasing water intake , keeping healthy foods at home, decreasing eating out or consumption of processed foods, and making healthy choices when eating convenient foods, work on managing stress, creating time for self-care and relaxation, planning for success, and continue to work on maintaining a reduced calorie state, getting the recommended amount of protein, incorporating whole foods, making healthy choices, staying well hydrated and practicing mindfulness when eating.  Additional resources provided today: None  Evidence-based interventions for health behavior change were utilized today including the discussion of self monitoring techniques, problem-solving barriers and SMART goal setting techniques.   Regarding patient's less desirable eating habits and patterns, we employed the technique of small changes.   Pt will specifically work on: Focus on  eating lean proteins, fruits, and vegetables and keep self inventory of goals for 2025 for wellbeing for next visit.    Recommended Physical Activity Goals Bryan [name] has been advised to work up to 150 minutes of moderate intensity aerobic activity a week and strengthening exercises 2-3 times per week for cardiovascular health, weight loss maintenance and preservation of muscle mass.   He has agreed to :  Increase physical activity in their day and reduce sedentary time (increase NEAT)., Start strengthening exercises with a goal of 2-3 sessions a week , and Increase the intensity, frequency or duration of strengthening exercises    Pharmacotherapy We discussed various medication options to help Armour with his weight loss efforts and we both agreed to : continue with nutritional and behavioral strategies and continue current anti-obesity medication regimen   FOR ASSOCIATED CONDITIONS ADDRESSED TODAY:  Vitamin D  deficiency Assessment & Plan: Lab Results  Component Value Date   VD25OH 61.5 08/19/2023   VD25OH 49.8 08/13/2022   VD25OH 55.45 09/15/2021   Last vitamin D  was at goal. Pt compliant with ERGO 50K units once weekly (Sundays). Tolerating well with no SE. No concerns in this regard today. Continue with current supplementation plan.   Orders: - Refill ERGO today, no dose changes.    Pre-diabetes Assessment & Plan: Lab Results  Component Value Date   HGBA1C 6.4 (H) 08/19/2023   HGBA1C 5.9 (H) 08/13/2022   HGBA1C 5.8 09/15/2021   INSULIN  15.1 08/19/2023   INSULIN  9.7 08/13/2022   INSULIN  22.6 02/10/2021   Last A1C was above goal at 6.4 as of 08/19/23. Treating prediabetes with Metformin  500 mg TID. Tolerating well, no SE reported. Pt has been working on tax adviser proteins, fruits, and vegetables. He has been exercising more  consistently since the holidays. Reports some off-plan eating over the holidays.   Advised pt to focus on eating fish, poultry, veggies, fruits,  and whole foods -> follow his meal plan. Avoid fried and processed foods. I encouraged pt to start weight training to his exercise regimen as able given his arm injuries. Educated pt of the estimation that the force of excess weight by 3-5 lbs can be exerted through the knees, making it feel 3-6x times that amount. Continue with current medication regimen.   Orders: - Refill Metformin  today with no dose changes.    Essential hypertension Assessment & Plan: BP Readings from Last 3 Encounters:  12/15/23 134/78  11/17/23 (!) 153/72  11/01/23 122/76   BP is at goal today, improved from prior visit. Treating with Losartan  100 mg once daily, HCTZ 25 mg once daily and Toprol -XL 25 mg once daily. Tolerating all antihypertensives well with no reported side effects. Has been working on drinking more water recently.   I recommend he follow his meal plan and eat heart healthy foods. Avoid high sodium foods, increase exercise, and increase daily water intake to at least half his body weight in ounces of water. Continue with current antihypertensive treatment as advised by his PCP/cardiologist.   Orders: - Refill HCTZ today, no dose changes.  - Refill Losartan , no dose changes   Emotional Eating Behavior Assessment & Plan: Moods are stable. He is on Wellbutrin  SR 200 mg BID. Tolerating well with no side effects. Pt has struggled with finding motivation lately. Pt total weight lost is 5 lbs since starting the program, today being his 94th visit.   Plan to start journaling as a self-inventory of his goals for wellbeing in 2025. I recommend he self-reflect on where he is in his progress with weight loss, what he would like to do moving forward, and how he can get ready for the change he would like to see. Advised pt to avoid weighing himself at home.   Orders: - Refill Wellbutrin , no dose changes.    Follow up:   Return in about 4 weeks (around 01/12/2024). He was informed of the importance of  frequent follow up visits to maximize his success with intensive lifestyle modifications for his multiple health conditions.  Subjective:   Chief complaint: Obesity Bryan [name] is here to discuss his progress with his obesity treatment plan. He is on the Category 3 Plan and states he is following his eating plan approximately 60% of the time. He states he is walking 30 minutes 3 days per week.  Interval History:  Bryan [name] is here for a follow up office visit. Since last OV, he is up 3 lbs. Working on eating more lean proteins, vegetables, and fruits. Some off-plan eating over the holidays.   Barriers identified: having difficulty focusing on healthy eating.   Pharmacotherapy for weight loss: He is currently taking Metformin  (off label use for incretin effect and / or insulin  resistance and / or diabetes prevention) with adequate clinical response  and without side effects. and Bupropion  (single agent, off label use) with adequate clinical response  and without side effects..   Review of Systems:  Pertinent positives were addressed with patient today.  Reviewed by clinician on day of visit: allergies, medications, problem list, medical history, surgical history, family history, social history, and previous encounter notes.  Weight Summary and Biometrics   Weight Lost Since Last Visit: 0lb  Weight Gained Since Last Visit: 3lb   Vitals Temp: 98.3 F (36.8  C) BP: 134/78 Pulse Rate: 73 SpO2: 96 %   Anthropometric Measurements Height: 5' 11 (1.803 m) Weight: (!) 321 lb (145.6 kg) BMI (Calculated): 44.79 Weight at Last Visit: 318lb Weight Lost Since Last Visit: 0lb Weight Gained Since Last Visit: 3lb Starting Weight: 326lb Total Weight Loss (lbs): 5 lb (2.268 kg)   Body Composition  Body Fat %: 40.4 % Fat Mass (lbs): 129.6 lbs Muscle Mass (lbs): 182 lbs Total Body Water (lbs): 137 lbs Visceral Fat Rating : 28   Other Clinical Data Fasting: no Labs: no Today's  Visit #: 94 Starting Date: 09/08/17    Objective:   PHYSICAL EXAM: Blood pressure 134/78, pulse 73, temperature 98.3 F (36.8 C), height 5' 11 (1.803 m), weight (!) 321 lb (145.6 kg), SpO2 96%. Body mass index is 44.77 kg/m.  General: he is overweight, cooperative and in no acute distress. PSYCH: Has normal mood, affect and thought process.   HEENT: EOMI, sclerae are anicteric. Lungs: Normal breathing effort, no conversational dyspnea. Extremities: Moves * 4 Neurologic: A and O * 3, good insight  DIAGNOSTIC DATA REVIEWED: BMET    Component Value Date/Time   NA 141 08/19/2023 1443   K 4.0 08/19/2023 1443   CL 98 08/19/2023 1443   CO2 23 08/19/2023 1443   GLUCOSE 96 08/19/2023 1443   GLUCOSE 135 (H) 09/06/2021 1035   BUN 13 08/19/2023 1443   CREATININE 0.81 08/19/2023 1443   CALCIUM  9.4 08/19/2023 1443   GFRNONAA >60 09/06/2021 1035   GFRAA 96 05/29/2020 1417   Lab Results  Component Value Date   HGBA1C 6.4 (H) 08/19/2023   HGBA1C 5.9 08/15/2013   Lab Results  Component Value Date   INSULIN  15.1 08/19/2023   INSULIN  21.9 09/08/2017   Lab Results  Component Value Date   TSH 1.430 08/19/2023   CBC    Component Value Date/Time   WBC 6.5 08/19/2023 1443   WBC 11.8 (H) 09/06/2021 1035   RBC 5.17 08/19/2023 1443   RBC 5.94 (H) 09/06/2021 1035   HGB 15.1 08/19/2023 1443   HCT 46.1 08/19/2023 1443   PLT 239 08/19/2023 1443   MCV 89 08/19/2023 1443   MCH 29.2 08/19/2023 1443   MCH 28.5 09/06/2021 1035   MCHC 32.8 08/19/2023 1443   MCHC 33.9 09/06/2021 1035   RDW 13.1 08/19/2023 1443   Iron Studies No results found for: IRON, TIBC, FERRITIN, IRONPCTSAT Lipid Panel     Component Value Date/Time   CHOL 141 11/03/2023 0810   CHOL 182 08/19/2023 1443   TRIG 260.0 (H) 11/03/2023 0810   HDL 39.00 (L) 11/03/2023 0810   HDL 47 08/19/2023 1443   CHOLHDL 4 11/03/2023 0810   VLDL 52.0 (H) 11/03/2023 0810   LDLCALC 50 11/03/2023 0810   LDLCALC 89  08/19/2023 1443   LDLDIRECT 136.0 11/16/2016 0902   Hepatic Function Panel     Component Value Date/Time   PROT 7.5 08/19/2023 1443   ALBUMIN 4.6 08/19/2023 1443   AST 19 11/03/2023 0810   ALT 40 11/03/2023 0810   ALKPHOS 61 08/19/2023 1443   BILITOT 0.6 08/19/2023 1443   BILIDIR 0.0 02/26/2012 1353      Component Value Date/Time   TSH 1.430 08/19/2023 1443   Nutritional Lab Results  Component Value Date   VD25OH 61.5 08/19/2023   VD25OH 49.8 08/13/2022   VD25OH 55.45 09/15/2021    Attestations:   I, Vernell Forest, acting as a stage manager for [employer], DO., have compiled all  relevant documentation for today's office visit on behalf of Barnie [name], DO, while in the presence of [employer], DO.  Reviewed by clinician on day of visit: allergies, medications, problem list, medical history, surgical history, family history, social history, and previous encounter notes pertinent to patient's obesity diagnosis.  I have reviewed the above documentation for accuracy and completeness, and I agree with the above. Barnie JINNY [name], D.O.  The 21st Century Cures Act was signed into law in 2016 which includes the topic of electronic health records.  This provides immediate access to information in MyChart.  This includes consultation notes, operative notes, office notes, lab results and pathology reports.  If you have any questions about what you read please let us  know at your next visit so we can discuss your concerns and take corrective action if need be.  We are right here with you.

## 2024-01-10 ENCOUNTER — Other Ambulatory Visit (INDEPENDENT_AMBULATORY_CARE_PROVIDER_SITE_OTHER): Payer: Self-pay | Admitting: Family Medicine

## 2024-01-10 DIAGNOSIS — R7303 Prediabetes: Secondary | ICD-10-CM

## 2024-01-12 ENCOUNTER — Ambulatory Visit (INDEPENDENT_AMBULATORY_CARE_PROVIDER_SITE_OTHER): Payer: BC Managed Care – PPO | Admitting: Family Medicine

## 2024-01-13 ENCOUNTER — Other Ambulatory Visit (INDEPENDENT_AMBULATORY_CARE_PROVIDER_SITE_OTHER): Payer: Self-pay | Admitting: Family Medicine

## 2024-01-13 ENCOUNTER — Ambulatory Visit (INDEPENDENT_AMBULATORY_CARE_PROVIDER_SITE_OTHER): Payer: 59 | Admitting: Family Medicine

## 2024-01-13 ENCOUNTER — Encounter (INDEPENDENT_AMBULATORY_CARE_PROVIDER_SITE_OTHER): Payer: Self-pay | Admitting: Family Medicine

## 2024-01-13 VITALS — BP 138/88 | HR 81 | Temp 98.3°F | Ht 71.0 in | Wt 321.0 lb

## 2024-01-13 DIAGNOSIS — I4892 Unspecified atrial flutter: Secondary | ICD-10-CM | POA: Diagnosis not present

## 2024-01-13 DIAGNOSIS — Z6841 Body Mass Index (BMI) 40.0 and over, adult: Secondary | ICD-10-CM

## 2024-01-13 DIAGNOSIS — E559 Vitamin D deficiency, unspecified: Secondary | ICD-10-CM

## 2024-01-13 DIAGNOSIS — E669 Obesity, unspecified: Secondary | ICD-10-CM

## 2024-01-13 DIAGNOSIS — I10 Essential (primary) hypertension: Secondary | ICD-10-CM

## 2024-01-13 DIAGNOSIS — R7303 Prediabetes: Secondary | ICD-10-CM

## 2024-01-13 DIAGNOSIS — F5089 Other specified eating disorder: Secondary | ICD-10-CM

## 2024-01-13 DIAGNOSIS — F3289 Other specified depressive episodes: Secondary | ICD-10-CM

## 2024-01-13 MED ORDER — VITAMIN D (ERGOCALCIFEROL) 1.25 MG (50000 UNIT) PO CAPS: 50000.0000 [IU] | ORAL_CAPSULE | ORAL | 0 refills | Status: DC

## 2024-01-13 MED ORDER — METFORMIN HCL 500 MG PO TABS: ORAL_TABLET | ORAL | 0 refills | Status: DC

## 2024-01-13 MED ORDER — BUPROPION HCL ER (SR) 200 MG PO TB12: ORAL_TABLET | ORAL | 0 refills | Status: DC

## 2024-01-13 MED ORDER — LOSARTAN POTASSIUM 100 MG PO TABS: 100.0000 mg | ORAL_TABLET | Freq: Every day | ORAL | 0 refills | Status: DC

## 2024-01-13 MED ORDER — HYDROCHLOROTHIAZIDE 25 MG PO TABS: 25.0000 mg | ORAL_TABLET | Freq: Every day | ORAL | 0 refills | Status: DC

## 2024-01-13 NOTE — Progress Notes (Signed)
 .smr  Office: [phone]  /  Fax: [phone]  WEIGHT SUMMARY AND BIOMETRICS  Anthropometric Measurements Height: 5' 11 (1.803 m) Weight: (!) 321 lb (145.6 kg) BMI (Calculated): 44.79 Weight at Last Visit: 321 lb Weight Lost Since Last Visit: 0 Weight Gained Since Last Visit: 0 Starting Weight: 326 lb Total Weight Loss (lbs): 5 lb (2.268 kg)   Body Composition  Body Fat %: 41.4 % Fat Mass (lbs): 133.2 lbs Muscle Mass (lbs): 179.4 lbs Total Body Water (lbs): 139.6 lbs Visceral Fat Rating : 29   Other Clinical Data Fasting: No Labs: No Today's Visit #: 95 Starting Date: 09/08/17    Chief Complaint: OBESITY    History of Present Illness   Bryan [name] is a 63 year old male with obesity, vitamin D  deficiency, prediabetes, and hypertension who presents for management of these conditions and emotional eating behaviors.  He is managing obesity and emotional eating behaviors. He has maintained his weight over the past month and adheres to his category 3 eating plan about 50% of the time. He engages in walking for 45 minutes three times a week and is attempting to incorporate weight training. He experiences difficulty with protein intake, particularly with chicken, but tolerates salmon well. He has increased his bupropion  to 200 mg twice a day, which he feels has helped with cravings and emotional eating.  His blood pressure was elevated today at 153/82 and remained elevated at 165/80 on repeat measurement. He is currently taking losartan  100 mg daily, metoprolol  25 mg at bedtime, and hydrochlorothiazide  25 mg daily for hypertension. He notes that his blood pressure readings are consistently high when taken in a specific room, and he questions the accuracy of the machine used there.  He is on metformin  for prediabetes, which has worsened since last fall. His last A1c was 6.4, indicating a close approach to diabetes. He tolerates metformin  well without side effects and  requests a refill.  He is on prescription vitamin D  for his deficiency, and his last level was at goal. He takes it weekly and requests a refill.  He is on diltiazem  for atrial fibrillation but admits to not taking it regularly.  He lives with his family, and his daughter is a engineer, production, which presents a challenge in avoiding sweets. He has two grown children living in [place] and a daughter who is planning to study architecture abroad.  No side effects from metformin , such as nausea or diarrhea. No issues with sleep when taking metformin  at night.          PHYSICAL EXAM:  Blood pressure 138/88, pulse 81, temperature 98.3 F (36.8 C), height 5' 11 (1.803 m), weight (!) 321 lb (145.6 kg), SpO2 97%. Body mass index is 44.77 kg/m.  DIAGNOSTIC DATA REVIEWED:  BMET    Component Value Date/Time   NA 141 08/19/2023 1443   K 4.0 08/19/2023 1443   CL 98 08/19/2023 1443   CO2 23 08/19/2023 1443   GLUCOSE 96 08/19/2023 1443   GLUCOSE 135 (H) 09/06/2021 1035   BUN 13 08/19/2023 1443   CREATININE 0.81 08/19/2023 1443   CALCIUM  9.4 08/19/2023 1443   GFRNONAA >60 09/06/2021 1035   GFRAA 96 05/29/2020 1417   Lab Results  Component Value Date   HGBA1C 6.4 (H) 08/19/2023   HGBA1C 5.9 08/15/2013   Lab Results  Component Value Date   INSULIN  15.1 08/19/2023   INSULIN  21.9 09/08/2017   Lab Results  Component Value Date   TSH 1.430  08/19/2023   CBC    Component Value Date/Time   WBC 6.5 08/19/2023 1443   WBC 11.8 (H) 09/06/2021 1035   RBC 5.17 08/19/2023 1443   RBC 5.94 (H) 09/06/2021 1035   HGB 15.1 08/19/2023 1443   HCT 46.1 08/19/2023 1443   PLT 239 08/19/2023 1443   MCV 89 08/19/2023 1443   MCH 29.2 08/19/2023 1443   MCH 28.5 09/06/2021 1035   MCHC 32.8 08/19/2023 1443   MCHC 33.9 09/06/2021 1035   RDW 13.1 08/19/2023 1443   Iron Studies No results found for: IRON, TIBC, FERRITIN, IRONPCTSAT Lipid Panel     Component Value Date/Time   CHOL 141 11/03/2023  0810   CHOL 182 08/19/2023 1443   TRIG 260.0 (H) 11/03/2023 0810   HDL 39.00 (L) 11/03/2023 0810   HDL 47 08/19/2023 1443   CHOLHDL 4 11/03/2023 0810   VLDL 52.0 (H) 11/03/2023 0810   LDLCALC 50 11/03/2023 0810   LDLCALC 89 08/19/2023 1443   LDLDIRECT 136.0 11/16/2016 0902   Hepatic Function Panel     Component Value Date/Time   PROT 7.5 08/19/2023 1443   ALBUMIN 4.6 08/19/2023 1443   AST 19 11/03/2023 0810   ALT 40 11/03/2023 0810   ALKPHOS 61 08/19/2023 1443   BILITOT 0.6 08/19/2023 1443   BILIDIR 0.0 02/26/2012 1353      Component Value Date/Time   TSH 1.430 08/19/2023 1443   Nutritional Lab Results  Component Value Date   VD25OH 61.5 08/19/2023   VD25OH 49.8 08/13/2022   VD25OH 55.45 09/15/2021     Assessment and Plan    Hypertension Blood pressure readings today were elevated at 153/82 and 165/80. A manual reading at the end of the visit was 138/88, which is improved. He is currently on losartan  100 mg daily, metoprolol  25 mg at bedtime, and hydrochlorothiazide  25 mg daily. He is not taking diltiazem  regularly. Discussed the importance of consistent blood pressure control to prevent end organ damage. - Continue current antihypertensive medications - Recheck blood pressure at next visit  Atrial Flutter He is on diltiazem  but not taking it regularly. He is also on metoprolol  25 mg at bedtime. Discussed the importance of regular use of diltiazem  to manage atrial fibrillation effectively. - Encourage weight loss to reduce risk of exacerbation -Continue to work on tight BP control  Obesity He has maintained his weight over the last month but has experienced a slight decrease in muscle mass and an increase in water retention. He is following his category 3 eating plan about 50% of the time and is walking for 45 minutes three times per week. He is also incorporating weight training into his exercise routine. He is working on increasing protein intake, which he finds  challenging, especially with chicken. He tolerates salmon well and is trying to incorporate more protein during lunch. - Continue current diet and exercise regimen - Increase protein intake to support muscle mass - Incorporate more strengthening activities  Prediabetes He is on metformin  for prediabetes, which has worsened since last fall. His last A1c was 6.4% in September. Discussed the importance of maintaining diet and exercise to prevent progression to type 2 diabetes. - Refill metformin  - Plan for fasting labs at next visit  Emotional Eating Increasing bupropion  to 200 mg SR twice a day has helped reduce cravings. He has been on this dose for about a month. - Continue bupropion  200 mg SR twice a day  Vitamin D  Deficiency He is on prescription vitamin D ,  and his last vitamin D  level was at goal. He requests a refill. - Refill prescription vitamin D   Follow-up - Schedule follow-up appointment in April 2025.        He was informed of the importance of frequent follow up visits to maximize his success with intensive lifestyle modifications for his multiple health conditions.    Louann Penton, MD

## 2024-01-27 ENCOUNTER — Telehealth: Payer: Self-pay

## 2024-01-27 NOTE — Telephone Encounter (Signed)
Received through [employer] that Saxenda PA has been resolved.

## 2024-02-06 ENCOUNTER — Other Ambulatory Visit (INDEPENDENT_AMBULATORY_CARE_PROVIDER_SITE_OTHER): Payer: Self-pay | Admitting: Family Medicine

## 2024-02-06 DIAGNOSIS — I10 Essential (primary) hypertension: Secondary | ICD-10-CM

## 2024-02-06 DIAGNOSIS — F3289 Other specified depressive episodes: Secondary | ICD-10-CM

## 2024-02-06 DIAGNOSIS — R7303 Prediabetes: Secondary | ICD-10-CM

## 2024-02-18 ENCOUNTER — Other Ambulatory Visit (INDEPENDENT_AMBULATORY_CARE_PROVIDER_SITE_OTHER): Payer: Self-pay | Admitting: Family Medicine

## 2024-02-18 DIAGNOSIS — E559 Vitamin D deficiency, unspecified: Secondary | ICD-10-CM

## 2024-02-23 ENCOUNTER — Ambulatory Visit (INDEPENDENT_AMBULATORY_CARE_PROVIDER_SITE_OTHER): Payer: 59 | Admitting: Family Medicine

## 2024-02-23 ENCOUNTER — Encounter (INDEPENDENT_AMBULATORY_CARE_PROVIDER_SITE_OTHER): Payer: Self-pay | Admitting: Family Medicine

## 2024-02-23 ENCOUNTER — Other Ambulatory Visit (INDEPENDENT_AMBULATORY_CARE_PROVIDER_SITE_OTHER): Payer: Self-pay | Admitting: Family Medicine

## 2024-02-23 VITALS — BP 133/78 | HR 69 | Temp 98.2°F | Ht 71.0 in | Wt 317.0 lb

## 2024-02-23 DIAGNOSIS — E559 Vitamin D deficiency, unspecified: Secondary | ICD-10-CM

## 2024-02-23 DIAGNOSIS — Z6841 Body Mass Index (BMI) 40.0 and over, adult: Secondary | ICD-10-CM

## 2024-02-23 DIAGNOSIS — I10 Essential (primary) hypertension: Secondary | ICD-10-CM

## 2024-02-23 DIAGNOSIS — F5089 Other specified eating disorder: Secondary | ICD-10-CM

## 2024-02-23 DIAGNOSIS — R7303 Prediabetes: Secondary | ICD-10-CM | POA: Diagnosis not present

## 2024-02-23 DIAGNOSIS — E782 Mixed hyperlipidemia: Secondary | ICD-10-CM | POA: Diagnosis not present

## 2024-02-23 DIAGNOSIS — E669 Obesity, unspecified: Secondary | ICD-10-CM

## 2024-02-23 DIAGNOSIS — F3289 Other specified depressive episodes: Secondary | ICD-10-CM

## 2024-02-23 MED ORDER — VITAMIN D (ERGOCALCIFEROL) 1.25 MG (50000 UNIT) PO CAPS: 50000.0000 [IU] | ORAL_CAPSULE | ORAL | 0 refills | Status: DC

## 2024-02-23 MED ORDER — LOSARTAN POTASSIUM 100 MG PO TABS: 100.0000 mg | ORAL_TABLET | Freq: Every day | ORAL | 0 refills | Status: DC

## 2024-02-23 MED ORDER — METFORMIN HCL 500 MG PO TABS: ORAL_TABLET | ORAL | 0 refills | Status: DC

## 2024-02-23 MED ORDER — HYDROCHLOROTHIAZIDE 25 MG PO TABS: 25.0000 mg | ORAL_TABLET | Freq: Every day | ORAL | 0 refills | Status: DC

## 2024-02-23 NOTE — Progress Notes (Signed)
 Bryan [name], D.O.  ABFM, ABOM Specializing in Clinical Bariatric Medicine  Office located at: [address] [place], [place]  [address]   Assessment and Plan:  Labs were ordered today (***), pt understands that labs will be obtained before next OV and reviewed with Dr. Dalbert Garnet.  FOR THE DISEASE OF OBESITY: BMI 40.0-44.9, adult (HCC)-current 44.79 Obesity, Beginning BMI 46.78 Assessment & Plan: Since last office visit on 01/13/2024, patient's muscle mass has increased by 2.4 lbs. Fat mass has decreased by 7.4 lbs. Total body water has decreased by 6.6 lbs.  Counseling done on how various foods will affect these numbers and how to maximize success  Total lbs lost to date: 9 lbs Total weight loss percentage to date: 2.76%    Recommended Dietary Goals Jedi is currently in the action stage of change. As such, his goal is to continue weight management plan.  He has agreed to: continue current plan   Behavioral Intervention We discussed the following today: continue to work on maintaining a reduced calorie state, getting the recommended amount of protein, incorporating whole foods, making healthy choices, staying well hydrated and practicing mindfulness when eating.  Additional resources provided today:  Mindful eating handout  Evidence-based interventions for health behavior change were utilized today including the discussion of self monitoring techniques, problem-solving barriers and SMART goal setting techniques.   Regarding patient's less desirable eating habits and patterns, we employed the technique of small changes.   Pt will specifically work on: n/a   Recommended Physical Activity Goals Jerren has been advised to work up to 150 minutes of moderate intensity aerobic activity a week and strengthening exercises 2-3 times per week for cardiovascular health, weight loss maintenance and preservation of muscle mass.   He has agreed to: Go to the gym at least 3-4  days a week   Pharmacotherapy We both agreed to : continue with nutritional and behavioral strategies and current medication regimen   FOR ASSOCIATED CONDITIONS ADDRESSED TODAY:  Pre-diabetes Assessment & Plan:    Metformin 500 mg three times daily   Essential hypertension Assessment & Plan:    Hctz 25 mg daily Cardizem 30 mg 1-2 tablets as needed Cozaar 100 mg daily Toprol-XL 25 mg daily   Mixed hyperlipidemia Assessment & Plan:   Lipitor 40 mg daily    Vitamin D deficiency Assessment & Plan:    ERGO 50,000 once weekly   Eating Behavior Assessment & Plan:     Noticed decrease in cravings and emotional eating Wellbutrin SR 200 mg 1 po BID   Follow up:   Return in about 5 weeks (around 03/29/2024). He was informed of the importance of frequent follow up visits to maximize his success with intensive lifestyle modifications for his multiple health conditions.  Subjective:   Chief complaint: Obesity [name] is here to discuss his progress with his obesity treatment plan. He is on the the Category 3 Plan and states he is following his eating plan approximately 65% of the time. He states he is walking 30-45 minutes 2-3 days per week.  Interval History:  ERSKINE STEINFELDT is here for a follow up office visit. Since last OV on ,  ***     Been making conscious effort to eat more natural foods and eating slower Been cooking at home more Not eating in bed at home, making an effort to eat at the table Exercise been minimal d/t busy schedule    Pharmacotherapy for weight loss: He is currently  taking Wellbutrin SR 200 mg 1 po BID and Metformin 500 mg three times daily.   Review of Systems:  Pertinent positives were addressed with patient today.  Reviewed by clinician on day of visit: allergies, medications, problem list, medical history, surgical history, family history, social history, and previous encounter notes.  Weight Summary and Biometrics    Weight Lost Since Last Visit: 4 lb  Weight Gained Since Last Visit: 0   Vitals Temp: 98.2 F (36.8 C) BP: 133/78 Pulse Rate: 69 SpO2: 99 %   Anthropometric Measurements Height: 5\' 11"  (1.803 m) Weight: (!) 317 lb (143.8 kg) BMI (Calculated): 44.23 Weight at Last Visit: 321 lb Weight Lost Since Last Visit: 4 lb Weight Gained Since Last Visit: 0 Starting Weight: 326 lb Total Weight Loss (lbs): 9 lb (4.082 kg)   Body Composition  Body Fat %: 39.7 % Fat Mass (lbs): 125.8 lbs Muscle Mass (lbs): 181.8 lbs Total Body Water (lbs): 133 lbs Visceral Fat Rating : 27   Other Clinical Data Fasting: yes Labs: yes Today's Visit #: 95 Starting Date: 09/08/17    Objective:   PHYSICAL EXAM: Blood pressure 133/78, pulse 69, temperature 98.2 F (36.8 C), height 5\' 11"  (1.803 m), weight (!) 317 lb (143.8 kg), SpO2 99%. Body mass index is 44.21 kg/m.  General: he is overweight, cooperative and in no acute distress. PSYCH: Has normal mood, affect and thought process.   HEENT: EOMI, sclerae are anicteric. Lungs: Normal breathing effort, no conversational dyspnea. Extremities: Moves * 4 Neurologic: A and O * 3, good insight  DIAGNOSTIC DATA REVIEWED: BMET    Component Value Date/Time   NA 141 08/19/2023 1443   K 4.0 08/19/2023 1443   CL 98 08/19/2023 1443   CO2 23 08/19/2023 1443   GLUCOSE 96 08/19/2023 1443   GLUCOSE 135 (H) 09/06/2021 1035   BUN 13 08/19/2023 1443   CREATININE 0.81 08/19/2023 1443   CALCIUM 9.4 08/19/2023 1443   GFRNONAA >60 09/06/2021 1035   GFRAA 96 05/29/2020 1417   Lab Results  Component Value Date   HGBA1C 6.4 (H) 08/19/2023   HGBA1C 5.9 08/15/2013   Lab Results  Component Value Date   INSULIN 15.1 08/19/2023   INSULIN 21.9 09/08/2017   Lab Results  Component Value Date   TSH 1.430 08/19/2023   CBC    Component Value Date/Time   WBC 6.5 08/19/2023 1443   WBC 11.8 (H) 09/06/2021 1035   RBC 5.17 08/19/2023 1443   RBC 5.94 (H)  09/06/2021 1035   HGB 15.1 08/19/2023 1443   HCT 46.1 08/19/2023 1443   PLT 239 08/19/2023 1443   MCV 89 08/19/2023 1443   MCH 29.2 08/19/2023 1443   MCH 28.5 09/06/2021 1035   MCHC 32.8 08/19/2023 1443   MCHC 33.9 09/06/2021 1035   RDW 13.1 08/19/2023 1443   Iron Studies No results found for: "IRON", "TIBC", "FERRITIN", "IRONPCTSAT" Lipid Panel     Component Value Date/Time   CHOL 141 11/03/2023 0810   CHOL 182 08/19/2023 1443   TRIG 260.0 (H) 11/03/2023 0810   HDL 39.00 (L) 11/03/2023 0810   HDL 47 08/19/2023 1443   CHOLHDL 4 11/03/2023 0810   VLDL 52.0 (H) 11/03/2023 0810   LDLCALC 50 11/03/2023 0810   LDLCALC 89 08/19/2023 1443   LDLDIRECT 136.0 11/16/2016 0902   Hepatic Function Panel     Component Value Date/Time   PROT 7.5 08/19/2023 1443   ALBUMIN 4.6 08/19/2023 1443   AST 19 11/03/2023 0810  ALT 40 11/03/2023 0810   ALKPHOS 61 08/19/2023 1443   BILITOT 0.6 08/19/2023 1443   BILIDIR 0.0 02/26/2012 1353      Component Value Date/Time   TSH 1.430 08/19/2023 1443   Nutritional Lab Results  Component Value Date   VD25OH 61.5 08/19/2023   VD25OH 49.8 08/13/2022   VD25OH 55.45 09/15/2021    Attestations:   I, Camryn Mix, acting as a Stage manager for [employer], DO., have compiled all relevant documentation for today's office visit on behalf of Thomasene Lot, DO, while in the presence of [employer], DO.  I have reviewed the above documentation for accuracy and completeness, and I agree with the above. Bryan [name], D.O.  The 21st Century Cures Act was signed into law in 2016 which includes the topic of electronic health records.  This provides immediate access to information in MyChart.  This includes consultation notes, operative notes, office notes, lab results and pathology reports.  If you have any questions about what you read please let us know at your next visit so we can discuss your concerns and take corrective action if need be.   We are right here with you.

## 2024-03-07 ENCOUNTER — Other Ambulatory Visit (INDEPENDENT_AMBULATORY_CARE_PROVIDER_SITE_OTHER): Payer: Self-pay | Admitting: Family Medicine

## 2024-03-07 DIAGNOSIS — I10 Essential (primary) hypertension: Secondary | ICD-10-CM

## 2024-03-07 DIAGNOSIS — R7303 Prediabetes: Secondary | ICD-10-CM

## 2024-03-29 ENCOUNTER — Encounter (INDEPENDENT_AMBULATORY_CARE_PROVIDER_SITE_OTHER): Payer: Self-pay | Admitting: Family Medicine

## 2024-03-29 ENCOUNTER — Ambulatory Visit (INDEPENDENT_AMBULATORY_CARE_PROVIDER_SITE_OTHER): Payer: 59 | Admitting: Family Medicine

## 2024-03-29 VITALS — BP 142/75 | HR 66 | Temp 98.2°F | Ht 71.0 in | Wt 318.0 lb

## 2024-03-29 DIAGNOSIS — E559 Vitamin D deficiency, unspecified: Secondary | ICD-10-CM

## 2024-03-29 DIAGNOSIS — M25561 Pain in right knee: Secondary | ICD-10-CM | POA: Diagnosis not present

## 2024-03-29 DIAGNOSIS — R7303 Prediabetes: Secondary | ICD-10-CM

## 2024-03-29 DIAGNOSIS — Z6841 Body Mass Index (BMI) 40.0 and over, adult: Secondary | ICD-10-CM

## 2024-03-29 DIAGNOSIS — E669 Obesity, unspecified: Secondary | ICD-10-CM

## 2024-03-29 DIAGNOSIS — I10 Essential (primary) hypertension: Secondary | ICD-10-CM

## 2024-03-29 DIAGNOSIS — F3289 Other specified depressive episodes: Secondary | ICD-10-CM

## 2024-03-29 MED ORDER — LOSARTAN POTASSIUM 100 MG PO TABS: 100.0000 mg | ORAL_TABLET | Freq: Every day | ORAL | 0 refills | Status: DC

## 2024-03-29 MED ORDER — CHLORTHALIDONE 25 MG PO TABS: 25.0000 mg | ORAL_TABLET | Freq: Every day | ORAL | 0 refills | Status: DC

## 2024-03-29 MED ORDER — VITAMIN D (ERGOCALCIFEROL) 1.25 MG (50000 UNIT) PO CAPS: 50000.0000 [IU] | ORAL_CAPSULE | ORAL | 0 refills | Status: DC

## 2024-03-29 MED ORDER — BUPROPION HCL ER (SR) 200 MG PO TB12: ORAL_TABLET | ORAL | 0 refills | Status: DC

## 2024-03-29 NOTE — Progress Notes (Signed)
 Office: [phone]  /  Fax: [phone]  WEIGHT SUMMARY AND BIOMETRICS  Anthropometric Measurements Height: 5\' 11"  (1.803 m) Weight: (!) 318 lb (144.2 kg) BMI (Calculated): 44.37 Weight at Last Visit: 317 lb Weight Lost Since Last Visit: 0 Weight Gained Since Last Visit: 1 lb Starting Weight: 326 lb Total Weight Loss (lbs): 8 lb (3.629 kg)   Body Composition  Body Fat %: 39.7 % Fat Mass (lbs): 126.6 lbs Muscle Mass (lbs): 182.8 lbs Total Body Water (lbs): 134.4 lbs Visceral Fat Rating : 27   Other Clinical Data Fasting: no Labs: no Today's Visit #: 96 Starting Date: 09/08/17    Chief Complaint: OBESITY  History of Present Illness Bryan [name] is a 63 year old male with obesity and hypertension who presents for obesity treatment and blood pressure management.  He is adhering to a category three eating plan 65% of the time and exercises for 30 minutes, four times per week. Despite these efforts, he has gained one pound in the last month. He prefers salty foods and satisfies sweet cravings with dark chocolate. He is improving meal planning and preparation using a shared grocery list app and online grocery orders to avoid impulse purchases.  His blood pressure was elevated today at 162/81, with a repeat measurement showing improvement at 142/75. He is currently taking losartan , hydrochlorothiazide , diltiazem , and metoprolol  for hypertension. He experiences some stress from work and home but reports good sleep with CPAP use. He uses apps to aid sleep if needed. Compression socks have helped reduce fluid retention, resulting in a recent weight loss of about five pounds.  He experiences right knee discomfort, described as weakness rather than pain, particularly after increasing treadmill speed to a slight jog. He usually walks at three miles per hour and occasionally increases to four miles per hour. He incorporates weight training twice a week, focusing on exercises that do not  aggravate shoulder pain. Advil has been effective for knee discomfort, which has improved recently.  He is on Wellbutrin  200 mg twice daily, which he remembers to take during the week but sometimes forgets on weekends. He also takes a multivitamin, fish oil, melatonin, CoQ10, and occasionally B complex vitamins.      PHYSICAL EXAM:  Blood pressure (!) 142/75, pulse 66, temperature 98.2 F (36.8 C), height 5\' 11"  (1.803 m), weight (!) 318 lb (144.2 kg), SpO2 100%. Body mass index is 44.35 kg/m.  DIAGNOSTIC DATA REVIEWED:  BMET    Component Value Date/Time   NA 141 08/19/2023 1443   K 4.0 08/19/2023 1443   CL 98 08/19/2023 1443   CO2 23 08/19/2023 1443   GLUCOSE 96 08/19/2023 1443   GLUCOSE 135 (H) 09/06/2021 1035   BUN 13 08/19/2023 1443   CREATININE 0.81 08/19/2023 1443   CALCIUM  9.4 08/19/2023 1443   GFRNONAA >60 09/06/2021 1035   GFRAA 96 05/29/2020 1417   Lab Results  Component Value Date   HGBA1C 6.4 (H) 08/19/2023   HGBA1C 5.9 08/15/2013   Lab Results  Component Value Date   INSULIN  15.1 08/19/2023   INSULIN  21.9 09/08/2017   Lab Results  Component Value Date   TSH 1.430 08/19/2023   CBC    Component Value Date/Time   WBC 6.5 08/19/2023 1443   WBC 11.8 (H) 09/06/2021 1035   RBC 5.17 08/19/2023 1443   RBC 5.94 (H) 09/06/2021 1035   HGB 15.1 08/19/2023 1443   HCT 46.1 08/19/2023 1443   PLT 239 08/19/2023 1443   MCV 89 08/19/2023 1443  MCH 29.2 08/19/2023 1443   MCH 28.5 09/06/2021 1035   MCHC 32.8 08/19/2023 1443   MCHC 33.9 09/06/2021 1035   RDW 13.1 08/19/2023 1443   Iron Studies No results found for: "IRON", "TIBC", "FERRITIN", "IRONPCTSAT" Lipid Panel     Component Value Date/Time   CHOL 141 11/03/2023 0810   CHOL 182 08/19/2023 1443   TRIG 260.0 (H) 11/03/2023 0810   HDL 39.00 (L) 11/03/2023 0810   HDL 47 08/19/2023 1443   CHOLHDL 4 11/03/2023 0810   VLDL 52.0 (H) 11/03/2023 0810   LDLCALC 50 11/03/2023 0810   LDLCALC 89 08/19/2023  1443   LDLDIRECT 136.0 11/16/2016 0902   Hepatic Function Panel     Component Value Date/Time   PROT 7.5 08/19/2023 1443   ALBUMIN 4.6 08/19/2023 1443   AST 19 11/03/2023 0810   ALT 40 11/03/2023 0810   ALKPHOS 61 08/19/2023 1443   BILITOT 0.6 08/19/2023 1443   BILIDIR 0.0 02/26/2012 1353      Component Value Date/Time   TSH 1.430 08/19/2023 1443   Nutritional Lab Results  Component Value Date   VD25OH 61.5 08/19/2023   VD25OH 49.8 08/13/2022   VD25OH 55.45 09/15/2021     Assessment and Plan Assessment & Plan Hypertension Hypertension remains elevated despite current medication regimen. Blood pressure readings today were 162/81 and 142/75. Current medications include losartan , hydrochlorothiazide , diltiazem , and metoprolol . Consider switching hydrochlorothiazide  to chlorthalidone  for improved blood pressure control. Electrolytes have been stable on hydrochlorothiazide , and no significant changes are anticipated with chlorthalidone . Discussed that chlorthalidone  is similar to hydrochlorothiazide  but may offer better blood pressure control. Potential for electrolyte changes exists, but unlikely given stable history. - Switch hydrochlorothiazide  to chlorthalidone  25 mg daily - Refill losartan  prescription - Instruct him to return for labs before next visit  Right knee pain Right knee pain associated with increased exercise intensity. Pain localized to the knee, described as weakness rather than pain. Occurred after increasing treadmill speed to a jog. Advised to modify exercise routine to reduce impact and focus on incline walking rather than speed to prevent exacerbation. Encouraged to continue weight training with modifications to avoid shoulder pain. - Advise him to modify treadmill routine to focus on incline rather than speed - Encourage continuation of weight training with modifications to avoid shoulder pain  Obesity Obesity management ongoing. Following category three  eating plan 65% of the time and engaging in a variety of exercises 30 minutes four times per week. Gained one pound in the last month. He is working on weight loss and has been advised to focus on meal prepping and planning to improve adherence to dietary goals. Discussed the importance of routine and planning in maintaining dietary adherence. - Encourage continued adherence to exercise regimen - Advise on meal prepping and planning to improve dietary adherence  Vitamin D  deficiency Vitamin D  deficiency management ongoing. No current issues reported with supplementation. - Refill vitamin D  prescription    He was informed of the importance of frequent follow up visits to maximize his success with intensive lifestyle modifications for his multiple health conditions.    Jasmine Mesi, MD

## 2024-05-05 ENCOUNTER — Other Ambulatory Visit (INDEPENDENT_AMBULATORY_CARE_PROVIDER_SITE_OTHER): Payer: Self-pay | Admitting: Family Medicine

## 2024-05-06 ENCOUNTER — Other Ambulatory Visit (INDEPENDENT_AMBULATORY_CARE_PROVIDER_SITE_OTHER): Payer: Self-pay | Admitting: Family Medicine

## 2024-05-06 DIAGNOSIS — I10 Essential (primary) hypertension: Secondary | ICD-10-CM

## 2024-05-06 DIAGNOSIS — F3289 Other specified depressive episodes: Secondary | ICD-10-CM

## 2024-05-18 ENCOUNTER — Ambulatory Visit (INDEPENDENT_AMBULATORY_CARE_PROVIDER_SITE_OTHER): Admitting: Family Medicine

## 2024-05-18 ENCOUNTER — Encounter (INDEPENDENT_AMBULATORY_CARE_PROVIDER_SITE_OTHER): Payer: Self-pay | Admitting: Family Medicine

## 2024-05-18 VITALS — BP 142/76 | HR 69 | Temp 98.0°F | Ht 71.0 in | Wt 328.0 lb

## 2024-05-18 DIAGNOSIS — E669 Obesity, unspecified: Secondary | ICD-10-CM

## 2024-05-18 DIAGNOSIS — F3289 Other specified depressive episodes: Secondary | ICD-10-CM

## 2024-05-18 DIAGNOSIS — E559 Vitamin D deficiency, unspecified: Secondary | ICD-10-CM | POA: Diagnosis not present

## 2024-05-18 DIAGNOSIS — I10 Essential (primary) hypertension: Secondary | ICD-10-CM

## 2024-05-18 DIAGNOSIS — Z6841 Body Mass Index (BMI) 40.0 and over, adult: Secondary | ICD-10-CM

## 2024-05-18 DIAGNOSIS — R7303 Prediabetes: Secondary | ICD-10-CM | POA: Diagnosis not present

## 2024-05-18 DIAGNOSIS — F5089 Other specified eating disorder: Secondary | ICD-10-CM | POA: Diagnosis not present

## 2024-05-18 MED ORDER — METFORMIN HCL 500 MG PO TABS
ORAL_TABLET | ORAL | 0 refills | Status: DC
Start: 2024-05-18 — End: 2024-06-15

## 2024-05-18 MED ORDER — TOPIRAMATE 50 MG PO TABS
ORAL_TABLET | ORAL | 0 refills | Status: DC
Start: 2024-05-18 — End: 2024-06-15

## 2024-05-18 MED ORDER — TRIAMTERENE-HCTZ 37.5-25 MG PO TABS: 1.0000 | ORAL_TABLET | Freq: Every day | ORAL | 0 refills | Status: DC

## 2024-05-18 MED ORDER — BUPROPION HCL ER (SR) 200 MG PO TB12
ORAL_TABLET | ORAL | 0 refills | Status: DC
Start: 2024-05-18 — End: 2024-06-15

## 2024-05-18 MED ORDER — LOSARTAN POTASSIUM 100 MG PO TABS: 100.0000 mg | ORAL_TABLET | Freq: Every day | ORAL | 0 refills | Status: DC

## 2024-05-18 MED ORDER — VITAMIN D (ERGOCALCIFEROL) 1.25 MG (50000 UNIT) PO CAPS: 50000.0000 [IU] | ORAL_CAPSULE | ORAL | 0 refills | Status: DC

## 2024-05-18 NOTE — Progress Notes (Signed)
 Bryan [name], D.O.  ABFM, ABOM Specializing in Clinical Bariatric Medicine  Office located at: [address] [place], [place]  [address]   Assessment and Plan:  No orders of the defined types were placed in this encounter.   Medications Discontinued During This Encounter  Medication Reason   chlorthalidone  (HYGROTON ) 25 MG tablet    metFORMIN  (GLUCOPHAGE ) 500 MG tablet Reorder   buPROPion  (WELLBUTRIN  SR) 200 MG 12 hr tablet Reorder   losartan  (COZAAR ) 100 MG tablet Reorder   Vitamin D , Ergocalciferol , (DRISDOL ) 1.25 MG (50000 UNIT) CAPS capsule Reorder     Meds ordered this encounter  Medications   Vitamin D , Ergocalciferol , (DRISDOL ) 1.25 MG (50000 UNIT) CAPS capsule    Sig: Take 1 capsule (50,000 Units total) by mouth every Sunday.    Dispense:  4 capsule    Refill:  0    30 d supply;  ** OV for RF **   Do not send RF request   metFORMIN  (GLUCOPHAGE ) 500 MG tablet    Sig: TAKE 1 TABLET(500 MG) BY MOUTH THREE TIMES DAILY    Dispense:  90 tablet    Refill:  0   losartan  (COZAAR ) 100 MG tablet    Sig: Take 1 tablet (100 mg total) by mouth daily.    Dispense:  30 tablet    Refill:  0    30 d supply;  ** OV for RF **   Do not send RF request   buPROPion  (WELLBUTRIN  SR) 200 MG 12 hr tablet    Sig: 1 po BID    Dispense:  60 tablet    Refill:  0    30 d supply;  ** OV for RF **   Do not send RF request   triamterene -hydrochlorothiazide  (MAXZIDE-25) 37.5-25 MG tablet    Sig: Take 1 tablet by mouth daily.    Dispense:  30 tablet    Refill:  0   topiramate  (TOPAMAX ) 50 MG tablet    Sig: One half tab by mouth daily for a week, then twice daily    Dispense:  30 tablet    Refill:  0    Pt agreed to come for fasting labs 2-3 business days prior to his next OV with Dr. Verdon OR come fasting to next OV fasting.   FOR THE DISEASE OF OBESITY: BMI 40.0-44.9, adult (HCC) -- Current BMI 45.77 Obesity, Beginning BMI 46.78 Assessment & Plan: Since last office visit  with Dr. Verdon on 03/29/24 patient's muscle mass has increased by 0. 2lb. Fat mass has increased by 9.4 lbs. Total body water has increased by 8.6 lbs.  Counseling done on how various foods will affect these numbers and how to maximize success  Total lbs lost to date: gained 2 lbs Total weight loss percentage to date: +0.61%   Recommended Dietary Goals Bryan [name] is currently in the action stage of change. As such, his goal is to continue weight management plan.  He has agreed to: continue current plan   Behavioral Intervention We discussed the following today: increasing lean protein intake to established goals, decreasing simple carbohydrates , increasing water intake , decreasing eating out or consumption of processed foods, and making healthy choices when eating convenient foods, work on managing stress, creating time for self-care and relaxation, and avoiding temptations and identifying enticing environmental cues  Additional resources provided today: None  Evidence-based interventions for health behavior change were utilized today including the discussion of self monitoring techniques, problem-solving barriers and  SMART goal setting techniques.   Regarding patient's less desirable eating habits and patterns, we employed the technique of small changes.   Pt will specifically work on: Increase exercise to walking 40 minutes 5 days per week and meal prep/plan more frequently for next visit.    Recommended Physical Activity Goals Bryan [name] has been advised to work up to 300-450 minutes of moderate intensity aerobic activity a week and strengthening exercises 2-3 times per week for cardiovascular health, weight loss maintenance and preservation of muscle mass.   He has agreed to: Increase the intensity, frequency or duration of aerobic exercises  ; see goal above for details   Pharmacotherapy We both agreed to: Continue with current nutritional and behavioral strategies   ASSOCIATED  CONDITIONS ADDRESSED TODAY: Essential hypertension Assessment & Plan: BP Readings from Last 3 Encounters:  05/18/24 (!) 142/76  03/29/24 (!) 142/75  02/23/24 133/78   His HCTZ was discontinued and Chlorthalidone  was initiated by Dr. Verdon on 03/29/24. Since then, he believes he has been retaining fluid and restarted HCTZ on his own last week. States he has lost about 10 lbs of water weight since restarting HCTZ. Biometric scale shows his total body water mass increased from 134.4 on 4/23 to 143 lbs today. Systolic BP ranges 120s-low 130s and diastolic 70s-80s. He is only checking BP once weekly.   Mutually agreed to discontinue Chlorthalidone  and start Triamterene -HCTZ 37.5-25 mg once daily. Will also refill Losartan  today with no dose changes. I do not recommend he start Phentermine due to his hx of A-fib. Encouraged pt to monitor BP 3x/week at random times. Decrease simple carbs/sugars, increase water intake, and increase exercise in an effort to improve condition and overall health.   Orders: - START Triamterene -HCTZ  - Refill Losartan  100 mg   Pre-diabetes Assessment & Plan: Lab Results  Component Value Date   HGBA1C 6.4 (H) 08/19/2023   HGBA1C 5.9 (H) 08/13/2022   HGBA1C 5.8 09/15/2021   INSULIN  15.1 08/19/2023   INSULIN  9.7 08/13/2022   INSULIN  22.6 02/10/2021    Compliant with Metformin  500 mg TID. Tolerating well with no adverse side effects reported last A1c was 6.4 as of 08/19/23. Pt does report off plan eating and emotional eating.   Continue current med regimen as prescribed. Encourage patient to work on following prudent nutritional meal plan and regular exercise. Will plan to recheck A1c a few days prior or on next OV.  Orders: - Refill Metformin  500 TID   Emotional Eating Behavior Assessment & Plan: Cravings and hunger are uncontrolled; Cravings > hunger per pt. He admits to snacking in front of the TV at nighttime. Discuss the option of starting Topamax  for a  better control of hunger, cravings, and emotional eating. Reviewed benefits and potential risks/SE. Mutually agreed to initiate Topamax  today. Start with half tab (25 mg) once daily for a week then increase to half tab BID if tolerating well, with goal to eventually increase to full tab (50 mg) BID. Encouraged pt to start journaling intake for 1-2 weeks to improve mindfulness.   Orders: - START Topamax  50 mg; see dose details above - Refill Wellbutrin  SR, no dose changes   Vitamin D  deficiency Assessment & Plan: Lab Results  Component Value Date   VD25OH 61.5 08/19/2023   VD25OH 49.8 08/13/2022   VD25OH 55.45 09/15/2021   Compliant with ERGO 50 K units weekly, every Sunday. Tolerating well, no SE. Continue current supplementation regimen, refilling ERGO today. Recheck periodically.  Orders: - Refill ERGO,  no dose changes  Follow up:   Return in about 4 weeks (around 06/15/2024) for Fasting labs 3d prior to next appt with Dr. Verdon on 7/10 and make another appt with Dr. Midge. He was informed of the importance of frequent follow up visits to maximize his success with intensive lifestyle modifications for his multiple health conditions.  Subjective:   Chief complaint: Obesity Bryan [name] is here to discuss his progress with his obesity treatment plan. He is on the Category 3 Plan and states he is following his eating plan approximately 50% of the time. He states he is walking 30-40 minutes 3 days per week.  Interval History:  Bryan [name] is here for a follow up office visit. Since last OV with Dr. Verdon on 03/29/24, he is up 10 lbs.He has been struggling to stay on plan, which he attributes to emotional eating. Reports he tends to snack while watching TV at night time.  Pharmacotherapy for weight loss: He is currently taking Wellbutrin  SR 200 mg BID and Metformin  500 mg TID.   Review of Systems:  Pertinent positives were addressed with patient today.  Reviewed by clinician on  day of visit: allergies, medications, problem list, medical history, surgical history, family history, social history, and previous encounter notes.  Weight Summary and Biometrics   Weight Lost Since Last Visit: 0lb  Weight Gained Since Last Visit: 10lb  ***  Vitals Temp: 98 F (36.7 C) BP: (!) 142/76 Pulse Rate: 69 SpO2: 98 %   Anthropometric Measurements Height: 5' 11 (1.803 m) Weight: (!) 328 lb (148.8 kg) BMI (Calculated): 45.77 Weight at Last Visit: 318lb Weight Lost Since Last Visit: 0lb Weight Gained Since Last Visit: 10lb Starting Weight: 326lb Total Weight Loss (lbs): 0 lb (0 kg)   Body Composition  Body Fat %: 41.4 % Fat Mass (lbs): 136 lbs Muscle Mass (lbs): 183 lbs Total Body Water (lbs): 143 lbs Visceral Fat Rating : 29   Other Clinical Data Fasting: No Labs: No Today's Visit #: 97 Starting Date: 09/08/17    Objective:   PHYSICAL EXAM: Blood pressure (!) 142/76, pulse 69, temperature 98 F (36.7 C), height 5' 11 (1.803 m), weight (!) 328 lb (148.8 kg), SpO2 98%. Body mass index is 45.75 kg/m.  General: he is overweight, cooperative and in no acute distress. PSYCH: Has normal mood, affect and thought process.   HEENT: EOMI, sclerae are anicteric. Lungs: Normal breathing effort, no conversational dyspnea. Extremities: Moves * 4 Neurologic: A and O * 3, good insight  DIAGNOSTIC DATA REVIEWED: BMET    Component Value Date/Time   NA 141 08/19/2023 1443   K 4.0 08/19/2023 1443   CL 98 08/19/2023 1443   CO2 23 08/19/2023 1443   GLUCOSE 96 08/19/2023 1443   GLUCOSE 135 (H) 09/06/2021 1035   BUN 13 08/19/2023 1443   CREATININE 0.81 08/19/2023 1443   CALCIUM  9.4 08/19/2023 1443   GFRNONAA >60 09/06/2021 1035   GFRAA 96 05/29/2020 1417   Lab Results  Component Value Date   HGBA1C 6.4 (H) 08/19/2023   HGBA1C 5.9 08/15/2013   Lab Results  Component Value Date   INSULIN  15.1 08/19/2023   INSULIN  21.9 09/08/2017   Lab Results   Component Value Date   TSH 1.430 08/19/2023   CBC    Component Value Date/Time   WBC 6.5 08/19/2023 1443   WBC 11.8 (H) 09/06/2021 1035   RBC 5.17 08/19/2023 1443   RBC 5.94 (H) 09/06/2021 1035   HGB 15.1  08/19/2023 1443   HCT 46.1 08/19/2023 1443   PLT 239 08/19/2023 1443   MCV 89 08/19/2023 1443   MCH 29.2 08/19/2023 1443   MCH 28.5 09/06/2021 1035   MCHC 32.8 08/19/2023 1443   MCHC 33.9 09/06/2021 1035   RDW 13.1 08/19/2023 1443   Iron Studies No results found for: IRON, TIBC, FERRITIN, IRONPCTSAT Lipid Panel     Component Value Date/Time   CHOL 141 11/03/2023 0810   CHOL 182 08/19/2023 1443   TRIG 260.0 (H) 11/03/2023 0810   HDL 39.00 (L) 11/03/2023 0810   HDL 47 08/19/2023 1443   CHOLHDL 4 11/03/2023 0810   VLDL 52.0 (H) 11/03/2023 0810   LDLCALC 50 11/03/2023 0810   LDLCALC 89 08/19/2023 1443   LDLDIRECT 136.0 11/16/2016 0902   Hepatic Function Panel     Component Value Date/Time   PROT 7.5 08/19/2023 1443   ALBUMIN 4.6 08/19/2023 1443   AST 19 11/03/2023 0810   ALT 40 11/03/2023 0810   ALKPHOS 61 08/19/2023 1443   BILITOT 0.6 08/19/2023 1443   BILIDIR 0.0 02/26/2012 1353      Component Value Date/Time   TSH 1.430 08/19/2023 1443   Nutritional Lab Results  Component Value Date   VD25OH 61.5 08/19/2023   VD25OH 49.8 08/13/2022   VD25OH 55.45 09/15/2021    Attestations:   I, Bryan [name], acting as a medical scribe for Bryan [name], DO., have compiled all relevant documentation for today's office visit on behalf of Bryan [name], DO, while in the presence of [employer], DO.  Reviewed by clinician on day of visit: allergies, medications, problem list, medical history, surgical history, family history, social history, and previous encounter notes pertinent to patient's obesity diagnosis.  I have reviewed the above documentation for accuracy and completeness, and I agree with the above. Bryan [name], D.O.  The 21st Century  Cures Act was signed into law in 2016 which includes the topic of electronic health records.  This provides immediate access to information in MyChart.  This includes consultation notes, operative notes, office notes, lab results and pathology reports.  If you have any questions about what you read please let us  know at your next visit so we can discuss your concerns and take corrective action if need be.  We are right here with you.

## 2024-05-29 ENCOUNTER — Emergency Department (HOSPITAL_BASED_OUTPATIENT_CLINIC_OR_DEPARTMENT_OTHER)

## 2024-05-29 ENCOUNTER — Emergency Department (HOSPITAL_BASED_OUTPATIENT_CLINIC_OR_DEPARTMENT_OTHER)
Admission: EM | Admit: 2024-05-29 | Discharge: 2024-05-29 | Disposition: A | Discharge status: Home / Self Care | Attending: Emergency Medicine | Admitting: Emergency Medicine

## 2024-05-29 ENCOUNTER — Other Ambulatory Visit: Payer: Self-pay

## 2024-05-29 ENCOUNTER — Encounter (HOSPITAL_BASED_OUTPATIENT_CLINIC_OR_DEPARTMENT_OTHER): Payer: Self-pay

## 2024-05-29 DIAGNOSIS — R0602 Shortness of breath: Secondary | ICD-10-CM | POA: Insufficient documentation

## 2024-05-29 DIAGNOSIS — Z7982 Long term (current) use of aspirin: Secondary | ICD-10-CM | POA: Diagnosis not present

## 2024-05-29 DIAGNOSIS — I10 Essential (primary) hypertension: Secondary | ICD-10-CM | POA: Insufficient documentation

## 2024-05-29 DIAGNOSIS — R600 Localized edema: Secondary | ICD-10-CM | POA: Insufficient documentation

## 2024-05-29 DIAGNOSIS — Z79899 Other long term (current) drug therapy: Secondary | ICD-10-CM | POA: Diagnosis not present

## 2024-05-29 LAB — HEPATIC FUNCTION PANEL
ALT: 43 U/L (ref 0–44)
AST: 25 U/L (ref 15–41)
Albumin: 4.6 g/dL (ref 3.5–5.0)
Alkaline Phosphatase: 64 U/L (ref 38–126)
Bilirubin, Direct: 0.1 mg/dL (ref 0.0–0.2)
Indirect Bilirubin: 0.2 mg/dL — ABNORMAL LOW (ref 0.3–0.9)
Total Bilirubin: 0.3 mg/dL (ref 0.0–1.2)
Total Protein: 7.7 g/dL (ref 6.5–8.1)

## 2024-05-29 LAB — BASIC METABOLIC PANEL WITH GFR
Anion gap: 16 — ABNORMAL HIGH (ref 5–15)
BUN: 16 mg/dL (ref 6–20)
CO2: 23 mmol/L (ref 22–32)
Calcium: 9.7 mg/dL (ref 8.9–10.3)
Chloride: 99 mmol/L (ref 98–111)
Creatinine, Ser: 0.99 mg/dL (ref 0.61–1.24)
GFR, Estimated: >60 mL/min (ref >60)
Glucose, Bld: 138 mg/dL — ABNORMAL HIGH (ref 70–99)
Potassium: 3.7 mmol/L (ref 3.5–5.1)
Sodium: 137 mmol/L (ref 135–145)

## 2024-05-29 LAB — CBC
HCT: 45.2 % (ref 39.0–52.0)
Hemoglobin: 15 g/dL (ref 13.0–17.0)
MCH: 29 pg (ref 26.0–34.0)
MCHC: 33.2 g/dL (ref 30.0–36.0)
MCV: 87.4 fL (ref 80.0–100.0)
Platelets: 261 10*3/uL (ref 150–400)
RBC: 5.17 MIL/uL (ref 4.22–5.81)
RDW: 13.4 % (ref 11.5–15.5)
WBC: 8.5 10*3/uL (ref 4.0–10.5)
nRBC: 0 % (ref 0.0–0.2)

## 2024-05-29 LAB — TROPONIN T, HIGH SENSITIVITY
Troponin T High Sensitivity: <15 ng/L (ref <19)
Troponin T High Sensitivity: <15 ng/L (ref <19)

## 2024-05-29 LAB — PRO BRAIN NATRIURETIC PEPTIDE: Pro Brain Natriuretic Peptide: <36 pg/mL (ref <300.0)

## 2024-05-29 NOTE — ED Provider Notes (Signed)
 Bryan [name] Provider Note   CSN: [id number] Arrival date & time: 05/29/24  1531     Patient presents with: Shortness of Breath   Bryan [name] is a 63 y.o. male whopresents with a cc of SOB. He has a pmh of Pre-diabetes, HLD , HTN, Sleep apnea compliant with Cpap, Obesity, afib [name]/p ablation and follow with EP cards. Pt reports increasing exertional dyspnea for the past month. He states that he'[name] gets very winded by the time he gets to the top of a flight of stairs. He denies associated nausea, diaphoresis chest pain.  Patient reports that he was at the beach a few weeks ago and became extremely swollen.  He felt more short of breath at this time.  He had at least a 10 pound weight gain that has come off on its own.  He is also working with a wellness center for weight loss.  His wife reports that she has noticed his ankles have been more swollen recently.  He denies orthopnea or PND.  Today at work he had onset of intermittent sensation of shortness of breath at rest.  He tried to take slow deep breaths and make sure he was staying calm but the sensation went fairly overwhelming.  He had mild nausea he denies chest pain.  He came here for further evaluation.    Shortness of Breath      Prior to Admission medications   Medication Sig Start Date End Date Taking? Authorizing Provider  aspirin EC 81 MG tablet Take 81 mg by mouth daily. Swallow whole.    Provider, Historical, Bryan [name]  atorvastatin  (LIPITOR) 40 MG tablet Take 1 tablet (40 mg total) by mouth at bedtime. 11/08/23   Bryan [name], Bryan [name], Bryan [name]  azelastine  (ASTELIN ) 0.1 % nasal spray Place 2 sprays into both nostrils at bedtime as needed for rhinitis. Use in each nostril as directed 03/14/19   Bryan [name], Bryan [name], Bryan [name]  b complex vitamins capsule Take 1 capsule by mouth daily.    Provider, Historical, Bryan [name]  benzonatate  (TESSALON ) 100 MG capsule Take 1 capsule (100 mg total) by mouth 2 (two) times daily as needed for  cough. 09/06/23   Bryan Charleston, Bryan [name]  benzonatate  (TESSALON ) 200 MG capsule Take 1 capsule (200 mg total) by mouth 2 (two) times daily as needed for cough. 05/10/23   Bryan Jon HERO, Bryan [name]  buPROPion  (WELLBUTRIN  SR) 200 MG 12 hr tablet 1 po BID 05/18/24   Bryan [name], Deborah, Bryan [name]  CO ENZYME Q-10 PO Take 1 capsule by mouth in the morning.    Provider, Historical, Bryan [name]  diltiazem  (CARDIZEM ) 30 MG tablet Take 1 - 2 tablets by mouth every 6 hours as needed for heart rate greater than 130 05/25/21   Bryan [name], Bryan [name], Bryan [name]  famotidine-calcium  carbonate-magnesium hydroxide (PEPCID COMPLETE) [phone] MG chewable tablet Chew 1 tablet by mouth daily as needed (acid reflux).    Provider, Historical, Bryan [name]  fish oil-omega-3 fatty acids 1000 MG capsule Take 1 g by mouth in the morning.    Provider, Historical, Bryan [name]  fluticasone  (FLONASE ) 50 MCG/ACT nasal spray Place 2 sprays into both nostrils daily. 05/10/23   Bryan [name], Bryan [name], Bryan [name]  ibuprofen (ADVIL) 200 MG tablet Take 400 mg by mouth every 6 (six) hours as needed for moderate pain.    Provider, Historical, Bryan [name]  losartan  (COZAAR ) 100 MG tablet Take 1 tablet (100 mg total) by mouth daily. 05/18/24   Bryan [name], Barnie, Bryan [name]  MELATONIN PO Take  1 tablet by mouth at bedtime. With valerian root    Provider, Historical, Bryan [name]  metFORMIN  (GLUCOPHAGE ) 500 MG tablet TAKE 1 TABLET(500 MG) BY MOUTH THREE TIMES DAILY 05/18/24   Bryan [name], Barnie, Bryan [name]  metoprolol  succinate (TOPROL -XL) 25 MG 24 hr tablet TAKE 1 TABLET(25 MG) BY MOUTH AT BEDTIME 12/10/23   Bryan [name] DASEN, Bryan [name]  Multiple Vitamins-Minerals (CENTRUM PO) Take 1 tablet by mouth in the morning.    Provider, Historical, Bryan [name]  omeprazole (PRILOSEC OTC) 20 MG tablet Take 20 mg by mouth daily as needed (acid reflux).    Provider, Historical, Bryan [name]  topiramate  (TOPAMAX ) 50 MG tablet One half tab by mouth daily for a week, then twice daily 05/18/24   Bryan [name], Barnie, Bryan [name]  triamterene -hydrochlorothiazide  (MAXZIDE-25) 37.5-25 MG tablet Take 1 tablet by mouth  daily. 05/18/24   Bryan [name], Barnie, Bryan [name]  Vitamin D , Ergocalciferol , (DRISDOL ) 1.25 MG (50000 UNIT) CAPS capsule Take 1 capsule (50,000 Units total) by mouth every Sunday. 05/21/24   Bryan Barnie, Bryan [name]    Allergies: Lisinopril     Review of Systems  Respiratory:  Positive for shortness of breath.     Updated Vital Signs BP (!) 146/96 (BP Location: Right Arm)   Pulse 84   Temp 98.4 F (36.9 C) (Oral)   Resp 20   Ht 5' 11 (1.803 [name])   Wt (!) 145.2 kg   SpO2 99%   BMI 44.63 kg/[name]   Physical Exam Vitals and nursing note reviewed.  Constitutional:      General: He is not in acute distress.    Appearance: He is well-developed. He is obese. He is not diaphoretic.  HENT:     Head: Normocephalic and atraumatic.   Eyes:     General: No scleral icterus.    Conjunctiva/sclera: Conjunctivae normal.    Cardiovascular:     Rate and Rhythm: Normal rate and regular rhythm.     Heart sounds: Normal heart sounds.  Pulmonary:     Effort: Pulmonary effort is normal. No respiratory distress.     Breath sounds: Normal breath sounds. No decreased breath sounds, wheezing, rhonchi or rales.  Abdominal:     Palpations: Abdomen is soft.     Tenderness: There is no abdominal tenderness.   Musculoskeletal:     Cervical back: Normal range of motion and neck supple.     Right lower leg: 1+ Edema present.     Left lower leg: 1+ Edema present.   Skin:    General: Skin is warm and dry.   Neurological:     Mental Status: He is alert.   Psychiatric:        Behavior: Behavior normal.     (all labs ordered are listed, but only abnormal results are displayed) Labs Reviewed  CBC  BASIC METABOLIC PANEL WITH GFR    EKG: None  Radiology: No results found.   Procedures   Medications Ordered in the ED - No data to display  Clinical Course as of 05/29/24 Bryan [name] May 29, 2024  8161 Basic metabolic panel(!) [AH]  1838 CBC [AH]  8161 Hepatic function panel(!) [AH]  8161 Troponin T,  High Sensitivity Negative x2  [AH]  1838 Pro Brain natriuretic peptide [AH]    Clinical Course User Index [AH] Bryan Chroman, Bryan [name]                                 Medical Decision Making .Given the  large differential diagnosis for Charlie KATHEE Franks, the decision making in this case is of high complexity.  After evaluating all of the data points in this case, the presentation of JACHOB MCCLEAN is NOT consistent with Acute Coronary Syndrome (ACS) and/or myocardial ischemia, pulmonary embolism, aortic dissection; Borhaave'[name], significant arrythmia, pneumothorax, cardiac tamponade, or other emergent cardiopulmonary condition.  Further, the presentation of KWASI JOUNG is NOT consistent with pericarditis, myocarditis, cholecystitis, pancreatitis, mediastinitis, endocarditis, new valvular disease.  Additionally, the presentation of Coulson B Catesis NOT consistent with flail chest, cardiac contusion, ARDS, or significant intra-thoracic or intra-abdominal bleeding.  Moreover, this presentation is NOT consistent with pneumonia, sepsis, or pyelonephritis.  The patient has a HEART Score: 4    Strict return and follow-up precautions have been given by me personally or by detailed written instruction given verbally by nursing staff using the teach back method to the patient/family/caregiver([name]).  Data Reviewed/Counseling: I have reviewed the patient'[name] vital signs, nursing notes, and other relevant tests/information. I had a detailed discussion regarding the historical points, exam findings, and any diagnostic results supporting the discharge diagnosis. I also discussed the need for outpatient follow-up and the need to return to the ED if symptoms worsen or if there are any questions or concerns that arise at home.    Amount and/or Complexity of Data Reviewed Labs: ordered. Decision-making details documented in ED Course. Radiology: ordered and independent interpretation performed. ECG/medicine  tests: ordered and independent interpretation performed.        Final diagnoses:  None    ED Discharge Orders     None          Bryan Chroman, Bryan [name] 05/29/24 1846    Armenta Canning, Bryan [name] 06/01/24 1345

## 2024-05-29 NOTE — ED Triage Notes (Signed)
 Pt presents with gradual worsening with ShOB over the past week with congested cough. He wears a CPAP at night, but he feels it is not helping as much as it usually does. Pt also reports having a vacation in the last month and had a lot of swelling in his LE, face, and abd and noted a sudden increase in weight of 10 lbs.

## 2024-05-29 NOTE — Discharge Instructions (Signed)
Get help right away if: Your shortness of breath gets worse. You have shortness of breath when you are resting. You feel light-headed or you faint. You have a cough that is not controlled with medicines. You cough up blood. You have pain with breathing. You have pain in your chest, arms, shoulders, or abdomen. You have a fever. These symptoms may be an emergency. Get help right away. Call 911. Do not wait to see if the symptoms will go away. Do not drive yourself to the hospital. 

## 2024-06-05 ENCOUNTER — Other Ambulatory Visit (INDEPENDENT_AMBULATORY_CARE_PROVIDER_SITE_OTHER): Payer: Self-pay | Admitting: Family Medicine

## 2024-06-05 DIAGNOSIS — R7303 Prediabetes: Secondary | ICD-10-CM

## 2024-06-05 DIAGNOSIS — F3289 Other specified depressive episodes: Secondary | ICD-10-CM

## 2024-06-05 DIAGNOSIS — I10 Essential (primary) hypertension: Secondary | ICD-10-CM

## 2024-06-06 ENCOUNTER — Ambulatory Visit: Admitting: Internal Medicine

## 2024-06-06 VITALS — BP 136/70 | HR 85 | Temp 98.4°F | Resp 18 | Ht 71.0 in | Wt 328.5 lb

## 2024-06-06 DIAGNOSIS — R0602 Shortness of breath: Secondary | ICD-10-CM | POA: Diagnosis not present

## 2024-06-06 DIAGNOSIS — R053 Chronic cough: Secondary | ICD-10-CM | POA: Diagnosis not present

## 2024-06-06 DIAGNOSIS — J4520 Mild intermittent asthma, uncomplicated: Secondary | ICD-10-CM | POA: Diagnosis not present

## 2024-06-06 LAB — D-DIMER, QUANTITATIVE: D-Dimer, Quant: <0.19 ug{FEU}/mL (ref <0.50)

## 2024-06-06 MED ORDER — BUDESONIDE-FORMOTEROL FUMARATE 160-4.5 MCG/ACT IN AERO: 2.0000 | INHALATION_SPRAY | Freq: Two times a day (BID) | RESPIRATORY_TRACT | 3 refills | Status: DC

## 2024-06-06 NOTE — Progress Notes (Unsigned)
 Subjective:    Patient ID: Bryan [name], male    DOB: 08/21/1961, 63 y.o.   MRN: [id number]  DOS:  06/06/2024 Type of visit - description: ER follow-up  Went to the ER 05/29/2024 Had SOB, described as could not take a deep breath. No nausea, chest pain.  All these have been in the context of: Flying to [place] in [place] 2025. Going to the beach the first week of June, driving several hours. While on the beach on medication had a significant amount of weight gain, generalized edema including both legs,  symmetrically. Did not have any particular calf pain.  Workup in the ER: BMP okay.  CBC normal.  LFTs normal.  BMP negative.  Troponin negative.  Chest x-ray no acute.  EKG unchanged from previous.  He also has a nagging cough which is chronic and  because he was SOB he decided to try leftover inhaler from his wife and that seemed to help.  At this point, the shortness of breath has diminish significantly but is not completely gone.    Review of Systems See above   Past Medical History:  Diagnosis Date   Allergy    Dental crowns present    Difficult intubation    Hyperlipidemia    Hypertension    Lower back pain    Nephrolithiasis 2017   Prediabetes 09/04/2014   Seasonal allergies    Sebaceous cyst 04/2013   upper back   Sleep apnea    uses CPAP nightly   Urolithiasis 10/2016    Past Surgical History:  Procedure Laterality Date   A-FLUTTER ABLATION N/A 01/19/2022   Procedure: A-FLUTTER ABLATION;  Surgeon: Cindie Ole DASEN, MD;  Location: [facility];  Service: Cardiovascular;  Laterality: N/A;   ADENOIDECTOMY     as a child   bone marrow donor     under anesthesia   CHOLECYSTECTOMY  01/12/2012   Procedure: LAPAROSCOPIC CHOLECYSTECTOMY WITH INTRAOPERATIVE CHOLANGIOGRAM;  Surgeon: Donnice POUR. Belinda, MD;  Location: [facility];  Service: General;  Laterality: N/A;   COLONOSCOPY     COLONOSCOPY WITH PROPOFOL  N/A 12/15/2022   Procedure: COLONOSCOPY WITH  PROPOFOL ;  Surgeon: Abran Norleen SAILOR, MD;  Location: [facility];  Service: Gastroenterology;  Laterality: N/A;   CYST EXCISION     cyst removal from back   EAR CYST EXCISION N/A 05/04/2013   Procedure: Excision subcutaneous mass posterior neck;  Surgeon: Donnice POUR. Tsuei, MD;  Location: [facility];  Service: General;  Laterality: N/A;   POLYPECTOMY     POLYPECTOMY  12/15/2022   Procedure: POLYPECTOMY;  Surgeon: Abran Norleen SAILOR, MD;  Location: [facility];  Service: Gastroenterology;;   wisdom teeth      Current Outpatient Medications  Medication Instructions   aspirin EC 81 mg, Daily   atorvastatin  (LIPITOR) 40 mg, Oral, Daily at bedtime   azelastine  (ASTELIN ) 0.1 % nasal spray 2 sprays, Each Nare, At bedtime PRN, Use in each nostril as directed    b complex vitamins capsule 1 capsule, Daily   benzonatate  (TESSALON ) 200 mg, Oral, 2 times daily PRN   budesonide-formoterol (SYMBICORT) 160-4.5 MCG/ACT inhaler 2 puffs, Inhalation, 2 times daily   buPROPion  (WELLBUTRIN  SR) 200 MG 12 hr tablet 1 po BID   CO ENZYME Q-10 PO 1 capsule, Every morning   diltiazem  (CARDIZEM ) 30 MG tablet Take 1 - 2 tablets by mouth every 6 hours as needed for heart rate greater than 130   famotidine-calcium  carbonate-magnesium hydroxide (  PEPCID COMPLETE) 10-800-165 MG chewable tablet 1 tablet, Daily PRN   fish oil-omega-3 fatty acids 1 g, Every morning   fluticasone  (FLONASE ) 50 MCG/ACT nasal spray 2 sprays, Each Nare, Daily   ibuprofen (ADVIL) 400 mg, Every 6 hours PRN   losartan  (COZAAR ) 100 mg, Oral, Daily   MELATONIN PO 1 tablet, Daily at bedtime   metFORMIN  (GLUCOPHAGE ) 500 MG tablet TAKE 1 TABLET(500 MG) BY MOUTH THREE TIMES DAILY   metoprolol  succinate (TOPROL -XL) 25 MG 24 hr tablet TAKE 1 TABLET(25 MG) BY MOUTH AT BEDTIME   Multiple Vitamins-Minerals (CENTRUM PO) 1 tablet, Every morning   omeprazole (PRILOSEC OTC) 20 mg, Daily PRN   topiramate  (TOPAMAX ) 50 MG tablet One half tab by mouth daily  for a week, then twice daily   triamterene -hydrochlorothiazide  (MAXZIDE-25) 37.5-25 MG tablet 1 tablet, Oral, Daily   Vitamin D  (Ergocalciferol ) (DRISDOL ) 50,000 Units, Oral, Every Sun       Objective:   Physical Exam BP 136/70   Pulse 85   Temp 98.4 F (36.9 C) (Oral)   Resp 18   Ht 5' 11 (1.803 m)   Wt (!) 328 lb 8 oz (149 kg)   SpO2 96%   BMI 45.82 kg/m  General:   Well developed, NAD, BMI noted. HEENT:  Normocephalic . Face symmetric, atraumatic Lungs:  CTA B Normal respiratory effort, no intercostal retractions, no accessory muscle use. Heart: RRR,  no murmur.  Lower extremities: Calves symmetric, no TTP.  No pitting edema  skin: Not pale. Not jaundice Neurologic:  alert & oriented X3.  Speech normal, gait appropriate for age and unassisted Psych--  Cognition and judgment appear intact.  Cooperative with normal attention span and concentration.  Behavior appropriate. No anxious or depressed appearing.      Assessment   Assessment Prediabetes HTN Hyperlipidemia CV: Atrial flutter, ablation 01-2022 --> NSR.  Eliquis  discontinue June 2023. Insomnia: on OTCs Morbid obesity, BMI 46 OSA, CPAP Seasonal allergies Kidney stone (first) 10-2016 Vit D def dx 09/2017 Retina detachment DX 06-2019  PLAN: Shortness of breath: As described above, in the context of going to the beach and developing significant fluid retention bilateral lower extremity edema about 2 to 3 weeks prior to symptoms. Workup at the ER  negative including a BNP. Also, has cough which is chronic so he decided to try a inhaler from his wife and that seemed to help the cough and the difficulty breathing. No history of asthma however at some point he was prescribed a inhalers. Plan: To be sure, we will check a D-dimer, he is aware that this is a screening for PE and if + will need CT. Symbicort trial   Call if not completely back to normal in the next couple of weeks. Reactive airway disease?:   See above RTC 3 months

## 2024-06-06 NOTE — Patient Instructions (Signed)
 Start Symbicort: 2 puffs twice a day until you see me next. Rinse your throat after every use  If you are not gradually better let me know. Seek immediate medical attention if severe difficulty breathing or pain in your chest.  GO TO THE LAB :  Get the blood work   Your results will be posted on MyChart with my comments  Next office visit for a checkup in 3 months Please make an appointment before you leave today

## 2024-06-07 ENCOUNTER — Ambulatory Visit: Payer: Self-pay | Admitting: Internal Medicine

## 2024-06-07 NOTE — Assessment & Plan Note (Signed)
 Shortness of breath: As described above, in the context of going to the beach and developing significant fluid retention bilateral lower extremity edema about 2 to 3 weeks prior to symptoms. Workup at the ER  negative including a BNP. Also, has cough which is chronic so he decided to try a inhaler from his wife and that seemed to help the cough and the difficulty breathing. No history of asthma however at some point he was prescribed a inhalers. Plan: To be sure, we will check a D-dimer, he is aware that this is a screening for PE and if + will need CT. Symbicort trial   Call if not completely back to normal in the next couple of weeks. Reactive airway disease?:  See above RTC 3 months

## 2024-06-15 ENCOUNTER — Encounter (INDEPENDENT_AMBULATORY_CARE_PROVIDER_SITE_OTHER): Payer: Self-pay | Admitting: Family Medicine

## 2024-06-15 ENCOUNTER — Telehealth (INDEPENDENT_AMBULATORY_CARE_PROVIDER_SITE_OTHER): Payer: Self-pay

## 2024-06-15 ENCOUNTER — Ambulatory Visit (INDEPENDENT_AMBULATORY_CARE_PROVIDER_SITE_OTHER): Admitting: Family Medicine

## 2024-06-15 VITALS — BP 126/77 | HR 66 | Temp 97.7°F | Ht 71.0 in | Wt 323.0 lb

## 2024-06-15 DIAGNOSIS — E669 Obesity, unspecified: Secondary | ICD-10-CM

## 2024-06-15 DIAGNOSIS — F3289 Other specified depressive episodes: Secondary | ICD-10-CM

## 2024-06-15 DIAGNOSIS — Z6841 Body Mass Index (BMI) 40.0 and over, adult: Secondary | ICD-10-CM

## 2024-06-15 DIAGNOSIS — R7303 Prediabetes: Secondary | ICD-10-CM

## 2024-06-15 DIAGNOSIS — I10 Essential (primary) hypertension: Secondary | ICD-10-CM | POA: Diagnosis not present

## 2024-06-15 DIAGNOSIS — F5089 Other specified eating disorder: Secondary | ICD-10-CM

## 2024-06-15 DIAGNOSIS — E66813 Obesity, class 3: Secondary | ICD-10-CM

## 2024-06-15 DIAGNOSIS — G4733 Obstructive sleep apnea (adult) (pediatric): Secondary | ICD-10-CM | POA: Diagnosis not present

## 2024-06-15 DIAGNOSIS — E785 Hyperlipidemia, unspecified: Secondary | ICD-10-CM

## 2024-06-15 DIAGNOSIS — E559 Vitamin D deficiency, unspecified: Secondary | ICD-10-CM

## 2024-06-15 DIAGNOSIS — E782 Mixed hyperlipidemia: Secondary | ICD-10-CM

## 2024-06-15 MED ORDER — METFORMIN HCL 500 MG PO TABS: ORAL_TABLET | ORAL | 0 refills | Status: DC

## 2024-06-15 MED ORDER — VITAMIN D (ERGOCALCIFEROL) 1.25 MG (50000 UNIT) PO CAPS: 50000.0000 [IU] | ORAL_CAPSULE | ORAL | 0 refills | Status: DC

## 2024-06-15 MED ORDER — LOSARTAN POTASSIUM 100 MG PO TABS: 100.0000 mg | ORAL_TABLET | Freq: Every day | ORAL | 0 refills | Status: DC

## 2024-06-15 MED ORDER — TOPIRAMATE 50 MG PO TABS: ORAL_TABLET | ORAL | 0 refills | Status: DC

## 2024-06-15 MED ORDER — BUPROPION HCL ER (SR) 200 MG PO TB12: ORAL_TABLET | ORAL | 0 refills | Status: DC

## 2024-06-15 MED ORDER — TRIAMTERENE-HCTZ 37.5-25 MG PO TABS: 1.0000 | ORAL_TABLET | Freq: Every day | ORAL | 0 refills | Status: DC

## 2024-06-15 NOTE — Progress Notes (Signed)
 Office: [phone]  /  Fax: [phone]  WEIGHT SUMMARY AND BIOMETRICS  Anthropometric Measurements Height: 5' 11 (1.803 m) Weight: (!) 323 lb (146.5 kg) BMI (Calculated): 45.07 Weight at Last Visit: 328 lb Weight Lost Since Last Visit: 5 lb Weight Gained Since Last Visit: 0 Starting Weight: 326 lb Total Weight Loss (lbs): 3 lb (1.361 kg) Peak Weight: 336 lb   Body Composition  Body Fat %: 40.6 % Fat Mass (lbs): 131.2 lbs Muscle Mass (lbs): 182.6 lbs Total Body Water (lbs): 136.2 lbs Visceral Fat Rating : 28   Other Clinical Data Fasting: yes Labs: yes Today's Visit #: 98 Starting Date: 09/08/17 Comments: Cat 3    Chief Complaint: OBESITY   History of Present Illness Bryan [name] is a 63 year old male who presents for obesity treatment and progress assessment.  He has been following a category three eating plan about sixty percent of the time and is walking for thirty minutes two to three times per week. He has lost five pounds in the last month. He has a history of emotional eating behaviors and is being treated with topiramate , for which he requests a refill.  He is managing hypertension with diet, exercise, weight loss, and medications including Maxzide and Cozaar , for which he requests refills. Additionally, he is on metoprolol  but does not need a refill. His blood pressure has been high lately but is starting to come down.  He has a history of vitamin D  deficiency and is being treated with prescription vitamin D , 50,000 IU weekly.  He has a history of prediabetes and is working on decreasing simple carbohydrates in his diet, increasing exercise, and focusing on weight loss. He is on metformin  500 mg three times a day and requests a refill.  He reports a recent episode of shortness of breath without chest pain, left arm symptoms, or nausea. He describes a chronic cough and congestion, and has been prescribed an inhaler, which provides some relief.  He mentions difficulty taking deep breaths, which he has noticed more recently. He has not seen a pulmonologist specifically for this issue but plans to consult with a pulmonary group regarding his CPAP use.  He has obstructive sleep apnea and uses a CPAP machine regularly, including a travel CPAP for trips. He recently experienced not being able to use his CPAP on a plane due to lack of a battery.  His exercise routine has been hampered by his symptoms, although he continues to walk on flat surfaces. He experiences difficulty with inclines and stairs, which he attributes to being out of shape and aggressive exercise. He is focusing more on diet due to these limitations.  Socially, he is preparing for his daughter to go to college and his son to move to [employer] for a job. He recently celebrated his birthday and plans to attend a Christmas show in [place]. He is mindful of his diet, especially regarding protein intake and dessert consumption.      PHYSICAL EXAM:  Blood pressure 126/77, pulse 66, temperature 97.7 F (36.5 C), height 5' 11 (1.803 m), weight (!) 323 lb (146.5 kg), SpO2 97%. Body mass index is 45.05 kg/m.  DIAGNOSTIC DATA REVIEWED:  BMET    Component Value Date/Time   NA 137 05/29/2024 1545   NA 141 08/19/2023 1443   K 3.7 05/29/2024 1545   CL 99 05/29/2024 1545   CO2 23 05/29/2024 1545   GLUCOSE 138 (H) 05/29/2024 1545   BUN 16 05/29/2024 1545  BUN 13 08/19/2023 1443   CREATININE 0.99 05/29/2024 1545   CALCIUM  9.7 05/29/2024 1545   GFRNONAA >60 05/29/2024 1545   GFRAA 96 05/29/2020 1417   Lab Results  Component Value Date   HGBA1C 6.4 (H) 08/19/2023   HGBA1C 5.9 08/15/2013   Lab Results  Component Value Date   INSULIN  15.1 08/19/2023   INSULIN  21.9 09/08/2017   Lab Results  Component Value Date   TSH 1.430 08/19/2023   CBC    Component Value Date/Time   WBC 8.5 05/29/2024 1545   RBC 5.17 05/29/2024 1545   HGB 15.0 05/29/2024 1545   HGB 15.1  08/19/2023 1443   HCT 45.2 05/29/2024 1545   HCT 46.1 08/19/2023 1443   PLT 261 05/29/2024 1545   PLT 239 08/19/2023 1443   MCV 87.4 05/29/2024 1545   MCV 89 08/19/2023 1443   MCH 29.0 05/29/2024 1545   MCHC 33.2 05/29/2024 1545   RDW 13.4 05/29/2024 1545   RDW 13.1 08/19/2023 1443   Iron Studies No results found for: IRON, TIBC, FERRITIN, IRONPCTSAT Lipid Panel     Component Value Date/Time   CHOL 141 11/03/2023 0810   CHOL 182 08/19/2023 1443   TRIG 260.0 (H) 11/03/2023 0810   HDL 39.00 (L) 11/03/2023 0810   HDL 47 08/19/2023 1443   CHOLHDL 4 11/03/2023 0810   VLDL 52.0 (H) 11/03/2023 0810   LDLCALC 50 11/03/2023 0810   LDLCALC 89 08/19/2023 1443   LDLDIRECT 136.0 11/16/2016 0902   Hepatic Function Panel     Component Value Date/Time   PROT 7.7 05/29/2024 1615   PROT 7.5 08/19/2023 1443   ALBUMIN 4.6 05/29/2024 1615   ALBUMIN 4.6 08/19/2023 1443   AST 25 05/29/2024 1615   ALT 43 05/29/2024 1615   ALKPHOS 64 05/29/2024 1615   BILITOT 0.3 05/29/2024 1615   BILITOT 0.6 08/19/2023 1443   BILIDIR 0.1 05/29/2024 1615   IBILI 0.2 (L) 05/29/2024 1615      Component Value Date/Time   TSH 1.430 08/19/2023 1443   Nutritional Lab Results  Component Value Date   VD25OH 61.5 08/19/2023   VD25OH 49.8 08/13/2022   VD25OH 55.45 09/15/2021     Assessment and Plan Assessment & Plan Obesity and Emotional Eating Behaviors He follows the category three eating plan 60% of the time and walks for 30 minutes two to three times per week, resulting in a five-pound weight loss over the last month. Emotional eating behaviors are managed with topiramate . He is exploring GLP-1 receptor agonists, specifically Zepbound , potentially covered for sleep apnea treatment. Weight loss is expected to improve breathing and overall health. - Continue category three eating plan - Continue walking 30 minutes two to three times per week - Consider using two-pound ankle weights for walking -  Refill topiramate  - Obtain sleep study results to explore Zepbound  coverage  Obstructive Sleep Apnea He uses a CPAP machine regularly, with both home and travel units. He is considering Zepbound  for weight loss, potentially covered for sleep apnea treatment, and advised to obtain his last sleep study results. - Continue using CPAP regularly - Obtain sleep study results to explore Zepbound  coverage - Continue category three eating plan - Continue walking 30 minutes two to three times per week  Hypertension Hypertension is managed with diet, exercise, weight loss, Maxzide, and Cozaar . Blood pressure has been elevated but is improving. He requests refills for Maxzide and Cozaar . - Refill Maxzide - Refill Cozaar  - Continue category three eating plan - Continue  walking 30 minutes two to three times per week  Prediabetes He is reducing simple carbohydrates, increasing exercise, and focusing on weight loss. He takes metformin  500 mg three times daily and requests a refill. Labs are due for monitoring. - Refill metformin  - Order labs for monitoring prediabetes - Continue category three eating plan - Continue walking 30 minutes two to three times per week  Hyperlipidemia Labs are due to monitor cholesterol levels. He is advised to continue diet, exercise, and weight loss for management. - Order labs to monitor cholesterol levels  Vitamin D  Deficiency Vitamin D  deficiency is treated with prescription vitamin D , 50,000 IU weekly. Labs are due to monitor levels. - Continue prescription vitamin D  50,000 IU weekly - Order labs to monitor vitamin D  levels  Follow-up He is scheduled to see a cardiologist next week and plans to follow up with a pulmonary group for CPAP management. Follow-up is advised in four weeks for further evaluation. - Follow up with cardiologist next week - Schedule appointment with pulmonary group - Follow up in four weeks for further evaluation    He was informed of  the importance of frequent follow up visits to maximize his success with intensive lifestyle modifications for his multiple health conditions.    Louann Penton, MD

## 2024-06-16 LAB — LIPID PANEL
Chol/HDL Ratio: 3.4 ratio (ref 0.0–5.0)
Cholesterol, Total: 161 mg/dL (ref 100–199)
HDL: 48 mg/dL (ref >39)
LDL Chol Calc (NIH): 65 mg/dL (ref 0–99)
Triglycerides: 306 mg/dL — ABNORMAL HIGH (ref 0–149)
VLDL Cholesterol Cal: 48 mg/dL — ABNORMAL HIGH (ref 5–40)

## 2024-06-16 LAB — HEMOGLOBIN A1C
Est. average glucose Bld gHb Est-mCnc: 148 mg/dL
Hgb A1c MFr Bld: 6.8 % — ABNORMAL HIGH (ref 4.8–5.6)

## 2024-06-16 LAB — COMPREHENSIVE METABOLIC PANEL WITH GFR
ALT: 48 IU/L — ABNORMAL HIGH (ref 0–44)
AST: 26 IU/L (ref 0–40)
Albumin: 4.5 g/dL (ref 3.9–4.9)
Alkaline Phosphatase: 61 IU/L (ref 44–121)
BUN/Creatinine Ratio: 17 (ref 10–24)
BUN: 17 mg/dL (ref 8–27)
Bilirubin Total: 0.5 mg/dL (ref 0.0–1.2)
CO2: 22 mmol/L (ref 20–29)
Calcium: 9.5 mg/dL (ref 8.6–10.2)
Chloride: 101 mmol/L (ref 96–106)
Creatinine, Ser: 1.01 mg/dL (ref 0.76–1.27)
Globulin, Total: 2.6 g/dL (ref 1.5–4.5)
Glucose: 108 mg/dL — ABNORMAL HIGH (ref 70–99)
Potassium: 4.4 mmol/L (ref 3.5–5.2)
Sodium: 140 mmol/L (ref 134–144)
Total Protein: 7.1 g/dL (ref 6.0–8.5)
eGFR: 85 mL/min/1.73 (ref >59)

## 2024-06-16 LAB — INSULIN, RANDOM: INSULIN: 18.3 u[IU]/mL (ref 2.6–24.9)

## 2024-06-16 LAB — VITAMIN D 25 HYDROXY (VIT D DEFICIENCY, FRACTURES): Vit D, 25-Hydroxy: 46.6 ng/mL (ref 30.0–100.0)

## 2024-06-20 NOTE — Telephone Encounter (Signed)
 See my chart message

## 2024-06-21 NOTE — Progress Notes (Unsigned)
 Cardiology Office Note Date:  06/21/2024  Patient ID:  Bryan [name], Bryan [name] 1960-12-25, MRN [id number] PCP:  Amon Aloysius BRAVO, MD  Electrophysiologist: Dr. Cindie    Chief Complaint:  *** ER f/u  History of Present Illness: Bryan [name] is a 63 y.o. male with history of  OSA (w/CPAP), HTN, obesity AFlutter, RBBB  Referred to Dr. Cindie via AFib clinic, 10/14/21, he was working on weight loss, following at [facility], with success. Planned for ablation  CTI ablation 01/19/22  Saw Dr. Cindie 02/17/22, no recurrent symptoms of his AFlutter, planned to stop his OAC after 3 mo (May 13) if no recurrent arrhythmia.  Discussed recommendation to establish rhythm monitoring strategy via watch, loop.   C/w [facility], walking for exercise.SABRA TOTAL lost 10lbs by their last note  I saw him 11/04/22 He is doing well No symptoms or watch alerts to AFlutter, elevated HRs No CP,SOB No near syncope or syncope. Had an independent carotid US  for screening, reportedly was OK PMD manages his lipitor/statin No changes were made Encouraged ongoing weight loss efforts/exercise  I saw him Nov 2024 He continues to do quite well Walks on the treadmill most days, either that or outside walks. No CP, palpitations or cardiac awareness Good exertional capacity, no SOB Does not think he has had an recurrent AFlutter since his ablation and does think he would recognize if he did, also has a watch that has the capacity to detect arrhythmia/alert to elevated rates, etc. No dizzy spells, near syncope or syncope Working with health and wellness for weight loss/management PMD manages lipids/labs, see him soon Planned for an annual visit  ER 05/29/24 w/SOB Described as progressive over a month or so, acutely worse at a trip to the beach associated with edema Day of visit had some SOB at work that was somewhat overwhelming, no CP Labs, w/u unremarkable and discharged from the  ER LABS K+ 3.7 BUN/Creat 16/0.99 BNP <36 HS Trop T <15 (x2) WBC 8.5 H/H 15/45 Plts 261 CXR w/NAD EKG SR 87bpm, RBBB, LAD (unchanged)  Saw his PMD 06/06/24 , planned for Ddimerm Rx symbicort  trial given using his wife's inhaler seemed to help him Ddimer was neg <0.19  TODAY  *** better? *** heat likely *** echo? Monitor? AFib? *** any watch alerts?   AF/AAD Hx AFlutter noted 05/25/21 CTI ablation 01/19/22  Past Medical History:  Diagnosis Date   Allergy    Dental crowns present    Difficult intubation    Hyperlipidemia    Hypertension    Lower back pain    Nephrolithiasis 2017   Prediabetes 09/04/2014   Seasonal allergies    Sebaceous cyst 04/2013   upper back   Sleep apnea    uses CPAP nightly   Urolithiasis 10/2016    Past Surgical History:  Procedure Laterality Date   A-FLUTTER ABLATION N/A 01/19/2022   Procedure: A-FLUTTER ABLATION;  Surgeon: Cindie Ole DASEN, MD;  Location: [facility] INVASIVE CV LAB;  Service: Cardiovascular;  Laterality: N/A;   ADENOIDECTOMY     as a child   bone marrow donor     under anesthesia   CHOLECYSTECTOMY  01/12/2012   Procedure: LAPAROSCOPIC CHOLECYSTECTOMY WITH INTRAOPERATIVE CHOLANGIOGRAM;  Surgeon: Donnice POUR. Belinda, MD;  Location: [facility] ORS;  Service: General;  Laterality: N/A;   COLONOSCOPY     COLONOSCOPY WITH PROPOFOL  N/A 12/15/2022   Procedure: COLONOSCOPY WITH PROPOFOL ;  Surgeon: Abran Norleen SAILOR, MD;  Location: [facility]OS[facility];  Service: Gastroenterology;  Laterality: N/A;   CYST EXCISION     cyst removal from back   EAR CYST EXCISION N/A 05/04/2013   Procedure: Excision subcutaneous mass posterior neck;  Surgeon: Donnice POUR. Belinda, MD;  Location: [facility];  Service: General;  Laterality: N/A;   POLYPECTOMY     POLYPECTOMY  12/15/2022   Procedure: POLYPECTOMY;  Surgeon: Abran Norleen SAILOR, MD;  Location: [facility];  Service: Gastroenterology;;   wisdom teeth      Current Outpatient Medications  Medication Sig  Dispense Refill   aspirin EC 81 MG tablet Take 81 mg by mouth daily. Swallow whole.     atorvastatin  (LIPITOR) 40 MG tablet Take 1 tablet (40 mg total) by mouth at bedtime. 90 tablet 3   azelastine  (ASTELIN ) 0.1 % nasal spray Place 2 sprays into both nostrils at bedtime as needed for rhinitis. Use in each nostril as directed 30 mL 5   b complex vitamins capsule Take 1 capsule by mouth daily.     benzonatate  (TESSALON ) 200 MG capsule Take 1 capsule (200 mg total) by mouth 2 (two) times daily as needed for cough. 20 capsule 0   budesonide -formoterol  (SYMBICORT ) 160-4.5 MCG/ACT inhaler Inhale 2 puffs into the lungs 2 (two) times daily. 1 each 3   buPROPion  (WELLBUTRIN  SR) 200 MG 12 hr tablet 1 po BID 60 tablet 0   CO ENZYME Q-10 PO Take 1 capsule by mouth in the morning.     diltiazem  (CARDIZEM ) 30 MG tablet Take 1 - 2 tablets by mouth every 6 hours as needed for heart rate greater than 130 30 tablet 0   famotidine-calcium  carbonate-magnesium hydroxide (PEPCID COMPLETE) 10-800-165 MG chewable tablet Chew 1 tablet by mouth daily as needed (acid reflux).     fish oil-omega-3 fatty acids 1000 MG capsule Take 1 g by mouth in the morning.     fluticasone  (FLONASE ) 50 MCG/ACT nasal spray Place 2 sprays into both nostrils daily. 16 g 0   ibuprofen (ADVIL) 200 MG tablet Take 400 mg by mouth every 6 (six) hours as needed for moderate pain.     losartan  (COZAAR ) 100 MG tablet Take 1 tablet (100 mg total) by mouth daily. 30 tablet 0   MELATONIN PO Take 1 tablet by mouth at bedtime. With valerian root     metFORMIN  (GLUCOPHAGE ) 500 MG tablet TAKE 1 TABLET(500 MG) BY MOUTH THREE TIMES DAILY 90 tablet 0   metoprolol  succinate (TOPROL -XL) 25 MG 24 hr tablet TAKE 1 TABLET(25 MG) BY MOUTH AT BEDTIME 90 tablet 3   Multiple Vitamins-Minerals (CENTRUM PO) Take 1 tablet by mouth in the morning.     omeprazole (PRILOSEC OTC) 20 MG tablet Take 20 mg by mouth daily as needed (acid reflux).     topiramate  (TOPAMAX ) 50 MG  tablet One half tab by mouth daily for a week, then twice daily 30 tablet 0   triamterene -hydrochlorothiazide  (MAXZIDE-25) 37.5-25 MG tablet Take 1 tablet by mouth daily. 30 tablet 0   Vitamin D , Ergocalciferol , (DRISDOL ) 1.25 MG (50000 UNIT) CAPS capsule Take 1 capsule (50,000 Units total) by mouth every Sunday. 4 capsule 0   No current facility-administered medications for this visit.    Allergies:   Lisinopril    Social History:  The patient  reports that he has never smoked. He has never been exposed to tobacco smoke. He has never used smokeless tobacco. He reports current alcohol use of about 3.0 - 4.0 standard drinks of alcohol per week. He  reports that he does not use drugs.   Family History:  The patient's family history includes Cancer in his brother and paternal grandmother; Cancer (age of onset: 18) in his father; Colon cancer (age of onset: 59) in his maternal aunt; Diabetes in his brother; Hyperlipidemia in his mother; Hypertension in his mother; Kidney disease in his father; Obesity in his mother; Ovarian cancer in his mother; Sleep apnea in his father.  ROS:  Please see the history of present illness.    All other systems are reviewed and otherwise negative.   PHYSICAL EXAM:  VS:  There were no vitals taken for this visit. BMI: There is no height or weight on file to calculate BMI. Well nourished, well developed, in no acute distress HEENT: normocephalic, atraumatic Neck: no JVD, carotid bruits or masses Cardiac:  *** RRR; no significant murmurs, no rubs, or gallops Lungs:  *** CTA b/l, no wheezing, rhonchi or rales Abd: soft, nontender MS: no deformity or  atrophy Ext: *** no edema Skin: warm and dry, no rash Neuro:  No gross deficits appreciated Psych: euthymic mood, full affect   EKG:  done today and reviewed by myself ***   01/19/22: EPS/ablation CONCLUSIONS:   1.  Symptomatic typical atrial flutter  2. Successful radiofrequency ablation of atrial flutter along  the cavotricuspid isthmus with complete bidirectional isthmus block achieved.   3. No inducible arrhythmias following ablation.   4. No early apparent complications.   01/12/22: Cardiac CT IMPRESSION: 1. There is normal pulmonary vein drainage into the left atrium with ostial measurements above.   2. There is no thrombus in the left atrial appendage but significant noise artifact limits sensitivity of this study for identifying thrombus. Consider TEE for further evaulation.   3. The esophagus runs in the left atrial midline and is not in proximity to any of the pulmonary vein ostia.   4. No obvious PFO/ASD but significant noise artifact limits sensitivity of study.   5. Normal coronary origin. Right dominance.   6. CAC score of 0 which is 0 percentile for age-, race-, and sex-matched controls.  Recent Labs: 08/19/2023: Magnesium 2.2; TSH 1.430 05/29/2024: Hemoglobin 15.0; Platelets 261; Pro Brain Natriuretic Peptide <36.0 06/15/2024: ALT 48; BUN 17; Creatinine, Ser 1.01; Potassium 4.4; Sodium 140  11/03/2023: VLDL 52.0 06/15/2024: Chol/HDL Ratio 3.4; Cholesterol, Total 161; HDL 48; LDL Chol Calc (NIH) 65; Triglycerides 306   Estimated Creatinine Clearance: 112.8 mL/min (by C-G formula based on SCr of 1.01 mg/dL).   Wt Readings from Last 3 Encounters:  06/15/24 (!) 323 lb (146.5 kg)  06/06/24 (!) 328 lb 8 oz (149 kg)  05/29/24 (!) 320 lb (145.2 kg)     Other studies reviewed: Additional studies/records reviewed today include: summarized above  ASSESSMENT AND PLAN:  AFlutter (typical) S/p ablation Off a/c *** No symptoms or alerts of arrhythmia Has an Apple watch for rhythm/HR surveillance   HTN *** Looks good  Obesity Continues with the weight/wellness center Reports excellent compliance with his CPAP  4. DOE/SOB ***   Disposition: ***  sooner if needed  Current medicines are reviewed at length with the patient today.  The patient did not have any concerns  regarding medicines.  Bonney Charlies Arthur, PA-C 06/21/2024 8:04 AM     [facility] [address] [place] [address] [phone] (office)  [phone] (fax)

## 2024-06-22 ENCOUNTER — Ambulatory Visit: Attending: Physician Assistant | Admitting: Physician Assistant

## 2024-06-22 ENCOUNTER — Encounter: Payer: Self-pay | Admitting: Physician Assistant

## 2024-06-22 VITALS — BP 122/72 | HR 66 | Ht 71.0 in | Wt 326.3 lb

## 2024-06-22 DIAGNOSIS — R0600 Dyspnea, unspecified: Secondary | ICD-10-CM | POA: Diagnosis not present

## 2024-06-22 DIAGNOSIS — I483 Typical atrial flutter: Secondary | ICD-10-CM | POA: Diagnosis not present

## 2024-06-22 DIAGNOSIS — I10 Essential (primary) hypertension: Secondary | ICD-10-CM | POA: Diagnosis not present

## 2024-06-22 DIAGNOSIS — I4510 Unspecified right bundle-branch block: Secondary | ICD-10-CM

## 2024-06-22 NOTE — Patient Instructions (Signed)
 Medication Instructions:   Your physician recommends that you continue on your current medications as directed. Please refer to the Current Medication list given to you today.   *If you need a refill on your cardiac medications before your next appointment, please call your pharmacy*   Lab Work: NONE ORDERED  TODAY    If you have labs (blood work) drawn today and your tests are completely normal, you will receive your results only by: MyChart Message (if you have MyChart) OR A paper copy in the mail If you have any lab test that is abnormal or we need to change your treatment, we will call you to review the results.   Testing/Procedures: Your physician has requested that you have an echocardiogram. Echocardiography is a painless test that uses sound waves to create images of your heart. It provides your doctor with information about the size and shape of your heart and how well your heart's chambers and valves are working. This procedure takes approximately one hour. There are no restrictions for this procedure. Please do NOT wear cologne, perfume, aftershave, or lotions (deodorant is allowed). Please arrive 15 minutes prior to your appointment time.  Please note: We ask at that you not bring children with you during ultrasound (echo/ vascular) testing. Due to room size and safety concerns, children are not allowed in the ultrasound rooms during exams. Our front office staff cannot provide observation of children in our lobby area while testing is being conducted. An adult accompanying a patient to their appointment will only be allowed in the ultrasound room at the discretion of the ultrasound technician under special circumstances. We apologize for any inconvenience.    Follow-Up: At [facility], you and your health needs are our priority.  As part of our continuing mission to provide you with exceptional heart care, our providers are all part of one team.  This team includes  your primary Cardiologist (physician) and Advanced Practice Providers or APPs (Physician Assistants and Nurse Practitioners) who all work together to provide you with the care you need, when you need it.  Your next appointment:  3 month(s)  Provider:   Charlies Arthur, PA-C    We recommend signing up for the patient portal called MyChart.  Sign up information is provided on this After Visit Summary.  MyChart is used to connect with patients for Virtual Visits (Telemedicine).  Patients are able to view lab/test results, encounter notes, upcoming appointments, etc.  Non-urgent messages can be sent to your provider as well.   To learn more about what you can do with MyChart, go to [email or web address].   Other Instructions

## 2024-07-05 ENCOUNTER — Other Ambulatory Visit (INDEPENDENT_AMBULATORY_CARE_PROVIDER_SITE_OTHER): Payer: Self-pay | Admitting: Family Medicine

## 2024-07-05 DIAGNOSIS — R7303 Prediabetes: Secondary | ICD-10-CM

## 2024-07-05 DIAGNOSIS — F3289 Other specified depressive episodes: Secondary | ICD-10-CM

## 2024-07-05 DIAGNOSIS — I10 Essential (primary) hypertension: Secondary | ICD-10-CM

## 2024-07-13 ENCOUNTER — Ambulatory Visit (INDEPENDENT_AMBULATORY_CARE_PROVIDER_SITE_OTHER): Admitting: Family Medicine

## 2024-07-19 ENCOUNTER — Ambulatory Visit (INDEPENDENT_AMBULATORY_CARE_PROVIDER_SITE_OTHER): Payer: Self-pay | Admitting: Family Medicine

## 2024-07-28 ENCOUNTER — Ambulatory Visit: Payer: Self-pay | Admitting: Physician Assistant

## 2024-07-28 ENCOUNTER — Ambulatory Visit (HOSPITAL_COMMUNITY)
Admission: RE | Admit: 2024-07-28 | Discharge: 2024-07-28 | Disposition: A | Discharge status: Home / Self Care | Source: Ambulatory Visit | Attending: Physician Assistant | Admitting: Physician Assistant

## 2024-07-28 DIAGNOSIS — R0600 Dyspnea, unspecified: Secondary | ICD-10-CM | POA: Insufficient documentation

## 2024-07-28 LAB — ECHOCARDIOGRAM COMPLETE
Area-P 1/2: 3.89 cm2
S' Lateral: 3.3 cm

## 2024-07-28 MED ORDER — PERFLUTREN LIPID MICROSPHERE
2.0000 mL | INTRAVENOUS | Status: AC | PRN
  Administered 2024-07-28: 2 mL via INTRAVENOUS

## 2024-08-01 NOTE — Telephone Encounter (Signed)
 Missed appt with me on 07/13/24 to go over labs.  Notified pt via phone call her is now a diabetic and to reach out for f/up with pcp as well to schedule DM eye/ foot exams etc.   He will review labs with Dr Verdon in the very near future. This note sent to her as well.

## 2024-08-10 ENCOUNTER — Encounter (INDEPENDENT_AMBULATORY_CARE_PROVIDER_SITE_OTHER): Payer: Self-pay | Admitting: Family Medicine

## 2024-08-10 ENCOUNTER — Other Ambulatory Visit (HOSPITAL_COMMUNITY): Payer: Self-pay

## 2024-08-10 ENCOUNTER — Telehealth (INDEPENDENT_AMBULATORY_CARE_PROVIDER_SITE_OTHER): Payer: Self-pay

## 2024-08-10 ENCOUNTER — Ambulatory Visit (INDEPENDENT_AMBULATORY_CARE_PROVIDER_SITE_OTHER): Admitting: Family Medicine

## 2024-08-10 VITALS — BP 164/86 | HR 71 | Temp 98.2°F | Ht 71.0 in | Wt 336.0 lb

## 2024-08-10 DIAGNOSIS — F5089 Other specified eating disorder: Secondary | ICD-10-CM | POA: Diagnosis not present

## 2024-08-10 DIAGNOSIS — E559 Vitamin D deficiency, unspecified: Secondary | ICD-10-CM

## 2024-08-10 DIAGNOSIS — M545 Low back pain, unspecified: Secondary | ICD-10-CM

## 2024-08-10 DIAGNOSIS — E669 Obesity, unspecified: Secondary | ICD-10-CM

## 2024-08-10 DIAGNOSIS — E1169 Type 2 diabetes mellitus with other specified complication: Secondary | ICD-10-CM

## 2024-08-10 DIAGNOSIS — I10 Essential (primary) hypertension: Secondary | ICD-10-CM

## 2024-08-10 DIAGNOSIS — Z7985 Long-term (current) use of injectable non-insulin antidiabetic drugs: Secondary | ICD-10-CM

## 2024-08-10 DIAGNOSIS — F32A Depression, unspecified: Secondary | ICD-10-CM

## 2024-08-10 DIAGNOSIS — F3289 Other specified depressive episodes: Secondary | ICD-10-CM

## 2024-08-10 DIAGNOSIS — Z6841 Body Mass Index (BMI) 40.0 and over, adult: Secondary | ICD-10-CM

## 2024-08-10 DIAGNOSIS — E119 Type 2 diabetes mellitus without complications: Secondary | ICD-10-CM

## 2024-08-10 MED ORDER — TRIAMTERENE-HCTZ 37.5-25 MG PO TABS
1.0000 | ORAL_TABLET | Freq: Every day | ORAL | 0 refills | Status: DC
  Filled 2024-08-10: qty 30, 30d supply, fill #0

## 2024-08-10 MED ORDER — VITAMIN D (ERGOCALCIFEROL) 1.25 MG (50000 UNIT) PO CAPS
50000.0000 [IU] | ORAL_CAPSULE | ORAL | 0 refills | Status: DC
  Filled 2024-08-10: qty 4, 28d supply, fill #0

## 2024-08-10 MED ORDER — BUPROPION HCL ER (SR) 200 MG PO TB12
200.0000 mg | ORAL_TABLET | Freq: Two times a day (BID) | ORAL | 0 refills | Status: DC
  Filled 2024-08-10: qty 60, 30d supply, fill #0

## 2024-08-10 MED ORDER — TIRZEPATIDE 2.5 MG/0.5ML ~~LOC~~ SOAJ
2.5000 mg | SUBCUTANEOUS | 0 refills | Status: DC
  Filled 2024-08-10: qty 2, 28d supply, fill #0

## 2024-08-10 MED ORDER — TOPIRAMATE 50 MG PO TABS
25.0000 mg | ORAL_TABLET | Freq: Two times a day (BID) | ORAL | 0 refills | Status: DC
  Filled 2024-08-10: qty 30, 15d supply, fill #0

## 2024-08-10 MED ORDER — METFORMIN HCL 500 MG PO TABS
500.0000 mg | ORAL_TABLET | Freq: Three times a day (TID) | ORAL | 0 refills | Status: DC
  Filled 2024-08-10: qty 90, 30d supply, fill #0

## 2024-08-10 MED ORDER — LOSARTAN POTASSIUM 100 MG PO TABS
100.0000 mg | ORAL_TABLET | Freq: Every day | ORAL | 0 refills | Status: DC
  Filled 2024-08-10: qty 30, 30d supply, fill #0

## 2024-08-10 NOTE — Telephone Encounter (Signed)
 Plan Member Name: Bryan [name] Plan Member ID: [id number] Prescriber Name: LOUANN PENTON Prescriber Phone: [phone] Prescriber Fax: [phone] Plan-approved Criteria used for review: Antidiabetic GLP-1, GIP-GLP-1 Agonist PA with Logic Dear Bryan Lerch [name]: [facility] [employer]  received a request from your provider for coverage of Mounjaro  (tirzepatide ). As long as you remain covered by the [name] and there are no changes to your plan benefits, this request is approved for the following time period: 08/10/2024 - 08/11/2027

## 2024-08-10 NOTE — Progress Notes (Signed)
 Office: [phone]  /  Fax: [phone]  WEIGHT SUMMARY AND BIOMETRICS  Anthropometric Measurements Height: 5' 11 (1.803 m) Weight: (!) 336 lb (152.4 kg) BMI (Calculated): 46.88 Weight at Last Visit: 323lb Weight Lost Since Last Visit: 0lb Weight Gained Since Last Visit: 13lb Starting Weight: 326lb Total Weight Loss (lbs): 0 lb (0 kg) Peak Weight: 336lb   Body Composition  Body Fat %: 43.2 % Fat Mass (lbs): 145.4 lbs Muscle Mass (lbs): 182 lbs Total Body Water (lbs): 152.6 lbs Visceral Fat Rating : 31   Other Clinical Data Fasting: no Labs: no Today's Visit #: 99 Starting Date: 09/08/17    Chief Complaint: OBESITY   History of Present Illness Bryan [name] is a 63 year old male with obesity, hypertension, and prediabetes who presents for obesity treatment plan assessment and progress evaluation.  He has struggled to adhere to his category three eating plan, following it approximately fifty percent of the time. Over the last two months, he has gained thirteen pounds, with bioimpedance analysis indicating that half of the weight gain is water and half is fat. Difficulty walking due to back pain has impacted his ability to exercise.  Back pain has been worsening over the past couple of weeks, with significant exacerbation in the last few days. The pain is sharp, primarily located in the lower back, more on the left side, but can affect both sides. It is severe enough to cause him to freeze up, as experienced in the shower. No radiating pain down the legs, and he does not believe it is sciatic pain, which he has experienced before. Gabapentin 600 mg has been used, which helps with the pain and improves sleep.  He is being treated for vitamin D  deficiency with ergocalciferol  50,000 IU weekly and requests a refill. He is also on losartan  and triamterene  hydrochlorothiazide  for hypertension, with current blood pressure readings elevated at 171/89 and 164/86. He  requests a refill for these medications.  He is being treated for prediabetes with metformin , but recent lab results show his hemoglobin A1c has increased to 6.8, crossing the threshold for diabetes.  He is also being treated for depression with Wellbutrin  and topiramate , which he takes by cutting the pill in half and taking it twice daily.  He and his wife recently went to [facility] with friends, where he managed to walk 15,000 steps at [facility] despite some back pain. He uses grocery delivery services to avoid impulse buying.      PHYSICAL EXAM:  Blood pressure (!) 164/86, pulse 71, temperature 98.2 F (36.8 C), height 5' 11 (1.803 m), weight (!) 336 lb (152.4 kg), SpO2 96%. Body mass index is 46.86 kg/m.  DIAGNOSTIC DATA REVIEWED:  BMET    Component Value Date/Time   NA 140 06/15/2024 1112   K 4.4 06/15/2024 1112   CL 101 06/15/2024 1112   CO2 22 06/15/2024 1112   GLUCOSE 108 (H) 06/15/2024 1112   GLUCOSE 138 (H) 05/29/2024 1545   BUN 17 06/15/2024 1112   CREATININE 1.01 06/15/2024 1112   CALCIUM  9.5 06/15/2024 1112   GFRNONAA >60 05/29/2024 1545   GFRAA 96 05/29/2020 1417   Lab Results  Component Value Date   HGBA1C 6.8 (H) 06/15/2024   HGBA1C 5.9 08/15/2013   Lab Results  Component Value Date   INSULIN  18.3 06/15/2024   INSULIN  21.9 09/08/2017   Lab Results  Component Value Date   TSH 1.430 08/19/2023   CBC    Component Value  Date/Time   WBC 8.5 05/29/2024 1545   RBC 5.17 05/29/2024 1545   HGB 15.0 05/29/2024 1545   HGB 15.1 08/19/2023 1443   HCT 45.2 05/29/2024 1545   HCT 46.1 08/19/2023 1443   PLT 261 05/29/2024 1545   PLT 239 08/19/2023 1443   MCV 87.4 05/29/2024 1545   MCV 89 08/19/2023 1443   MCH 29.0 05/29/2024 1545   MCHC 33.2 05/29/2024 1545   RDW 13.4 05/29/2024 1545   RDW 13.1 08/19/2023 1443   Iron Studies No results found for: IRON, TIBC, FERRITIN, IRONPCTSAT Lipid Panel     Component Value Date/Time   CHOL 161  06/15/2024 1112   TRIG 306 (H) 06/15/2024 1112   HDL 48 06/15/2024 1112   CHOLHDL 3.4 06/15/2024 1112   CHOLHDL 4 11/03/2023 0810   VLDL 52.0 (H) 11/03/2023 0810   LDLCALC 65 06/15/2024 1112   LDLDIRECT 136.0 11/16/2016 0902   Hepatic Function Panel     Component Value Date/Time   PROT 7.1 06/15/2024 1112   ALBUMIN 4.5 06/15/2024 1112   AST 26 06/15/2024 1112   ALT 48 (H) 06/15/2024 1112   ALKPHOS 61 06/15/2024 1112   BILITOT 0.5 06/15/2024 1112   BILIDIR 0.1 05/29/2024 1615   IBILI 0.2 (L) 05/29/2024 1615      Component Value Date/Time   TSH 1.430 08/19/2023 1443   Nutritional Lab Results  Component Value Date   VD25OH 46.6 06/15/2024   VD25OH 61.5 08/19/2023   VD25OH 49.8 08/13/2022     Assessment and Plan Assessment & Plan Obesity with recent weight gain and dietary nonadherence Recent weight gain of 13 pounds over two months, with approximately half being water weight and half fat. Muscle mass maintained. Nonadherence to dietary plan, following it approximately 50% of the time. Limited physical activity due to acute low back pain. Discussed potential use of Mounjaro  for weight management, noting its benefits for both weight loss and blood sugar control with fewer side effects compared to Saxenda . - Discuss dietary adherence and strategies to improve compliance - Encourage increased protein intake with foods like [place] yogurt, eggs, chicken sausages, lean red meat, and fish  Type 2 diabetes mellitus, newly diagnosed Recent hemoglobin A1c of 6.8%, crossing the threshold for diabetes. Previously treated with metformin  for prediabetes. Potential for improved management with GLP-1 receptor agonist therapy. Mounjaro  preferred due to better outcomes for blood sugar and weight loss compared to Saxenda . - Initiate Mounjaro  for diabetes management, pending insurance approval - Monitor blood glucose levels and adjust treatment as necessary - Continue diet, exercise and weight  loss as discussed today as an important part of the treatment plan   Essential hypertension Blood pressure elevated at 171/89 and 164/86. Possible contribution from acute pain due to low back pain. Currently on losartan  and triamterene  hydrochlorothiazide . Pain management and weight loss may improve blood pressure control. - Refill losartan  and triamterene  hydrochlorothiazide  - Monitor blood pressure, especially after resolution of acute pain - Continue diet, exercise and weight loss as discussed today as an important part of the treatment plan   Acute low back pain, left greater than right Severe acute low back pain, worse on the left side, with sharp pain exacerbated by certain movements. Pain has been present intermittently for a couple of weeks, worsening over the last few days. Gabapentin provides some relief. Differential includes muscle spasm, compressed disc, slipped disc, or vertebral fracture. - OK to Continue gabapentin for pain management tonight, until he sees ortho tomorrow  Vitamin D   deficiency Currently treated with ergocalciferol  50,000 IU weekly. - Refill ergocalciferol  50,000 IU weekly  Depression with emotional eating behaviors Currently managed with Wellbutrin  and topiramate . Emotional eating behaviors noted. - Refill Wellbutrin  and topiramate  - Continue monitoring and support for emotional eating behaviors     Darlene was informed of the importance of frequent follow up visits to maximize his success with intensive lifestyle modifications for his obesity and obesity related health conditions as recommended by USPSTF and CMS guidelines   Louann Penton, MD

## 2024-08-10 NOTE — Telephone Encounter (Signed)
 Prior authorization has been submitted for Mounjaro  2.5 mg for Type 2 DM.  Awaiting determination.   [employer] has not yet replied to your PA request. Depending on the information you've provided, additional questions may be returned by the plan. You may close this dialog, return to your dashboard, and perform other tasks.  To check for an update later, open this request again from your dashboard.  If [employer] has not replied to your request within 24 hours please contact [employer] at [phone].

## 2024-08-10 NOTE — Telephone Encounter (Signed)
 Patient has been notified of the approval of Mounjaro  via my chart.

## 2024-08-10 NOTE — Addendum Note (Signed)
 Addended by: LAFE BAKER CROME on: 08/10/2024 04:10 PM   Modules accepted: Level of Service

## 2024-08-11 ENCOUNTER — Other Ambulatory Visit (HOSPITAL_COMMUNITY): Payer: Self-pay

## 2024-08-11 MED ORDER — MELOXICAM 15 MG PO TABS
15.0000 mg | ORAL_TABLET | Freq: Every day | ORAL | 1 refills | Status: AC
  Filled 2024-08-11: qty 30, 30d supply, fill #0
  Filled 2024-09-29: qty 30, 30d supply, fill #1

## 2024-09-04 ENCOUNTER — Encounter (INDEPENDENT_AMBULATORY_CARE_PROVIDER_SITE_OTHER): Payer: Self-pay | Admitting: Family Medicine

## 2024-09-04 ENCOUNTER — Other Ambulatory Visit (INDEPENDENT_AMBULATORY_CARE_PROVIDER_SITE_OTHER): Payer: Self-pay | Admitting: Family Medicine

## 2024-09-04 DIAGNOSIS — F3289 Other specified depressive episodes: Secondary | ICD-10-CM

## 2024-09-04 DIAGNOSIS — E1169 Type 2 diabetes mellitus with other specified complication: Secondary | ICD-10-CM

## 2024-09-04 DIAGNOSIS — I10 Essential (primary) hypertension: Secondary | ICD-10-CM

## 2024-09-04 DIAGNOSIS — E559 Vitamin D deficiency, unspecified: Secondary | ICD-10-CM

## 2024-09-05 ENCOUNTER — Other Ambulatory Visit (HOSPITAL_COMMUNITY): Payer: Self-pay

## 2024-09-06 ENCOUNTER — Ambulatory Visit: Admitting: Internal Medicine

## 2024-09-06 ENCOUNTER — Encounter: Payer: Self-pay | Admitting: Internal Medicine

## 2024-09-06 DIAGNOSIS — I10 Essential (primary) hypertension: Secondary | ICD-10-CM

## 2024-09-06 DIAGNOSIS — E7849 Other hyperlipidemia: Secondary | ICD-10-CM

## 2024-09-06 LAB — HM DIABETES EYE EXAM

## 2024-09-06 MED ORDER — BUDESONIDE-FORMOTEROL FUMARATE 160-4.5 MCG/ACT IN AERO: 2.0000 | INHALATION_SPRAY | Freq: Two times a day (BID) | RESPIRATORY_TRACT | Status: AC | PRN

## 2024-09-06 NOTE — Patient Instructions (Addendum)
Per our records you are due for your diabetic eye exam. Please contact your eye doctor to schedule an appointment. Please have them send copies of your office visit notes to us. Our fax number is [phone]. If you need a referral to an eye doctor please let us know.  

## 2024-09-06 NOTE — Progress Notes (Unsigned)
 Subjective:    Patient ID: Bryan [name], male    DOB: 1961-08-23, 63 y.o.   MRN: [id number]  DOS:  09/06/2024 Type of visit - description:  Discussed the use of AI scribe software for clinical note transcription with the patient, who gave verbal consent to proceed.  History of Present Illness Bryan [name] is a 63 year old male who presents for follow-up of his respiratory issues and back injury.  Respiratory symptoms - No current respiratory symptoms - Previous respiratory issues have resolved  Lumbar back pain and injury - Sustained a back injury between visits - Initial X-rays showed no significant injury or disc involvement - Undergoing physical therapy with noted improvement - Nearing the end of physical therapy regimen  Hypertension - Blood pressure previously elevated during back injury - Blood pressure has improved  Diabetes mellitus - Recently diagnosed with diabetes - Hemoglobin A1c of 6.8 - Started Mounjaro  four weeks ago  Weight management - Takes Mounjaro  and Topamax  for weight loss - Some weight loss since starting Mounjaro   Hyperlipidemia - Takes medication for cholesterol management   Wt Readings from Last 3 Encounters:  09/06/24 (!) 327 lb 2 oz (148.4 kg)  08/10/24 (!) 336 lb (152.4 kg)  06/22/24 (!) 326 lb 4.8 oz (148 kg)      Review of Systems See above   Past Medical History:  Diagnosis Date   Allergy    Dental crowns present    Difficult intubation    Hyperlipidemia    Hypertension    Lower back pain    Nephrolithiasis 2017   Prediabetes 09/04/2014   Seasonal allergies    Sebaceous cyst 04/2013   upper back   Sleep apnea    uses CPAP nightly   Urolithiasis 10/2016    Past Surgical History:  Procedure Laterality Date   A-FLUTTER ABLATION N/A 01/19/2022   Procedure: A-FLUTTER ABLATION;  Surgeon: Cindie Ole DASEN, MD;  Location: [facility];  Service: Cardiovascular;  Laterality: N/A;   ADENOIDECTOMY     as  a child   bone marrow donor     under anesthesia   CHOLECYSTECTOMY  01/12/2012   Procedure: LAPAROSCOPIC CHOLECYSTECTOMY WITH INTRAOPERATIVE CHOLANGIOGRAM;  Surgeon: Donnice POUR. Belinda, MD;  Location: [facility];  Service: General;  Laterality: N/A;   COLONOSCOPY     COLONOSCOPY WITH PROPOFOL  N/A 12/15/2022   Procedure: COLONOSCOPY WITH PROPOFOL ;  Surgeon: Abran Norleen SAILOR, MD;  Location: [facility];  Service: Gastroenterology;  Laterality: N/A;   CYST EXCISION     cyst removal from back   EAR CYST EXCISION N/A 05/04/2013   Procedure: Excision subcutaneous mass posterior neck;  Surgeon: Donnice POUR. Tsuei, MD;  Location: [facility];  Service: General;  Laterality: N/A;   POLYPECTOMY     POLYPECTOMY  12/15/2022   Procedure: POLYPECTOMY;  Surgeon: Abran Norleen SAILOR, MD;  Location: [facility];  Service: Gastroenterology;;   wisdom teeth      Current Outpatient Medications  Medication Instructions   aspirin EC 81 mg, Daily   atorvastatin  (LIPITOR) 40 mg, Oral, Daily at bedtime   azelastine  (ASTELIN ) 0.1 % nasal spray 2 sprays, Each Nare, At bedtime PRN, Use in each nostril as directed    b complex vitamins capsule 1 capsule, Daily   benzonatate  (TESSALON ) 200 mg, Oral, 2 times daily PRN   budesonide -formoterol  (SYMBICORT ) 160-4.5 MCG/ACT inhaler 2 puffs, Inhalation, 2 times daily   buPROPion  (WELLBUTRIN  SR) 200 mg, Oral, 2 times daily  CO ENZYME Q-10 PO 1 capsule, Every morning   diltiazem  (CARDIZEM ) 30 MG tablet Take 1 - 2 tablets by mouth every 6 hours as needed for heart rate greater than 130   famotidine-calcium  carbonate-magnesium hydroxide (PEPCID COMPLETE) 10-800-165 MG chewable tablet 1 tablet, Daily PRN   fish oil-omega-3 fatty acids 1 g, Every morning   fluticasone  (FLONASE ) 50 MCG/ACT nasal spray 2 sprays, Each Nare, Daily   ibuprofen (ADVIL) 400 mg, Every 6 hours PRN   losartan  (COZAAR ) 100 mg, Oral, Daily   MELATONIN PO 1 tablet, Daily at bedtime   meloxicam  (MOBIC ) 15 MG  tablet Take 1 tablet by mouth once a day as needed for pain   metFORMIN  (GLUCOPHAGE ) 500 mg, Oral, 3 times daily   metoprolol  succinate (TOPROL -XL) 25 MG 24 hr tablet TAKE 1 TABLET(25 MG) BY MOUTH AT BEDTIME   Mounjaro  2.5 mg, Subcutaneous, Weekly   Multiple Vitamins-Minerals (CENTRUM PO) 1 tablet, Every morning   omeprazole (PRILOSEC OTC) 20 mg, Daily PRN   topiramate  (TOPAMAX ) 25-50 mg, Oral, 2 times daily   triamterene -hydrochlorothiazide  (MAXZIDE -25) 37.5-25 MG tablet 1 tablet, Oral, Daily   Vitamin D  (Ergocalciferol ) (DRISDOL ) 50,000 Units, Oral, Every Sun       Objective:   Physical Exam BP 136/86   Pulse 68   Temp 97.8 F (36.6 C) (Oral)   Resp 16   Ht 5' 11 (1.803 m)   Wt (!) 327 lb 2 oz (148.4 kg)   SpO2 97%   BMI 45.62 kg/m  General:   Well developed, NAD, BMI noted. HEENT:  Normocephalic . Face symmetric, atraumatic Lungs:  CTA B Normal respiratory effort, no intercostal retractions, no accessory muscle use. Heart: RRR,  no murmur.  Lower extremities: no pretibial edema bilaterally  Skin: Not pale. Not jaundice Neurologic:  alert & oriented X3.  Speech normal, gait appropriate for age and unassisted Psych--  Cognition and judgment appear intact.  Cooperative with normal attention span and concentration.  Behavior appropriate. No anxious or depressed appearing.      Assessment   Assessment Prediabetes HTN Hyperlipidemia CV: Atrial flutter, ablation 01-2022 --> NSR.  Eliquis  discontinue June 2023. Insomnia: on OTCs Morbid obesity, BMI 46 OSA, CPAP Seasonal allergies Kidney stone (first) 10-2016 Vit D def dx 09/2017 Retina detachment DX 06-2019  PLAN: Cardiology visit 06/22/2024 A-flutter, status post ablation, not anticoagulated. DOE: See LOV, D-dimer negative, echo ordered per cardiology, look okay.  Assessment and Plan Assessment & Plan Type 2 diabetes mellitus Progressed from prediabetes with a recent HbA1c of 6.8%. Initiated Mounjaro  four  weeks ago for glycemic control and weight loss.  He also takes metformin .    Obesity Ongoing management with Mounjaro  and wellness clinic support. Recent weight loss achieved. - Continue collaboration with the wellness clinic for weight loss. - Encourage increased physical activity as weight decreases, with caution regarding knee health.  Hypertension Currently controlled.  No change    Hyperlipidemia LDL cholesterol level well-controlled at 65 mg/dL. - Continue current lipid-lowering therapy.  Back injury Recent muscular back injury with no disc involvement. Improved with physical therapy. Vaccines: Will proceed when he comes back later on this month for CPX RTC as scheduled for later on CPX

## 2024-09-07 NOTE — Assessment & Plan Note (Signed)
 Type 2 diabetes mellitus Progressed from prediabetes with a recent HbA1c of 6.8%. Re-initiated Mounjaro  (Dr Verdon) 4 weeks ago for glycemic control and weight loss.  He also takes metformin . DOE: See LOV, D-dimer negative, echo ordered per cardiology, look okay. Obesity Ongoing management with Mounjaro  and wellness clinic support. Recent weight loss achieved.  Encourage increased physical activity as weight decreases, with caution regarding knee health. Hypertension Currently controlled.  No change Hyperlipidemia LDL cholesterol level well-controlled at 65 mg/dL.  Continue atorvastatin  Back injury Recent muscular back injury with no disc involvement. Improved with physical therapy. Vaccines: Will proceed when he comes back later on this month for CPX RTC as scheduled for later on CPX

## 2024-09-08 ENCOUNTER — Other Ambulatory Visit (HOSPITAL_COMMUNITY): Payer: Self-pay

## 2024-09-08 ENCOUNTER — Encounter (HOSPITAL_COMMUNITY): Payer: Self-pay

## 2024-09-13 ENCOUNTER — Ambulatory Visit (INDEPENDENT_AMBULATORY_CARE_PROVIDER_SITE_OTHER): Admitting: Family Medicine

## 2024-09-13 ENCOUNTER — Encounter (INDEPENDENT_AMBULATORY_CARE_PROVIDER_SITE_OTHER): Payer: Self-pay | Admitting: Family Medicine

## 2024-09-13 ENCOUNTER — Other Ambulatory Visit: Payer: Self-pay

## 2024-09-13 ENCOUNTER — Other Ambulatory Visit (HOSPITAL_COMMUNITY): Payer: Self-pay

## 2024-09-13 DIAGNOSIS — Z7984 Long term (current) use of oral hypoglycemic drugs: Secondary | ICD-10-CM

## 2024-09-13 DIAGNOSIS — I152 Hypertension secondary to endocrine disorders: Secondary | ICD-10-CM

## 2024-09-13 DIAGNOSIS — Z6841 Body Mass Index (BMI) 40.0 and over, adult: Secondary | ICD-10-CM

## 2024-09-13 DIAGNOSIS — G4733 Obstructive sleep apnea (adult) (pediatric): Secondary | ICD-10-CM

## 2024-09-13 DIAGNOSIS — E1159 Type 2 diabetes mellitus with other circulatory complications: Secondary | ICD-10-CM | POA: Diagnosis not present

## 2024-09-13 DIAGNOSIS — F5089 Other specified eating disorder: Secondary | ICD-10-CM

## 2024-09-13 DIAGNOSIS — Z7985 Long-term (current) use of injectable non-insulin antidiabetic drugs: Secondary | ICD-10-CM

## 2024-09-13 DIAGNOSIS — E559 Vitamin D deficiency, unspecified: Secondary | ICD-10-CM

## 2024-09-13 DIAGNOSIS — E1169 Type 2 diabetes mellitus with other specified complication: Secondary | ICD-10-CM | POA: Diagnosis not present

## 2024-09-13 DIAGNOSIS — F3289 Other specified depressive episodes: Secondary | ICD-10-CM

## 2024-09-13 MED ORDER — LOSARTAN POTASSIUM 100 MG PO TABS
100.0000 mg | ORAL_TABLET | Freq: Every day | ORAL | 0 refills | Status: DC
  Filled 2024-09-13: qty 90, 90d supply, fill #0

## 2024-09-13 MED ORDER — METFORMIN HCL 500 MG PO TABS
500.0000 mg | ORAL_TABLET | Freq: Three times a day (TID) | ORAL | 0 refills | Status: DC
  Filled 2024-09-13: qty 90, 30d supply, fill #0

## 2024-09-13 MED ORDER — VITAMIN D (ERGOCALCIFEROL) 1.25 MG (50000 UNIT) PO CAPS
50000.0000 [IU] | ORAL_CAPSULE | ORAL | 0 refills | Status: DC
  Filled 2024-09-13: qty 12, 84d supply, fill #0

## 2024-09-13 MED ORDER — BUPROPION HCL ER (SR) 200 MG PO TB12
200.0000 mg | ORAL_TABLET | Freq: Two times a day (BID) | ORAL | 0 refills | Status: DC
  Filled 2024-09-13: qty 180, 90d supply, fill #0

## 2024-09-13 MED ORDER — TRIAMTERENE-HCTZ 37.5-25 MG PO TABS
1.0000 | ORAL_TABLET | Freq: Every day | ORAL | 0 refills | Status: DC
  Filled 2024-09-13: qty 90, 90d supply, fill #0

## 2024-09-13 MED ORDER — TIRZEPATIDE 5 MG/0.5ML ~~LOC~~ SOAJ
5.0000 mg | SUBCUTANEOUS | 0 refills | Status: DC
  Filled 2024-09-13: qty 2, 28d supply, fill #0

## 2024-09-13 NOTE — Progress Notes (Signed)
 Bryan [name], D.O.  ABFM, ABOM Specializing in Clinical Bariatric Medicine  Office located at: [address] [place], [place]  [address]    FOR THE CHRONIC DISEASE OF OBESITY:   Morbid obesity (HCC)-START BMI 46.78; BMI 40.0-44.9, adult (HCC) -- Current BMI 45.07  Weight Summary and Body Composition Analysis  Weight Lost Since Last Visit: 13lb  Weight Gained Since Last Visit: 0lb    Vitals Temp: 98.4 F (36.9 C) BP: (!) 157/80 Pulse Rate: 84 SpO2: 97 %   Anthropometric Measurements Height: 5' 11 (1.803 m) Weight: (!) 323 lb (146.5 kg) BMI (Calculated): 45.07 Weight at Last Visit: 336lb Weight Lost Since Last Visit: 13lb Weight Gained Since Last Visit: 0lb Starting Weight: 326lb Total Weight Loss (lbs): 3 lb (1.361 kg) Peak Weight: 336lb   Body Composition  Body Fat %: 40.6 % Fat Mass (lbs): 131.2 lbs Muscle Mass (lbs): 182.4 lbs Total Body Water (lbs): 137 lbs Visceral Fat Rating : 28   Other Clinical Data Fasting: no Labs: no Today's Visit #: 100 Starting Date: 09/08/17    Chief complaint: Obesity  Interval History Bryan [name] is here for a follow-up office visit to discuss his progress with his obesity treatment plan. He is on the Category 3 Plan and states he is following his eating plan approximately 75 % of the time. He  is doing physical therapy for his back 60  minutes 3 days per week  He has experienced a weight loss of 13 lbs since last OV on 08/10/2024. At that visit, he was started on Mounjaro  2.5 mg wkly by [facility] for his new onset diabetes. He denies any side effects and feels it is helping control his cravings, hunger, food noise, and is preventing him from over-eating.   His dietary and life habits include:  - Tracking Calories/Macros: no, he is not on a journaling plan.  - Eating More Whole Foods: yes  - Adequate Protein Intake: no  - Adequate Water Intake: yes  - Skipping Meals: no  - Sleeping 7-9 Hours/  Night: yes    08/10/24 09/13/24   Body Fat % 43.2 % 40.6 %  Muscle Mass (lbs) 182 lbs 182.4 lbs  Fat Mass (lbs) 145.4 lbs 131.2 lbs  Total Body Water (lbs) 152.6 lbs 137 lbs  Visceral Fat Rating  31 28   Counseling done on how various foods will affect these numbers and how to maximize success  Total lbs lost to date: -3 lbs Total Fat Mass lost to date: +3.8 lbs Total weight loss percentage to date: -0.92 %   Nutritional and Behavioral Counseling:  We discussed the following today: increasing lean protein intake to established goals and eating multiple small meals a day to get in all their foods  Additional resources provided today: Handout on CAT 3 meal plan  Evidence-based interventions for health behavior change were utilized today including the discussion of self monitoring techniques, problem-solving barriers and SMART goal setting techniques.   Regarding patient's less desirable eating habits and patterns, we employed the technique of small changes.   SMART Goal(s) created today: n/a   Recommended Dietary Goals Bryan [name] is currently in the action stage of change. As such, his goal is to continue weight management plan.  He has agreed to continue the CAT 3 MP   Recommended Physical Activity Goals Bryan [name] has been advised to work up to 300-450 minutes of moderate intensity aerobic activity a week and strengthening exercises 2-3 times per week for  cardiovascular health, weight loss maintenance and preservation of muscle mass.   He was encouraged to Continue to gradually increase the amount and intensity of exercise routine   Medical Interventions and Pharmacotherapy Previous Bariatric surgery: n/a Pharmacotherapy:  See T2DM and Emotional Eating Behavior note.    OBESITY RELATED CONDITIONS ADDRESSED TODAY:    Medications Discontinued During This Encounter  Medication Reason   topiramate  (TOPAMAX ) 50 MG tablet Patient Preference   tirzepatide  (MOUNJARO ) 2.5  MG/0.5ML Pen Dose change   buPROPion  (WELLBUTRIN  SR) 200 MG 12 hr tablet Reorder   losartan  (COZAAR ) 100 MG tablet Reorder   metFORMIN  (GLUCOPHAGE ) 500 MG tablet Reorder   triamterene -hydrochlorothiazide  (MAXZIDE -25) 37.5-25 MG tablet Reorder   Vitamin D , Ergocalciferol , (DRISDOL ) 1.25 MG (50000 UNIT) CAPS capsule Reorder     Meds ordered this encounter  Medications   buPROPion  (WELLBUTRIN  SR) 200 MG 12 hr tablet    Sig: Take 1 tablet (200 mg total) by mouth 2 (two) times daily.    Dispense:  180 tablet    Refill:  0   losartan  (COZAAR ) 100 MG tablet    Sig: Take 1 tablet (100 mg total) by mouth daily.    Dispense:  90 tablet    Refill:  0   metFORMIN  (GLUCOPHAGE ) 500 MG tablet    Sig: Take 1 tablet (500 mg total) by mouth 3 (three) times daily.    Dispense:  90 tablet    Refill:  0   triamterene -hydrochlorothiazide  (MAXZIDE -25) 37.5-25 MG tablet    Sig: Take 1 tablet by mouth daily.    Dispense:  90 tablet    Refill:  0   Vitamin D , Ergocalciferol , (DRISDOL ) 1.25 MG (50000 UNIT) CAPS capsule    Sig: Take 1 capsule (50,000 Units total) by mouth every Sunday.    Dispense:  12 capsule    Refill:  0   tirzepatide  (MOUNJARO ) 5 MG/0.5ML Pen    Sig: Inject 5 mg into the skin once a week.    Dispense:  2 mL    Refill:  0     Highly recommend obtaining labs with Dr.Beasley next OV   Type 2 diabetes mellitus with other specified complication, without long-term current use of insulin  [facility]) Assessment & Plan: Lab Results  Component Value Date   HGBA1C 6.8 (H) 06/15/2024   HGBA1C 6.4 (H) 08/19/2023   HGBA1C 5.9 (H) 08/13/2022   INSULIN  18.3 06/15/2024   INSULIN  15.1 08/19/2023   INSULIN  9.7 08/13/2022    New onset diabetic with recent Hemoglobin A1c of 6.8. Denies symptoms of hypoglycemia or hyperglycemia. Pt doing well on Metformin  500 mg TID; continue at same dose (refill today). Also taking Mounjaro  2.5 mg wkly which was started by [facility] on 08/10/2024. Denies any side  effects from the Mounjaro  and feels it is helping control his cravings, hunger, food noise, and is preventing him from over-eating. Shared decision: INCREASE Mounjaro  to 5 mg wkly to improve A1c and aid with wt loss. Encouraged adequate hydration. Cont to decrease simple carbs/ sugars; increase fiber and proteins -> follow his meal plan.      Hypertension associated with type 2 diabetes mellitus (HCC) Assessment & Plan: Last 3 blood pressure readings in our office are as follows: BP Readings from Last 3 Encounters:  09/13/24 (!) 157/80  09/06/24 136/86  08/10/24 (!) 164/86   The 10-year ASCVD risk score (Arnett DK, et al., 2019) is: 23.8%  Lab Results  Component Value Date   CREATININE 1.01 06/15/2024   On  losartan  100 mg daily, Metoprolol  25 mg daily, and triamterene  hydrochlorothiazide  37.5 -25 mg daily. Occasionally takes Cardizem  30 mg daily. BP elevated today; pt attributes this to work stress. Pt asx. He recalls his systolic BP being around 135 at home. Goal BP: 130/80 or less on a regular basis; cont BP monitoring. Cont with low sodium diet. Recommend increasing exercise for stress management.     OSA on CPAP Assessment & Plan: On CPAP with  very good compliance and tolerance. No acute concerns.  Continue CPAP therapy. F/up with PCP/ pulmonology as directed.    Emotional Eating Behavior Assessment & Plan: On Wellbutrin  SR 200 mg twice daily and Topiramate  25 mg twice daily. States his cravings are well-controlled and that he has not been engaging in emotional eating lately . Shared decision making: Cont Wellbutrin  (refill today) and will trial going off Topiramate . Cont prudent nutritional plan and mindful eating.    Vitamin D  deficiency Assessment & Plan: Lab Results  Component Value Date   VD25OH 46.6 06/15/2024   VD25OH 61.5 08/19/2023   VD25OH 49.8 08/13/2022   Pt is doing well on Ergocalciferol  50,000 units wkly. Cont same regimen (refill today). Recheck as  deemed medically necessary.   Objective:   PHYSICAL EXAM: Blood pressure (!) 157/80, pulse 84, temperature 98.4 F (36.9 C), height 5' 11 (1.803 m), weight (!) 323 lb (146.5 kg), SpO2 97%. Body mass index is 45.05 kg/m.  General: he is overweight, cooperative and in no acute distress. PSYCH: Has normal mood, affect and thought process.   HEENT: EOMI, sclerae are anicteric. Lungs: Normal breathing effort, no conversational dyspnea. Extremities: Moves * 4 Neurologic: A and O * 3, good insight  DIAGNOSTIC DATA REVIEWED: BMET    Component Value Date/Time   NA 140 06/15/2024 1112   K 4.4 06/15/2024 1112   CL 101 06/15/2024 1112   CO2 22 06/15/2024 1112   GLUCOSE 108 (H) 06/15/2024 1112   GLUCOSE 138 (H) 05/29/2024 1545   BUN 17 06/15/2024 1112   CREATININE 1.01 06/15/2024 1112   CALCIUM  9.5 06/15/2024 1112   GFRNONAA >60 05/29/2024 1545   GFRAA 96 05/29/2020 1417   Lab Results  Component Value Date   HGBA1C 6.8 (H) 06/15/2024   HGBA1C 5.9 08/15/2013   Lab Results  Component Value Date   INSULIN  18.3 06/15/2024   INSULIN  21.9 09/08/2017   Lab Results  Component Value Date   TSH 1.430 08/19/2023   CBC    Component Value Date/Time   WBC 8.5 05/29/2024 1545   RBC 5.17 05/29/2024 1545   HGB 15.0 05/29/2024 1545   HGB 15.1 08/19/2023 1443   HCT 45.2 05/29/2024 1545   HCT 46.1 08/19/2023 1443   PLT 261 05/29/2024 1545   PLT 239 08/19/2023 1443   MCV 87.4 05/29/2024 1545   MCV 89 08/19/2023 1443   MCH 29.0 05/29/2024 1545   MCHC 33.2 05/29/2024 1545   RDW 13.4 05/29/2024 1545   RDW 13.1 08/19/2023 1443   Iron Studies No results found for: IRON, TIBC, FERRITIN, IRONPCTSAT Lipid Panel     Component Value Date/Time   CHOL 161 06/15/2024 1112   TRIG 306 (H) 06/15/2024 1112   HDL 48 06/15/2024 1112   CHOLHDL 3.4 06/15/2024 1112   CHOLHDL 4 11/03/2023 0810   VLDL 52.0 (H) 11/03/2023 0810   LDLCALC 65 06/15/2024 1112   LDLDIRECT 136.0 11/16/2016 0902    Hepatic Function Panel     Component Value Date/Time   PROT 7.1  06/15/2024 1112   ALBUMIN 4.5 06/15/2024 1112   AST 26 06/15/2024 1112   ALT 48 (H) 06/15/2024 1112   ALKPHOS 61 06/15/2024 1112   BILITOT 0.5 06/15/2024 1112   BILIDIR 0.1 05/29/2024 1615   IBILI 0.2 (L) 05/29/2024 1615      Component Value Date/Time   TSH 1.430 08/19/2023 1443   Nutritional Lab Results  Component Value Date   VD25OH 46.6 06/15/2024   VD25OH 61.5 08/19/2023   VD25OH 49.8 08/13/2022     Follow up:   Return 10/12/2024 at 11:40 AM with Verdon Parry D, MD.  He was informed of the importance of frequent follow up visits to maximize his success with intensive lifestyle modifications for his multiple health conditions.   Attestations:   I, Special Puri, acting as a Stage manager for [employer], DO., have compiled all relevant documentation for today's office visit on behalf of Bryan [name], DO, while in the presence of [employer], DO.  Pertinent positives were addressed with patient today. Reviewed by clinician on day of visit: allergies, medications, problem list, medical history, surgical history, family history, social history, and previous encounter notes.  I have reviewed the above documentation for accuracy and completeness, and I agree with the above. Bryan JINNY [name], D.O.  The 21st Century Cures Act was signed into law in 2016 which includes the topic of electronic health records.  This provides immediate access to information in MyChart. This includes consultation notes, operative notes, office notes, lab results and pathology reports.  If you have any questions about what you read please let us  know at your next visit so we can discuss your concerns and take corrective action if need be.  We are right here with you.

## 2024-09-29 ENCOUNTER — Ambulatory Visit: Payer: BC Managed Care – PPO | Admitting: Internal Medicine

## 2024-09-29 ENCOUNTER — Encounter: Payer: Self-pay | Admitting: Internal Medicine

## 2024-09-29 VITALS — BP 128/84 | HR 66 | Temp 98.0°F | Resp 16 | Ht 71.0 in | Wt 325.5 lb

## 2024-09-29 DIAGNOSIS — Z0000 Encounter for general adult medical examination without abnormal findings: Secondary | ICD-10-CM

## 2024-09-29 DIAGNOSIS — E119 Type 2 diabetes mellitus without complications: Secondary | ICD-10-CM

## 2024-09-29 DIAGNOSIS — Z6841 Body Mass Index (BMI) 40.0 and over, adult: Secondary | ICD-10-CM

## 2024-09-29 DIAGNOSIS — Z7985 Long-term (current) use of injectable non-insulin antidiabetic drugs: Secondary | ICD-10-CM

## 2024-09-29 DIAGNOSIS — R7989 Other specified abnormal findings of blood chemistry: Secondary | ICD-10-CM | POA: Diagnosis not present

## 2024-09-29 DIAGNOSIS — Z7984 Long term (current) use of oral hypoglycemic drugs: Secondary | ICD-10-CM

## 2024-09-29 LAB — MICROALBUMIN / CREATININE URINE RATIO
Creatinine,U: 235.7 mg/dL
Microalb Creat Ratio: 4.9 mg/g (ref 0.0–30.0)
Microalb, Ur: 1.2 mg/dL (ref 0.0–1.9)

## 2024-09-29 LAB — HEPATIC FUNCTION PANEL
ALT: 40 U/L (ref 0–53)
AST: 21 U/L (ref 0–37)
Albumin: 4.4 g/dL (ref 3.5–5.2)
Alkaline Phosphatase: 46 U/L (ref 39–117)
Bilirubin, Direct: 0.1 mg/dL (ref 0.0–0.3)
Total Bilirubin: 0.5 mg/dL (ref 0.2–1.2)
Total Protein: 6.9 g/dL (ref 6.0–8.3)

## 2024-09-29 LAB — PSA: PSA: 0.63 ng/mL (ref 0.10–4.00)

## 2024-09-29 NOTE — Assessment & Plan Note (Signed)
 Here for CPX -Td 09-2021 - s/p shingrex - PNM 23 : 2021 (h/o OSA, obesity) - Recommend flu and COVID booster.  Declined vaccines today -CCS : +FH Mat. Aunt had colon Ca Cscope  2004,  normal . cscope 2015- polyps, cscope 12/2022, nexr per GI  -Prostate ca screening: No symptoms, check PSA - diet, exercise: Seen at the wellness clinic, has been extensively educated regards diet -Labs reviewed.  Will get daily LFTs, PSA, micro

## 2024-09-29 NOTE — Assessment & Plan Note (Signed)
 Here for CPX  Other issues addressed today DM type II A1c increased from 6.4 to 6.8, indicating progression.  On metformin , just started Mounjaro  few weeks ago.  A1c to be checked at the wellness clinic. Morbid obesity: BMI 45, started Mounjaro  a few weeks ago, by the wellness clinic.  Has been extensively educated about diet.  Follow-up with them monthly. HTN: Blood pressure well-controlled at home, occasionally elevated during office visits. Current medications include Cardizem , losartan , metoprolol , and Maxzide .  Continue same meds, check ambulatory BPs. High cholesterol: Managed with atorvastatin . Last LDL was 65, within target range.  No change OSA: Managed with regular CPAP use.  Reports good compliance Vitamin D  deficiency Previously diagnosed, currently supplemented with vitamin D . Last levels were within normal range. RTC 6 to 8 months

## 2024-09-29 NOTE — Patient Instructions (Signed)
 GO TO THE LAB :  Get the blood work    Then, go to the front desk for the checkout  Continue checking your blood pressure regularly Blood pressure goal:  between 110/65 and  135/85. If it is consistently higher or lower, let me know      Please read more detailed instructions below     VISIT SUMMARY:   ADULT WELLNESS VISIT: Routine visit with no new concerns. Your colonoscopy is up to date with normal results, and there is no family history of prostate cancer. -We will check your PSA level. -Consider getting flu and COVID vaccinations at a pharmacy.  TYPE 2 DIABETES MELLITUS: Your A1c level has increased from 6.4 to 6.8, indicating some progression of your diabetes. -Continue taking Mounjaro  and metformin . -Your A1c will be checked at the wellness clinic.  OBESITY: Your weight management is ongoing with Mounjaro  and a diet plan. Your dosage was recently increased. -Continue with Mounjaro  and your diet plan. -Attend regular follow-ups at the wellness clinic.  HYPERTENSION: Your blood pressure is well-controlled at home but occasionally elevated during clinic visits. -Continue your current medications: Cardizem , losartan , metoprolol , and Maxzide . -Monitor your blood pressure at home regularly.  HYPERLIPIDEMIA: Your cholesterol is managed with atorvastatin , and your last LDL was 65, which is within the target range. -Continue taking atorvastatin .  OBSTRUCTIVE SLEEP APNEA: Your sleep apnea is managed with regular use of a CPAP machine. -Continue using your CPAP machine regularly.  VITAMIN D  DEFICIENCY: You were previously diagnosed with vitamin D  deficiency and are currently taking supplements. Your last levels were normal. -Continue taking your vitamin D  supplements.

## 2024-09-29 NOTE — Progress Notes (Signed)
 Subjective:    Patient ID: Bryan [name], male    DOB: Nov 30, 1961, 63 y.o.   MRN: [id number]  DOS:  09/29/2024 CPX  Discussed the use of AI scribe software for clinical note transcription with the patient, who gave verbal consent to proceed.  History of Present Illness  Hypertension - Managed with Cardizem , losartan , metoprolol , and Maxzide  - Blood pressure generally well-controlled at home - Elevated blood pressure during clinic visits - No chest pain, heart palpitations, or respiratory difficulties  Hyperlipidemia - Managed with atorvastatin  - Recent LDL of 65  Type 2 diabetes mellitus - Managed with Mounjaro  and metformin  - Recent increase in A1c from 6.4 to 6.8 - Recent adjustment in Mounjaro  dosage - Attends monthly wellness clinic for weight management  Obstructive sleep apnea - Uses CPAP machine regularly  General review of systems - No recent chest pain, respiratory difficulties, stomach or urinary problems, or heart palpitations    Review of Systems  Other than above, a 14 point review of systems is negative       Past Medical History:  Diagnosis Date   Allergy    Dental crowns present    Difficult intubation    Hyperlipidemia    Hypertension    Lower back pain    Nephrolithiasis 2017   Prediabetes 09/04/2014   Seasonal allergies    Sebaceous cyst 04/2013   upper back   Sleep apnea    uses CPAP nightly   Urolithiasis 10/2016    Past Surgical History:  Procedure Laterality Date   A-FLUTTER ABLATION N/A 01/19/2022   Procedure: A-FLUTTER ABLATION;  Surgeon: Cindie Ole DASEN, MD;  Location: [facility];  Service: Cardiovascular;  Laterality: N/A;   ADENOIDECTOMY     as a child   bone marrow donor     under anesthesia   CHOLECYSTECTOMY  01/12/2012   Procedure: LAPAROSCOPIC CHOLECYSTECTOMY WITH INTRAOPERATIVE CHOLANGIOGRAM;  Surgeon: Donnice POUR. Belinda, MD;  Location: [facility];  Service: General;  Laterality: N/A;   COLONOSCOPY      COLONOSCOPY WITH PROPOFOL  N/A 12/15/2022   Procedure: COLONOSCOPY WITH PROPOFOL ;  Surgeon: Abran Norleen SAILOR, MD;  Location: [facility];  Service: Gastroenterology;  Laterality: N/A;   CYST EXCISION     cyst removal from back   EAR CYST EXCISION N/A 05/04/2013   Procedure: Excision subcutaneous mass posterior neck;  Surgeon: Donnice POUR. Tsuei, MD;  Location: [facility];  Service: General;  Laterality: N/A;   POLYPECTOMY     POLYPECTOMY  12/15/2022   Procedure: POLYPECTOMY;  Surgeon: Abran Norleen SAILOR, MD;  Location: [facility];  Service: Gastroenterology;;   wisdom teeth      Current Outpatient Medications  Medication Instructions   aspirin EC 81 mg, Daily   atorvastatin  (LIPITOR) 40 mg, Oral, Daily at bedtime   azelastine  (ASTELIN ) 0.1 % nasal spray 2 sprays, Each Nare, At bedtime PRN, Use in each nostril as directed    b complex vitamins capsule 1 capsule, Daily   budesonide -formoterol  (SYMBICORT ) 160-4.5 MCG/ACT inhaler 2 puffs, Inhalation, 2 times daily PRN   buPROPion  (WELLBUTRIN  SR) 200 mg, Oral, 2 times daily   CO ENZYME Q-10 PO 1 capsule, Every morning   diltiazem  (CARDIZEM ) 30 MG tablet Take 1 - 2 tablets by mouth every 6 hours as needed for heart rate greater than 130   famotidine-calcium  carbonate-magnesium hydroxide (PEPCID COMPLETE) 10-800-165 MG chewable tablet 1 tablet, Daily PRN   fish oil-omega-3 fatty acids 1 g, Every morning  fluticasone  (FLONASE ) 50 MCG/ACT nasal spray 2 sprays, Each Nare, Daily   ibuprofen (ADVIL) 400 mg, Every 6 hours PRN   losartan  (COZAAR ) 100 mg, Oral, Daily   MELATONIN PO 1 tablet, Daily at bedtime   meloxicam  (MOBIC ) 15 MG tablet Take 1 tablet by mouth once a day as needed for pain   metFORMIN  (GLUCOPHAGE ) 500 mg, Oral, 3 times daily   metoprolol  succinate (TOPROL -XL) 25 MG 24 hr tablet TAKE 1 TABLET(25 MG) BY MOUTH AT BEDTIME   Mounjaro  5 mg, Subcutaneous, Weekly   Multiple Vitamins-Minerals (CENTRUM PO) 1 tablet, Every morning    omeprazole (PRILOSEC OTC) 20 mg, Daily PRN   triamterene -hydrochlorothiazide  (MAXZIDE -25) 37.5-25 MG tablet 1 tablet, Oral, Daily   Vitamin D  (Ergocalciferol ) (DRISDOL ) 50,000 Units, Oral, Every Sun       Objective:   Physical Exam BP 128/84   Pulse 66   Temp 98 F (36.7 C) (Oral)   Resp 16   Ht 5' 11 (1.803 m)   Wt (!) 325 lb 8 oz (147.6 kg)   SpO2 95%   BMI 45.40 kg/m  General: Well developed, NAD, BMI noted Neck: No  thyromegaly  HEENT:  Normocephalic . Face symmetric, atraumatic Lungs:  CTA B Normal respiratory effort, no intercostal retractions, no accessory muscle use. Heart: RRR,  no murmur.  Abdomen:  Not distended, soft, non-tender. No rebound or rigidity.   DM foot exam: No edema, good perfusion, pinprick examination normal Skin: Exposed areas without rash. Not pale. Not jaundice Neurologic:  alert & oriented X3.  Speech normal, gait appropriate for age and unassisted Strength symmetric and appropriate for age.  Psych: Cognition and judgment appear intact.  Cooperative with normal attention span and concentration.  Behavior appropriate. No anxious or depressed appearing.     Assessment   Assessment DM HTN Hyperlipidemia CV: Atrial flutter, ablation 01-2022 --> NSR.  Eliquis  discontinue June 2023. Insomnia: on OTCs Morbid obesity, BMI 46 OSA, CPAP Seasonal allergies Kidney stone (first) 10-2016 Vit D def dx 09/2017 Retina detachment DX 06-2019  Assessment & Plan Here for CPX -Td 09-2021 - s/p shingrex - PNM 23 : 2021 (h/o OSA, obesity) - Recommend flu and COVID booster.  Declined vaccines today -CCS : +FH Mat. Aunt had colon Ca Cscope  2004,  normal . cscope 2015- polyps, cscope 12/2022, nexr per GI  -Prostate ca screening: No symptoms, check PSA - diet, exercise: Seen at the wellness clinic, has been extensively educated regards diet -Labs reviewed.  Will get daily LFTs, PSA, micro  Other issues addressed today DM type II A1c increased  from 6.4 to 6.8, indicating progression.  On metformin , just started Mounjaro  few weeks ago.  A1c to be checked at the wellness clinic. Morbid obesity: BMI 45, started Mounjaro  a few weeks ago, by the wellness clinic.  Has been extensively educated about diet.  Follow-up with them monthly. HTN: Blood pressure well-controlled at home, occasionally elevated during office visits. Current medications include Cardizem , losartan , metoprolol , and Maxzide .  Continue same meds, check ambulatory BPs. High cholesterol: Managed with atorvastatin . Last LDL was 65, within target range.  No change OSA: Managed with regular CPAP use.  Reports good compliance Vitamin D  deficiency Previously diagnosed, currently supplemented with vitamin D . Last levels were within normal range. RTC 6 to 8 months

## 2024-09-30 NOTE — Progress Notes (Unsigned)
  Electrophysiology Office Note:   Date:  10/03/2024  ID:  Bryan [name], DOB [birth year], MRN [id number]  Primary Cardiologist: None Primary Heart Failure: None Electrophysiologist: OLE ONEIDA HOLTS, MD      History of Present Illness:   Bryan [name] is a 63 y.o. male with h/o AFL, RBBB, HTN, OSA on CPAP, obesity seen today for routine electrophysiology followup.   Since last being seen in our clinic the patient reports doing well. He suspects his prior shortness of breath were due to a visit to the beach with food and drinking dietary indiscretions. He has been on mounjaro  and is down 12 lbs overall.  He has not had further shortness of breath and is reassured by his recent negative work up.   He denies chest pain, palpitations, dyspnea, PND, orthopnea, nausea, vomiting, dizziness, syncope, edema, weight gain, or early satiety.   Review of systems complete and found to be negative unless listed in HPI.   EP Information / Studies Reviewed:    EKG is not ordered today. 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, LAFB      Arrhythmia / AAD / Pertinent EP Studies AFL > initial dx ~2022  EPS 01/19/22 > RF ablation of CTI for typical flutter    Risk Assessment/Calculations:              Physical Exam:   VS:  BP 130/80 (BP Location: Left Arm, Patient Position: Sitting)   Pulse 73   Ht 5' 11 (1.803 m)   Wt (!) 328 lb 3.2 oz (148.9 kg)   SpO2 98%   BMI 45.77 kg/m    Wt Readings from Last 3 Encounters:  10/03/24 (!) 328 lb 3.2 oz (148.9 kg)  09/29/24 (!) 325 lb 8 oz (147.6 kg)  09/13/24 (!) 323 lb (146.5 kg)     GEN: Well nourished, well developed in no acute distress NECK: No JVD; No carotid bruits CARDIAC: Regular rate and rhythm, no murmurs, rubs, gallops RESPIRATORY:  Clear to auscultation without rales, wheezing or rhonchi  ABDOMEN: Soft, non-tender, non-distended EXTREMITIES:  No edema; No deformity   ASSESSMENT AND PLAN:    Typical Atrial  Flutter S/p CTI ablation 2023  -no symptom burden  -monitors with an Apple Watch  -not on OAC as no recurrent AFL   Conduction System Disease: RBBB -monitor EKG over time  -discussed if he had ongoing dyspnea, we could consider a monitor > defer for now as he feels good / no further dyspnea  Hypertension  -well controlled on current regimen   OSA  -CPAP compliance encouraged   Dyspnea  -ECHO 07/2024 wnl  -do not see Pulmonary follow up since 08/2023 >   Obesity  -on Monunjaro, down 12lbs in 5 weeks  -discussed long term benefits of weight loss with patient    Follow up with EP APP in 12 months > plan to transition to Dr. Almetta as EP   Signed, Daphne Barrack, NP-C, AGACNP-BC [facility] - Electrophysiology  10/03/2024, 8:03 AM

## 2024-10-01 ENCOUNTER — Ambulatory Visit: Payer: Self-pay | Admitting: Internal Medicine

## 2024-10-03 ENCOUNTER — Ambulatory Visit: Attending: Physician Assistant | Admitting: Pulmonary Disease

## 2024-10-03 ENCOUNTER — Encounter: Payer: Self-pay | Admitting: Pulmonary Disease

## 2024-10-03 VITALS — BP 130/80 | HR 73 | Ht 71.0 in | Wt 328.2 lb

## 2024-10-03 DIAGNOSIS — I10 Essential (primary) hypertension: Secondary | ICD-10-CM | POA: Diagnosis not present

## 2024-10-03 DIAGNOSIS — I4510 Unspecified right bundle-branch block: Secondary | ICD-10-CM

## 2024-10-03 DIAGNOSIS — I483 Typical atrial flutter: Secondary | ICD-10-CM | POA: Diagnosis not present

## 2024-10-03 DIAGNOSIS — R0600 Dyspnea, unspecified: Secondary | ICD-10-CM | POA: Diagnosis not present

## 2024-10-03 NOTE — Patient Instructions (Signed)
 Medication Instructions:  Your physician recommends that you continue on your current medications as directed. Please refer to the Current Medication list given to you today.  *If you need a refill on your cardiac medications before your next appointment, please call your pharmacy*  Lab Work: None ordered If you have labs (blood work) drawn today and your tests are completely normal, you will receive your results only by: MyChart Message (if you have MyChart) OR A paper copy in the mail If you have any lab test that is abnormal or we need to change your treatment, we will call you to review the results.  Follow-Up: At [facility], you and your health needs are our priority.  As part of our continuing mission to provide you with exceptional heart care, our providers are all part of one team.  This team includes your primary Cardiologist (physician) and Advanced Practice Providers or APPs (Physician Assistants and Nurse Practitioners) who all work together to provide you with the care you need, when you need it.  Your next appointment:   1 year(s)  Provider:   Donnice Primus, MD

## 2024-10-12 ENCOUNTER — Encounter (INDEPENDENT_AMBULATORY_CARE_PROVIDER_SITE_OTHER): Payer: Self-pay | Admitting: Family Medicine

## 2024-10-12 ENCOUNTER — Other Ambulatory Visit (HOSPITAL_COMMUNITY): Payer: Self-pay

## 2024-10-12 ENCOUNTER — Ambulatory Visit (INDEPENDENT_AMBULATORY_CARE_PROVIDER_SITE_OTHER): Payer: Self-pay | Admitting: Family Medicine

## 2024-10-12 VITALS — BP 121/73 | HR 71 | Temp 97.7°F | Ht 71.0 in | Wt 319.0 lb

## 2024-10-12 DIAGNOSIS — E1169 Type 2 diabetes mellitus with other specified complication: Secondary | ICD-10-CM

## 2024-10-12 DIAGNOSIS — E669 Obesity, unspecified: Secondary | ICD-10-CM

## 2024-10-12 DIAGNOSIS — E119 Type 2 diabetes mellitus without complications: Secondary | ICD-10-CM

## 2024-10-12 DIAGNOSIS — E559 Vitamin D deficiency, unspecified: Secondary | ICD-10-CM

## 2024-10-12 DIAGNOSIS — Z7985 Long-term (current) use of injectable non-insulin antidiabetic drugs: Secondary | ICD-10-CM

## 2024-10-12 DIAGNOSIS — Z6841 Body Mass Index (BMI) 40.0 and over, adult: Secondary | ICD-10-CM

## 2024-10-12 DIAGNOSIS — Z7984 Long term (current) use of oral hypoglycemic drugs: Secondary | ICD-10-CM

## 2024-10-12 DIAGNOSIS — E7849 Other hyperlipidemia: Secondary | ICD-10-CM

## 2024-10-12 DIAGNOSIS — E785 Hyperlipidemia, unspecified: Secondary | ICD-10-CM | POA: Diagnosis not present

## 2024-10-12 MED ORDER — METFORMIN HCL 500 MG PO TABS
500.0000 mg | ORAL_TABLET | Freq: Three times a day (TID) | ORAL | 0 refills | Status: DC
  Filled 2024-10-12: qty 270, 90d supply, fill #0

## 2024-10-12 MED ORDER — TIRZEPATIDE 5 MG/0.5ML ~~LOC~~ SOAJ
5.0000 mg | SUBCUTANEOUS | 0 refills | Status: DC
  Filled 2024-10-12: qty 2, 28d supply, fill #0

## 2024-10-12 NOTE — Progress Notes (Signed)
 Office: [phone]  /  Fax: [phone]  WEIGHT SUMMARY AND BIOMETRICS  Anthropometric Measurements Height: 5' 11 (1.803 m) Weight: (!) 319 lb (144.7 kg) BMI (Calculated): 44.51 Weight at Last Visit: 323 lb Weight Lost Since Last Visit: 4 lb Weight Gained Since Last Visit: 0 Starting Weight: 326 lb Total Weight Loss (lbs): 7 lb (3.175 kg) Peak Weight: 336 lb   Body Composition  Body Fat %: 40.5 % Fat Mass (lbs): 129.4 lbs Muscle Mass (lbs): 181 lbs Total Body Water (lbs): 135.4 lbs Visceral Fat Rating : 28   Other Clinical Data Fasting: yes Labs: yes Today's Visit #: 101 Starting Date: 09/08/17    Chief Complaint: OBESITY   History of Present Illness Bryan [name] is a 63 year old male with obesity and type two diabetes who presents for obesity treatment and progress assessment.  He is adhering to a category three eating plan approximately seventy percent of the time, focusing on consuming the recommended amount of protein and increasing his intake of fruits and vegetables. He maintains adequate hydration and does not skip meals. He generally sleeps seven to nine hours per night and exercises for sixty minutes four days a week, including cardio, elliptical, weights, and treadmill workouts. He has lost four pounds in the last month since his last visit.  In managing type two diabetes, he is currently taking metformin  500 mg three times a day and Mounjaro  5 mg once a week. He has not experienced any problems with his medications and requests refills. There have been no issues with insurance coverage for his medications.  He discusses his dietary preferences and challenges, noting that candy is not a temptation, but salty snacks like chips are more challenging. He is planning for upcoming events like Thanksgiving and a scientist, research (life sciences), strategizing to manage his diet by focusing on protein options and avoiding high-calorie snacks. He mentions bringing apples and  lean meat sticks as snacks to avoid unhealthy options.  He has increased his physical activity, meeting with a friend at a club to use cardio equipment and weights. He has been focusing on core exercises following physical therapy for a back injury, which was muscular in nature. The physical therapy helped him learn to engage his core properly during workouts.      PHYSICAL EXAM:  Blood pressure 121/73, pulse 71, temperature 97.7 F (36.5 C), height 5' 11 (1.803 m), weight (!) 319 lb (144.7 kg), SpO2 95%. Body mass index is 44.49 kg/m.  DIAGNOSTIC DATA REVIEWED:  BMET    Component Value Date/Time   NA 140 06/15/2024 1112   K 4.4 06/15/2024 1112   CL 101 06/15/2024 1112   CO2 22 06/15/2024 1112   GLUCOSE 108 (H) 06/15/2024 1112   GLUCOSE 138 (H) 05/29/2024 1545   BUN 17 06/15/2024 1112   CREATININE 1.01 06/15/2024 1112   CALCIUM  9.5 06/15/2024 1112   GFRNONAA >60 05/29/2024 1545   GFRAA 96 05/29/2020 1417   Lab Results  Component Value Date   HGBA1C 6.8 (H) 06/15/2024   HGBA1C 5.9 08/15/2013   Lab Results  Component Value Date   INSULIN  18.3 06/15/2024   INSULIN  21.9 09/08/2017   Lab Results  Component Value Date   TSH 1.430 08/19/2023   CBC    Component Value Date/Time   WBC 8.5 05/29/2024 1545   RBC 5.17 05/29/2024 1545   HGB 15.0 05/29/2024 1545   HGB 15.1 08/19/2023 1443   HCT 45.2 05/29/2024 1545   HCT 46.1  08/19/2023 1443   PLT 261 05/29/2024 1545   PLT 239 08/19/2023 1443   MCV 87.4 05/29/2024 1545   MCV 89 08/19/2023 1443   MCH 29.0 05/29/2024 1545   MCHC 33.2 05/29/2024 1545   RDW 13.4 05/29/2024 1545   RDW 13.1 08/19/2023 1443   Iron Studies No results found for: IRON, TIBC, FERRITIN, IRONPCTSAT Lipid Panel     Component Value Date/Time   CHOL 161 06/15/2024 1112   TRIG 306 (H) 06/15/2024 1112   HDL 48 06/15/2024 1112   CHOLHDL 3.4 06/15/2024 1112   CHOLHDL 4 11/03/2023 0810   VLDL 52.0 (H) 11/03/2023 0810   LDLCALC 65  06/15/2024 1112   LDLDIRECT 136.0 11/16/2016 0902   Hepatic Function Panel     Component Value Date/Time   PROT 6.9 09/29/2024 0855   PROT 7.1 06/15/2024 1112   ALBUMIN 4.4 09/29/2024 0855   ALBUMIN 4.5 06/15/2024 1112   AST 21 09/29/2024 0855   ALT 40 09/29/2024 0855   ALKPHOS 46 09/29/2024 0855   BILITOT 0.5 09/29/2024 0855   BILITOT 0.5 06/15/2024 1112   BILIDIR 0.1 09/29/2024 0855   IBILI 0.2 (L) 05/29/2024 1615      Component Value Date/Time   TSH 1.430 08/19/2023 1443   Nutritional Lab Results  Component Value Date   VD25OH 46.6 06/15/2024   VD25OH 61.5 08/19/2023   VD25OH 49.8 08/13/2022     Assessment and Plan Assessment & Plan Obesity Management is ongoing with a category three eating plan, followed 70% of the time. He is consuming adequate protein, fruits, and vegetables, and maintaining hydration. He exercises 60 minutes four days a week, including cardio, elliptical, weights, and treadmill. He has lost four pounds in the last month, with half of the weight loss attributed to water and half to fat. He is managing holiday challenges with dietary strategies and is aware of the importance of not overexerting during exercise. - Continue category three eating plan - Continue current exercise regimen - Monitor weight and body composition  Type 2 diabetes mellitus Type 2 diabetes is managed with metformin  500 mg three times a day and Mounjaro  5 mg once a week. He reports no issues with medications and is experiencing reduced excessive hunger, aiding in weight loss. There is no current need to increase Mounjaro  dosage as he is managing well without blood sugar or nausea issues. He is aware of the potential risks of increasing Mounjaro  too much, such as curbing appropriate hunger. - Continue metformin  500 mg three times a day - Continue Mounjaro  5 mg once a week - Ordered labs to check A1c and other relevant parameters  Hyperlipidemia Management is ongoing with dietary  and exercise interventions. He is working on diet, exercise, and weight loss to manage hyperlipidemia. - Continue dietary and exercise interventions  Vitamin D  deficiency Management is ongoing with vitamin D  supplementation. He will reassess the need for continued replacement after lab results. - Continue vitamin D  supplementation - Ordered labs to assess vitamin D  levels     Bryan [name] was counseled on the importance of maintaining healthy lifestyle habits, including balanced nutrition, regular physical activity, and behavioral modifications, while taking antiobesity medication.  Patient verbalized understanding that medication is an adjunct to, not a replacement for, lifestyle changes and that the long-term success and weight maintenance depend on continued adherence to these strategies.   Bryan [name] was informed of the importance of frequent follow up visits to maximize his success with intensive lifestyle modifications for his obesity and  obesity related health conditions as recommended by USPSTF and CMS guidelines   Louann Penton, MD

## 2024-10-13 LAB — CMP14+EGFR
ALT: 63 IU/L — ABNORMAL HIGH (ref 0–44)
AST: 29 IU/L (ref 0–40)
Albumin: 4.5 g/dL (ref 3.9–4.9)
Alkaline Phosphatase: 66 IU/L (ref 47–123)
BUN/Creatinine Ratio: 14 (ref 10–24)
BUN: 14 mg/dL (ref 8–27)
Bilirubin Total: 0.6 mg/dL (ref 0.0–1.2)
CO2: 23 mmol/L (ref 20–29)
Calcium: 9.6 mg/dL (ref 8.6–10.2)
Chloride: 98 mmol/L (ref 96–106)
Creatinine, Ser: 1.03 mg/dL (ref 0.76–1.27)
Globulin, Total: 2.9 g/dL (ref 1.5–4.5)
Glucose: 91 mg/dL (ref 70–99)
Potassium: 4.2 mmol/L (ref 3.5–5.2)
Sodium: 136 mmol/L (ref 134–144)
Total Protein: 7.4 g/dL (ref 6.0–8.5)
eGFR: 83 mL/min/1.73 (ref >59)

## 2024-10-13 LAB — LIPID PANEL WITH LDL/HDL RATIO
Cholesterol, Total: 126 mg/dL (ref 100–199)
HDL: 44 mg/dL (ref >39)
LDL Chol Calc (NIH): 59 mg/dL (ref 0–99)
LDL/HDL Ratio: 1.3 ratio (ref 0.0–3.6)
Triglycerides: 133 mg/dL (ref 0–149)
VLDL Cholesterol Cal: 23 mg/dL (ref 5–40)

## 2024-10-13 LAB — VITAMIN B12: Vitamin B-12: 519 pg/mL (ref 232–1245)

## 2024-10-13 LAB — VITAMIN D 25 HYDROXY (VIT D DEFICIENCY, FRACTURES): Vit D, 25-Hydroxy: 69.6 ng/mL (ref 30.0–100.0)

## 2024-10-13 LAB — HEMOGLOBIN A1C
Est. average glucose Bld gHb Est-mCnc: 134 mg/dL
Hgb A1c MFr Bld: 6.3 % — ABNORMAL HIGH (ref 4.8–5.6)

## 2024-10-13 LAB — INSULIN, RANDOM: INSULIN: 18.7 u[IU]/mL (ref 2.6–24.9)

## 2024-11-14 ENCOUNTER — Other Ambulatory Visit (HOSPITAL_COMMUNITY): Payer: Self-pay

## 2024-11-14 ENCOUNTER — Ambulatory Visit (INDEPENDENT_AMBULATORY_CARE_PROVIDER_SITE_OTHER): Payer: Self-pay | Admitting: Family Medicine

## 2024-11-14 ENCOUNTER — Encounter (INDEPENDENT_AMBULATORY_CARE_PROVIDER_SITE_OTHER): Payer: Self-pay | Admitting: Family Medicine

## 2024-11-14 DIAGNOSIS — I152 Hypertension secondary to endocrine disorders: Secondary | ICD-10-CM

## 2024-11-14 DIAGNOSIS — E1169 Type 2 diabetes mellitus with other specified complication: Secondary | ICD-10-CM

## 2024-11-14 DIAGNOSIS — Z6841 Body Mass Index (BMI) 40.0 and over, adult: Secondary | ICD-10-CM

## 2024-11-14 DIAGNOSIS — E559 Vitamin D deficiency, unspecified: Secondary | ICD-10-CM

## 2024-11-14 MED ORDER — TIRZEPATIDE 7.5 MG/0.5ML ~~LOC~~ SOAJ
7.5000 mg | SUBCUTANEOUS | 0 refills | Status: DC
  Filled 2024-11-14: qty 2, 28d supply, fill #0

## 2024-11-14 NOTE — Progress Notes (Signed)
 Bryan DOROTHA [name], D.O.  ABFM, ABOM Specializing in Clinical Bariatric Medicine  Office located at: [address] [place], [place]  [address]      A) FOR THE CHRONIC DISEASE OF OBESITY:  Chief complaint: Obesity Bryan [name] is here to discuss his progress with his obesity treatment plan.   History of present illness / Interval history:  Bryan [name] is here today for his follow-up office visit.  Since last OV on 10/12/24, pt is up 3 lbs. Patient states that he has been very busy recently and he had a wedding this weekend. He reports that he hosted Thanksgiving for 35 people. He states that he has been working out more consistently.     10/12/24 11:00 11/14/24   Body Fat % 40.5 % 40.8 %  Muscle Mass (lbs) 181 lbs 181.6 lbs  Fat Mass (lbs) 129.4 lbs 131.6 lbs  Total Body Water (lbs) 135.4 lbs 138.6 lbs  Visceral Fat Rating  28 28   Counseling done on how various foods will affect these numbers and how to maximize success.   Total lbs lost to date: - 4 lbs Total Fat Mass in lbs lost to date: + 4.2 lbs Total weight loss percentage to date: - 1.23 %    Obesity, starting BMI 46.78 Obesity with current BMI 44.93  Nutrition Therapy He is on the Category 3 Plan and states he is following his eating plan approximately 50 % of the time.   - Tracking Calories/Macros: no   - Eating More Whole Foods: yes  - Adequate Protein Intake: no   - Adequate Water Intake: yes  - Skipping Meals: no   - Sleeping 7-9 Hours/ Night: yes   Bryan [name] is currently in the action stage of change. As such, his goal is to continue weight management plan.  He has agreed to: continue current plan   Physical Activity Bryan [name] is doing weight or the elliptical 45  minutes 5 days per week   Bryan [name] has been advised to work up to 300-450 minutes of moderate intensity aerobic activity a week and strengthening exercises 2-3 times per week for cardiovascular health, weight loss maintenance and  preservation of muscle mass.  He has agreed to : Continue current level of physical activity    Behavioral Modifications Evidence-based interventions for health behavior change were utilized today including the discussion of   1) SMART goals for next OV:    - Drink 1 gallon of water a day   Regarding patient's less desirable eating habits and patterns, we employed the technique of small changes.   We discussed the following today: increasing lean protein intake to established goals and continue to work on maintaining a reduced calorie state, getting the recommended amount of protein, incorporating whole foods, making healthy choices, staying well hydrated and practicing mindfulness when eating. Additional resources provided today: None   Medical Interventions/ Pharmacotherapy Previous Bariatric surgery: n/a Pharmacotherapy for weight loss: He is currently taking Metformin  500 mg three times a day, Wellbutrin  SR 200 mg twice daily and Mounjaro  5 mg once weekly for medical weight loss.    We discussed various medication options to help Bryan [name] with his weight loss efforts and we both agreed to : Increase Mounjaro  to 7.5 mg once weekly   B) OBESITY RELATED CONDITIONS ADDRESSED TODAY:  Type 2 diabetes mellitus with other specified complication, without long-term current use of insulin  [facility]) Assessment & Plan Lab Results  Component Value Date   HGBA1C 6.3 (H) 10/12/2024  HGBA1C 6.8 (H) 06/15/2024   HGBA1C 6.4 (H) 08/19/2023   INSULIN  18.7 10/12/2024   INSULIN  18.3 06/15/2024   INSULIN  15.1 08/19/2023    On Metformin  500 mg three times daily and Mounjaro  5 mg once weekly. With reported good compliance and tolerance. Patient states that he   Denies any GI upset  States he undertands that eating off plan will increase side effects( denies constipatiom)  States that he has good control of hunger an cravings   Mutually agreed to Increase Mounjaro  to 7.5 mg once weekly   Vitamin D   deficiency Assessment & Plan Lab Results  Component Value Date   VD25OH 69.6 10/12/2024   VD25OH 46.6 06/15/2024   VD25OH 61.5 08/19/2023   Taking ERGO 50K units once weekly with reported good compliance and tolerance. Patient states that he has no issue remembering to take the supplementation. Continue supplementation. Will recheck levels in 3-4 months.       Hypertension associated with type 2 diabetes mellitus [facility]) Assessment & Plan BP Readings from Last 3 Encounters:  11/14/24 138/80  10/12/24 121/73  10/03/24 130/80   The ASCVD Risk score (Arnett DK, et al., 2019) failed to calculate for the following reasons:   The valid total cholesterol range is 130 to 320 mg/dL  Lab Results  Component Value Date   CREATININE 1.03 10/12/2024   On Cozaar  100 mg daily, Toprol -XL 25 mg daily and Maxzide  - 25 daily. With reported good compliance and tolerance. BP today is not at goal - slightly elevated. Patient states that he has been checking his BP at home and it runs in the low 130s over 70s. Stressed to him the importance of having at goal BP of around 120s over 80s. Continue with medications and following prudent meal plan.        There are no discontinued medications.   No orders of the defined types were placed in this encounter.     Follow up:   Return 12/14/2024 at 2:20 PM  He was informed of the importance of frequent follow up visits to maximize his success with intensive lifestyle modifications for his multiple health conditions.   Weight Summary and Biometrics   Weight Lost Since Last Visit: 0lb  Weight Gained Since Last Visit: 3lb  ***  Vitals Temp: 98.1 F (36.7 C) BP: 138/80 Pulse Rate: 72 SpO2: 98 %   Anthropometric Measurements Height: 5' 11 (1.803 m) Weight: (!) 322 lb (146.1 kg) BMI (Calculated): 44.93 Weight at Last Visit: 319lb Weight Lost Since Last Visit: 0lb Weight Gained Since Last Visit: 3lb Starting Weight: 326lb Total Weight Loss  (lbs): 4 lb (1.814 kg) Peak Weight: 336lb   Body Composition  Body Fat %: 40.8 % Fat Mass (lbs): 131.6 lbs Muscle Mass (lbs): 181.6 lbs Total Body Water (lbs): 138.6 lbs Visceral Fat Rating : 28   Other Clinical Data Fasting: no Labs: no Today's Visit #: 102 Starting Date: 09/08/17    Objective:   PHYSICAL EXAM: Blood pressure 138/80, pulse 72, temperature 98.1 F (36.7 C), height 5' 11 (1.803 m), weight (!) 322 lb (146.1 kg), SpO2 98%. Body mass index is 44.91 kg/m.  General: he is overweight, cooperative and in no acute distress. PSYCH: Has normal mood, affect and thought process.   HEENT: EOMI, sclerae are anicteric. Lungs: Normal breathing effort, no conversational dyspnea. Extremities: Moves * 4 Neurologic: A and O * 3, good insight  DIAGNOSTIC DATA REVIEWED: BMET    Component Value Date/Time   NA  136 10/12/2024 1212   K 4.2 10/12/2024 1212   CL 98 10/12/2024 1212   CO2 23 10/12/2024 1212   GLUCOSE 91 10/12/2024 1212   GLUCOSE 138 (H) 05/29/2024 1545   BUN 14 10/12/2024 1212   CREATININE 1.03 10/12/2024 1212   CALCIUM  9.6 10/12/2024 1212   GFRNONAA >60 05/29/2024 1545   GFRAA 96 05/29/2020 1417   Lab Results  Component Value Date   HGBA1C 6.3 (H) 10/12/2024   HGBA1C 5.9 08/15/2013   Lab Results  Component Value Date   INSULIN  18.7 10/12/2024   INSULIN  21.9 09/08/2017   Lab Results  Component Value Date   TSH 1.430 08/19/2023   CBC    Component Value Date/Time   WBC 8.5 05/29/2024 1545   RBC 5.17 05/29/2024 1545   HGB 15.0 05/29/2024 1545   HGB 15.1 08/19/2023 1443   HCT 45.2 05/29/2024 1545   HCT 46.1 08/19/2023 1443   PLT 261 05/29/2024 1545   PLT 239 08/19/2023 1443   MCV 87.4 05/29/2024 1545   MCV 89 08/19/2023 1443   MCH 29.0 05/29/2024 1545   MCHC 33.2 05/29/2024 1545   RDW 13.4 05/29/2024 1545   RDW 13.1 08/19/2023 1443   Iron Studies No results found for: IRON, TIBC, FERRITIN, IRONPCTSAT Lipid Panel      Component Value Date/Time   CHOL 126 10/12/2024 1212   TRIG 133 10/12/2024 1212   HDL 44 10/12/2024 1212   CHOLHDL 3.4 06/15/2024 1112   CHOLHDL 4 11/03/2023 0810   VLDL 52.0 (H) 11/03/2023 0810   LDLCALC 59 10/12/2024 1212   LDLDIRECT 136.0 11/16/2016 0902   Hepatic Function Panel     Component Value Date/Time   PROT 7.4 10/12/2024 1212   ALBUMIN 4.5 10/12/2024 1212   AST 29 10/12/2024 1212   ALT 63 (H) 10/12/2024 1212   ALKPHOS 66 10/12/2024 1212   BILITOT 0.6 10/12/2024 1212   BILIDIR 0.1 09/29/2024 0855   IBILI 0.2 (L) 05/29/2024 1615      Component Value Date/Time   TSH 1.430 08/19/2023 1443   Nutritional Lab Results  Component Value Date   VD25OH 69.6 10/12/2024   VD25OH 46.6 06/15/2024   VD25OH 61.5 08/19/2023    Attestations:   I, ***, acting as a stage manager for Bryan [name], DO., have compiled all relevant documentation for today's office visit on behalf of Bryan [name], DO, while in the presence of [employer], DO.  I have spent *** minutes in the care of the patient today including *** minutes face-to-face assessing and reviewing listed medical problems above as outlined in office visit note and providing nutritional and behavioral counseling as outlined in obesity care plan.   I have reviewed the above documentation for accuracy and completeness, and I agree with the above. Bryan [name], D.O.  The 21st Century Cures Act was signed into law in 2016 which includes the topic of electronic health records.  This provides immediate access to information in MyChart.  This includes consultation notes, operative notes, office notes, lab results and pathology reports.  If you have any questions about what you read please let us  know at your next visit so we can discuss your concerns and take corrective action if need be.  We are right here with you.

## 2024-11-16 ENCOUNTER — Ambulatory Visit (INDEPENDENT_AMBULATORY_CARE_PROVIDER_SITE_OTHER): Admitting: Family Medicine

## 2024-12-11 ENCOUNTER — Other Ambulatory Visit: Payer: Self-pay | Admitting: Cardiology

## 2024-12-13 MED ORDER — METOPROLOL SUCCINATE ER 25 MG PO TB24: ORAL_TABLET | ORAL | 3 refills | Status: AC

## 2024-12-14 ENCOUNTER — Telehealth (INDEPENDENT_AMBULATORY_CARE_PROVIDER_SITE_OTHER): Admitting: Family Medicine

## 2024-12-14 ENCOUNTER — Other Ambulatory Visit: Payer: Self-pay

## 2024-12-14 ENCOUNTER — Encounter (INDEPENDENT_AMBULATORY_CARE_PROVIDER_SITE_OTHER): Payer: Self-pay | Admitting: *Deleted

## 2024-12-14 ENCOUNTER — Encounter (INDEPENDENT_AMBULATORY_CARE_PROVIDER_SITE_OTHER): Payer: Self-pay | Admitting: Family Medicine

## 2024-12-14 ENCOUNTER — Other Ambulatory Visit (HOSPITAL_COMMUNITY): Payer: Self-pay

## 2024-12-14 VITALS — Ht 71.0 in

## 2024-12-14 DIAGNOSIS — G4709 Other insomnia: Secondary | ICD-10-CM | POA: Diagnosis not present

## 2024-12-14 DIAGNOSIS — Z6841 Body Mass Index (BMI) 40.0 and over, adult: Secondary | ICD-10-CM | POA: Diagnosis not present

## 2024-12-14 DIAGNOSIS — E1169 Type 2 diabetes mellitus with other specified complication: Secondary | ICD-10-CM | POA: Diagnosis not present

## 2024-12-14 DIAGNOSIS — Z7985 Long-term (current) use of injectable non-insulin antidiabetic drugs: Secondary | ICD-10-CM | POA: Diagnosis not present

## 2024-12-14 DIAGNOSIS — Z7984 Long term (current) use of oral hypoglycemic drugs: Secondary | ICD-10-CM | POA: Diagnosis not present

## 2024-12-14 DIAGNOSIS — G479 Sleep disorder, unspecified: Secondary | ICD-10-CM

## 2024-12-14 MED ORDER — METFORMIN HCL 500 MG PO TABS
500.0000 mg | ORAL_TABLET | Freq: Three times a day (TID) | ORAL | 0 refills | Status: AC
  Filled 2024-12-14: qty 270, 90d supply, fill #0

## 2024-12-14 MED ORDER — TIRZEPATIDE 7.5 MG/0.5ML ~~LOC~~ SOAJ
7.5000 mg | SUBCUTANEOUS | 0 refills | Status: DC
  Filled 2024-12-14: qty 2, 28d supply, fill #0

## 2024-12-14 NOTE — Progress Notes (Signed)
 "  Office: [phone]  /  Fax: [phone]  WEIGHT SUMMARY AND BIOMETRICS  Anthropometric Measurements Height: 5' 11 (1.803 m) Weight: -- (Virtual Visit) Weight at Last Visit: 319 lb Starting Weight: 326 lb Peak Weight: 336 lb   No data recorded Other Clinical Data Fasting: N/A Labs: N/A Today's Visit #: 103 Starting Date: 09/08/17 Comments: Mychart Video Visit    Chief Complaint: OBESITY  Virtual Visit via A/V Note  I connected with Bryan [name] on 12/14/2024 at  2:20 PM EST by audiovisual telehealth and verified that I am speaking with the correct person using two identifiers.  Location: Patient: work Provider: home   I discussed the limitations, risks, security and privacy concerns of performing an evaluation and management service by AV telehealth and the availability of in person appointments. I also discussed with the patient that there may be a patient responsible charge related to this service. The patient expressed understanding and agreed to proceed.     History of Present Illness Bryan [name] is a 64 year old male with obesity and type 2 diabetes who presents for a follow-up on his treatment plan and progress.  He adheres to a category three eating plan approximately 80% of the time and exercises most days of the week for 45 minutes, including elliptical and weight training. He has maintained his weight over the holidays and feels his excessive hunger is well controlled. He is working on adequate hydration and consuming the recommended amount of protein without skipping meals.  His type 2 diabetes is managed with diet, exercise, weight loss, lifestyle modifications, metformin , and Wegovy . His most recent hemoglobin A1c was 6.3 in November of last year. No issues with his current medications, metformin  and Mounjaro , and his hunger is well controlled. No significant changes in appetite due to exercise.  He experiences fair sleep quality, noting  difficulty returning to sleep if he wakes up in the middle of the night, often after about five hours of sleep. He has a history of sleep apnea and uses a CPAP machine. He has tried over-the-counter sleep aids and gabapentin, which helped him sleep but is concerned about potential side effects, including weight gain. He is currently getting about 100 ounces of water daily and is trying to manage his sleep better.  He has started journaling as part of a six-month self-evaluation, which includes aspects of his diet and exercise regimen. He feels he is in a good routine and is motivated to maintain his current pace.      PHYSICAL EXAM:  Height 5' 11 (1.803 m). Body mass index is 44.91 kg/m.  DIAGNOSTIC DATA REVIEWED:  BMET    Component Value Date/Time   NA 136 10/12/2024 1212   K 4.2 10/12/2024 1212   CL 98 10/12/2024 1212   CO2 23 10/12/2024 1212   GLUCOSE 91 10/12/2024 1212   GLUCOSE 138 (H) 05/29/2024 1545   BUN 14 10/12/2024 1212   CREATININE 1.03 10/12/2024 1212   CALCIUM  9.6 10/12/2024 1212   GFRNONAA >60 05/29/2024 1545   GFRAA 96 05/29/2020 1417   Lab Results  Component Value Date   HGBA1C 6.3 (H) 10/12/2024   HGBA1C 5.9 08/15/2013   Lab Results  Component Value Date   INSULIN  18.7 10/12/2024   INSULIN  21.9 09/08/2017   Lab Results  Component Value Date   TSH 1.430 08/19/2023   CBC    Component Value Date/Time   WBC 8.5 05/29/2024 1545   RBC 5.17 05/29/2024 1545  HGB 15.0 05/29/2024 1545   HGB 15.1 08/19/2023 1443   HCT 45.2 05/29/2024 1545   HCT 46.1 08/19/2023 1443   PLT 261 05/29/2024 1545   PLT 239 08/19/2023 1443   MCV 87.4 05/29/2024 1545   MCV 89 08/19/2023 1443   MCH 29.0 05/29/2024 1545   MCHC 33.2 05/29/2024 1545   RDW 13.4 05/29/2024 1545   RDW 13.1 08/19/2023 1443   Iron Studies No results found for: IRON, TIBC, FERRITIN, IRONPCTSAT Lipid Panel     Component Value Date/Time   CHOL 126 10/12/2024 1212   TRIG 133 10/12/2024  1212   HDL 44 10/12/2024 1212   CHOLHDL 3.4 06/15/2024 1112   CHOLHDL 4 11/03/2023 0810   VLDL 52.0 (H) 11/03/2023 0810   LDLCALC 59 10/12/2024 1212   LDLDIRECT 136.0 11/16/2016 0902   Hepatic Function Panel     Component Value Date/Time   PROT 7.4 10/12/2024 1212   ALBUMIN 4.5 10/12/2024 1212   AST 29 10/12/2024 1212   ALT 63 (H) 10/12/2024 1212   ALKPHOS 66 10/12/2024 1212   BILITOT 0.6 10/12/2024 1212   BILIDIR 0.1 09/29/2024 0855   IBILI 0.2 (L) 05/29/2024 1615      Component Value Date/Time   TSH 1.430 08/19/2023 1443   Nutritional Lab Results  Component Value Date   VD25OH 69.6 10/12/2024   VD25OH 46.6 06/15/2024   VD25OH 61.5 08/19/2023     Assessment and Plan Assessment & Plan Morbid obesity Managed with a category three eating plan, regular exercise, and medication. He reports adherence to the eating plan 80% of the time and exercises most days for 45 minutes, including elliptical and weights. He has maintained his weight over the holidays and has lost a little weight recently. Excessive hunger is well controlled, and he feels his hunger is appropriate. He is not skipping meals and is hydrating adequately. - Continue category three eating plan - Continue regular exercise regimen - Refilled metformin  and Mounjaro  prescriptions  Type 2 diabetes mellitus with other specified complication Type 2 diabetes is managed with diet, exercise, weight loss, lifestyle modifications, metformin , and Wegovy . His most recent hemoglobin A1c was 6.3, indicating good control. - Continue current diabetes management plan - Refilled metformin  and Mounjaro  prescriptions - Continue diet, exercise and weight loss as discussed today as an important part of the treatment plan   Sleep maintenance insomnia Characterized by difficulty returning to sleep after waking up in the middle of the night. He reports getting approximately five hours of sleep before waking up and finds it hard to go  back to sleep. He is considering magnesium gluconate as a potential remedy for sleep maintenance. Gabapentin was discussed as a potential aid, but concerns about weight gain were noted. - Consider magnesium gluconate for sleep maintenance - Explore non-pharmacological strategies for sleep maintenance, such as mental relaxation techniques      Patients who are on anti-obesity medications are counseled on the importance of maintaining healthy lifestyle habits, including balanced nutrition, regular physical activity, and behavioral modifications,  Medication is an adjunct to, not a replacement for, lifestyle changes and that the long-term success and weight maintenance depend on continued adherence to these strategies.   Bryan [name] was informed of the importance of frequent follow up visits to maximize his success with intensive lifestyle modifications for his obesity and obesity related health conditions as recommended by USPSTF and CMS guidelines   Louann Penton, MD   "

## 2024-12-17 ENCOUNTER — Encounter (INDEPENDENT_AMBULATORY_CARE_PROVIDER_SITE_OTHER): Payer: Self-pay | Admitting: Family Medicine

## 2024-12-17 ENCOUNTER — Other Ambulatory Visit (INDEPENDENT_AMBULATORY_CARE_PROVIDER_SITE_OTHER): Payer: Self-pay | Admitting: Family Medicine

## 2024-12-17 DIAGNOSIS — E559 Vitamin D deficiency, unspecified: Secondary | ICD-10-CM

## 2024-12-17 DIAGNOSIS — E1159 Type 2 diabetes mellitus with other circulatory complications: Secondary | ICD-10-CM

## 2024-12-17 DIAGNOSIS — F3289 Other specified depressive episodes: Secondary | ICD-10-CM

## 2024-12-18 ENCOUNTER — Other Ambulatory Visit: Payer: Self-pay

## 2024-12-18 ENCOUNTER — Other Ambulatory Visit (HOSPITAL_COMMUNITY): Payer: Self-pay

## 2024-12-18 MED ORDER — BUPROPION HCL ER (SR) 200 MG PO TB12
200.0000 mg | ORAL_TABLET | Freq: Two times a day (BID) | ORAL | 0 refills | Status: AC
  Filled 2024-12-18: qty 180, 90d supply, fill #0

## 2024-12-18 MED ORDER — TRIAMTERENE-HCTZ 37.5-25 MG PO TABS
1.0000 | ORAL_TABLET | Freq: Every day | ORAL | 0 refills | Status: AC
  Filled 2024-12-18: qty 90, 90d supply, fill #0

## 2024-12-18 MED ORDER — LOSARTAN POTASSIUM 100 MG PO TABS
100.0000 mg | ORAL_TABLET | Freq: Every day | ORAL | 0 refills | Status: AC
  Filled 2024-12-18: qty 90, 90d supply, fill #0

## 2024-12-18 MED ORDER — VITAMIN D (ERGOCALCIFEROL) 1.25 MG (50000 UNIT) PO CAPS
50000.0000 [IU] | ORAL_CAPSULE | ORAL | 0 refills | Status: AC
  Filled 2024-12-18: qty 12, 84d supply, fill #0

## 2024-12-18 NOTE — Telephone Encounter (Signed)
 LAST APPOINTMENT DATE: 12/14/2024 NEXT APPOINTMENT DATE: 01/11/2025   [place] [facility] [address] [place] [address] Phone: [phone] Fax: [phone]  Patient is requesting a refill of the following medications: Requested Prescriptions   Pending Prescriptions Disp Refills   buPROPion  (WELLBUTRIN  SR) 200 MG 12 hr tablet 180 tablet 0    Sig: Take 1 tablet (200 mg total) by mouth 2 (two) times daily.   losartan  (COZAAR ) 100 MG tablet 90 tablet 0    Sig: Take 1 tablet (100 mg total) by mouth daily.   triamterene -hydrochlorothiazide  (MAXZIDE -25) 37.5-25 MG tablet 90 tablet 0    Sig: Take 1 tablet by mouth daily.   Vitamin D , Ergocalciferol , (DRISDOL ) 1.25 MG (50000 UNIT) CAPS capsule 12 capsule 0    Sig: Take 1 capsule (50,000 Units total) by mouth every Sunday.    Date last filled: 09/13/2024 Previously prescribed by Dr Midge  Lab Results  Component Value Date   HGBA1C 6.3 (H) 10/12/2024   HGBA1C 6.8 (H) 06/15/2024   HGBA1C 6.4 (H) 08/19/2023   Lab Results  Component Value Date   MICROALBUR 1.2 09/29/2024   LDLCALC 59 10/12/2024   CREATININE 1.03 10/12/2024   Lab Results  Component Value Date   VD25OH 69.6 10/12/2024   VD25OH 46.6 06/15/2024   VD25OH 61.5 08/19/2023    BP Readings from Last 3 Encounters:  11/14/24 138/80  10/12/24 121/73  10/03/24 130/80

## 2024-12-18 NOTE — Telephone Encounter (Signed)
 Patient had a my chart video visit last week and did not get the refills needed.

## 2024-12-19 ENCOUNTER — Ambulatory Visit (INDEPENDENT_AMBULATORY_CARE_PROVIDER_SITE_OTHER): Admitting: Family Medicine

## 2025-01-06 ENCOUNTER — Other Ambulatory Visit: Payer: Self-pay | Admitting: Internal Medicine

## 2025-01-11 ENCOUNTER — Encounter (INDEPENDENT_AMBULATORY_CARE_PROVIDER_SITE_OTHER): Payer: Self-pay | Admitting: Family Medicine

## 2025-01-11 ENCOUNTER — Ambulatory Visit (INDEPENDENT_AMBULATORY_CARE_PROVIDER_SITE_OTHER): Admitting: Family Medicine

## 2025-01-11 ENCOUNTER — Other Ambulatory Visit (HOSPITAL_COMMUNITY): Payer: Self-pay

## 2025-01-11 DIAGNOSIS — E1169 Type 2 diabetes mellitus with other specified complication: Secondary | ICD-10-CM

## 2025-01-11 DIAGNOSIS — E559 Vitamin D deficiency, unspecified: Secondary | ICD-10-CM

## 2025-01-11 DIAGNOSIS — E66813 Obesity, class 3: Secondary | ICD-10-CM

## 2025-01-11 MED ORDER — TIRZEPATIDE 10 MG/0.5ML ~~LOC~~ SOAJ
10.0000 mg | SUBCUTANEOUS | 1 refills | Status: AC
  Filled 2025-01-11: qty 2, 28d supply, fill #0

## 2025-01-11 NOTE — Progress Notes (Incomplete)
 "  Bryan [name], D.O.  ABFM, ABOM Specializing in Clinical Bariatric Medicine  Office located at: [address] [place], [place]  [address]     Medications Discontinued During This Encounter  Medication Reason   tirzepatide  (MOUNJARO ) 7.5 MG/0.5ML Pen      Meds ordered this encounter  Medications   tirzepatide  (MOUNJARO ) 10 MG/0.5ML Pen    Sig: Inject 10 mg into the skin once a week.    Dispense:  2 mL    Refill:  1      FOR THE CHRONIC DISEASE OF OBESITY:  Chief complaint: Obesity Bryan [name] is here to discuss his progress with his obesity treatment plan.   History of present illness / Interval history:  Bryan [name] is here today for his follow-up office visit.  Since last OV on 12/14/24 with provider: Dr.Beasley, patient good adherence to reduced calorie nutritional plan.  Pt has been struggling with:  []  meal planning and prepping []  exercise []  sleep []  stressors []  mood []  chronic or acute medical conditions-  []  eating out more [x]  Nothing, doing great  Pt has been working on and improving their:  []  meal planning and prepping [x]  exercise []  sleep hygiene [x]  water intake []  strategies to better manage personal stressors []  strategies to better manage mood []  eating out less []  Nothing particular at this time  When asked by CMA prior to our office visit today, pt states they have been:  - Focused on eating fresh, unprocessed foods?  Yes   - Focused on eating lean proteins with each meal?  Yes   - Sleeping 7-9 Hours/ Night?  Yes   - Skipping Meals?  No   - States he is following his healthy eating plan approximately 50 % of the time.   Recent weight loss data history   01/11/25 09:00   Body Fat % 39.8 %  Muscle Mass (lbs) 179.4 lbs  Fat Mass (lbs) 124.6 lbs  Total Body Water (lbs) 132.8 lbs  Visceral Fat Rating  27    Counseling done on how various foods/ behaviors will affect these numbers and how to maximize weight loss success  discussed today in detail based on these findings. Total lbs lost to date: - 13 lbs Total Fat Mass in lbs lost to date: - 2.8 lbs Total weight loss percentage to date since starting program:  - 3.99 %    Obesity, Beginnning BMI 46.78 Obesity with current BMI 43.67  Physician directed Nutrition Therapy prescription: He is on the Category 3 Plan.    Bryan [name] is currently in the action stage of change. As such, his goal is to begin journaling.  Detailed, physician-directed nutritional counseling was provided, including: [] Discussed meal prep companies. Ie. [employer], [employer], [employer], [employer] etc [x] Discussed strategies for meal planning and prepping at home [] Discussed supermarket healthy prepared meal options- ie. [employer] natural meals or [employer] Prepared meals    He has agreed to: keep a food journal with a target of  1800-1900 calories per day and 120+.    Physician directed Behavioral Modification prescription: Evidence-based interventions for healthy behavior change were utilized today including the discussion of small changes and SMART goals.   We discussed the following today: increasing lean protein intake to established goals, increasing water intake , decreasing eating out or consumption of processed foods, and making healthy choices when eating convenient foods, planning for success, and staying on track while traveling and vacationing    Physician directed Physical  Activity prescription: Dick is doing bootcamp 60 minutes 2 days per week and  elliptical 20 minutes 3 days per week.   Kaire has been educated on how weight bearing exercises can help prevent age related bone loss, and especially so with resistance training, how work-out buddies and/ or fitness trackers can help motivate you to exercise and help keep you on track, and Ideal Heart Rate for Fat Burn During Cardio  Exercise prescription: He has agreed to Combine aerobic and strengthening  exercises for efficiency and improved cardiometabolic health.    Medical Interventions/ Pharmacotherapy  Previously tried medical weight loss medications/ therapies: None  Pharmacotherapy for weight loss: He is currently taking Metformin  500 mg TID, Mounjaro  7.5 weekly and Wellbutrin  SR 200 mg BID for medical weight loss.     We discussed various medication options to help Bryan [name] with his weight loss efforts and we both agreed to:  Increase dose of Mounjaro  to 10 mg mg due to cravings and food noise   SPECIFIC behavorial / nutritional / exercise goals for next office visit:  Incorporate cardio with a heart rate of 98-110 for at least 45 min    B) OBESITY RELATED CONDITIONS ADDRESSED TODAY:  Type 2 diabetes mellitus with other specified complication, without long-term current use of insulin  [facility]) Assessment & Plan Lab Results  Component Value Date   HGBA1C 6.3 (H) 10/12/2024   HGBA1C 6.8 (H) 06/15/2024   HGBA1C 6.4 (H) 08/19/2023   INSULIN  18.7 10/12/2024   INSULIN  18.3 06/15/2024   INSULIN  15.1 08/19/2023    On Mounjaro  7.5 mg weekly and Metformin  500 mg TID with reported good compliance. Patient states that he switched taking the Mounjaro  to every Thursday to help get him through the weekends. He has been on the 7.5 mg dose since December. He reports having some occasional GI side effects but not enough for him to discontinue use. He reports the it might be due to him eating off plan but it has only happened about twice in the last month. He reports having some food noise. Mutually agreed to Increase dose to 10 mg weekly. Will reassess at next OV. Continue following prudent meal plan and decreasing simple carbs and sugars     Vitamin D  deficiency Assessment & Plan  Lab Results  Component Value Date   VD25OH 69.6 10/12/2024   VD25OH 46.6 06/15/2024   VD25OH 61.5 08/19/2023   Taking ERGO 50k units once weekly with reported good compliance and tolerance. No acute concerns.  Patient states that he has no issue remembering to take supplementation. Continue with supplementation. Will obtain labs in the near future.     Follow up:   Return 02/14/2025 at 2:20 PM   He was informed of the importance of frequent follow up visits to maximize his success with intensive lifestyle modifications for his multiple health conditions.   Weight Summary and Biometrics   Weight Lost Since Last Visit: plb  Weight Gained Since Last Visit: 0lb   Vitals Temp: 98 F (36.7 C) BP: 126/79 Pulse Rate: 70 SpO2: 97 %   Anthropometric Measurements Height: 5' 11 (1.803 m) Weight: (!) 313 lb (142 kg) BMI (Calculated): 43.67 Weight at Last Visit: 322lb Weight Lost Since Last Visit: plb Weight Gained Since Last Visit: 0lb Starting Weight: 326lb Total Weight Loss (lbs): 13 lb (5.897 kg) Peak Weight: 336lb   Body Composition  Body Fat %: 39.8 % Fat Mass (lbs): 124.6 lbs Muscle Mass (lbs): 179.4 lbs Total Body Water (lbs): 132.8  lbs Visceral Fat Rating : 27   Other Clinical Data Fasting: no Labs: no Today's Visit #: 104 Starting Date: 09/08/17    Objective:   PHYSICAL EXAM: Blood pressure 126/79, pulse 70, temperature 98 F (36.7 C), height 5' 11 (1.803 m), weight (!) 313 lb (142 kg), SpO2 97%. Body mass index is 43.65 kg/m. General: he is overweight, cooperative and in no acute distress. PSYCH: Has normal mood, affect and thought process.   HEENT: EOMI, sclerae are anicteric. Lungs: Normal breathing effort, no conversational dyspnea. Extremities: Moves * 4 Neurologic: A and O * 3, good insight  DIAGNOSTIC DATA REVIEWED: BMET    Component Value Date/Time   NA 136 10/12/2024 1212   K 4.2 10/12/2024 1212   CL 98 10/12/2024 1212   CO2 23 10/12/2024 1212   GLUCOSE 91 10/12/2024 1212   GLUCOSE 138 (H) 05/29/2024 1545   BUN 14 10/12/2024 1212   CREATININE 1.03 10/12/2024 1212   CALCIUM  9.6 10/12/2024 1212   GFRNONAA >60 05/29/2024 1545   GFRAA 96  05/29/2020 1417   Lab Results  Component Value Date   HGBA1C 6.3 (H) 10/12/2024   HGBA1C 5.9 08/15/2013   Lab Results  Component Value Date   INSULIN  18.7 10/12/2024   INSULIN  21.9 09/08/2017   Lab Results  Component Value Date   TSH 1.430 08/19/2023   CBC    Component Value Date/Time   WBC 8.5 05/29/2024 1545   RBC 5.17 05/29/2024 1545   HGB 15.0 05/29/2024 1545   HGB 15.1 08/19/2023 1443   HCT 45.2 05/29/2024 1545   HCT 46.1 08/19/2023 1443   PLT 261 05/29/2024 1545   PLT 239 08/19/2023 1443   MCV 87.4 05/29/2024 1545   MCV 89 08/19/2023 1443   MCH 29.0 05/29/2024 1545   MCHC 33.2 05/29/2024 1545   RDW 13.4 05/29/2024 1545   RDW 13.1 08/19/2023 1443   Iron Studies No results found for: IRON, TIBC, FERRITIN, IRONPCTSAT Lipid Panel     Component Value Date/Time   CHOL 126 10/12/2024 1212   TRIG 133 10/12/2024 1212   HDL 44 10/12/2024 1212   CHOLHDL 3.4 06/15/2024 1112   CHOLHDL 4 11/03/2023 0810   VLDL 52.0 (H) 11/03/2023 0810   LDLCALC 59 10/12/2024 1212   LDLDIRECT 136.0 11/16/2016 0902   Hepatic Function Panel     Component Value Date/Time   PROT 7.4 10/12/2024 1212   ALBUMIN 4.5 10/12/2024 1212   AST 29 10/12/2024 1212   ALT 63 (H) 10/12/2024 1212   ALKPHOS 66 10/12/2024 1212   BILITOT 0.6 10/12/2024 1212   BILIDIR 0.1 09/29/2024 0855   IBILI 0.2 (L) 05/29/2024 1615      Component Value Date/Time   TSH 1.430 08/19/2023 1443   Nutritional Lab Results  Component Value Date   VD25OH 69.6 10/12/2024   VD25OH 46.6 06/15/2024   VD25OH 61.5 08/19/2023    Attestations:   I, Sonny Laroche, acting as a stage manager for Bryan [name], DO., have compiled all relevant documentation for today's office visit on behalf of Bryan [name], DO, while in the presence of [employer], DO.  I have reviewed the above documentation for accuracy and completeness, and I agree with the above. Bryan JINNY [name], D.O.  The 21st Century Cures Act  was signed into law in 2016 which includes the topic of electronic health records.  This provides immediate access to information in MyChart.  This includes consultation notes, operative notes, office notes, lab results and pathology  reports.  If you have any questions about what you read please let us  know at your next visit so we can discuss your concerns and take corrective action if need be.  We are right here with you.  "

## 2025-02-08 ENCOUNTER — Ambulatory Visit (INDEPENDENT_AMBULATORY_CARE_PROVIDER_SITE_OTHER): Admitting: Family Medicine

## 2025-02-14 ENCOUNTER — Ambulatory Visit (INDEPENDENT_AMBULATORY_CARE_PROVIDER_SITE_OTHER): Admitting: Family Medicine

## 2025-03-14 ENCOUNTER — Ambulatory Visit (INDEPENDENT_AMBULATORY_CARE_PROVIDER_SITE_OTHER): Admitting: Family Medicine

## 2025-05-30 ENCOUNTER — Ambulatory Visit: Admitting: Internal Medicine
